# Patient Record
Sex: Female | Born: 1955 | Race: Black or African American | Hispanic: No | Smoking: Never smoker
Health system: Southern US, Community
[De-identification: ages and names within clinical notes are randomized; demographics above are authoritative.]

## PROBLEM LIST (undated history)

## (undated) DIAGNOSIS — R0600 Dyspnea, unspecified: Secondary | ICD-10-CM

## (undated) DIAGNOSIS — G9341 Metabolic encephalopathy: Secondary | ICD-10-CM

## (undated) DIAGNOSIS — I10 Essential (primary) hypertension: Secondary | ICD-10-CM

## (undated) DIAGNOSIS — R7989 Other specified abnormal findings of blood chemistry: Secondary | ICD-10-CM

## (undated) DIAGNOSIS — H269 Unspecified cataract: Secondary | ICD-10-CM

## (undated) DIAGNOSIS — J189 Pneumonia, unspecified organism: Secondary | ICD-10-CM

## (undated) DIAGNOSIS — E872 Acidosis, unspecified: Secondary | ICD-10-CM

## (undated) DIAGNOSIS — K219 Gastro-esophageal reflux disease without esophagitis: Secondary | ICD-10-CM

## (undated) DIAGNOSIS — F419 Anxiety disorder, unspecified: Secondary | ICD-10-CM

## (undated) DIAGNOSIS — R778 Other specified abnormalities of plasma proteins: Secondary | ICD-10-CM

## (undated) DIAGNOSIS — M1990 Unspecified osteoarthritis, unspecified site: Secondary | ICD-10-CM

## (undated) DIAGNOSIS — R195 Other fecal abnormalities: Secondary | ICD-10-CM

## (undated) DIAGNOSIS — I2699 Other pulmonary embolism without acute cor pulmonale: Secondary | ICD-10-CM

## (undated) DIAGNOSIS — E785 Hyperlipidemia, unspecified: Secondary | ICD-10-CM

## (undated) DIAGNOSIS — Z923 Personal history of irradiation: Secondary | ICD-10-CM

## (undated) DIAGNOSIS — I82419 Acute embolism and thrombosis of unspecified femoral vein: Secondary | ICD-10-CM

## (undated) HISTORY — PX: TUBAL LIGATION: SHX77

## (undated) HISTORY — PX: BREAST SURGERY: SHX581

## (undated) HISTORY — DX: Hyperlipidemia, unspecified: E78.5

## (undated) HISTORY — PX: TONSILLECTOMY: SUR1361

## (undated) HISTORY — PX: BREAST CYST EXCISION: SHX579

## (undated) HISTORY — DX: Unspecified osteoarthritis, unspecified site: M19.90

## (undated) HISTORY — DX: Other fecal abnormalities: R19.5

## (undated) HISTORY — DX: Anxiety disorder, unspecified: F41.9

## (undated) HISTORY — DX: Essential (primary) hypertension: I10

## (undated) HISTORY — DX: Gastro-esophageal reflux disease without esophagitis: K21.9

## (undated) HISTORY — PX: OTHER SURGICAL HISTORY: SHX169

---

## 1999-06-28 ENCOUNTER — Emergency Department (HOSPITAL_COMMUNITY): Admission: EM | Admit: 1999-06-28 | Discharge: 1999-06-28 | Payer: Self-pay | Admitting: Emergency Medicine

## 2000-07-11 ENCOUNTER — Emergency Department (HOSPITAL_COMMUNITY): Admission: EM | Admit: 2000-07-11 | Discharge: 2000-07-11 | Payer: Self-pay | Admitting: Emergency Medicine

## 2004-02-03 ENCOUNTER — Emergency Department (HOSPITAL_COMMUNITY): Admission: EM | Admit: 2004-02-03 | Discharge: 2004-02-03 | Payer: Self-pay | Admitting: Emergency Medicine

## 2004-02-03 IMAGING — CR DG CHEST 2V
2 series · 2 of 2 positions shown · non-contrast
Comparison: none

CLINICAL DATA: Tachycardia, lightheadedness. 
 TWO VIEW CHEST
 PA and lateral views of the chest show the heart, lungs, bony thorax and soft tissues to be within the normal limits.  There is minimal left apical pleural thickening.  
 IMPRESSION
 No evidence of acute disease.

[view not recorded (1 of 2)]
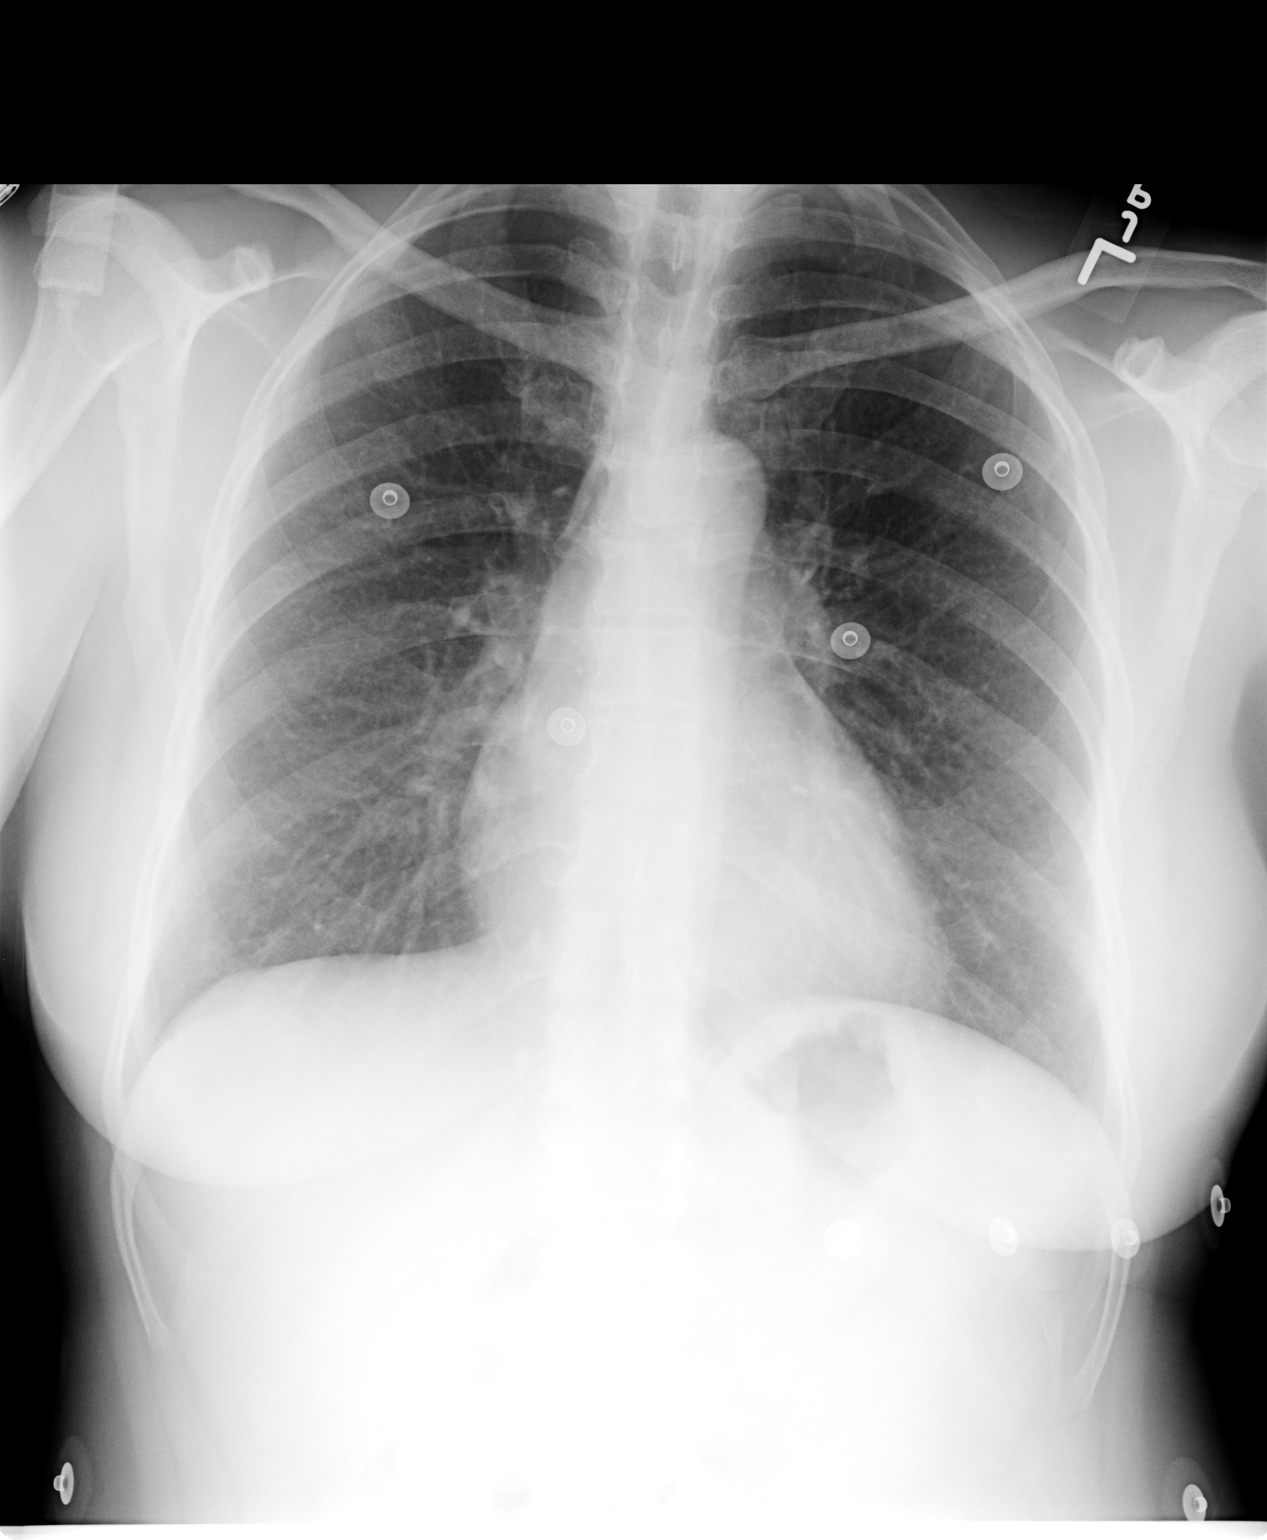

[view not recorded (2 of 2)]
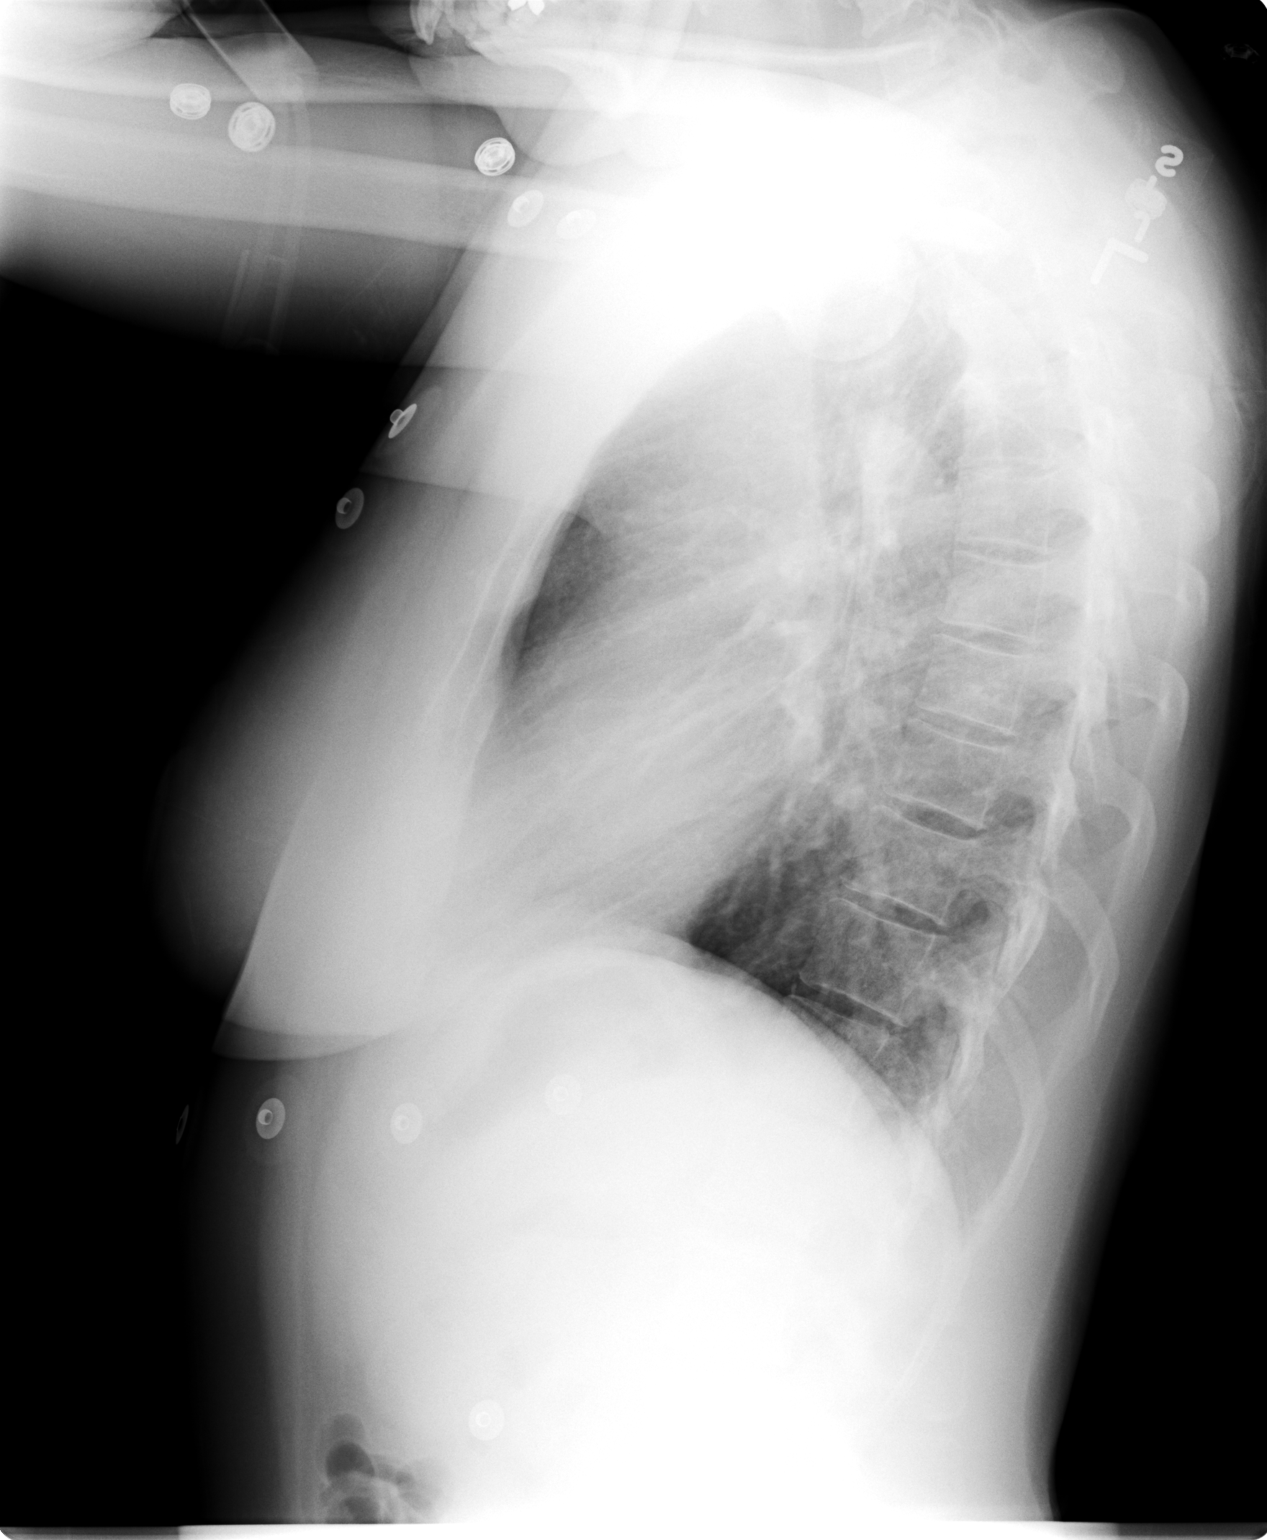

[2 of 2 positions shown; findings below may reference images not displayed]

## 2004-02-03 IMAGING — CT CT CHEST W/ CM
2 of 4 series · 14 of 30 positions shown, 16 images · IV contrast (agent unspecified)
Comparison: Chest radiographs earlier today.

CLINICAL DATA: Chest pain, sweating, nausea, and tachycardia.  
 CHEST CT WITH CONTRAST

[Series 3: *don't forget:recon (date) offon · axial · 0.78mm/px · z∈[-494,-30]mm · 6 of 137 slices shown, 8 images (1 of 2)]
[im 15/137  mediastinal]
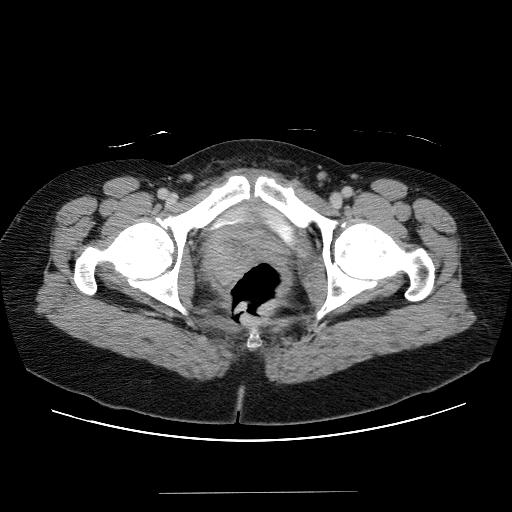
[im 15/137  lung]
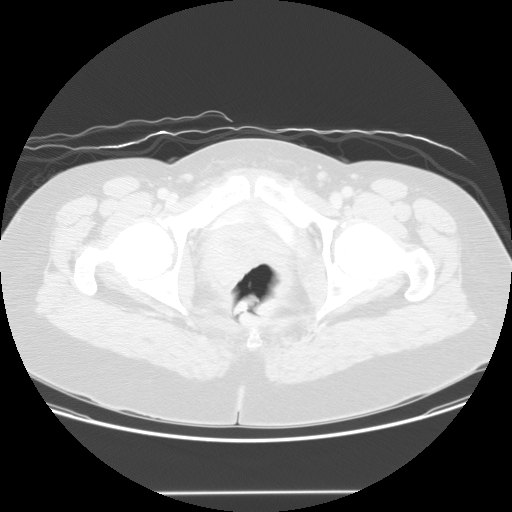
[im 23/137  lung]
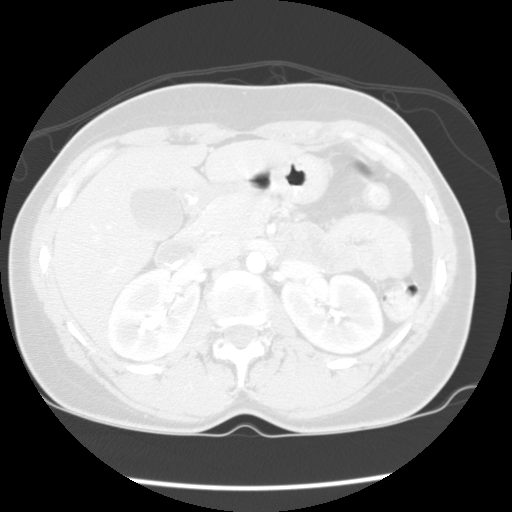
[im 46/137  lung]
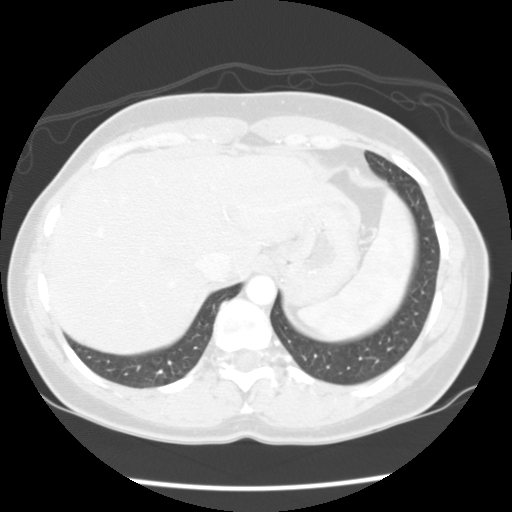
[im 69/137  lung]
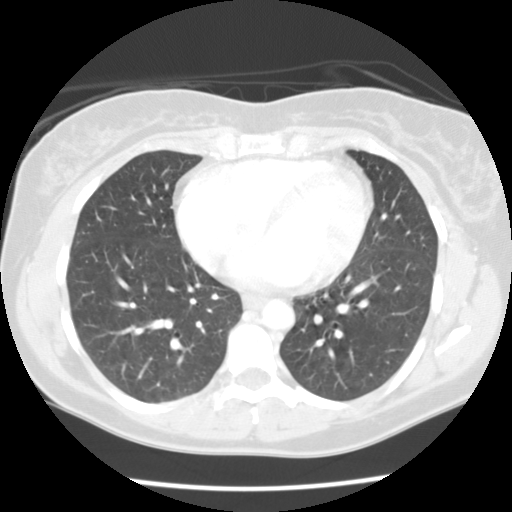
[im 91/137  mediastinal]
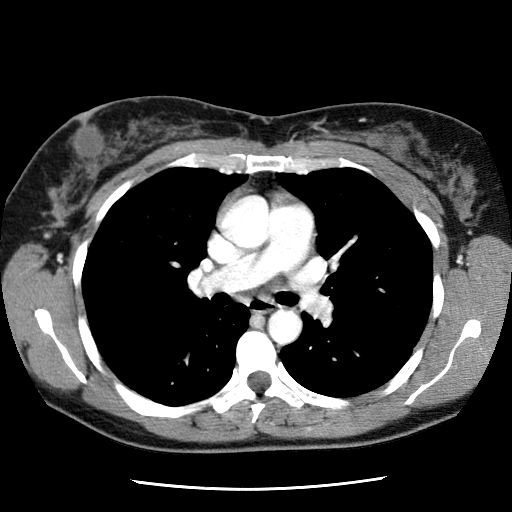
[im 91/137  lung]
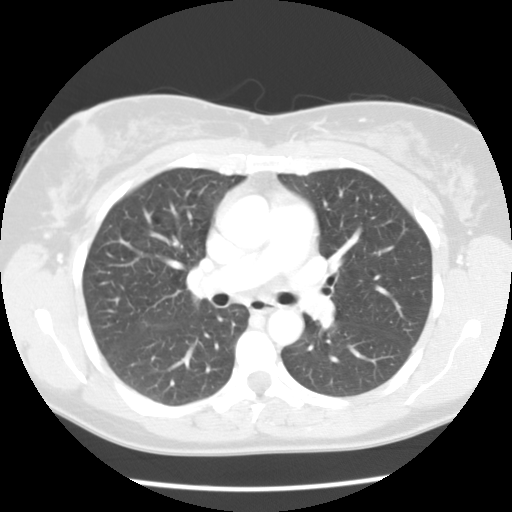
[im 114/137  lung]
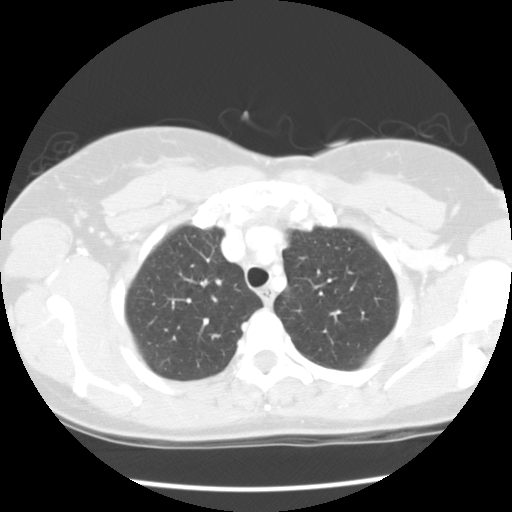

[Series 103: *don't forget:recon (date) offon · axial · 0.62mm/px · z∈[-232,-8]mm · 8 of 353 slices shown (2 of 2)]
[im 42/353  lung]
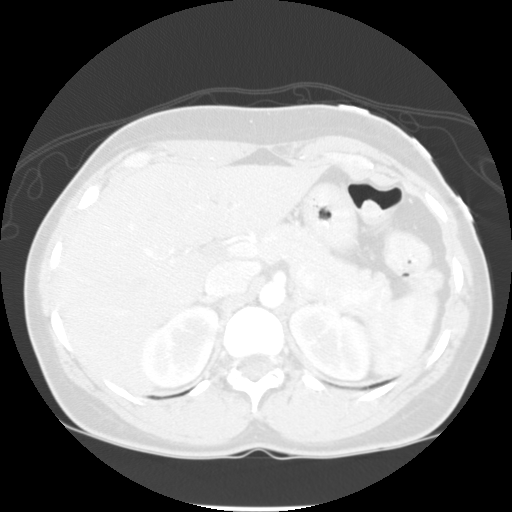
[im 83/353  lung]
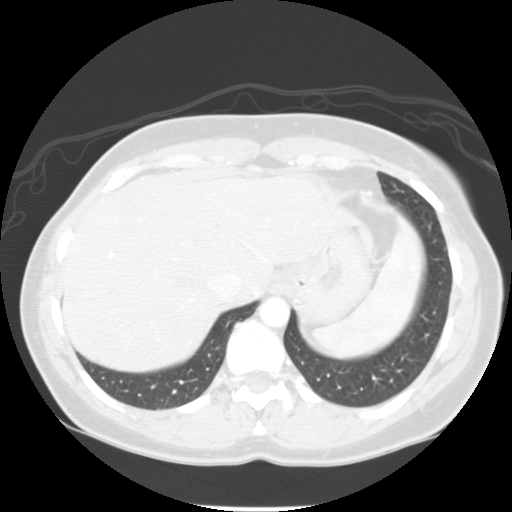
[im 125/353  lung]
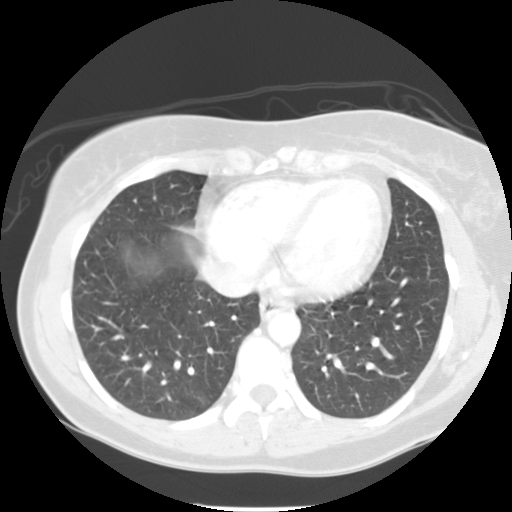
[im 166/353  lung]
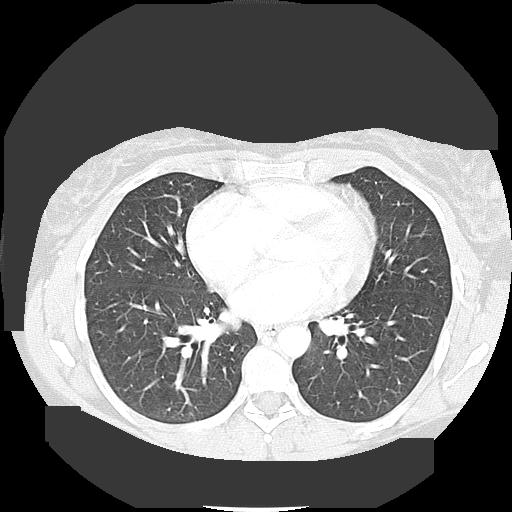
[im 187/353  lung]
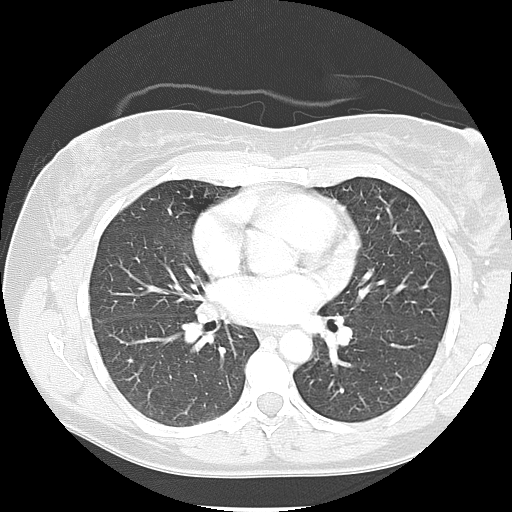
[im 228/353  lung]
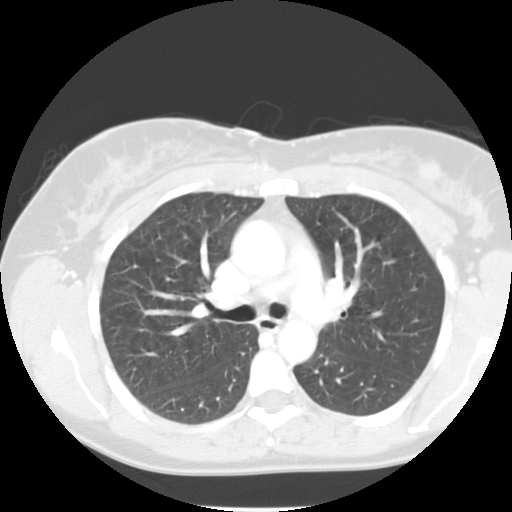
[im 270/353  lung]
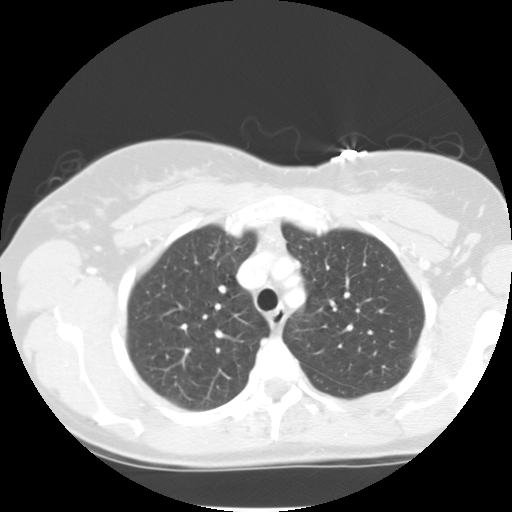
[im 311/353  lung]
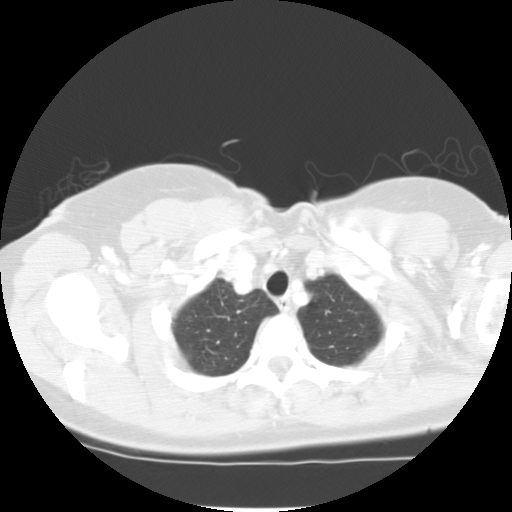

[14 of 30 positions shown; findings below may reference images not displayed]

A pulmonary embolism protocol was requested and performed with 150 cc of Omnipaque 300 intravenous contrast.  The pulmonary arteries are normally opacified with no pulmonary arterial filling defects seen.  Minimal diffuse accentuation of the interstitial markings.  Multiple rounded and oval masses in both breasts.  Some of these appear to represent cysts and others appear to represent solid, enhancing masses.  The largest on the left is in the upper inner aspect of the breast and measures 2.3 cm in maximum diameter.  The largest on the right is in a lateral retroareolar location and measures 2.5 cm in maximum diameter.  Mildly enlarged and mildly inhomogeneous thyroid gland containing a 1.0 cm oval low density area on the left.  Unremarkable bones.  No enlarged lymph nodes. 
 IMPRESSION 
 1.  No pulmonary emboli seen. 
 2.  Mild chronic interstitial lung disease. 
 3.  Multiple cystic and solid masses in both breasts.  Mammographic and ultrasound correlation is necessary. 
 4.  Mild multinodular goiter. 
 BILATERAL LIMITED   LOWER EXTREMITY CT WITH CONTRAST 
 Following the chest CT, representative transaxial images were obtained from below the knees to the iliac crests.  The visualized portions of the deep veins of the legs and pelvis are normally opacified.  No deep venous filling defects are seen.  A small amount of free peritoneal fluid is noted.  
 IMPRESSION
 1.  No deep venous thrombi seen. 
 2.  Small amount of free peritoneal fluid.

## 2008-03-12 ENCOUNTER — Other Ambulatory Visit: Admission: RE | Admit: 2008-03-12 | Discharge: 2008-03-12 | Payer: Self-pay | Admitting: Gynecology

## 2008-04-20 ENCOUNTER — Encounter (INDEPENDENT_AMBULATORY_CARE_PROVIDER_SITE_OTHER): Payer: Self-pay | Admitting: Obstetrics and Gynecology

## 2008-04-20 ENCOUNTER — Ambulatory Visit (HOSPITAL_COMMUNITY): Admission: RE | Admit: 2008-04-20 | Discharge: 2008-04-20 | Payer: Self-pay | Admitting: Obstetrics and Gynecology

## 2011-03-06 NOTE — Op Note (Signed)
NAME:  Cassandra Brennan, Cassandra Brennan NO.:  [id number]   MEDICAL RECORD NO.:  [id number]          PATIENT TYPE:  AMB   LOCATION:  [facility]                           FACILITY:  [facility]   PHYSICIAN:  Miguel Aschoff, M.D.       DATE OF BIRTH:  October 15, 1956   DATE OF PROCEDURE:  04/20/2008  DATE OF DISCHARGE:                               OPERATIVE REPORT   PREOPERATIVE DIAGNOSES:  1. Prolapsing cervical mass.  2. Menorrhagia.   POSTOPERATIVE DIAGNOSES:  1. Prolapsing cervical mass.  2. Menorrhagia.   PROCEDURES:  1. Excision of cervical mass.  2. Cervical dilatation.  3. Hysteroscopy.  4. Uterine curettage.   SURGEON:  Miguel Aschoff, MD   ANESTHESIA:  General.   COMPLICATIONS:  None.   JUSTIFICATION:  The patient is a 55 year old black female noted to have  a large mass protruding through the cervix.  On an examination, this  mass was readily evident and was approximately 4 cm in size and  represented a large cervical polyp versus a prolapsing fibroid.  In  addition, the patient has reported very heavy menses.  Because of  prolapsing mass and heavy menses, she presents now to undergo excision  of the cervical mass as well as hysteroscopy and D&C.  The risks and  benefits of the procedures were discussed with the patient.   PROCEDURE:  The patient was taken to the operating room, placed in  supine position.  General anesthesia was administered without  difficulty.  She was then placed in dorsal lithotomy position, prepped  and draped in a usual sterile fashion.  Examination under anesthesia  revealed normal external genitalia, normal Bartholin Skene's glands,  vaginal vault revealed moderate cystocele, moderate rectocele, no  vaginal pathology was noted.  Coming through the cervix, there was a  polypoid mass again approximately 4 cm in size with the stalk being  approximately 6 cm from the base to the focus of this polypoid mass.  This mass was grasped. Then using electrocautery the  base of the mass  was transected and then cauterized.  After this was done, the  hysteroscope was placed  through the endocervical canal.  The  endometrial cavity was inspected.  No other endometrial lesions were  noted.  No polyps or submucous myomas were noted in the endometrial  cavity.  Small portion of the base of this polypoid mass was still  present, however at this point, vigorous sharp curettage was carried out  using a serrated curette and this was continued until a repeat  hysteroscopy revealed that the base of the polypoid mass had been  removed.  At this point, the procedure was completed.  The cervix was  injected with 18 mL of 1% Xylocaine by placing equal amount at the 12.6  o'clock positions on the cervix for analgesia.  The the patient reversed  from the anesthetic and taken to recovery room in satisfactory  condition.  The estimated blood loss was approximately 60 mL.  The  patient tolerated the procedure well.  Fluid deficit was 40 mL.   Plan is  for the patient to be discharged home.  Medications for home  include Darvocet N 100 every 4 hours as needed for pain.  Place nothing  in vagina.  To call for any problems such as fever, pain or heavy  bleeding.  She is to call for pathology report in 2 days and she is  instructed to refrain from placing anything into the vagina for 2 weeks.      Miguel Aschoff, M.D.  Electronically Signed     AR/MEDQ  D:  04/20/2008  T:  04/21/2008  Job:  [id number]

## 2011-07-19 LAB — BASIC METABOLIC PANEL
BUN: 7
Chloride: 107
GFR calc non Af Amer: >60
Glucose, Bld: 112 — ABNORMAL HIGH
Potassium: 3.4 — ABNORMAL LOW

## 2011-07-19 LAB — CBC
HCT: 39.8
MCV: 92.2
Platelets: 266
RDW: 13.8

## 2015-09-29 ENCOUNTER — Emergency Department (HOSPITAL_COMMUNITY): Payer: PRIVATE HEALTH INSURANCE

## 2015-09-29 ENCOUNTER — Encounter (HOSPITAL_COMMUNITY): Payer: Self-pay | Admitting: Emergency Medicine

## 2015-09-29 ENCOUNTER — Emergency Department (HOSPITAL_COMMUNITY)
Admission: EM | Admit: 2015-09-29 | Discharge: 2015-09-29 | Disposition: A | Discharge status: Home / Self Care | Payer: PRIVATE HEALTH INSURANCE | Attending: Emergency Medicine | Admitting: Emergency Medicine

## 2015-09-29 DIAGNOSIS — N390 Urinary tract infection, site not specified: Secondary | ICD-10-CM | POA: Insufficient documentation

## 2015-09-29 DIAGNOSIS — R109 Unspecified abdominal pain: Secondary | ICD-10-CM | POA: Diagnosis present

## 2015-09-29 DIAGNOSIS — Z79899 Other long term (current) drug therapy: Secondary | ICD-10-CM | POA: Insufficient documentation

## 2015-09-29 LAB — URINALYSIS, ROUTINE W REFLEX MICROSCOPIC
BILIRUBIN URINE: NEGATIVE
GLUCOSE, UA: NEGATIVE mg/dL
HGB URINE DIPSTICK: NEGATIVE
Ketones, ur: NEGATIVE mg/dL
Nitrite: POSITIVE — AB
PROTEIN: NEGATIVE mg/dL
Specific Gravity, Urine: 1.003 — ABNORMAL LOW (ref 1.005–1.030)
pH: 5.5 (ref 5.0–8.0)

## 2015-09-29 LAB — BASIC METABOLIC PANEL
Anion gap: 9 (ref 5–15)
BUN: 10 mg/dL (ref 6–20)
CALCIUM: 9.4 mg/dL (ref 8.9–10.3)
CO2: 26 mmol/L (ref 22–32)
CREATININE: 0.88 mg/dL (ref 0.44–1.00)
Chloride: 103 mmol/L (ref 101–111)
GFR calc Af Amer: >60 mL/min (ref >60)
GLUCOSE: 148 mg/dL — AB (ref 65–99)
POTASSIUM: 3.4 mmol/L — AB (ref 3.5–5.1)
SODIUM: 138 mmol/L (ref 135–145)

## 2015-09-29 LAB — CBC
HEMATOCRIT: 38 % (ref 36.0–46.0)
Hemoglobin: 12.9 g/dL (ref 12.0–15.0)
MCH: 29.1 pg (ref 26.0–34.0)
MCHC: 33.9 g/dL (ref 30.0–36.0)
MCV: 85.6 fL (ref 78.0–100.0)
PLATELETS: 293 10*3/uL (ref 150–400)
RBC: 4.44 MIL/uL (ref 3.87–5.11)
RDW: 13.7 % (ref 11.5–15.5)
WBC: 12.7 10*3/uL — AB (ref 4.0–10.5)

## 2015-09-29 LAB — URINE MICROSCOPIC-ADD ON: RBC / HPF: NONE SEEN RBC/hpf (ref 0–5)

## 2015-09-29 LAB — I-STAT TROPONIN, ED: Troponin i, poc: 0.01 ng/mL (ref 0.00–0.08)

## 2015-09-29 LAB — T4, FREE: Free T4: 0.71 ng/dL (ref 0.61–1.12)

## 2015-09-29 LAB — CBG MONITORING, ED: GLUCOSE-CAPILLARY: 125 mg/dL — AB (ref 65–99)

## 2015-09-29 LAB — TSH: TSH: 0.696 u[IU]/mL (ref 0.350–4.500)

## 2015-09-29 IMAGING — CR DG CHEST 2V
2 series · 2 of 2 positions shown · non-contrast
Comparison: [DATE]

CLINICAL DATA: Palpitations

EXAM:
CHEST  2 VIEW

[chest pa]
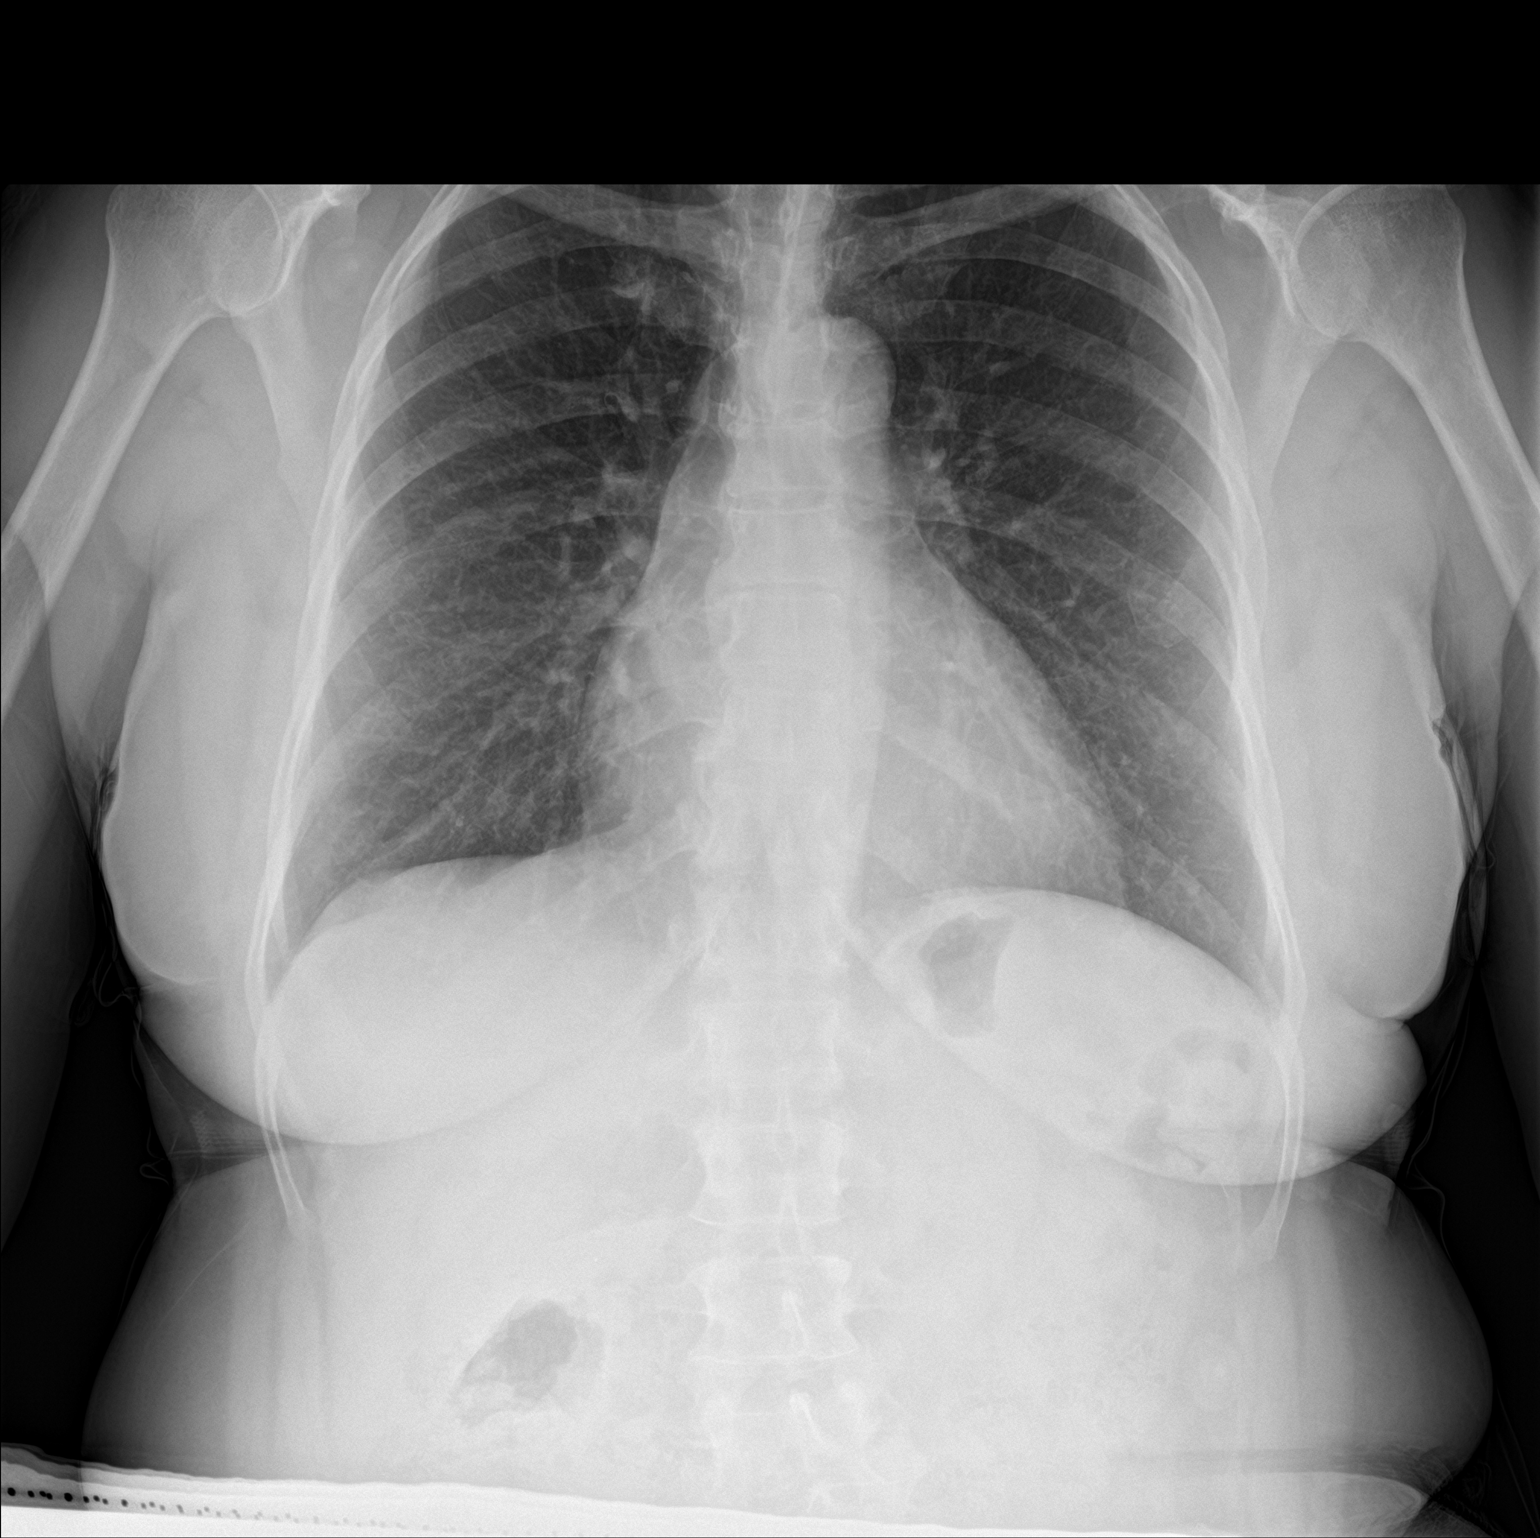

[chest lat]
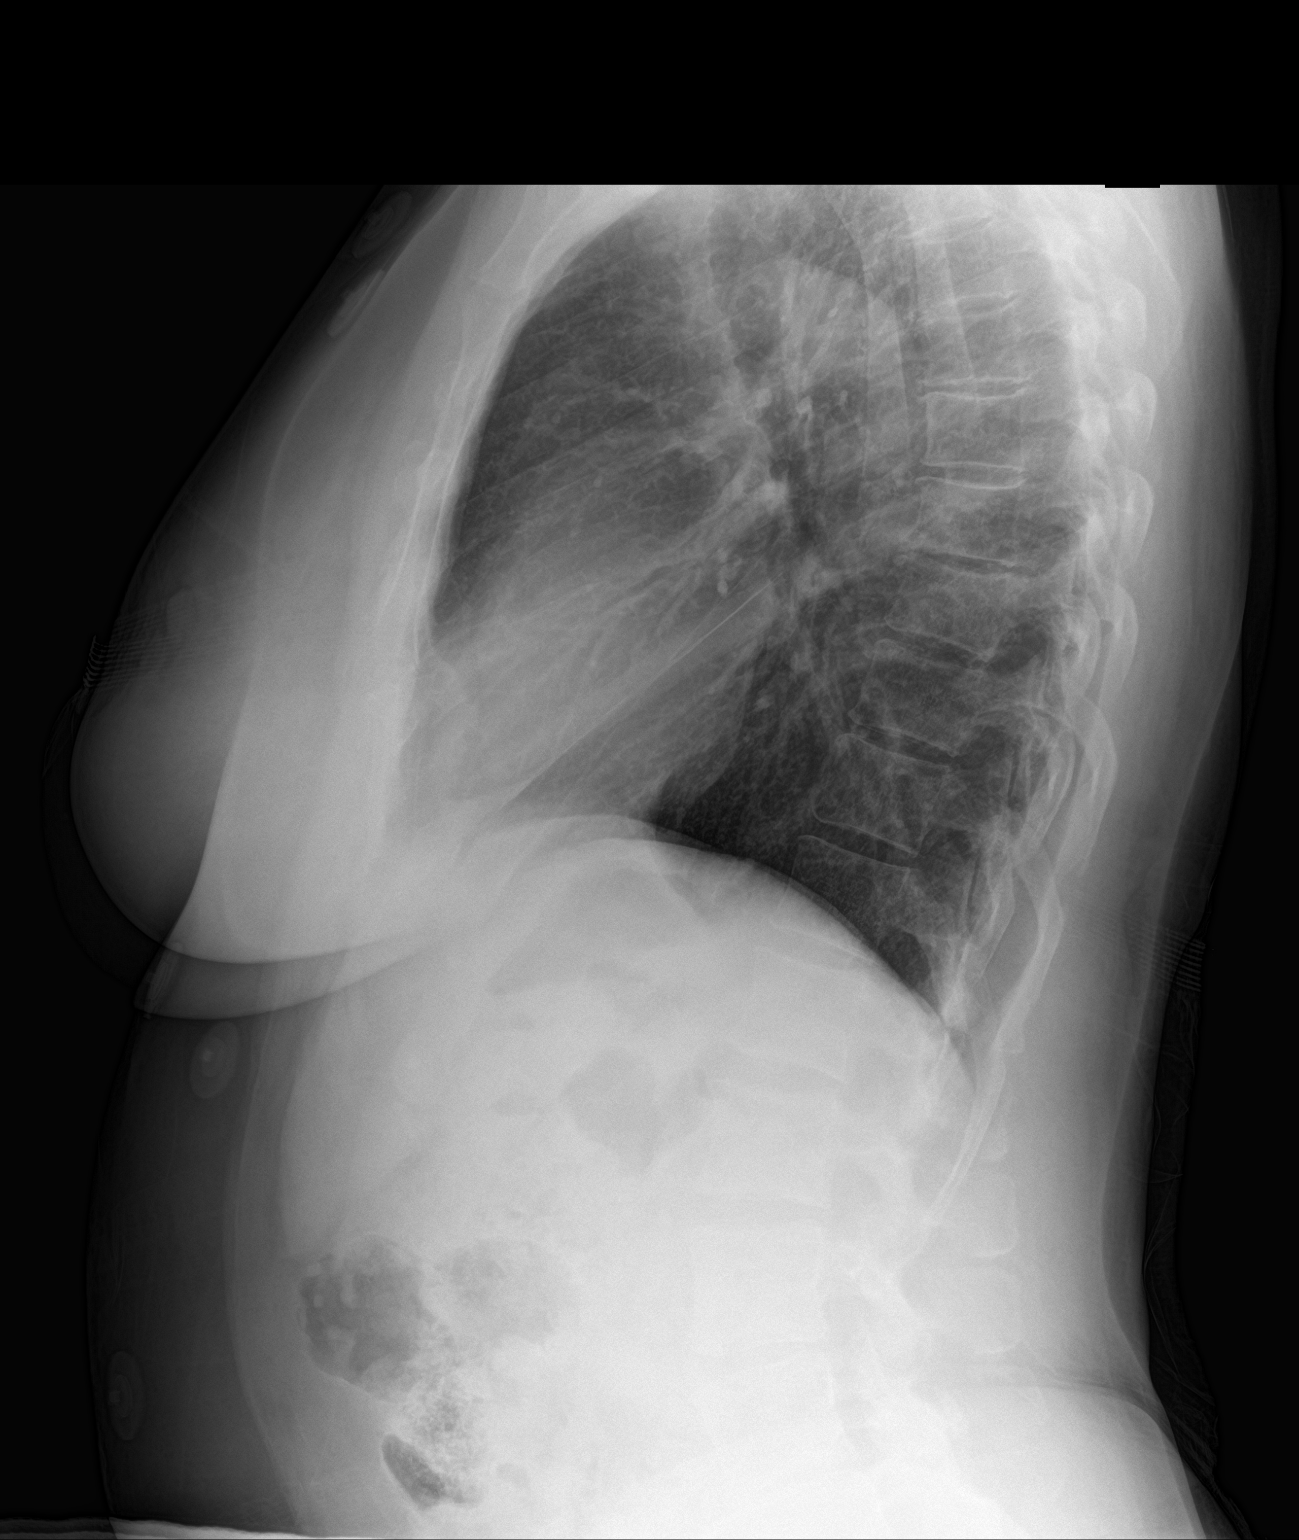

[2 of 2 positions shown; findings below may reference images not displayed]

FINDINGS: Normal heart size and mediastinal contours. No acute infiltrate or
edema. No effusion or pneumothorax. No acute osseous findings.
IMPRESSION: Negative chest.

## 2015-09-29 MED ORDER — DEXTROSE 5 % IV SOLN
1.0000 g | Freq: Once | INTRAVENOUS | Status: AC
  Administered 2015-09-29: 1 g via INTRAVENOUS
  Filled 2015-09-29: qty 10

## 2015-09-29 MED ORDER — ACETAMINOPHEN 500 MG PO TABS
1000.0000 mg | ORAL_TABLET | Freq: Once | ORAL | Status: AC
  Administered 2015-09-29: 1000 mg via ORAL
  Filled 2015-09-29: qty 2

## 2015-09-29 MED ORDER — POTASSIUM CHLORIDE CRYS ER 20 MEQ PO TBCR
40.0000 meq | EXTENDED_RELEASE_TABLET | Freq: Once | ORAL | Status: AC
  Administered 2015-09-29: 40 meq via ORAL
  Filled 2015-09-29: qty 2

## 2015-09-29 MED ORDER — SODIUM CHLORIDE 0.9 % IV BOLUS (SEPSIS)
1000.0000 mL | Freq: Once | INTRAVENOUS | Status: AC
  Administered 2015-09-29: 1000 mL via INTRAVENOUS

## 2015-09-29 MED ORDER — CEPHALEXIN 500 MG PO CAPS: 500.0000 mg | ORAL_CAPSULE | Freq: Four times a day (QID) | ORAL | Status: AC

## 2015-09-29 MED ORDER — ONDANSETRON 4 MG PO TBDP: 4.0000 mg | ORAL_TABLET | Freq: Three times a day (TID) | ORAL | Status: DC | PRN

## 2015-09-29 NOTE — ED Provider Notes (Signed)
CSN: [id number]     Arrival date & time 09/29/15  1302 History   First MD Initiated Contact with Patient 09/29/15 1408     Chief Complaint  Patient presents with  . Palpitations     (Consider location/radiation/quality/duration/timing/severity/associated sxs/prior Treatment) HPI Comments: 11AM this AM started having palpitations Off and on for one week Tries to relax then improves No CP/SOB Once/day Smoothie this AM, supplement?, had symptoms last week however no smoothie or supplements then No caffeine No over the counter meds Hx of similar palpitaitions 51yr ago, told it was anxiety Urinary symptoms this week, no flank pain, no n/v No family hx Son has DM    Patient is a 59 y.o. female presenting with palpitations.  Palpitations Associated symptoms: no back pain, no chest pain, no cough, no nausea, no numbness, no shortness of breath, no vomiting and no weakness     History reviewed. No pertinent past medical history. Past Surgical History  Procedure Laterality Date  . Tubal ligation    . Breast surgery    . Tonsillectomy     No family history on file. Social History  Substance Use Topics  . Smoking status: Never Smoker   . Smokeless tobacco: None  . Alcohol Use: No   OB History    No data available     Review of Systems  Constitutional: Negative for fever, appetite change and fatigue.  HENT: Negative for sore throat.   Eyes: Negative for visual disturbance.  Respiratory: Negative for cough and shortness of breath.   Cardiovascular: Positive for palpitations. Negative for chest pain and leg swelling.  Gastrointestinal: Positive for abdominal pain (suprapubic). Negative for nausea, vomiting, diarrhea and blood in stool.  Genitourinary: Positive for dysuria. Negative for difficulty urinating.  Musculoskeletal: Negative for back pain.  Skin: Negative for rash.  Neurological: Negative for syncope, weakness, numbness and headaches.      Allergies   Robitussin (alcohol free)  Home Medications   Prior to Admission medications   Medication Sig Start Date End Date Taking? Authorizing Provider  Multiple Vitamin (MULTIVITAMIN WITH MINERALS) TABS tablet Take 1 tablet by mouth daily.   Yes Historical Provider, MD  cephALEXin (KEFLEX) 500 MG capsule Take 1 capsule (500 mg total) by mouth 4 (four) times daily. 09/29/15 10/06/15  Gareth Morgan, MD  ondansetron (ZOFRAN ODT) 4 MG disintegrating tablet Take 1 tablet (4 mg total) by mouth every 8 (eight) hours as needed for nausea or vomiting. 09/29/15   Gareth Morgan, MD   BP 142/81 mmHg  Pulse 113  Temp(Src) 98.3 F (36.8 C) (Oral)  Resp 11  Ht 5\' 5"  (1.651 m)  Wt 170 lb (77.111 kg)  BMI 28.29 kg/m2  SpO2 100% Physical Exam  Constitutional: She is oriented to person, place, and time. She appears well-developed and well-nourished. No distress.  HENT:  Head: Normocephalic and atraumatic.  Eyes: Conjunctivae and EOM are normal.  Neck: Normal range of motion.  Cardiovascular: Normal rate, regular rhythm, normal heart sounds and intact distal pulses.  Exam reveals no gallop and no friction rub.   No murmur heard. Pulmonary/Chest: Effort normal and breath sounds normal. No respiratory distress. She has no wheezes. She has no rales.  Abdominal: Soft. She exhibits no distension. There is no tenderness. There is no guarding and no CVA tenderness.  Musculoskeletal: She exhibits no edema or tenderness.  Neurological: She is alert and oriented to person, place, and time. She has normal strength. No cranial nerve deficit or sensory deficit. Coordination  normal. GCS eye subscore is 4. GCS verbal subscore is 5. GCS motor subscore is 6.  Skin: Skin is warm and dry. No rash noted. She is not diaphoretic. No erythema.  Nursing note and vitals reviewed.   ED Course  Procedures (including critical care time) Labs Review Labs Reviewed  BASIC METABOLIC PANEL - Abnormal; Notable for the following:     Potassium 3.4 (*)    Glucose, Bld 148 (*)    All other components within normal limits  CBC - Abnormal; Notable for the following:    WBC 12.7 (*)    All other components within normal limits  URINALYSIS, ROUTINE W REFLEX MICROSCOPIC (NOT AT [facility]) - Abnormal; Notable for the following:    APPearance CLOUDY (*)    Specific Gravity, Urine 1.003 (*)    Nitrite POSITIVE (*)    Leukocytes, UA SMALL (*)    All other components within normal limits  URINE MICROSCOPIC-ADD ON - Abnormal; Notable for the following:    Squamous Epithelial / LPF 0-5 (*)    Bacteria, UA MANY (*)    All other components within normal limits  CBG MONITORING, ED - Abnormal; Notable for the following:    Glucose-Capillary 125 (*)    All other components within normal limits  TSH  T4, FREE  I-STAT TROPOININ, ED    Imaging Review Dg Chest 2 View  09/29/2015  CLINICAL DATA:  Palpitations EXAM: CHEST  2 VIEW COMPARISON:  02/03/2004 FINDINGS: Normal heart size and mediastinal contours. No acute infiltrate or edema. No effusion or pneumothorax. No acute osseous findings. IMPRESSION: Negative chest. Electronically Signed   By: Monte Fantasia M.D.   On: 09/29/2015 14:24   I have personally reviewed and evaluated these images and lab results as part of my medical decision-making.   EKG Interpretation   Date/Time:  Thursday September 29 2015 13:14:13 EST Ventricular Rate:  122 PR Interval:  158 QRS Duration: 78 QT Interval:  324 QTC Calculation: 461 R Axis:   44 Text Interpretation:  Sinus tachycardia Biatrial enlargement Diffuse ST  changes, likely rate related Abnormal ECG Confirmed by [facility] MD, [place]  ([id number]) on 09/29/2015 2:24:15 PM      MDM   Final diagnoses:  UTI (lower urinary tract infection)   59yo female with no significant medical history presents with concern for palpitations.   EKG evaluated by me and shows sinus rhythm with no sign of prolonged QTc, no brugada, no sign of HOCM. Pt with sinus  tachycardia, rate related st changes.  No CP/SOB and low suspicion for PE. No anemia or significant electrolyte abnormalities.  Pt with urinary symptoms and signs of UTI.  Tachycardia likely secondary to infection/\dehydration.  HR improved from 120s to 105 prior to discharge.  Pt well appearing, able to ambulate to bathroom without significant symptoms, normal BP, no vomiting, no fever, appropriate for outpt care for UTI, likely cause of palpitations.  HR improved in ED.  Gave rx for keflex/zofran and recommend close outp tfollow up.    Gareth Morgan, MD 09/29/15 2337

## 2015-09-29 NOTE — ED Notes (Signed)
Pt reports that she has been experiencing palpations and generalized weakness intermittently x 1 week. Pt denies CP. Pt alert x4. NAD at this time.

## 2015-12-22 ENCOUNTER — Ambulatory Visit: Payer: PRIVATE HEALTH INSURANCE | Admitting: Internal Medicine

## 2016-03-11 ENCOUNTER — Emergency Department (HOSPITAL_COMMUNITY)
Admission: EM | Admit: 2016-03-11 | Discharge: 2016-03-11 | Disposition: A | Discharge status: Home / Self Care | Payer: PRIVATE HEALTH INSURANCE | Attending: Emergency Medicine | Admitting: Emergency Medicine

## 2016-03-11 ENCOUNTER — Encounter (HOSPITAL_COMMUNITY): Payer: Self-pay | Admitting: Family Medicine

## 2016-03-11 DIAGNOSIS — I10 Essential (primary) hypertension: Secondary | ICD-10-CM | POA: Diagnosis not present

## 2016-03-11 DIAGNOSIS — Z79899 Other long term (current) drug therapy: Secondary | ICD-10-CM | POA: Insufficient documentation

## 2016-03-11 DIAGNOSIS — F419 Anxiety disorder, unspecified: Secondary | ICD-10-CM | POA: Insufficient documentation

## 2016-03-11 HISTORY — DX: Essential (primary) hypertension: I10

## 2016-03-11 NOTE — ED Notes (Signed)
Pt here for hypertension. Pt sts she was doing her regular BP check this am and was  Very elevated at 180/105. sts made her anxious and she took a xanax because her hear rate was up. denies recent sickness, chest pain, SOB, headache.

## 2016-03-11 NOTE — Discharge Instructions (Signed)
Hypertension Hypertension, commonly called high blood pressure, is when the force of blood pumping through your arteries is too strong. Your arteries are the blood vessels that carry blood from your heart throughout your body. A blood pressure reading consists of a higher number over a lower number, such as 110/72. The higher number (systolic) is the pressure inside your arteries when your heart pumps. The lower number (diastolic) is the pressure inside your arteries when your heart relaxes. Ideally you want your blood pressure below 120/80. Hypertension forces your heart to work harder to pump blood. Your arteries may become narrow or stiff. Having untreated or uncontrolled hypertension can cause heart attack, stroke, kidney disease, and other problems. RISK FACTORS Some risk factors for high blood pressure are controllable. Others are not.  Risk factors you cannot control include:   Race. You may be at higher risk if you are African American.  Age. Risk increases with age.  Gender. Men are at higher risk than women before age 45 years. After age 65, women are at higher risk than men. Risk factors you can control include:  Not getting enough exercise or physical activity.  Being overweight.  Getting too much fat, sugar, calories, or salt in your diet.  Drinking too much alcohol. SIGNS AND SYMPTOMS Hypertension does not usually cause signs or symptoms. Extremely high blood pressure (hypertensive crisis) may cause headache, anxiety, shortness of breath, and nosebleed. DIAGNOSIS To check if you have hypertension, your health care provider will measure your blood pressure while you are seated, with your arm held at the level of your heart. It should be measured at least twice using the same arm. Certain conditions can cause a difference in blood pressure between your right and left arms. A blood pressure reading that is higher than normal on one occasion does not mean that you need treatment. If  it is not clear whether you have high blood pressure, you may be asked to return on a different day to have your blood pressure checked again. Or, you may be asked to monitor your blood pressure at home for 1 or more weeks. TREATMENT Treating high blood pressure includes making lifestyle changes and possibly taking medicine. Living a healthy lifestyle can help lower high blood pressure. You may need to change some of your habits. Lifestyle changes may include:  Following the DASH diet. This diet is high in fruits, vegetables, and whole grains. It is low in salt, red meat, and added sugars.  Keep your sodium intake below 2,300 mg per day.  Getting at least 30-45 minutes of aerobic exercise at least 4 times per week.  Losing weight if necessary.  Not smoking.  Limiting alcoholic beverages.  Learning ways to reduce stress. Your health care provider may prescribe medicine if lifestyle changes are not enough to get your blood pressure under control, and if one of the following is true:  You are 18-59 years of age and your systolic blood pressure is above 140.  You are 60 years of age or older, and your systolic blood pressure is above 150.  Your diastolic blood pressure is above 90.  You have diabetes, and your systolic blood pressure is over 140 or your diastolic blood pressure is over 90.  You have kidney disease and your blood pressure is above 140/90.  You have heart disease and your blood pressure is above 140/90. Your personal target blood pressure may vary depending on your medical conditions, your age, and other factors. HOME CARE INSTRUCTIONS    Have your blood pressure rechecked as directed by your health care provider.   Take medicines only as directed by your health care provider. Follow the directions carefully. Blood pressure medicines must be taken as prescribed. The medicine does not work as well when you skip doses. Skipping doses also puts you at risk for  problems.  Do not smoke.   Monitor your blood pressure at home as directed by your health care provider. SEEK MEDICAL CARE IF:   You think you are having a reaction to medicines taken.  You have recurrent headaches or feel dizzy.  You have swelling in your ankles.  You have trouble with your vision. SEEK IMMEDIATE MEDICAL CARE IF:  You develop a severe headache or confusion.  You have unusual weakness, numbness, or feel faint.  You have severe chest or abdominal pain.  You vomit repeatedly.  You have trouble breathing. MAKE SURE YOU:   Understand these instructions.  Will watch your condition.  Will get help right away if you are not doing well or get worse.   This information is not intended to replace advice given to you by your health care provider. Make sure you discuss any questions you have with your health care provider.   Document Released: 10/08/2005 Document Revised: 02/22/2015 Document Reviewed: 07/31/2013 Elsevier Interactive Patient Education 2016 [employer]  

## 2016-03-11 NOTE — ED Provider Notes (Signed)
CSN: [id number]     Arrival date & time 03/11/16  1123 History   First MD Initiated Contact with Patient 03/11/16 1159     Chief Complaint  Patient presents with  . Hypertension  . Anxiety     (Consider location/radiation/quality/duration/timing/severity/associated sxs/prior Treatment) HPI  60 year old female with a history of hypertension presents today stating that she did a regular blood pressure checked this morning and it was elevated to systolic of [id number]. She realized her blood pressure was as high, she states that she became very anxious and had palpitations. She had RA taken her normal a.m. blood pressure medicines which are propanolol 50 mg ER. She states that she takes her blood pressure every morning and it is usually not this high. She denies any headache, weakness, visual changes, chest pain, or dyspnea. She took 0.25 Xanax which she takes for her anxiety symptoms. She states that she now feels much better with no palpitations. She has not had any changes in her medications, diet, or habits.  Past Medical History  Diagnosis Date  . Hypertension    Past Surgical History  Procedure Laterality Date  . Tubal ligation    . Breast surgery    . Tonsillectomy     History reviewed. No pertinent family history. Social History  Substance Use Topics  . Smoking status: Never Smoker   . Smokeless tobacco: None  . Alcohol Use: No   OB History    No data available     Review of Systems  All other systems reviewed and are negative.     Allergies  Robitussin (alcohol free)  Home Medications   Prior to Admission medications   Medication Sig Start Date End Date Taking? Authorizing Provider  Multiple Vitamin (MULTIVITAMIN WITH MINERALS) TABS tablet Take 1 tablet by mouth daily.    Historical Provider, MD  ondansetron (ZOFRAN ODT) 4 MG disintegrating tablet Take 1 tablet (4 mg total) by mouth every 8 (eight) hours as needed for nausea or vomiting. 09/29/15   Gareth Morgan, MD    BP 158/92 mmHg  Pulse 111  Temp(Src) 98.2 F (36.8 C) (Oral)  Resp 18  SpO2 100% Physical Exam  Constitutional: She is oriented to person, place, and time. She appears well-developed and well-nourished.  HENT:  Head: Normocephalic and atraumatic.  Right Ear: External ear normal.  Left Ear: External ear normal.  Nose: Nose normal.  Mouth/Throat: Oropharynx is clear and moist.  Eyes: Conjunctivae and EOM are normal. Pupils are equal, round, and reactive to light.  Neck: Normal range of motion. Neck supple.  Cardiovascular: Normal rate, regular rhythm, normal heart sounds and intact distal pulses.   Pulmonary/Chest: Effort normal and breath sounds normal.  Abdominal: Soft. Bowel sounds are normal.  Musculoskeletal: Normal range of motion.  Neurological: She is alert and oriented to person, place, and time. She has normal reflexes.  Skin: Skin is warm and dry.  Psychiatric: She has a normal mood and affect. Her behavior is normal. Judgment and thought content normal.  Nursing note and vitals reviewed.   ED Course  Procedures (including critical care time)   MDM   Final diagnoses:  Essential hypertension    60 year old female with hypertension who had systolic blood pressure [id number] at home this morning. On presentation here initial blood pressure 150/92 with a heart rate below 111. On my exam blood pressure 145/88 heart rate is 95 and regular. I have discussed return precautions and need for follow-up. Patient voices understanding.    Andee Poles  Heidie Krall, MD 03/11/16 1241

## 2018-04-04 ENCOUNTER — Ambulatory Visit: Payer: Self-pay

## 2018-04-04 ENCOUNTER — Ambulatory Visit (INDEPENDENT_AMBULATORY_CARE_PROVIDER_SITE_OTHER): Payer: PRIVATE HEALTH INSURANCE | Admitting: Sports Medicine

## 2018-04-04 ENCOUNTER — Ambulatory Visit: Payer: PRIVATE HEALTH INSURANCE | Admitting: Sports Medicine

## 2018-04-04 ENCOUNTER — Encounter: Payer: Self-pay | Admitting: Sports Medicine

## 2018-04-04 VITALS — BP 140/82 | HR 94 | Ht 65.0 in | Wt 173.2 lb

## 2018-04-04 DIAGNOSIS — M25561 Pain in right knee: Secondary | ICD-10-CM | POA: Diagnosis not present

## 2018-04-04 NOTE — Progress Notes (Signed)

## 2018-04-04 NOTE — Progress Notes (Signed)
Cassandra Brennan. [name], [place] [facility] [place] - 62 y.o. female MRN [id number]  Date of birth: 05/23/1956  Visit Date: 04/04/2018  PCP: Patient, No Pcp Per   Referred by: No ref. provider found  Scribe(s) for today's visit: Wendy Poet, LAT, ATC  SUBJECTIVE:  Cassandra Brennan is here for New Patient (Initial Visit) (R knee pain) .    Her R knee pain symptoms INITIALLY: Began a few months ago w/ no specific MOI.  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 Described as mild-mod aching pain, radiating to R proximal lower leg Worsened with walking and standing after prolonged sitting Improved with Salonpas and topical analgesic Additional associated symptoms include: notes occasional mechanical symptoms; no N/T noted in the R LE    At this time symptoms are worsening compared to onset  She has been wearing a knee brace but doesn't feel like the brace is helping.   REVIEW OF SYSTEMS: Denies night time disturbances. Denies fevers, chills, or night sweats. Denies unexplained weight loss. Denies personal history of cancer. Denies changes in bowel or bladder habits. Denies recent unreported falls. Denies new or worsening dyspnea or wheezing. Denies headaches or dizziness.  Denies numbness, tingling or weakness  In the extremities.  Denies dizziness or presyncopal episodes Denies lower extremity edema    HISTORY & PERTINENT PRIOR DATA:  Prior History reviewed and updated per electronic medical record.  Significant/pertinent history, findings, studies include:  reports that she has never smoked. She has never used smokeless tobacco. No results for input(s): HGBA1C, LABURIC, CREATINE in the last 8760 hours. No specialty comments available. No problems updated.  OBJECTIVE:  VS:  HT:5\' 5"  (165.1 cm)   WT:173 lb 3.2 oz (78.6 kg)  BMI:28.82    BP:140/82  HR:94bpm  TEMP: ( )  RESP:98 %   PHYSICAL  EXAM: Constitutional: WDWN, Non-toxic appearing. Psychiatric: Alert & appropriately interactive.  Not depressed or anxious appearing. Respiratory: No increased work of breathing.  Trachea Midline Eyes: Pupils are equal.  EOM intact without nystagmus.  No scleral icterus  Vascular Exam: warm to touch no edema  lower extremity neuro exam: unremarkable normal strength normal sensation  MSK Exam: Right knee is overall well aligned.  She has a slight antalgic gait.  Full range of motion.  Pain with McMurray's localizing to the medial joint line.  No significant pain or crepitation with patellar grind.  Hip abduction strength is diminished at 4 out of 5 with a positive J sign for patellar tracking.   ASSESSMENT & PLAN:   1. Right knee pain, unspecified chronicity     PLAN: We will go ahead and inject the knee per procedure note and have them begin on hip and knee strenghtening exersises. Additionally we discussed the merits of compression and/or bracing and recommend prophylatic compression with activity.  Icing discussed PRN.  If persistent ongoing symptoms can consider repeat injections and/or further advanced diagnostic imaging.  Discussed the foundation of treatment for this condition is physical therapy and/or daily (5-6 days/week) therapeutic exercises, focusing on core strengthening, coordination, neuromuscular control/reeducation.  Therapeutic exercises prescribed per procedure note.  Follow-up: Return in about 6 weeks (around 05/16/2018).        Please see additional documentation for Objective, Assessment and Plan sections. Pertinent additional documentation may be included in corresponding procedure notes, imaging studies, problem based documentation and patient instructions. Please see these sections of  the encounter for additional information regarding this visit.  CMA/ATC served as Education administrator during this visit. History, Physical, and Plan performed by medical provider.  Documentation and orders reviewed and attested to.      Gerda Diss, [facility] [place] Physician

## 2018-04-04 NOTE — Procedures (Signed)
PROCEDURE NOTE:  Ultrasound Guided: Injection: Right knee Images were obtained and interpreted by myself, Teresa Coombs, DO  Images have been saved and stored to PACS system. Images obtained on: GE S7 Ultrasound machine    ULTRASOUND FINDINGS:  Mild amount of synovitis, no significant effusion.  Mild degenerative changes.  DESCRIPTION OF PROCEDURE:  The patient's clinical condition is marked by substantial pain and/or significant functional disability. Other conservative therapy has not provided relief, is contraindicated, or not appropriate. There is a reasonable likelihood that injection will significantly improve the patient's pain and/or functional impairment.   After discussing the risks, benefits and expected outcomes of the injection and all questions were reviewed and answered, the patient wished to undergo the above named procedure.  Verbal consent was obtained.  The ultrasound was used to identify the target structure and adjacent neurovascular structures. The skin was then prepped in sterile fashion and the target structure was injected under direct visualization using sterile technique as below:  Single injection performed as below: PREP: Alcohol and Ethel Chloride APPROACH:superiolateral, single injection, 25g 1.5 in. INJECTATE: 2 cc 0.5% Marcaine and 2 cc 40mg /mL DepoMedrol ASPIRATE: None DRESSING: Band-Aid  Post procedural instructions including recommending icing and warning signs for infection were reviewed.    This procedure was well tolerated and there were no complications.   IMPRESSION: Succesful Ultrasound Guided: Injection

## 2018-04-04 NOTE — Patient Instructions (Addendum)

## 2018-04-09 ENCOUNTER — Ambulatory Visit: Payer: Self-pay | Admitting: *Deleted

## 2018-04-09 NOTE — Telephone Encounter (Signed)
   Reason for Disposition . RN needs further essential information from caller in order to complete triage  Answer Assessment - Initial Assessment Questions 1. REASON FOR CALL or QUESTION: "What is your reason for calling today?" or "How can I best help you?" or "What question do you have that I can help answer?"     Patient is calling to report that she had injection in her knee last Friday. She did ice the area after the injection. She went to work the next day- on her feet all day. Patient experienced sweating, increased BP and heart palpitations.  She did not work on Sunday. Patient is calling to report she has some swelling at the injection site. It is a small area- not a lot. Call to office to ask about the amount of time and the swelling present. Patient conferenced to nurse.  Protocols used: INFORMATION ONLY CALL-A-AH

## 2018-04-09 NOTE — Telephone Encounter (Signed)
See note

## 2018-04-09 NOTE — Telephone Encounter (Signed)
Spoke with patient and she advised that she did notice increased swelling at the injection site. 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 She denies fever, increased warmth, redness, or drainage at injection site.   She also reports that on Saturday her BP was elevated, she had increased sweating and palpitations. She reports that BP is now back to normal and other sx have resolved. I advised her that is sounds like a reaction to the steroid.   She would also like to know if it would be beneficial to try wearing TED hose.   Dr. Paulla Fore made verbally aware. He advised that pt should continue to monitor sx and contact the office if she develops fever or redness, significant swelling, increased warmth, or drainage at the injection site. If sx are getting progressively worse from now until Friday she should schedule an appointment for re-evaluation. Otherwise, continue to monitor sx and call back if sx don't improve over the next week.

## 2018-04-09 NOTE — Telephone Encounter (Signed)
Spoke with patient and advised per Dr. Paulla Fore. Went over warning signs of infection again. Pt verbalized understanding.

## 2018-05-07 ENCOUNTER — Encounter: Payer: Self-pay | Admitting: Sports Medicine

## 2018-05-16 ENCOUNTER — Ambulatory Visit (INDEPENDENT_AMBULATORY_CARE_PROVIDER_SITE_OTHER): Payer: PRIVATE HEALTH INSURANCE

## 2018-05-16 ENCOUNTER — Encounter: Payer: Self-pay | Admitting: Sports Medicine

## 2018-05-16 ENCOUNTER — Ambulatory Visit (INDEPENDENT_AMBULATORY_CARE_PROVIDER_SITE_OTHER): Payer: PRIVATE HEALTH INSURANCE | Admitting: Sports Medicine

## 2018-05-16 VITALS — BP 138/84 | HR 102 | Ht 65.0 in | Wt 172.4 lb

## 2018-05-16 DIAGNOSIS — M25561 Pain in right knee: Secondary | ICD-10-CM

## 2018-05-16 DIAGNOSIS — G8929 Other chronic pain: Secondary | ICD-10-CM | POA: Diagnosis not present

## 2018-05-16 IMAGING — DX DG KNEE AP/LAT W/ SUNRISE*R*
3 series · 3 of 3 positions shown · non-contrast
Comparison: None.

CLINICAL DATA: Patient complains of right knee pain X 2 months with
some swelling on the lateral aspect of the right patella, patient
denies injury.

EXAM:
RIGHT KNEE 3 VIEWS

[knee ap]
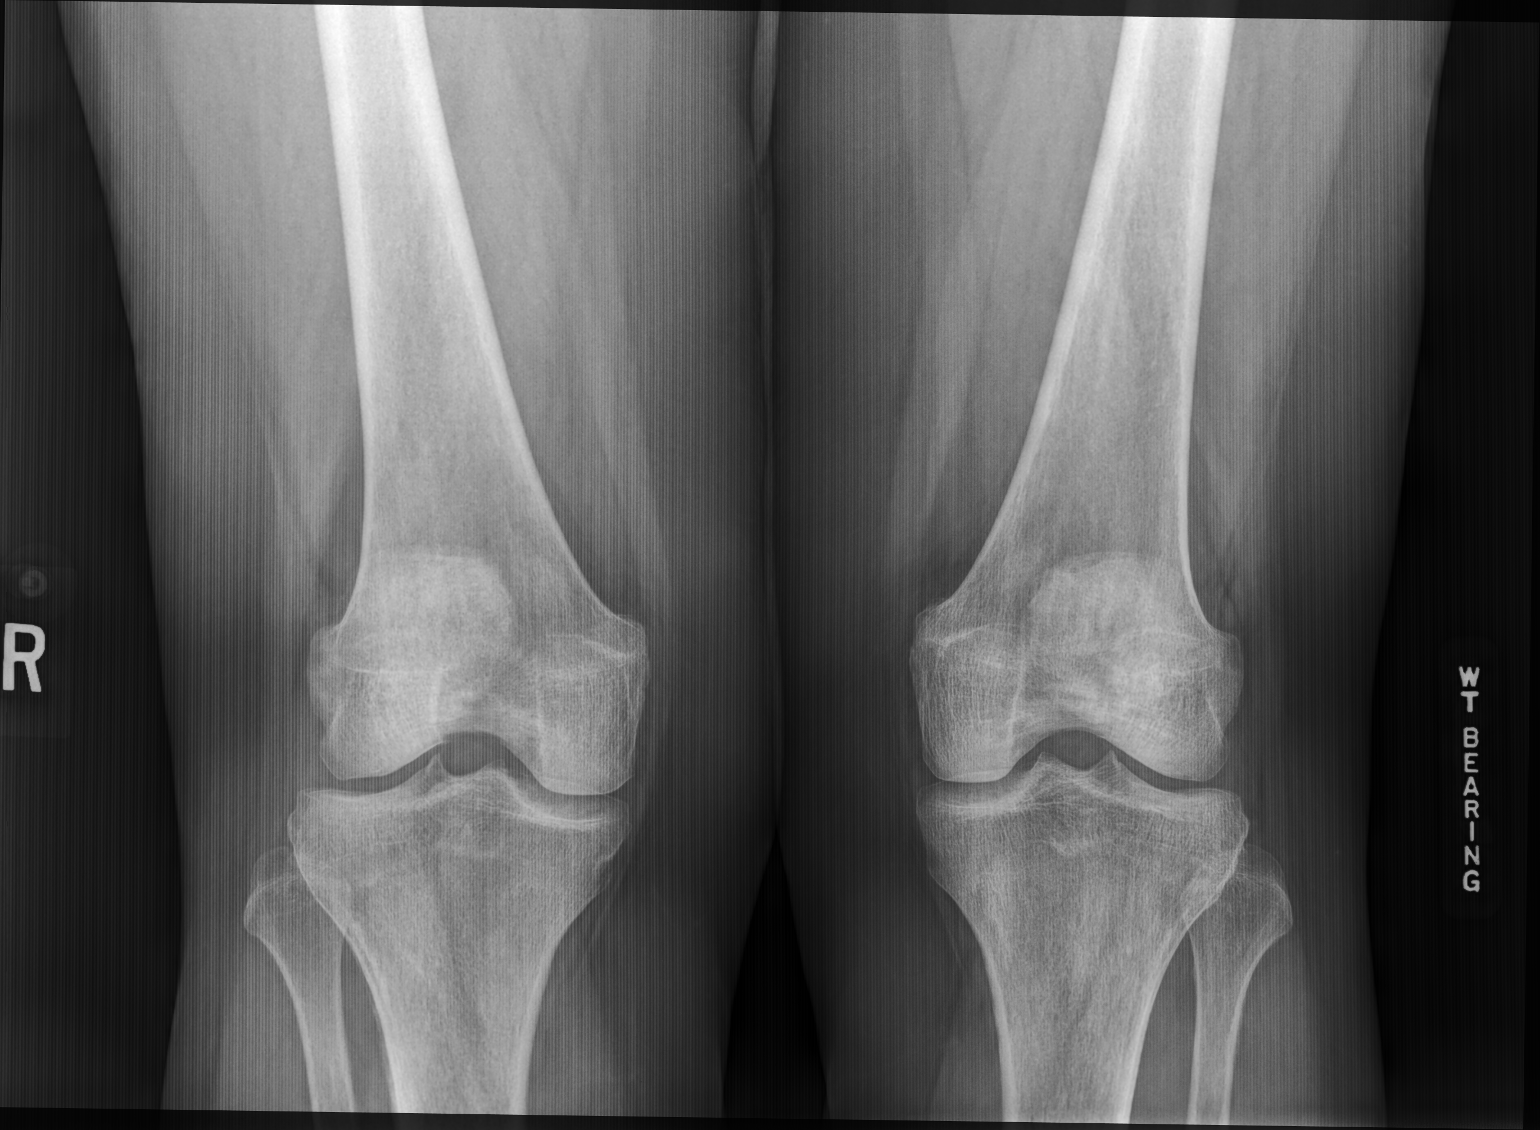

[knee lat]
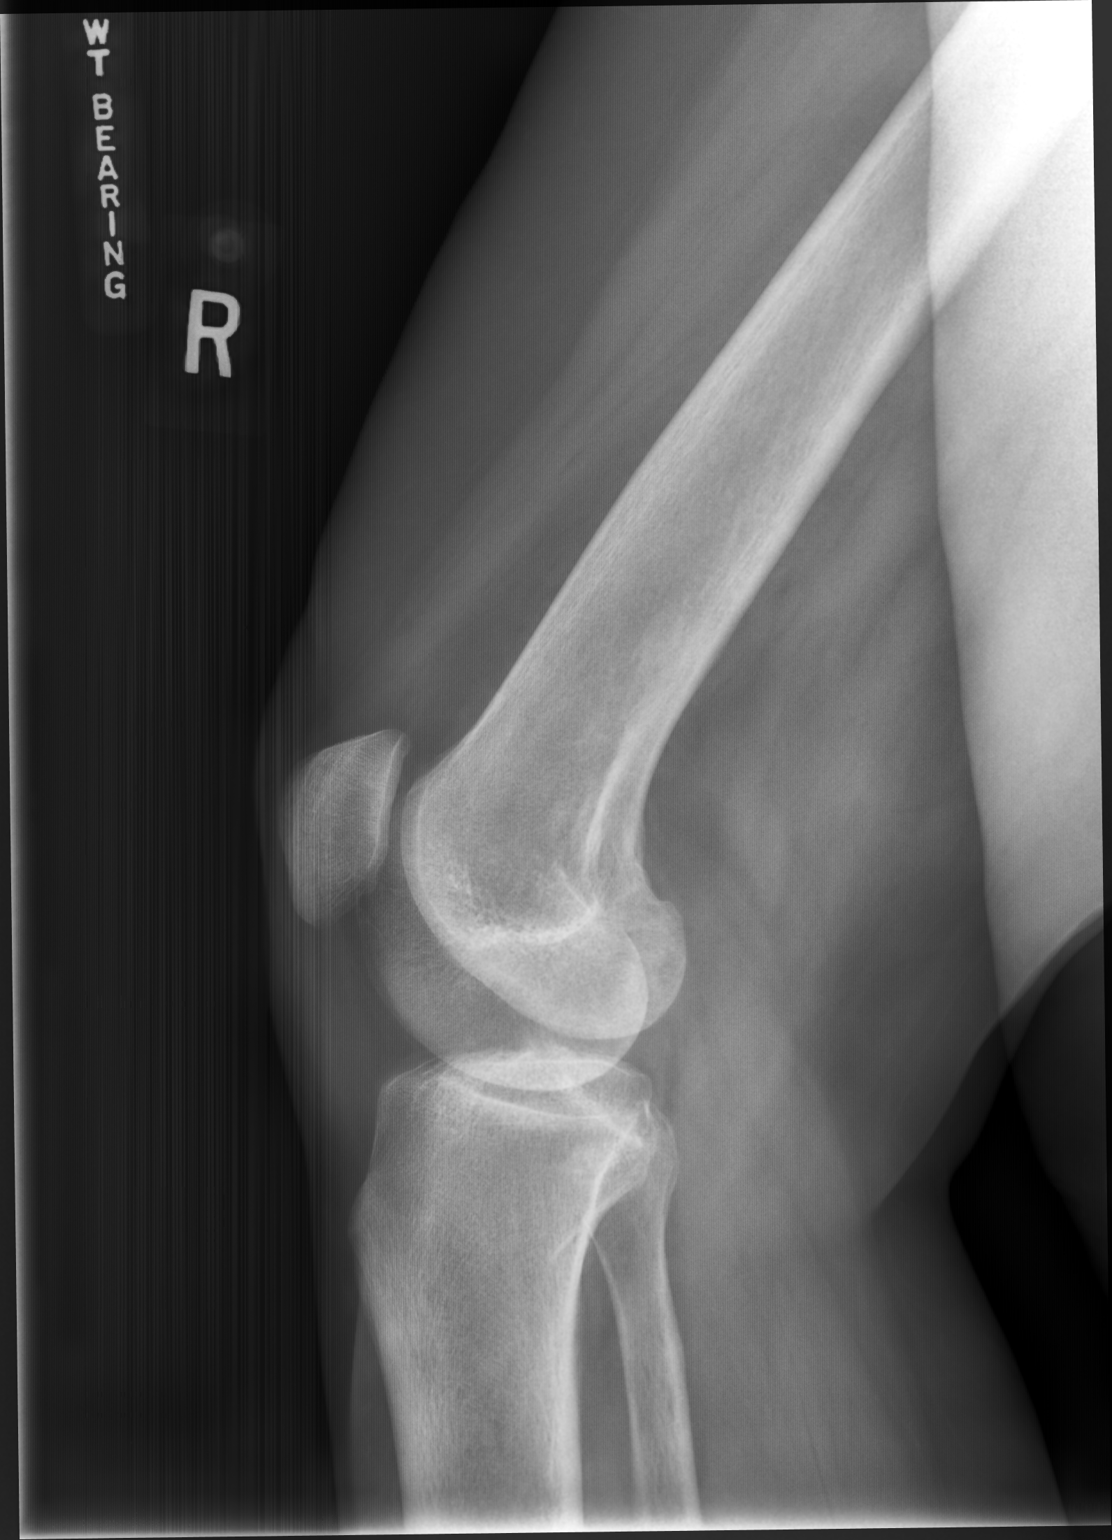

[patella (sunrise) tan]
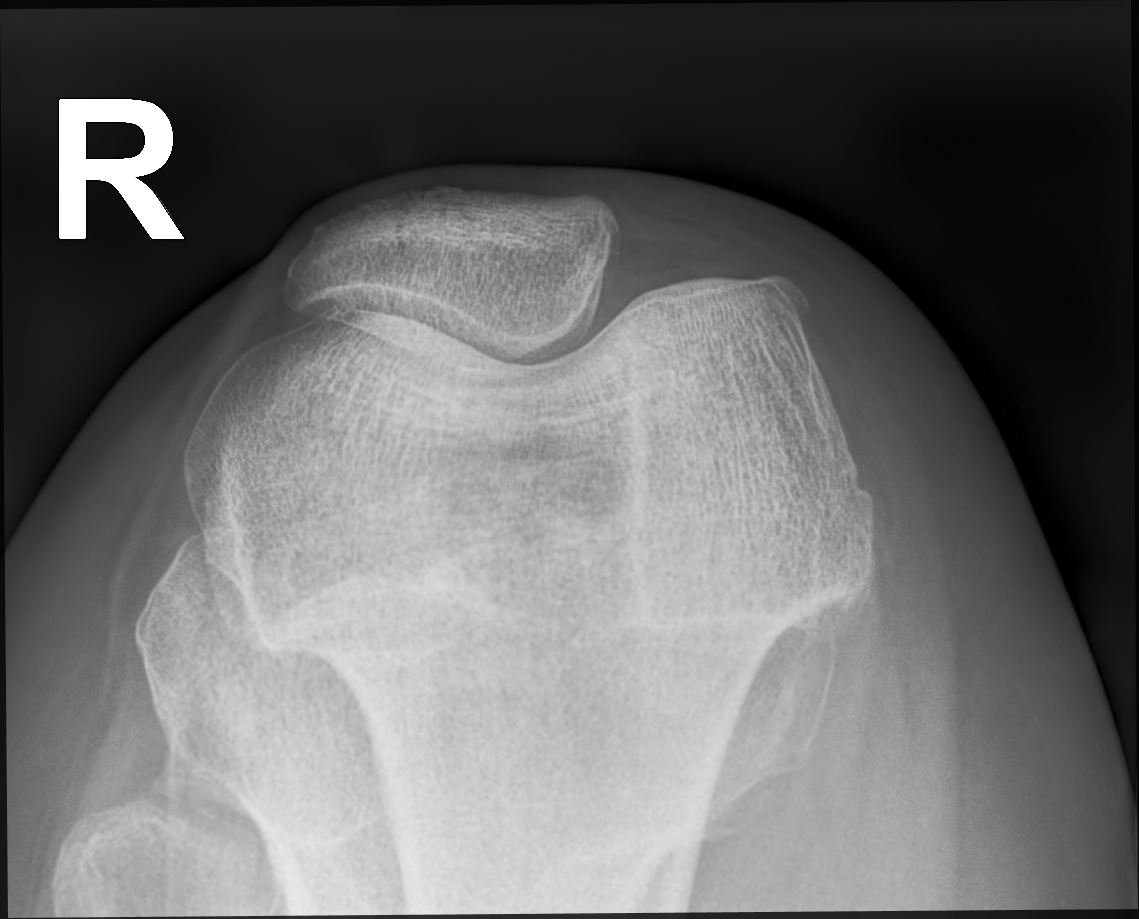

[3 of 3 positions shown; findings below may reference images not displayed]

FINDINGS: No fracture.  No bone lesion.

Knee joint is normally spaced and aligned. No significant
arthropathic change.

No joint effusion.

Soft tissues are unremarkable.
IMPRESSION: Negative.

## 2018-05-16 NOTE — Progress Notes (Signed)
Cassandra Brennan, Cassandra Brennan - 62 y.o. female MRN [id number]  Date of birth: 1956-06-10  Visit Date: 05/16/2018  PCP: Patient, No Pcp Per   Referred by: No ref. provider found  Scribe(s) for today's visit: Wendy Poet, LAT, ATC  SUBJECTIVE:  Cassandra Brennan is here for Follow-up (R knee pain) .    Notes from Initial Visit on 04/04/18: Her R knee pain symptoms INITIALLY: Began a few months ago w/ no specific MOI.  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 Described as mild-mod aching pain, radiating to R proximal lower leg Worsened with walking and standing after prolonged sitting Improved with Salonpas and topical analgesic Additional associated symptoms include: notes occasional mechanical symptoms; no N/T noted in the R LE    At this time symptoms are worsening compared to onset  She has been wearing a knee brace but doesn't feel like the brace is helping.  05/16/18: Compared to the last office visit on 04/04/18, her previously described R knee pain symptoms are improving but she is having more pain w/ loaded R knee flexion especially when transitioning from sit-to-stand. Current symptoms are mild sharp pain & are radiating to R lower leg along her tibia. She has been using Salonpas and a topical analgesic.  She was given a HEP at her last visit and has stopped doing her exercise.  She had a depomedrol 40 injection in her R knee at her last visit which she feels is beneficial.  She feels like she may have had a reaction to the injection and notes having an episode of sweating, feeling fatigued and increased BP (180/100).   REVIEW OF SYSTEMS: Denies night time disturbances. Denies fevers, chills, or night sweats. Denies unexplained weight loss. Denies personal history of cancer. Denies changes in bowel or bladder habits. Denies recent unreported falls. Denies new or worsening  dyspnea or wheezing. Denies headaches or dizziness.  Denies numbness, tingling or weakness  In the extremities.  Denies dizziness or presyncopal episodes Denies lower extremity edema    HISTORY & PERTINENT PRIOR DATA:  Prior History reviewed and updated per electronic medical record.  Significant/pertinent history, findings, studies include:  reports that she has never smoked. She has never used smokeless tobacco. No results for input(s): HGBA1C, LABURIC, CREATINE in the last 8760 hours. No specialty comments available. No problems updated.  OBJECTIVE:  VS:  HT:5\' 5"  (165.1 cm)   WT:172 lb 6.4 oz (78.2 kg)  BMI:28.69    BP:138/84  HR:(Abnormal) 102bpm  TEMP: ( )  RESP:100 %   PHYSICAL EXAM: Constitutional: WDWN, Non-toxic appearing. Psychiatric: Alert & appropriately interactive.  Not depressed or anxious appearing. Respiratory: No increased work of breathing.  Trachea Midline Eyes: Pupils are equal.  EOM intact without nystagmus.  No scleral icterus  Vascular Exam: warm to touch no edema  lower extremity neuro exam: unremarkable  MSK Exam: Right knee is overall well aligned.  She has a positive patellar grind.  No significant pain with medial or lateral joint line palpation.  Small amount of crepitation and popping with McMurray's but no pain with this.  Pain with Trudie Reed is mild.  Ligamentously she is stable.   ASSESSMENT & PLAN:   1. Chronic pain of right knee     PLAN: We will go ahead and send her for x-rays today to have a baseline from an arthritis standpoint.  If  these show only minimal changes further diagnostic evaluation with MRI will be recommended.  We will plan to call her with the results and discuss follow-up at that time.  Follow-up: Return for We will call you about your results.      Please see additional documentation for Objective, Assessment and Plan sections. Pertinent additional documentation may be included in corresponding procedure notes,  imaging studies, problem based documentation and patient instructions. Please see these sections of the encounter for additional information regarding this visit.  CMA/ATC served as Education administrator during this visit. History, Physical, and Plan performed by medical provider. Documentation and orders reviewed and attested to.      Gerda Diss, [facility] Physician

## 2020-03-15 ENCOUNTER — Other Ambulatory Visit: Payer: Self-pay | Admitting: Family Medicine

## 2020-03-15 ENCOUNTER — Ambulatory Visit: Payer: PRIVATE HEALTH INSURANCE | Admitting: Family Medicine

## 2020-03-15 ENCOUNTER — Encounter (INDEPENDENT_AMBULATORY_CARE_PROVIDER_SITE_OTHER): Payer: Self-pay

## 2020-03-15 VITALS — BP 160/98 | HR 90 | Ht 65.0 in | Wt 180.0 lb

## 2020-03-15 DIAGNOSIS — E785 Hyperlipidemia, unspecified: Secondary | ICD-10-CM

## 2020-03-15 DIAGNOSIS — I10 Essential (primary) hypertension: Secondary | ICD-10-CM | POA: Insufficient documentation

## 2020-03-15 MED ORDER — PRAVASTATIN SODIUM 20 MG PO TABS: 20.0000 mg | ORAL_TABLET | Freq: Every day | ORAL | 0 refills | Status: DC

## 2020-03-15 MED ORDER — METOPROLOL SUCCINATE ER 100 MG PO TB24: 100.0000 mg | ORAL_TABLET | Freq: Every day | ORAL | 0 refills | Status: DC

## 2020-03-15 NOTE — Patient Instructions (Signed)
-   Blood work was done today. I will call you with your results and can release them to you on MyChart as well.  - Please schedule a follow up visit with your primary care provider for further refills of your medications.  - Keep a log of your blood pressure readings at different times of the day. If your blood pressure is consistently 140/90 or higher then talk to your primary care provider about the possible need to increase your blood pressure medication. - Keep up the good work of eating healthy, low salt foods, and getting a good amount of physical activity in daily!

## 2020-03-15 NOTE — Progress Notes (Signed)
Subjective:     Patient ID: Cassandra Brennan, female   DOB: [birth year], 64 y.o.   MRN: [id number]  HPI  [facility] work and refills of her medications. Her PCP is Kathryne Hitch, NP, who she states she last saw about one year ago; states she needs to schedule a f/u visit with her.   Reports she is compliant with her medications. States morning home BP readings are 120-130/80. Reports eating a low-salt healthy diet and walking on her treadmill daily for exercise. She denies any chest pain, shortness of breath, h/a, or vision changes.   Past Medical History:  Diagnosis Date  . Hypertension   Also reports was diagnosed with pre-diabetes last year.  Allergies  Allergen Reactions  . Other     Had a rxn to cortisone knee injection - had elevated BP and HR  . Robitussin (Alcohol Free) [Guaifenesin] Hives    Current Outpatient Medications:  .  metoprolol succinate (TOPROL-XL) 100 MG 24 hr tablet, Take 1 tablet (100 mg total) by mouth daily. Take with or immediately following a meal., Disp: 30 tablet, Rfl: 0 .  pravastatin (PRAVACHOL) 20 MG tablet, Take 1 tablet (20 mg total) by mouth daily., Disp: 30 tablet, Rfl: 0    Review of Systems  Constitutional: Negative for chills, fatigue, fever and unexpected weight change.  HENT: Negative for congestion, ear pain, sinus pressure, sinus pain and sore throat.   Eyes: Negative for discharge and visual disturbance.  Respiratory: Negative for cough, shortness of breath and wheezing.   Cardiovascular: Negative for chest pain and leg swelling.  Gastrointestinal: Negative for abdominal pain, blood in stool, constipation, diarrhea, nausea and vomiting.  Genitourinary: Negative for difficulty urinating and hematuria.  Skin: Negative for color change.  Neurological: Negative for dizziness, weakness, light-headedness and headaches.  Hematological: Negative for adenopathy.  All other systems reviewed and are  negative.      Objective:   Physical Exam Vitals reviewed.  Constitutional:      General: She is not in acute distress.    Appearance: Normal appearance. She is well-developed.  HENT:     Head: Normocephalic and atraumatic.  Eyes:     General:        Right eye: No discharge.        Left eye: No discharge.  Cardiovascular:     Rate and Rhythm: Normal rate and regular rhythm.     Heart sounds: Normal heart sounds.  Pulmonary:     Effort: Pulmonary effort is normal. No respiratory distress.     Breath sounds: Normal breath sounds.  Musculoskeletal:     Cervical back: Neck supple.  Skin:    General: Skin is warm and dry.  Neurological:     Mental Status: She is alert and oriented to person, place, and time.  Psychiatric:        Mood and Affect: Mood normal.        Behavior: Behavior normal.    Today's Vitals   03/15/20 1534  BP: (!) 160/98  Pulse: 90  SpO2: 99%  Weight: 180 lb (81.6 kg)  Height: 5\' 5"  (1.651 m)   Body mass index is 29.95 kg/m.      Assessment:     Essential hypertension  Hyperlipidemia, unspecified hyperlipidemia type      Plan:     1. BP elevated in office today. Compared home BP monitor in office to manual and readings were similar. Home BP  readings have been good. Recommend she continue to check her BP in the mornings but also check occasionally in the evenings and keep track of those. She was advised if BP consistently elevated above 140/90 she needs to f/u with her PCP for possible increase in her medication. Continue low-salt diet and exercise. Will check lab work today: CBC, CMP, A1c, and lipid panel. 1 month refill given for her medications. Discussed she will need to schedule a f/u with her PCP for further refills. Will call her with lab results. Recommend she obtain Mychart access so she can print her results to take to her PCP for review. If BP elevated significantly, have symptoms of chest pain, shortness of breath, severe h/a, changes  in vision, slurred speech, or any other concerning symptoms she should go to ED for emergency care. F/u here prn.

## 2020-03-16 LAB — COMPLETE METABOLIC PANEL WITH GFR
AG Ratio: 1.6 (calc) (ref 1.0–2.5)
ALT: 25 U/L (ref 6–29)
AST: 20 U/L (ref 10–35)
Albumin: 4.4 g/dL (ref 3.6–5.1)
Alkaline phosphatase (APISO): 97 U/L (ref 37–153)
BUN/Creatinine Ratio: 11 (calc) (ref 6–22)
BUN: 12 mg/dL (ref 7–25)
CO2: 25 mmol/L (ref 20–32)
Calcium: 9.5 mg/dL (ref 8.6–10.4)
Chloride: 107 mmol/L (ref 98–110)
Creat: 1.06 mg/dL — ABNORMAL HIGH (ref 0.50–0.99)
GFR, Est African American: 65 mL/min/{1.73_m2} (ref >=60)
GFR, Est Non African American: 56 mL/min/{1.73_m2} — ABNORMAL LOW (ref >=60)
Globulin: 2.7 g/dL (calc) (ref 1.9–3.7)
Glucose, Bld: 105 mg/dL (ref 65–139)
Potassium: 3.9 mmol/L (ref 3.5–5.3)
Sodium: 141 mmol/L (ref 135–146)
Total Bilirubin: 0.4 mg/dL (ref 0.2–1.2)
Total Protein: 7.1 g/dL (ref 6.1–8.1)

## 2020-03-16 LAB — CBC
HCT: 36.8 % (ref 35.0–45.0)
Hemoglobin: 12.2 g/dL (ref 11.7–15.5)
MCH: 28.7 pg (ref 27.0–33.0)
MCHC: 33.2 g/dL (ref 32.0–36.0)
MCV: 86.6 fL (ref 80.0–100.0)
MPV: 11.7 fL (ref 7.5–12.5)
Platelets: 275 10*3/uL (ref 140–400)
RBC: 4.25 10*6/uL (ref 3.80–5.10)
RDW: 14.1 % (ref 11.0–15.0)
WBC: 7.9 10*3/uL (ref 3.8–10.8)

## 2020-03-16 LAB — LIPID PANEL
Cholesterol: 185 mg/dL (ref <200)
HDL: 65 mg/dL (ref >=50)
LDL Cholesterol (Calc): 101 mg/dL (calc) — ABNORMAL HIGH
Non-HDL Cholesterol (Calc): 120 mg/dL (calc) (ref <130)
Total CHOL/HDL Ratio: 2.8 (calc) (ref <5.0)
Triglycerides: 95 mg/dL (ref <150)

## 2020-03-16 LAB — HEMOGLOBIN A1C W/OUT EAG: Hgb A1c MFr Bld: 5.4 % of total Hgb (ref <5.7)

## 2020-03-17 NOTE — Progress Notes (Signed)
Spoke with Cassandra Brennan on the phone and reviewed lab results. Recommend repeat CMP in about 3 months to recheck kidney function. Encouraged to bring lab results to her PCP for follow up.

## 2020-05-31 ENCOUNTER — Encounter: Payer: Self-pay | Admitting: Internal Medicine

## 2020-07-29 ENCOUNTER — Other Ambulatory Visit: Payer: Self-pay

## 2020-07-29 ENCOUNTER — Ambulatory Visit (AMBULATORY_SURGERY_CENTER): Payer: Self-pay | Admitting: *Deleted

## 2020-07-29 VITALS — Ht 65.0 in | Wt 184.0 lb

## 2020-07-29 DIAGNOSIS — R195 Other fecal abnormalities: Secondary | ICD-10-CM

## 2020-07-29 MED ORDER — SUPREP BOWEL PREP KIT 17.5-3.13-1.6 GM/177ML PO SOLN: 1.0000 | Freq: Once | ORAL | 0 refills | Status: AC

## 2020-07-29 NOTE — Progress Notes (Signed)

## 2020-08-12 ENCOUNTER — Encounter: Payer: PRIVATE HEALTH INSURANCE | Admitting: Internal Medicine

## 2020-09-28 ENCOUNTER — Other Ambulatory Visit: Payer: Self-pay

## 2020-09-28 ENCOUNTER — Encounter: Payer: Self-pay | Admitting: Internal Medicine

## 2020-09-28 ENCOUNTER — Ambulatory Visit (AMBULATORY_SURGERY_CENTER): Payer: 59 | Admitting: Internal Medicine

## 2020-09-28 VITALS — BP 136/83 | HR 84 | Temp 98.0°F | Resp 21 | Ht 65.0 in | Wt 184.0 lb

## 2020-09-28 DIAGNOSIS — K5730 Diverticulosis of large intestine without perforation or abscess without bleeding: Secondary | ICD-10-CM | POA: Diagnosis not present

## 2020-09-28 DIAGNOSIS — R195 Other fecal abnormalities: Secondary | ICD-10-CM | POA: Diagnosis not present

## 2020-09-28 MED ORDER — SODIUM CHLORIDE 0.9 % IV SOLN: 500.0000 mL | Freq: Once | INTRAVENOUS | Status: DC

## 2020-09-28 NOTE — Progress Notes (Signed)
pt tolerated well. VSS. awake and to recovery. Report given to RN.  

## 2020-09-28 NOTE — Op Note (Signed)
[facility] Patient Name: Cassandra Brennan Procedure Date: 09/28/2020 8:36 AM MRN: [id number] Endoscopist: Docia Chuck. Henrene Pastor , MD Age: 64 Referring MD:  Date of Birth: [birth year] Gender: Female Account #: [id number] Procedure:                Colonoscopy Indications:              Positive Cologuard test Medicines:                Monitored Anesthesia Care Procedure:                Pre-Anesthesia Assessment:                           - Prior to the procedure, a History and Physical                            was performed, and patient medications and                            allergies were reviewed. The patient's tolerance of                            previous anesthesia was also reviewed. The risks                            and benefits of the procedure and the sedation                            options and risks were discussed with the patient.                            All questions were answered, and informed consent                            was obtained. Prior Anticoagulants: The patient has                            taken no previous anticoagulant or antiplatelet                            agents. After reviewing the risks and benefits, the                            patient was deemed in satisfactory condition to                            undergo the procedure.                           After obtaining informed consent, the colonoscope                            was passed under direct vision. Throughout the  procedure, the patient's blood pressure, pulse, and                            oxygen saturations were monitored continuously. The                            Colonoscope was introduced through the anus and                            advanced to the the cecum, identified by                            appendiceal orifice and ileocecal valve. The                            ileocecal valve, appendiceal orifice, and rectum                             were photographed. The quality of the bowel                            preparation was excellent. The colonoscopy was                            performed without difficulty. The patient tolerated                            the procedure well. The bowel preparation used was                            SUPREP via split dose instruction. Scope In: 8:52:19 AM Scope Out: 9:06:28 AM Scope Withdrawal Time: 0 hours 10 minutes 22 seconds  Total Procedure Duration: 0 hours 14 minutes 9 seconds  Findings:                 Multiple diverticula were found in the left colon.                           The exam was otherwise without abnormality on                            direct and retroflexion views. Complications:            No immediate complications. Estimated blood loss:                            None. Estimated Blood Loss:     Estimated blood loss: none. Impression:               - Diverticulosis in the left colon.                           - The examination was otherwise normal on direct                            and retroflexion  views.                           - No specimens collected. Recommendation:           - Repeat colonoscopy in 10 years for screening                            purposes.                           - Patient has a contact number available for                            emergencies. The signs and symptoms of potential                            delayed complications were discussed with the                            patient. Return to normal activities tomorrow.                            Written discharge instructions were provided to the                            patient.                           - Resume previous diet.                           - Continue present medications. Docia Chuck. Henrene Pastor, MD 09/28/2020 9:11:21 AM This report has been signed electronically.

## 2020-09-28 NOTE — Patient Instructions (Addendum)
Read all of the handouts given to you by your recovery room nurse.  Thank-you for choosing [place] for your healthcare needs today.  YOU HAD AN ENDOSCOPIC PROCEDURE TODAY AT [facility]:   Refer to the procedure report that was given to you for any specific questions about what was found during the examination.  If the procedure report does not answer your questions, please call your gastroenterologist to clarify.  If you requested that your care partner not be given the details of your procedure findings, then the procedure report has been included in a sealed envelope for you to review at your convenience later.  YOU SHOULD EXPECT: Some feelings of bloating in the abdomen. Passage of more gas than usual.  Walking can help get rid of the air that was put into your GI tract during the procedure and reduce the bloating. If you had a lower endoscopy (such as a colonoscopy or flexible sigmoidoscopy) you may notice spotting of blood in your stool or on the toilet paper. If you underwent a bowel prep for your procedure, you may not have a normal bowel movement for a few days.  Please Note:  You might notice some irritation and congestion in your nose or some drainage.  This is from the oxygen used during your procedure.  There is no need for concern and it should clear up in a day or so.  SYMPTOMS TO REPORT IMMEDIATELY:   Following lower endoscopy (colonoscopy or flexible sigmoidoscopy):  Excessive amounts of blood in the stool  Significant tenderness or worsening of abdominal pains  Swelling of the abdomen that is new, acute  Fever of 100F or higher   For urgent or emergent issues, a gastroenterologist can be reached at any hour by calling [phone]. Do not use MyChart messaging for urgent concerns.    DIET:  We do recommend a small meal at first, but then you may proceed to your regular diet.  Drink plenty of fluids but you should avoid alcoholic beverages for 24 hours. Try to  increase the fiber I your diet, and drink plenty of water.  ACTIVITY:  You should plan to take it easy for the rest of today and you should NOT DRIVE or use heavy machinery until tomorrow (because of the sedation medicines used during the test).    FOLLOW UP: Our staff will call the number listed on your records 48-72 hours following your procedure to check on you and address any questions or concerns that you may have regarding the information given to you following your procedure. If we do not reach you, we will leave a message.  We will attempt to reach you two times.  During this call, we will ask if you have developed any symptoms of COVID 19. If you develop any symptoms (ie: fever, flu-like symptoms, shortness of breath, cough etc.) before then, please call [phone].  If you test positive for Covid 19 in the 2 weeks post procedure, please call and report this information to [place].     SIGNATURES/CONFIDENTIALITY: You and/or your care partner have signed paperwork which will be entered into your electronic medical record.  These signatures attest to the fact that that the information above on your After Visit Summary has been reviewed and is understood.  Full responsibility of the confidentiality of this discharge information lies with you and/or your care-partner.

## 2020-09-28 NOTE — Progress Notes (Signed)
Pt's states no medical or surgical changes since previsit or office visit. 

## 2020-09-30 ENCOUNTER — Telehealth: Payer: Self-pay

## 2020-09-30 NOTE — Telephone Encounter (Signed)
  Follow up Call-  Call back number 09/28/2020  Post procedure Call Back phone  # [phone]  Permission to leave phone message Yes  Some recent data might be hidden     Patient questions:  Do you have a fever, pain , or abdominal swelling? No. Pain Score  0 *  Have you tolerated food without any problems? Yes.    Have you been able to return to your normal activities? Yes.    Do you have any questions about your discharge instructions: Diet   No. Medications  No. Follow up visit  No.  Do you have questions or concerns about your Care? No.  Actions: * If pain score is 4 or above: No action needed, pain <4.  1. Have you developed a fever since your procedure? no  2.   Have you had an respiratory symptoms (SOB or cough) since your procedure? no  3.   Have you tested positive for COVID 19 since your procedure no  4.   Have you had any family members/close contacts diagnosed with the COVID 19 since your procedure?  no   If yes to any of these questions please route to Joylene John, RN and Joella Prince, RN

## 2021-09-18 ENCOUNTER — Other Ambulatory Visit: Payer: Self-pay | Admitting: *Deleted

## 2021-09-18 DIAGNOSIS — Z1231 Encounter for screening mammogram for malignant neoplasm of breast: Secondary | ICD-10-CM

## 2021-09-19 ENCOUNTER — Ambulatory Visit
Admission: RE | Admit: 2021-09-19 | Discharge: 2021-09-19 | Disposition: A | Discharge status: Home / Self Care | Payer: 59 | Source: Ambulatory Visit | Attending: *Deleted | Admitting: *Deleted

## 2021-09-19 DIAGNOSIS — Z1231 Encounter for screening mammogram for malignant neoplasm of breast: Secondary | ICD-10-CM

## 2021-09-20 ENCOUNTER — Other Ambulatory Visit: Payer: Self-pay | Admitting: *Deleted

## 2021-09-20 DIAGNOSIS — N6320 Unspecified lump in the left breast, unspecified quadrant: Secondary | ICD-10-CM

## 2021-10-10 ENCOUNTER — Other Ambulatory Visit: Payer: 59

## 2021-10-19 ENCOUNTER — Ambulatory Visit
Admission: RE | Admit: 2021-10-19 | Discharge: 2021-10-19 | Disposition: A | Discharge status: Home / Self Care | Payer: 59 | Source: Ambulatory Visit | Attending: *Deleted | Admitting: *Deleted

## 2021-10-19 ENCOUNTER — Other Ambulatory Visit: Payer: Self-pay

## 2021-10-19 ENCOUNTER — Other Ambulatory Visit: Payer: Self-pay | Admitting: *Deleted

## 2021-10-19 DIAGNOSIS — R599 Enlarged lymph nodes, unspecified: Secondary | ICD-10-CM

## 2021-10-19 DIAGNOSIS — N6320 Unspecified lump in the left breast, unspecified quadrant: Secondary | ICD-10-CM

## 2021-10-19 IMAGING — MG DIGITAL DIAGNOSTIC BILAT W/ TOMO W/ CAD
7 of 16 series · 7 of 40 positions shown · non-contrast
Comparison: None.
COMPARISON: None.

Addendum:
CLINICAL DATA: Patient presents for palpable left axillary
abnormality.

EXAM:
DIGITAL DIAGNOSTIC BILATERAL MAMMOGRAM WITH TOMOSYNTHESIS AND CAD;
ULTRASOUND LEFT BREAST LIMITED
TECHNIQUE: Bilateral digital diagnostic mammography and breast tomosynthesis
was performed. The images were evaluated with computer-aided
detection.; Targeted ultrasound examination of the left breast was
performed.

[R CC synth-2D (1 of 2)]
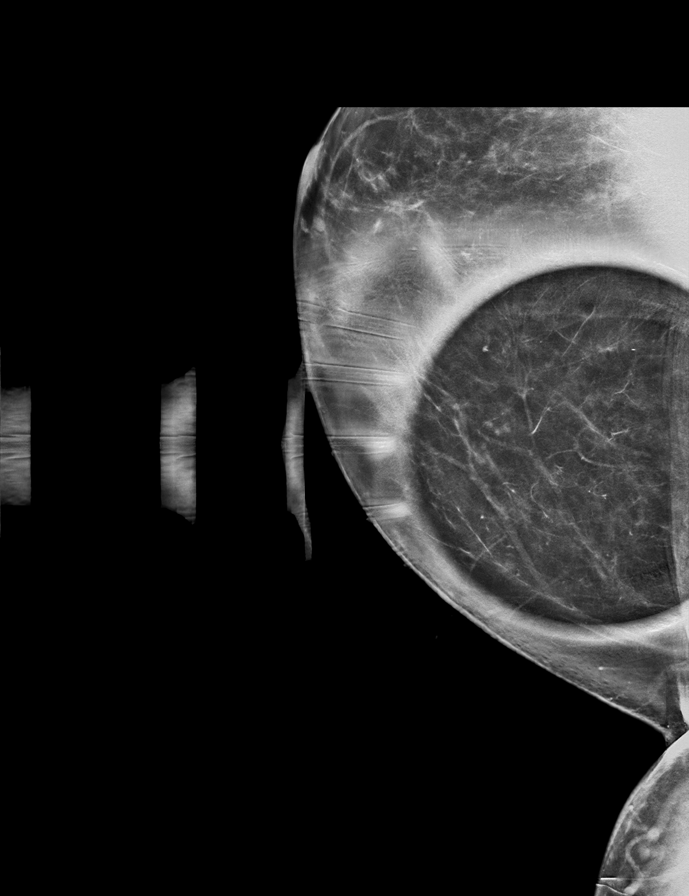

[L CC synth-2D (1 of 2)]
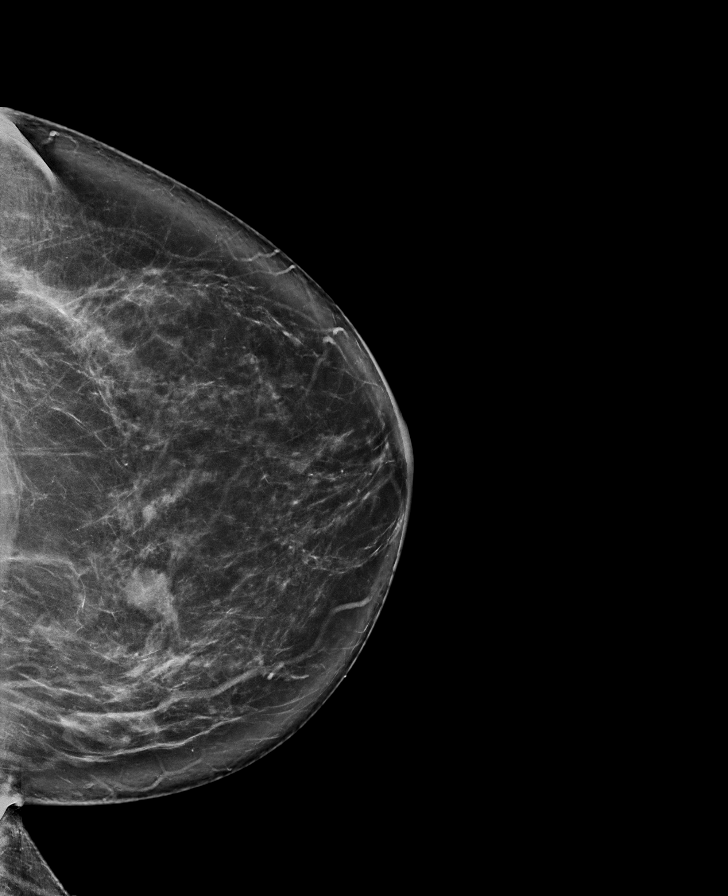

[R MLO synth-2D]
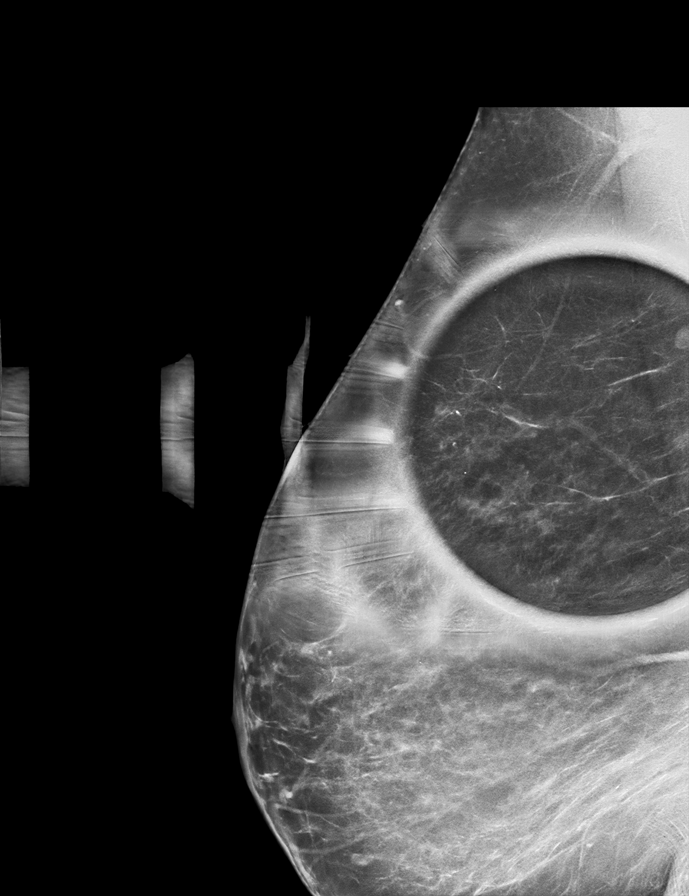

[L TAN synth-2D]
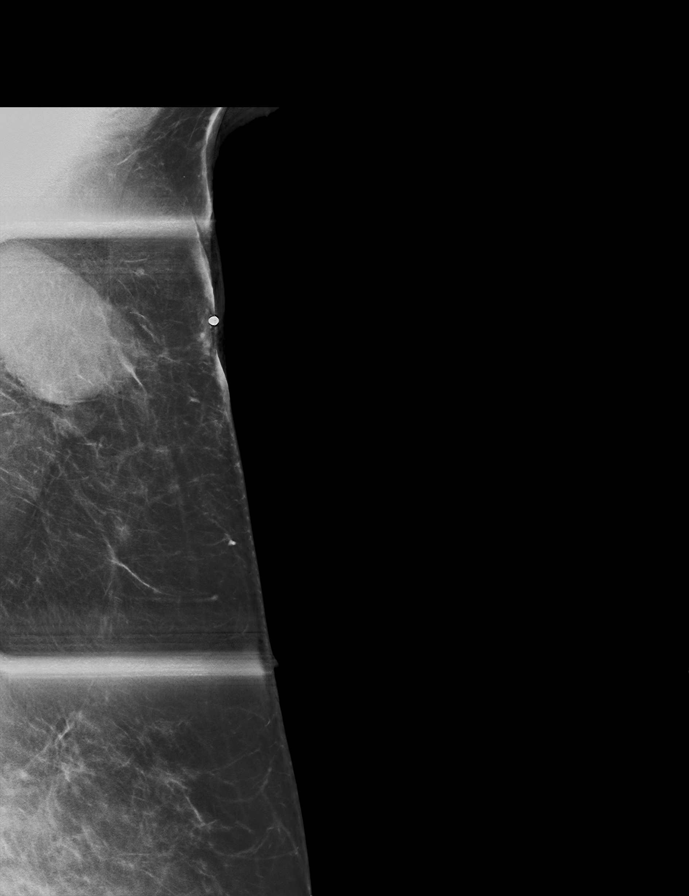

[L CC synth-2D (2 of 2)]
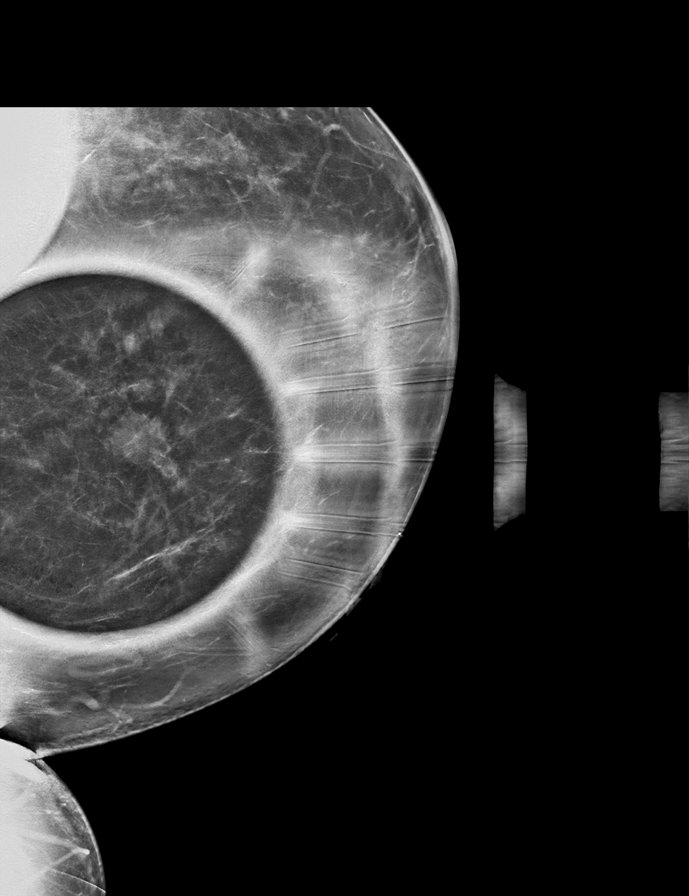

[L MLO synth-2D]
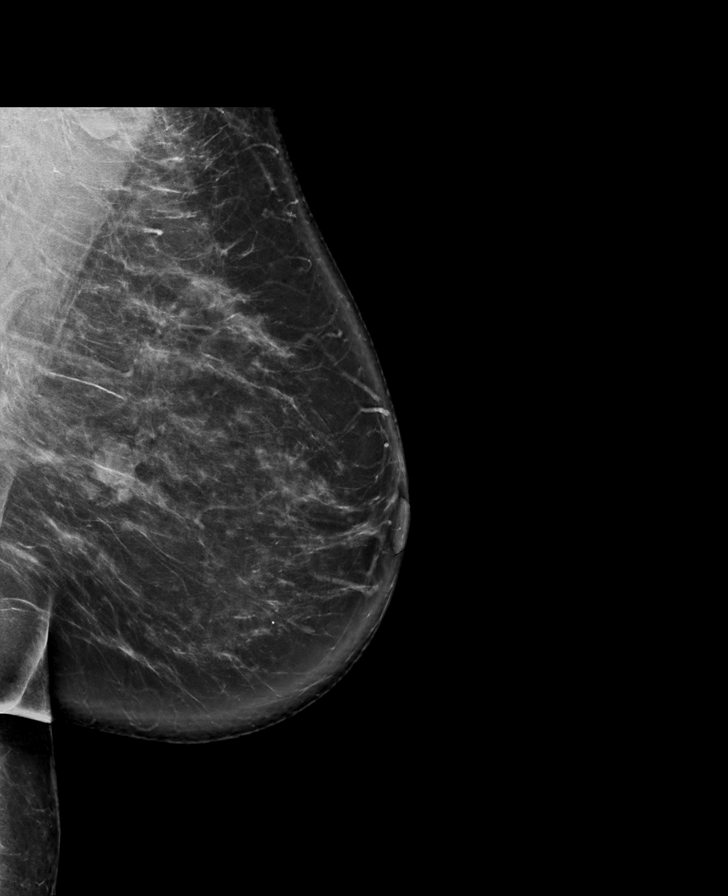

[R CC synth-2D (2 of 2)]
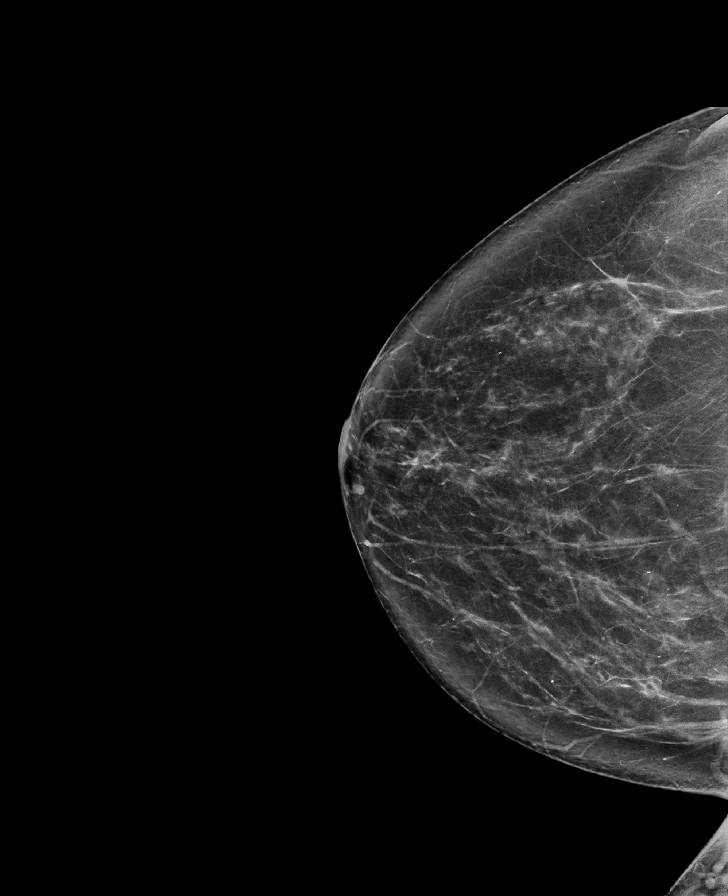

[7 of 40 positions shown; findings below may reference images not displayed]

ACR Breast Density Category c: The breast tissue is heterogeneously
dense, which may obscure small masses.
FINDINGS: Underlying the palpable marker within the left axilla is a large
mass favored to represent an enlarged lymph node.

Within the upper inner left breast posterior depth there is a
persistent irregular mass. There is a smaller oval circumscribed
mass within the left breast just lateral to the dominant mass. No
additional concerning findings within either breast.

Targeted ultrasound is performed, showing a 1.6 x 2.0 x 1.0 cm
irregular hypoechoic mass left breast 10 o'clock position 4 cm from
nipple.

There is an adjacent 5 x 4 x 5 mm irregular hypoechoic mass left
breast 12 o'clock position 4 cm from the nipple.

Additionally, in this area is a 4 x 5 x 3 mm complicated cyst left
breast 11 o'clock position 4 cm from nipple.

Palpable mass within the left axilla corresponds with a markedly
enlarged lymph node.
IMPRESSION: Suspicious left breast mass 10 o'clock position.

Adjacent suspicious satellite nodule left breast 10 o'clock
position.

Palpable mass left axilla corresponds with a large lymph node.

RECOMMENDATION:
Ultrasound-guided core needle biopsy left breast mass 10 o'clock
position.

Ultrasound-guided core needle biopsy of the adjacent suspicious
satellite nodule left breast 10 o'clock position.

Ultrasound-guided core needle biopsy of the thickened left axillary
lymph node.

I have discussed the findings and recommendations with the patient.
If applicable, a reminder letter will be sent to the patient
regarding the next appointment.

BI-RADS CATEGORY  5: Highly suggestive of malignancy.

ADDENDUM:
Addendum for correction of impression and recommendation.

In the original impression, the satellite nodule was incorrectly
labeled at the 10 o'clock position. It is at the 12 o'clock
position.

Recommendation:

Ultrasound-guided core needle biopsy left breast mass 10 o'clock
position.

Ultrasound-guided core needle biopsy adjacent suspicious satellite
nodule left breast 12 o'clock position.

Ultrasound-guided core needle biopsy of the thickened left axillary
lymph node.

*** End of Addendum ***
ACR Breast Density Category c: The breast tissue is heterogeneously
dense, which may obscure small masses.
FINDINGS: Underlying the palpable marker within the left axilla is a large
mass favored to represent an enlarged lymph node.

Within the upper inner left breast posterior depth there is a
persistent irregular mass. There is a smaller oval circumscribed
mass within the left breast just lateral to the dominant mass. No
additional concerning findings within either breast.

Targeted ultrasound is performed, showing a 1.6 x 2.0 x 1.0 cm
irregular hypoechoic mass left breast 10 o'clock position 4 cm from
nipple.

There is an adjacent 5 x 4 x 5 mm irregular hypoechoic mass left
breast 12 o'clock position 4 cm from the nipple.

Additionally, in this area is a 4 x 5 x 3 mm complicated cyst left
breast 11 o'clock position 4 cm from nipple.

Palpable mass within the left axilla corresponds with a markedly
enlarged lymph node.
IMPRESSION: Suspicious left breast mass 10 o'clock position.

Adjacent suspicious satellite nodule left breast 10 o'clock
position.

Palpable mass left axilla corresponds with a large lymph node.

RECOMMENDATION:
Ultrasound-guided core needle biopsy left breast mass 10 o'clock
position.

Ultrasound-guided core needle biopsy of the adjacent suspicious
satellite nodule left breast 10 o'clock position.

Ultrasound-guided core needle biopsy of the thickened left axillary
lymph node.

I have discussed the findings and recommendations with the patient.
If applicable, a reminder letter will be sent to the patient
regarding the next appointment.

BI-RADS CATEGORY  5: Highly suggestive of malignancy.

## 2021-10-19 IMAGING — US US BREAST*L* LIMITED INC AXILLA
1 series · 12 of 24 positions shown · non-contrast
Comparison: None.
COMPARISON: None.

Addendum:
CLINICAL DATA: Patient presents for palpable left axillary
abnormality.

EXAM:
DIGITAL DIAGNOSTIC BILATERAL MAMMOGRAM WITH TOMOSYNTHESIS AND CAD;
ULTRASOUND LEFT BREAST LIMITED
TECHNIQUE: Bilateral digital diagnostic mammography and breast tomosynthesis
was performed. The images were evaluated with computer-aided
detection.; Targeted ultrasound examination of the left breast was
performed.

[Series 1: us breast*left* limited inc axilla · 0.07mm/px · 12 of 24 slices shown]
[im 2/24]
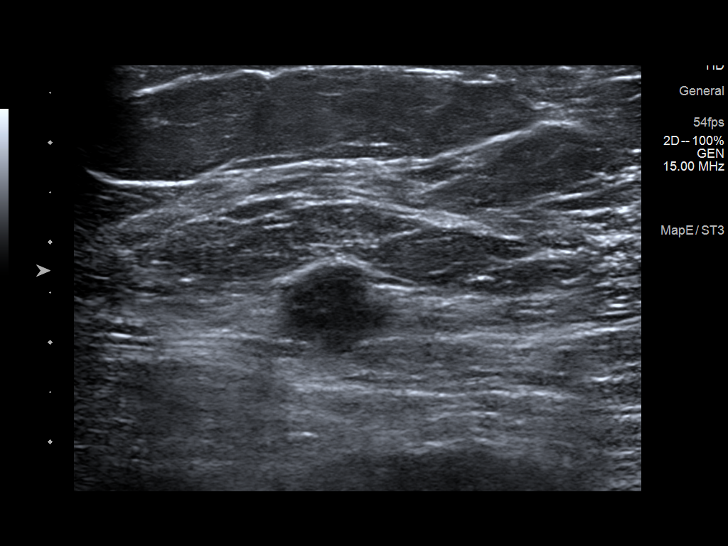
[im 4/24]
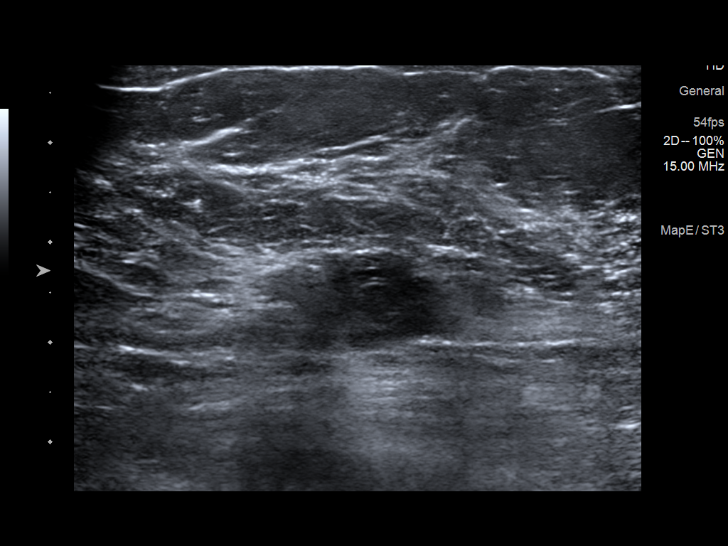
[im 6/24]
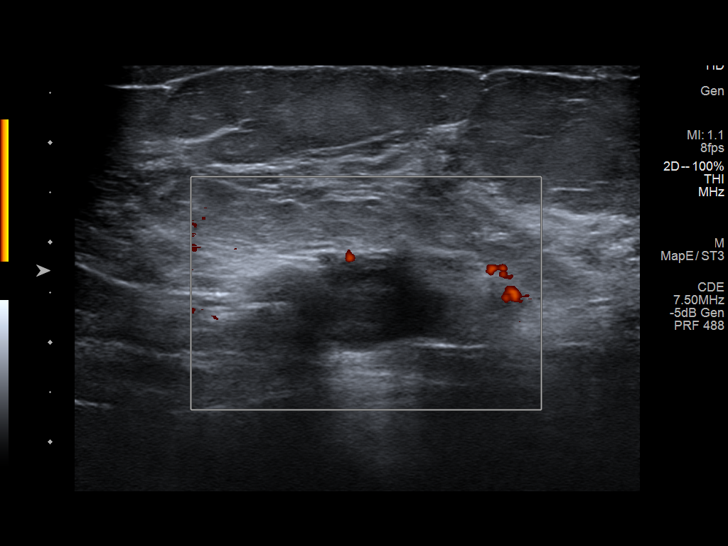
[im 8/24]
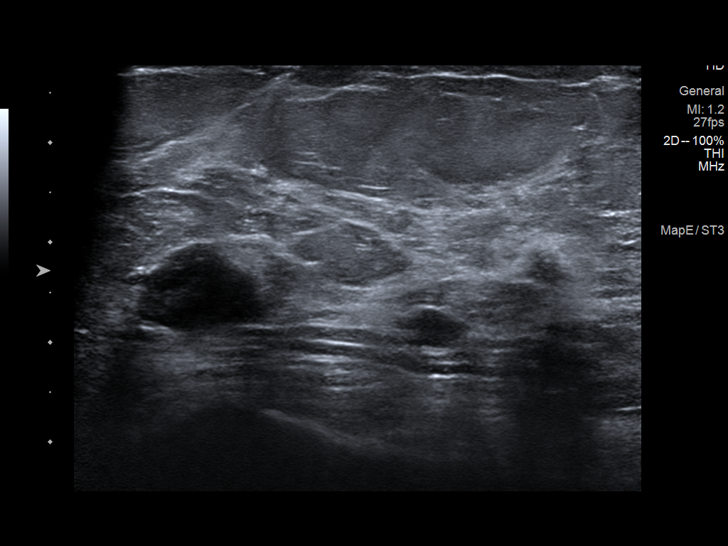
[im 10/24]
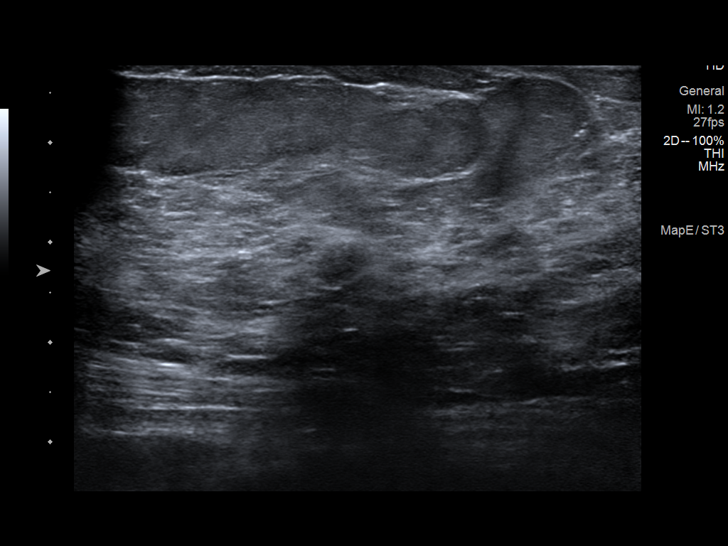
[im 12/24]
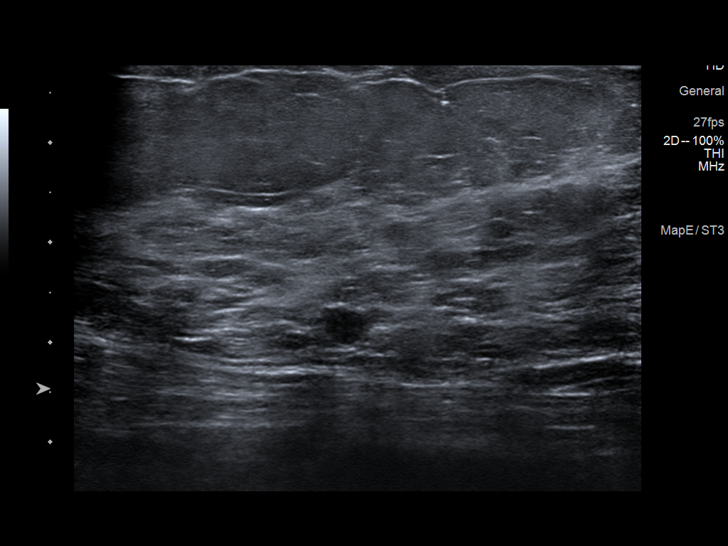
[im 14/24]
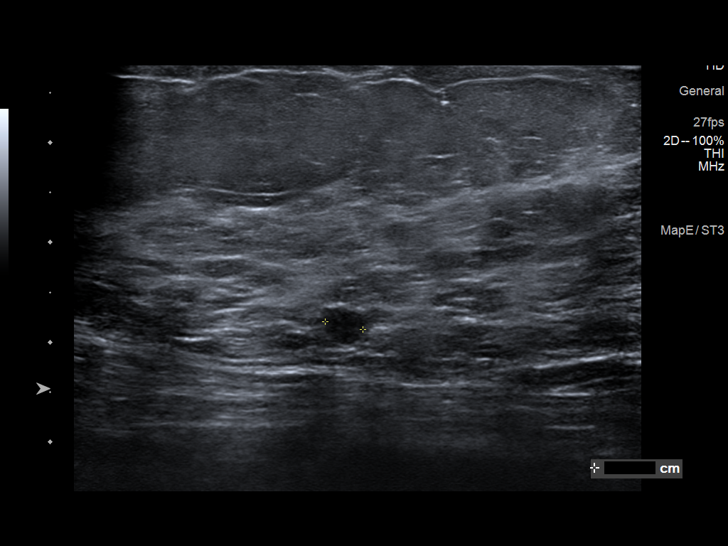
[im 16/24]
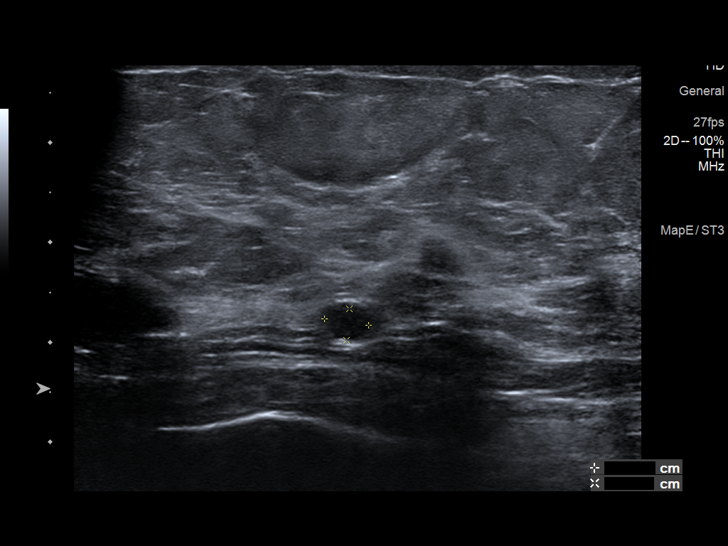
[im 18/24]
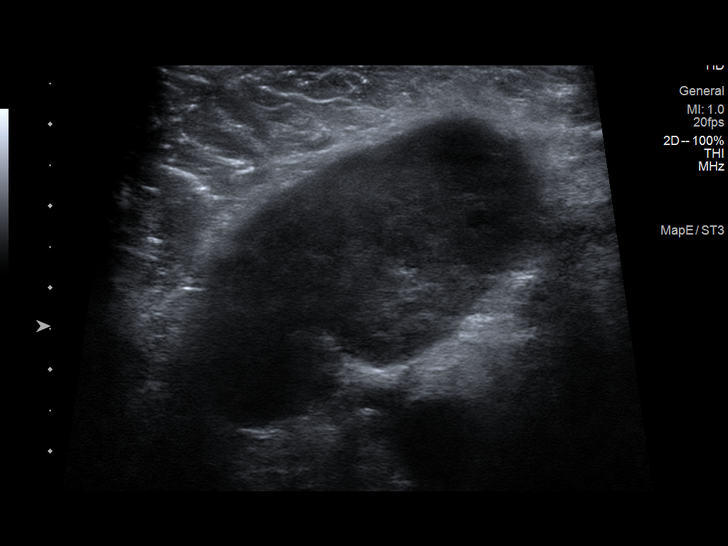
[im 20/24]
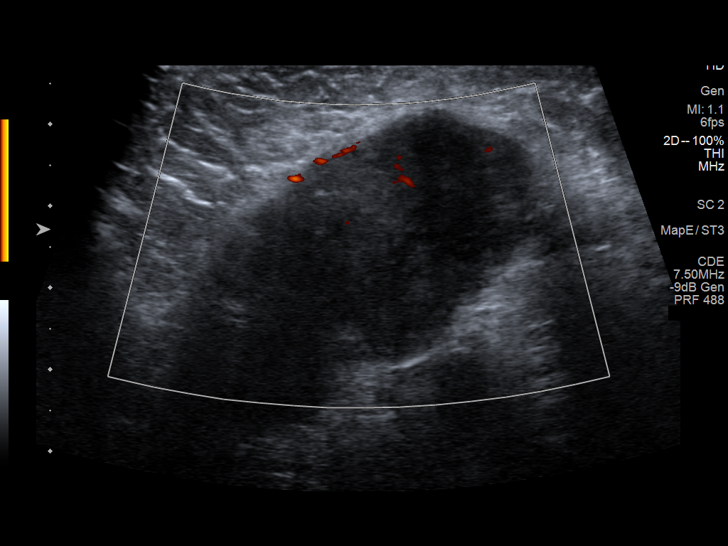
[im 22/24]
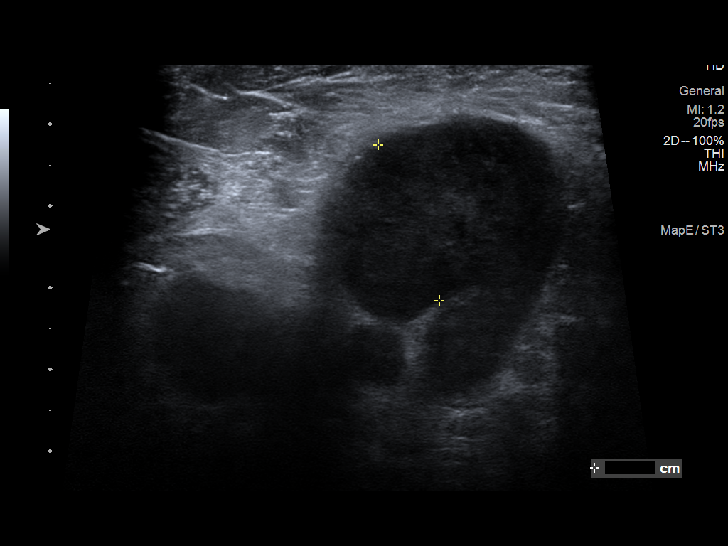
[im 24/24]
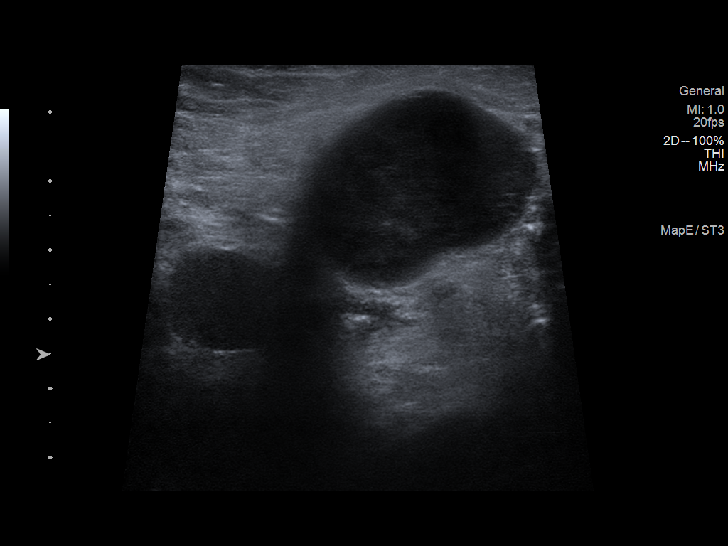

[12 of 24 positions shown; findings below may reference images not displayed]

ACR Breast Density Category c: The breast tissue is heterogeneously
dense, which may obscure small masses.
FINDINGS: Underlying the palpable marker within the left axilla is a large
mass favored to represent an enlarged lymph node.

Within the upper inner left breast posterior depth there is a
persistent irregular mass. There is a smaller oval circumscribed
mass within the left breast just lateral to the dominant mass. No
additional concerning findings within either breast.

Targeted ultrasound is performed, showing a 1.6 x 2.0 x 1.0 cm
irregular hypoechoic mass left breast 10 o'clock position 4 cm from
nipple.

There is an adjacent 5 x 4 x 5 mm irregular hypoechoic mass left
breast 12 o'clock position 4 cm from the nipple.

Additionally, in this area is a 4 x 5 x 3 mm complicated cyst left
breast 11 o'clock position 4 cm from nipple.

Palpable mass within the left axilla corresponds with a markedly
enlarged lymph node.
IMPRESSION: Suspicious left breast mass 10 o'clock position.

Adjacent suspicious satellite nodule left breast 10 o'clock
position.

Palpable mass left axilla corresponds with a large lymph node.

RECOMMENDATION:
Ultrasound-guided core needle biopsy left breast mass 10 o'clock
position.

Ultrasound-guided core needle biopsy of the adjacent suspicious
satellite nodule left breast 10 o'clock position.

Ultrasound-guided core needle biopsy of the thickened left axillary
lymph node.

I have discussed the findings and recommendations with the patient.
If applicable, a reminder letter will be sent to the patient
regarding the next appointment.

BI-RADS CATEGORY  5: Highly suggestive of malignancy.

ADDENDUM:
Addendum for correction of impression and recommendation.

In the original impression, the satellite nodule was incorrectly
labeled at the 10 o'clock position. It is at the 12 o'clock
position.

Recommendation:

Ultrasound-guided core needle biopsy left breast mass 10 o'clock
position.

Ultrasound-guided core needle biopsy adjacent suspicious satellite
nodule left breast 12 o'clock position.

Ultrasound-guided core needle biopsy of the thickened left axillary
lymph node.

*** End of Addendum ***
ACR Breast Density Category c: The breast tissue is heterogeneously
dense, which may obscure small masses.
FINDINGS: Underlying the palpable marker within the left axilla is a large
mass favored to represent an enlarged lymph node.

Within the upper inner left breast posterior depth there is a
persistent irregular mass. There is a smaller oval circumscribed
mass within the left breast just lateral to the dominant mass. No
additional concerning findings within either breast.

Targeted ultrasound is performed, showing a 1.6 x 2.0 x 1.0 cm
irregular hypoechoic mass left breast 10 o'clock position 4 cm from
nipple.

There is an adjacent 5 x 4 x 5 mm irregular hypoechoic mass left
breast 12 o'clock position 4 cm from the nipple.

Additionally, in this area is a 4 x 5 x 3 mm complicated cyst left
breast 11 o'clock position 4 cm from nipple.

Palpable mass within the left axilla corresponds with a markedly
enlarged lymph node.
IMPRESSION: Suspicious left breast mass 10 o'clock position.

Adjacent suspicious satellite nodule left breast 10 o'clock
position.

Palpable mass left axilla corresponds with a large lymph node.

RECOMMENDATION:
Ultrasound-guided core needle biopsy left breast mass 10 o'clock
position.

Ultrasound-guided core needle biopsy of the adjacent suspicious
satellite nodule left breast 10 o'clock position.

Ultrasound-guided core needle biopsy of the thickened left axillary
lymph node.

I have discussed the findings and recommendations with the patient.
If applicable, a reminder letter will be sent to the patient
regarding the next appointment.

BI-RADS CATEGORY  5: Highly suggestive of malignancy.

## 2021-10-27 ENCOUNTER — Ambulatory Visit
Admission: RE | Admit: 2021-10-27 | Discharge: 2021-10-27 | Disposition: A | Discharge status: Home / Self Care | Payer: Managed Care, Other (non HMO) | Source: Ambulatory Visit | Attending: *Deleted | Admitting: *Deleted

## 2021-10-27 DIAGNOSIS — R599 Enlarged lymph nodes, unspecified: Secondary | ICD-10-CM

## 2021-10-27 DIAGNOSIS — N6320 Unspecified lump in the left breast, unspecified quadrant: Secondary | ICD-10-CM

## 2021-10-27 IMAGING — US US BREAST BX W LOC DEV 1ST LESION IMG BX SPEC US GUIDE*L*
1 series · 16 of 23 positions shown · non-contrast
Comparison: Previous exam(s).
COMPARISON: Previous exam(s).

Addendum:
CLINICAL DATA: Patient with indeterminate left breast masses and
cortically thickened left axillary lymph node.

EXAM:
ULTRASOUND GUIDED LEFT BREAST CORE NEEDLE BIOPSY

[Series 1: us breast bx w loc dev 1st lesion img bx spec us g · 0.08mm/px · 16 of 23 slices shown]
[im 1/23]
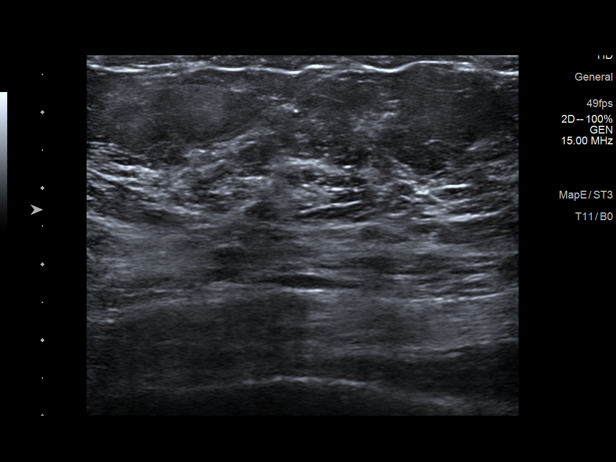
[im 3/23]
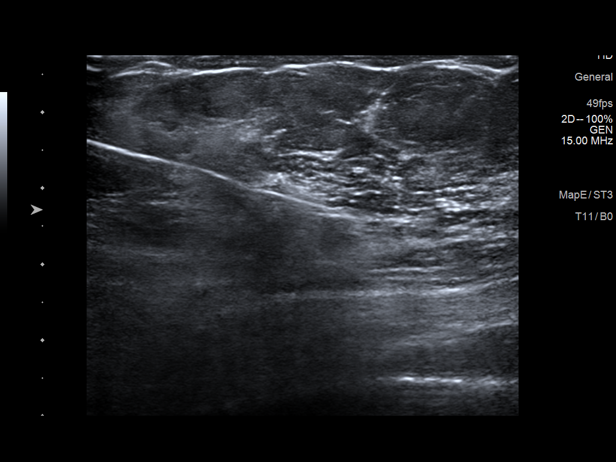
[im 4/23]
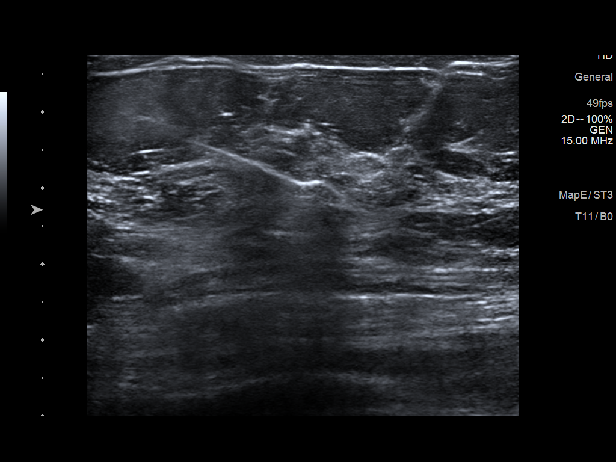
[im 6/23]
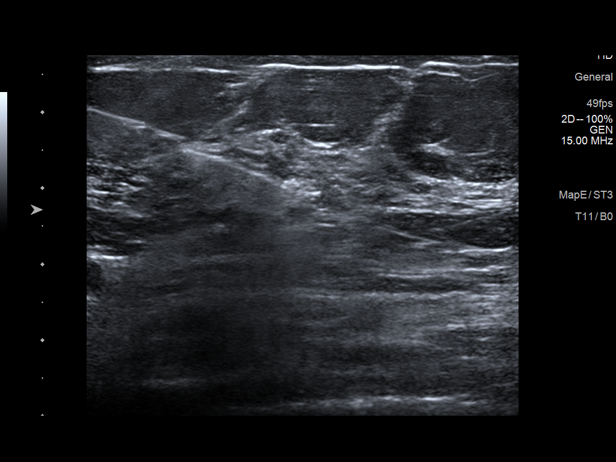
[im 7/23]
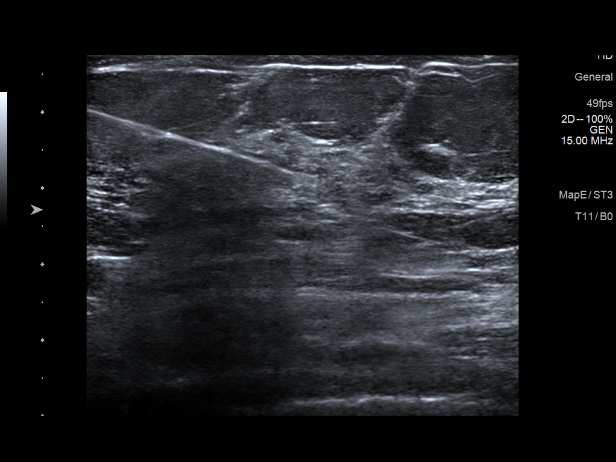
[im 8/23]
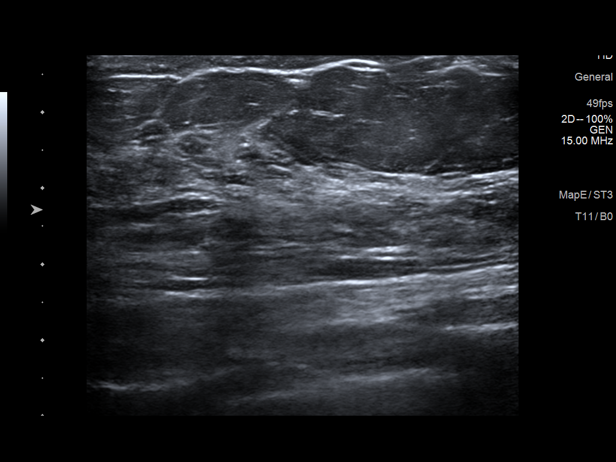
[im 10/23]
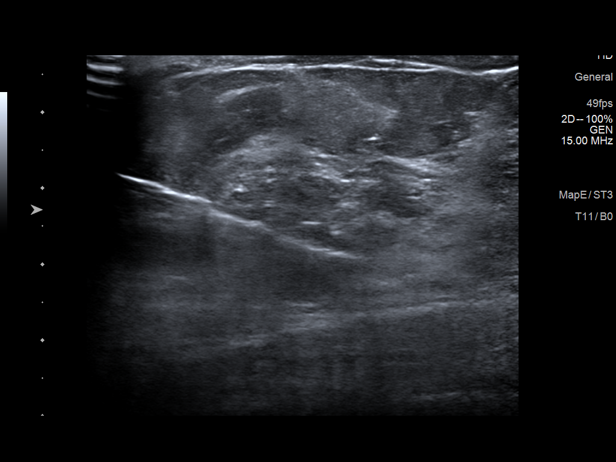
[im 11/23]
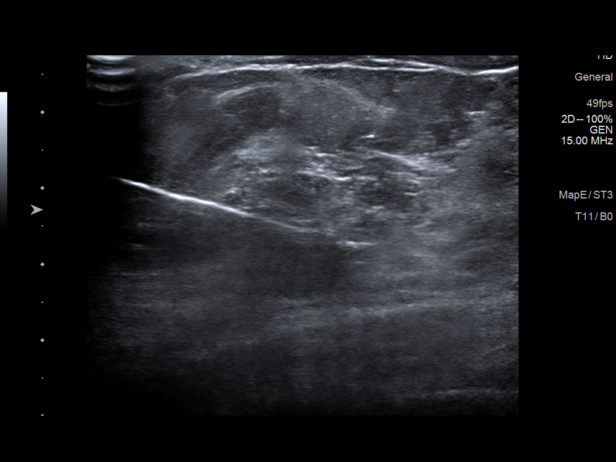
[im 13/23]
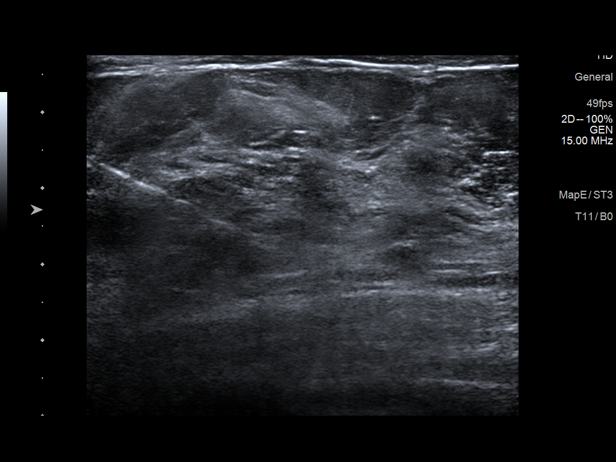
[im 14/23]
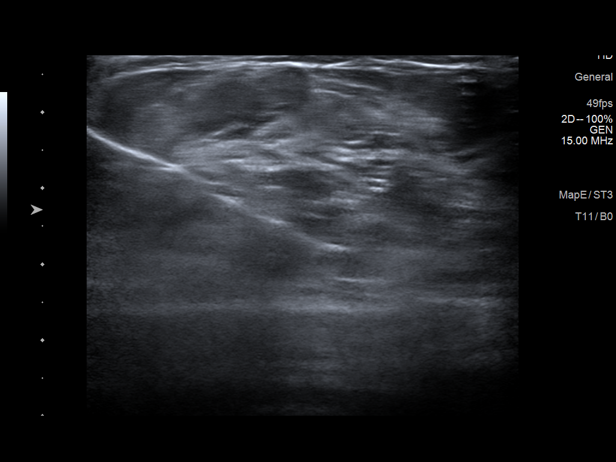
[im 16/23]
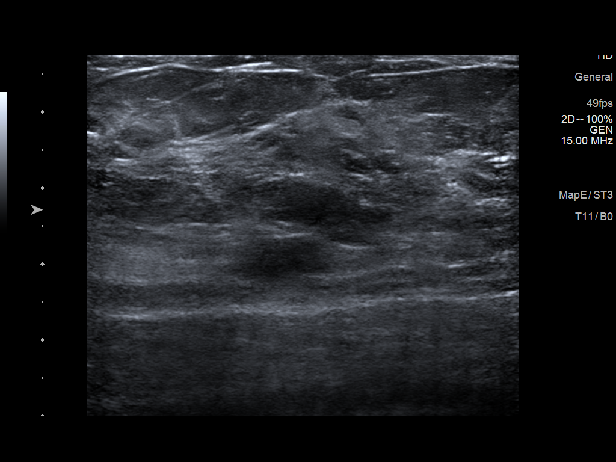
[im 17/23]
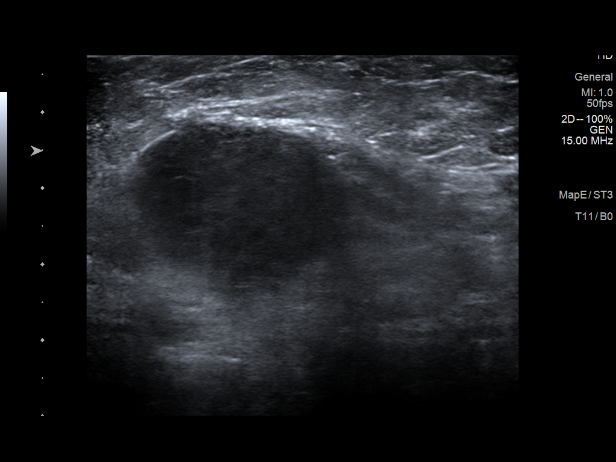
[im 18/23]
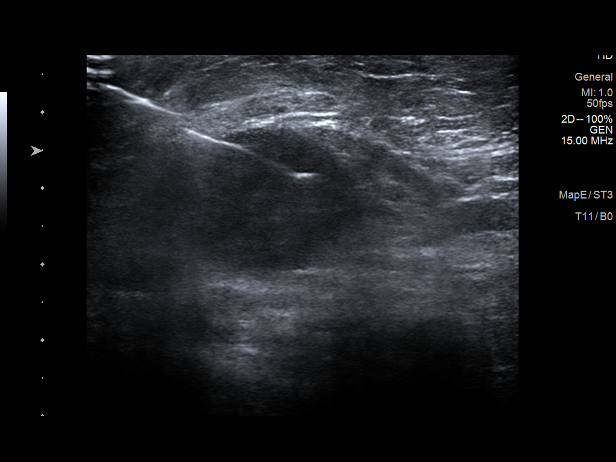
[im 20/23]
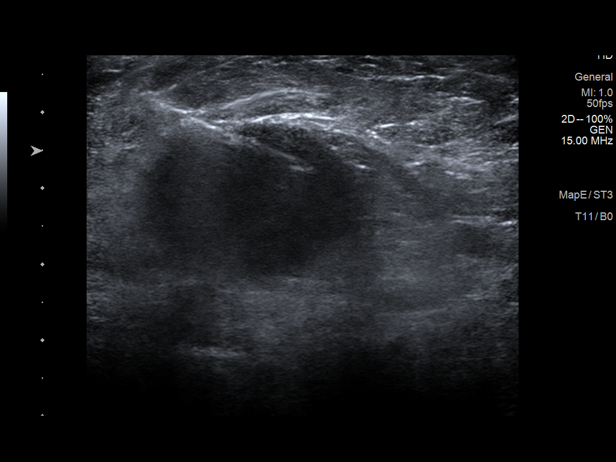
[im 21/23]
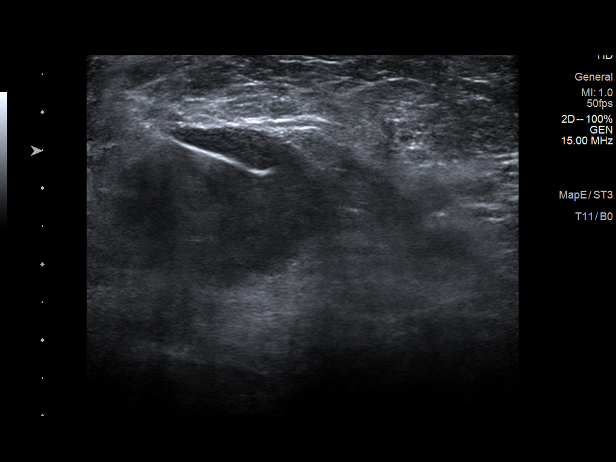
[im 23/23]
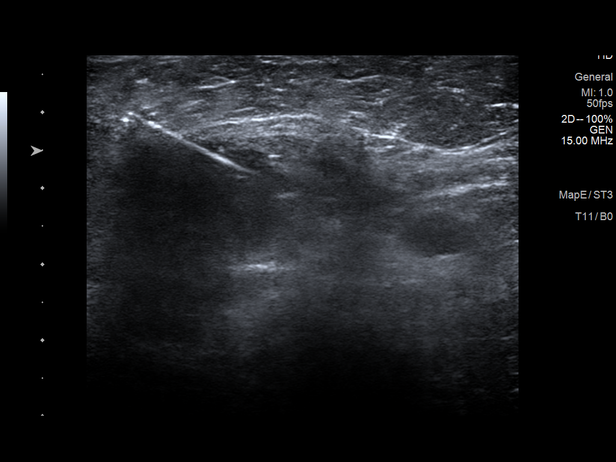

[16 of 23 positions shown; findings below may reference images not displayed]



Site 1: Left breast mass 12 o'clock position

Lesion quadrant: Upper inner quadrant

Using sterile technique and 1% Lidocaine as local anesthetic, under
direct ultrasound visualization, a 14 gauge TIGER device was
used to perform biopsy of left breast mass 12 o'clock position using
a medial approach. At the conclusion of the procedure ribbon shaped
tissue marker clip was deployed into the biopsy cavity. Follow up 2
view mammogram was performed and dictated separately.

Site 2: Left breast mass 10 o'clock position

Lesion quadrant: Upper inner quadrant

Using sterile technique and 1% Lidocaine as local anesthetic, under
direct ultrasound visualization, a 14 gauge TIGER device was
used to perform biopsy of left breast mass 10 o'clock position using
a medial approach. At the conclusion of the procedure coil shaped
tissue marker clip was deployed into the biopsy cavity. Follow up 2
view mammogram was performed and dictated separately.

Site 3: Left axillary lymph node

Lesion quadrant: Upper outer quadrant

Using sterile technique and 1% Lidocaine as local anesthetic, under
direct ultrasound visualization, a 14 gauge TIGER device was
used to perform biopsy of left axillary lymph node using a lateral
approach. At the conclusion of the procedure TIGER tissue marker
clip was deployed into the biopsy cavity. Follow up 2 view mammogram
was performed and dictated separately.
IMPRESSION: Ultrasound guided biopsy of left breast masses and left axillary
lymph node as above. No apparent complications.

ADDENDUM:
Pathology revealed FIBROADENOMA of the LEFT breast, 12 o'clock,
(ribbon clip). This was found to be discordant by Dr. TIGER,
with excision recommended.

Pathology revealed GRADE III INVASIVE DUCTAL CARCINOMA WITH
NECROSIS, DUCTAL CARCINOMA IN SITU of the LEFT breast, 10 o'clock,
(coil clip). This was found to be concordant by Dr. TIGER.

Pathology revealed INVASIVE DUCTAL CARCINOMA WITH NECROSIS, NO NODAL
TISSUE IDENTIFIED of the LEFT axilla, (tribell clip). This was found
to be concordant by Dr. TIGER.

Pathology results were discussed with the patient by telephone. The
patient reported doing well after the biopsies with tenderness at
the sites. Post biopsy instructions and care were reviewed and
questions were answered. The patient was encouraged to call The
direct phone number was provided.

The patient was referred to [REDACTED]
[REDACTED] at [REDACTED] on
[DATE].

Pathology results reported by TIGER, RN on [DATE].



Site 1: Left breast mass 12 o'clock position

Lesion quadrant: Upper inner quadrant

Using sterile technique and 1% Lidocaine as local anesthetic, under
direct ultrasound visualization, a 14 gauge TIGER device was
used to perform biopsy of left breast mass 12 o'clock position using
a medial approach. At the conclusion of the procedure ribbon shaped
tissue marker clip was deployed into the biopsy cavity. Follow up 2
view mammogram was performed and dictated separately.

Site 2: Left breast mass 10 o'clock position

Lesion quadrant: Upper inner quadrant

Using sterile technique and 1% Lidocaine as local anesthetic, under
direct ultrasound visualization, a 14 gauge TIGER device was
used to perform biopsy of left breast mass 10 o'clock position using
a medial approach. At the conclusion of the procedure coil shaped
tissue marker clip was deployed into the biopsy cavity. Follow up 2
view mammogram was performed and dictated separately.

Site 3: Left axillary lymph node

Lesion quadrant: Upper outer quadrant

Using sterile technique and 1% Lidocaine as local anesthetic, under
direct ultrasound visualization, a 14 gauge TIGER device was
used to perform biopsy of left axillary lymph node using a lateral
approach. At the conclusion of the procedure TIGER tissue marker
clip was deployed into the biopsy cavity. Follow up 2 view mammogram
was performed and dictated separately.
IMPRESSION: Ultrasound guided biopsy of left breast masses and left axillary
lymph node as above. No apparent complications.

## 2021-10-27 IMAGING — MG MM BREAST LOCALIZATION CLIP
5 series · 6 of 13 positions shown · non-contrast
Comparison: Previous exam(s).

CLINICAL DATA: Patient status post ultrasound-guided core needle
biopsy left breast masses and left axillary lymph node.

EXAM:
3D DIAGNOSTIC LEFT MAMMOGRAM POST ULTRASOUND BIOPSY

[L CC synth-2D]
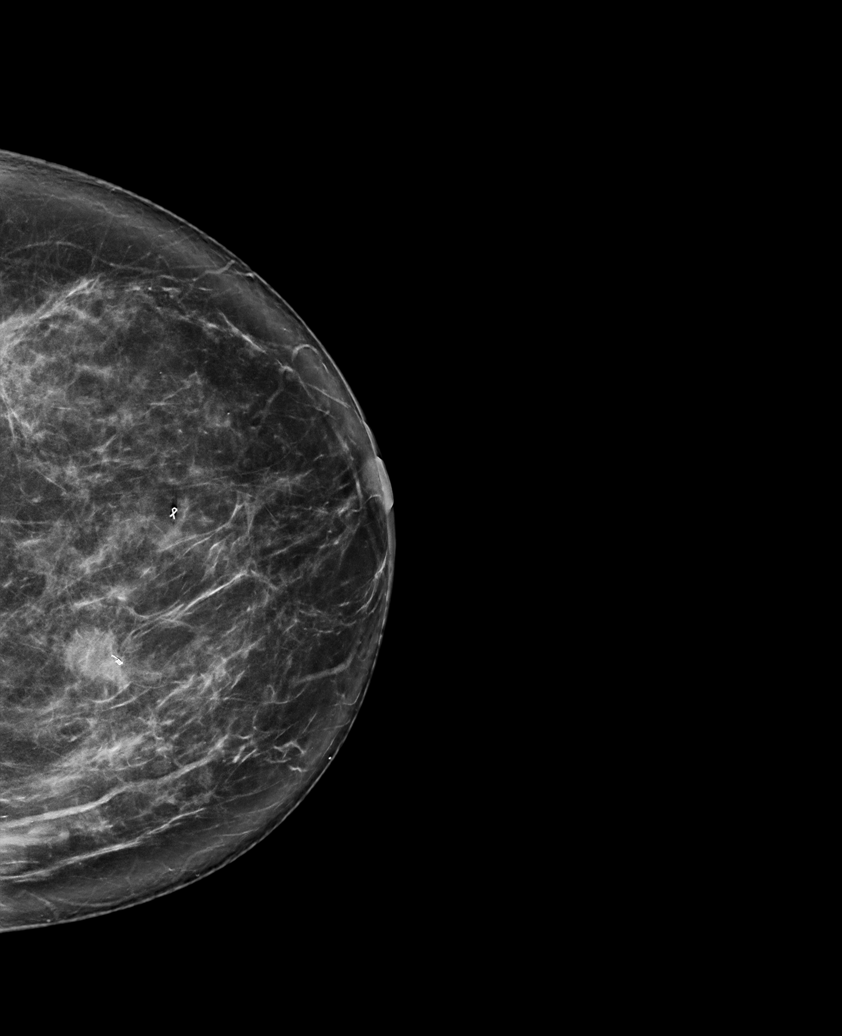

[L ML synth-2D]
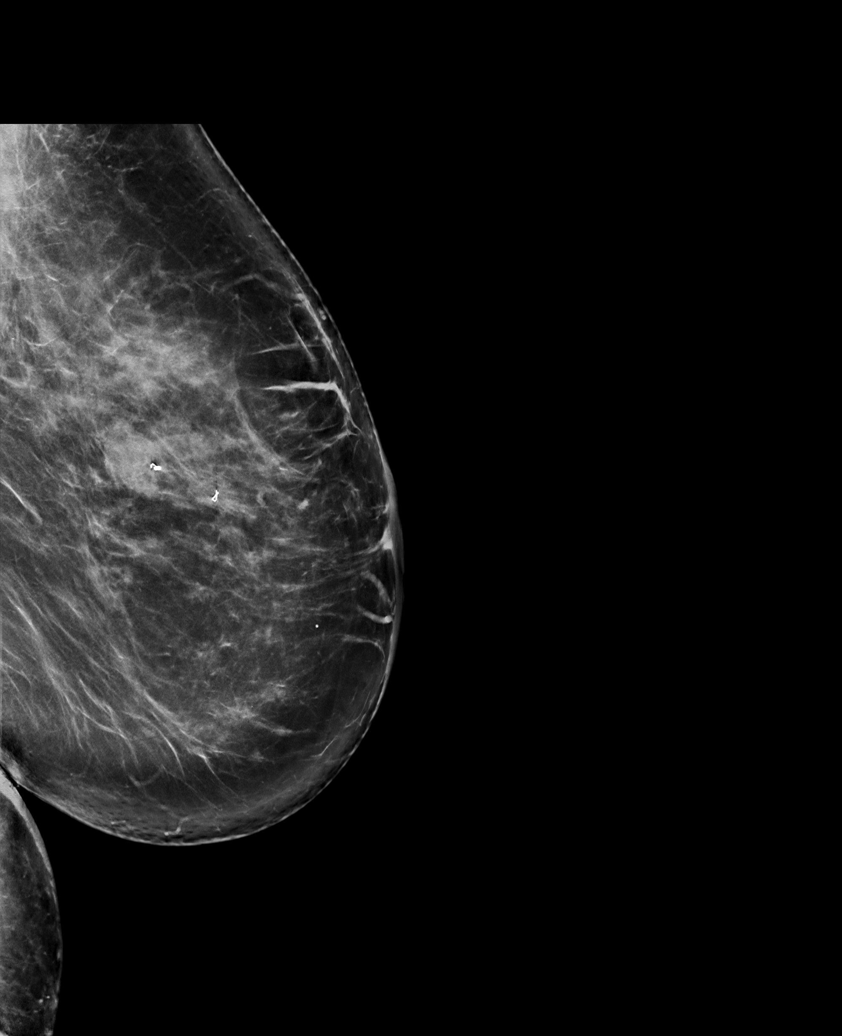

[L MLO synth-2D]
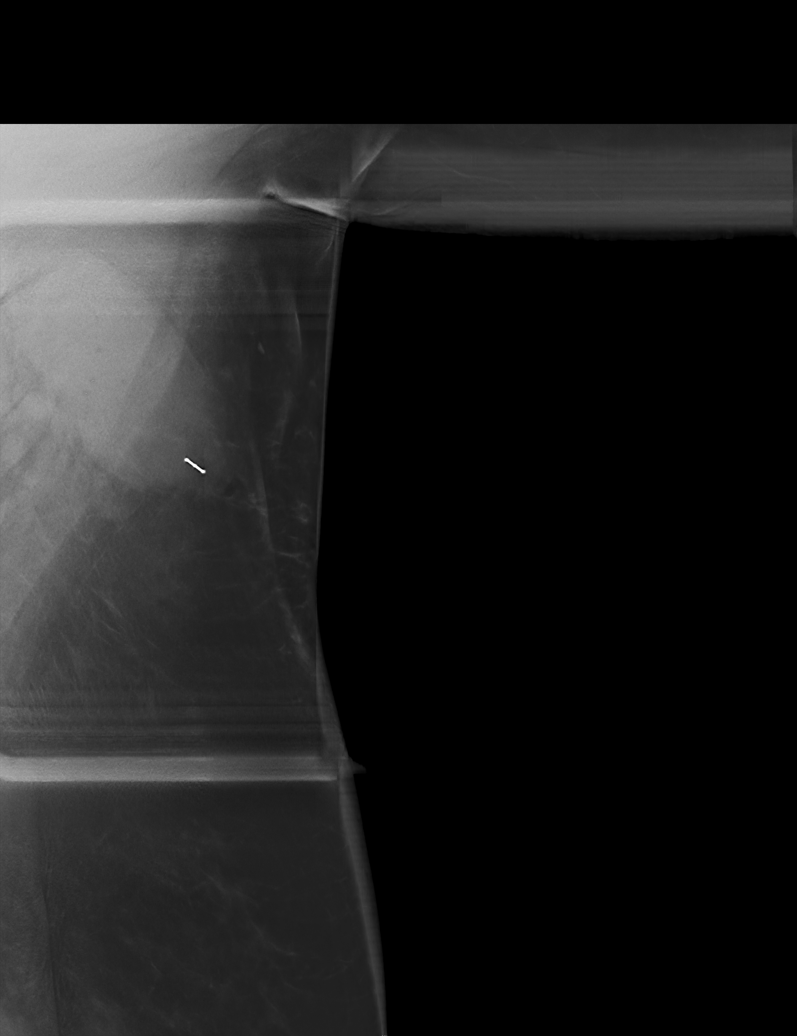

[L ML tomo · 2 of 99 frames shown]
[frame 32/99]
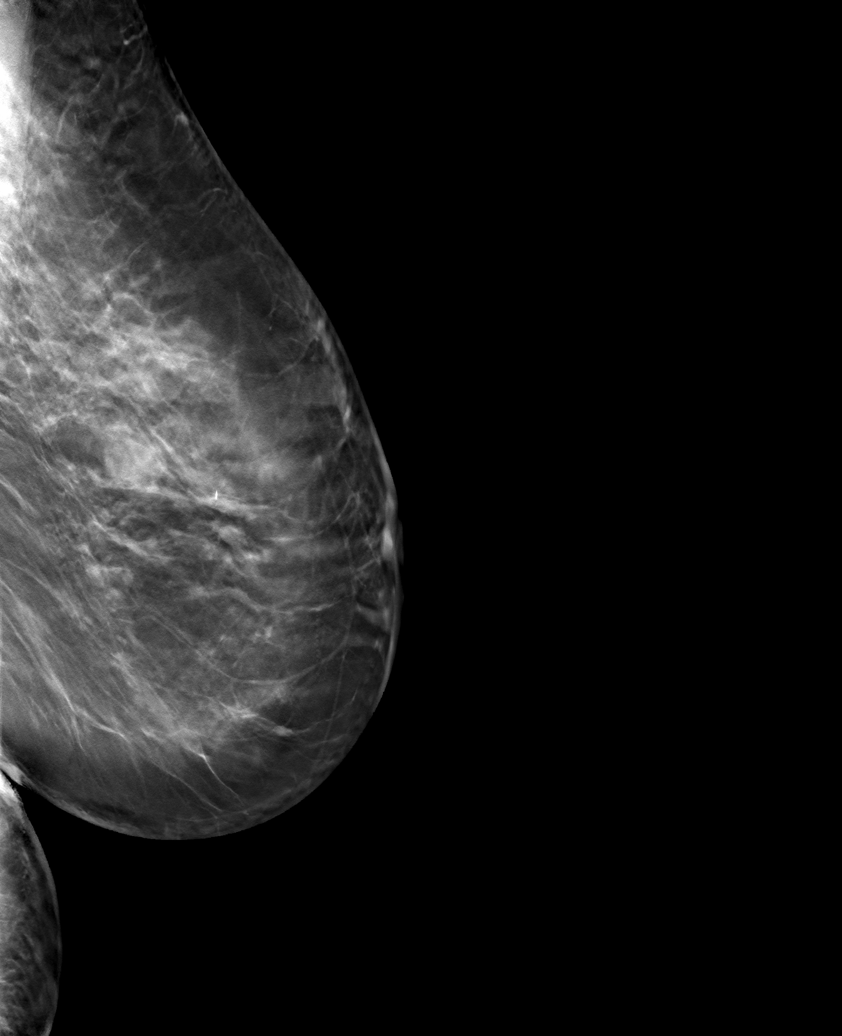
[frame 50/99]
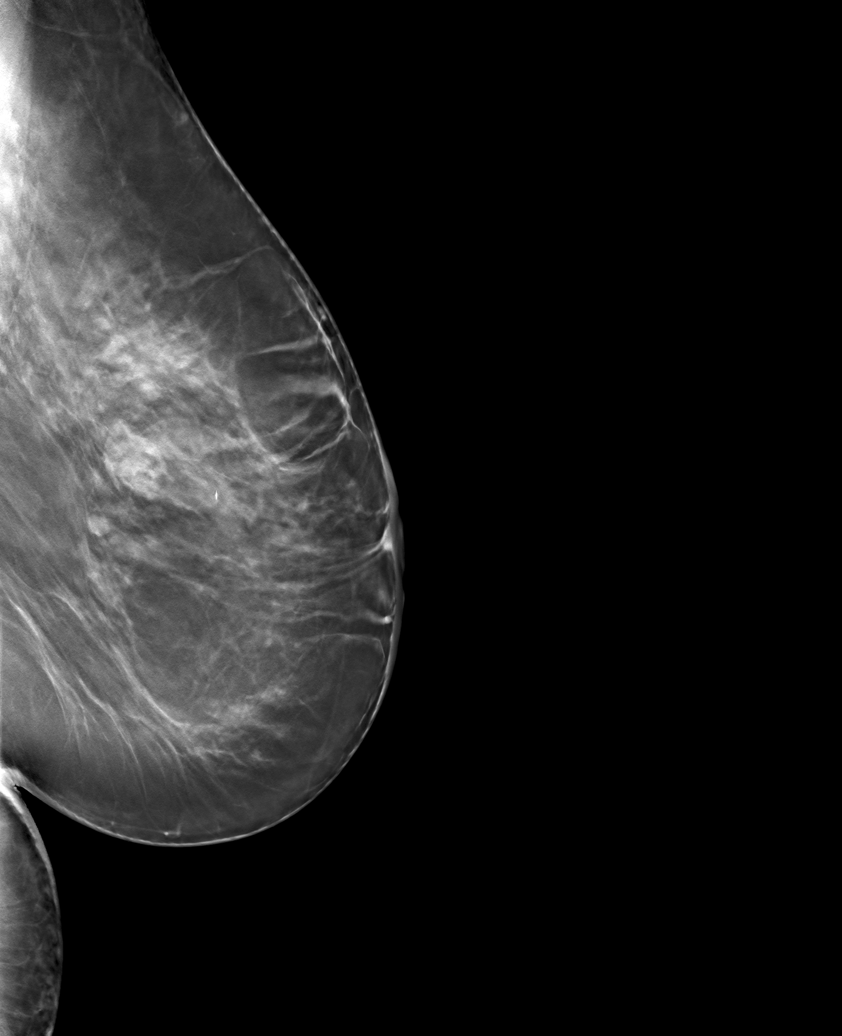

[L CC tomo · tomo slice 45/88.0]
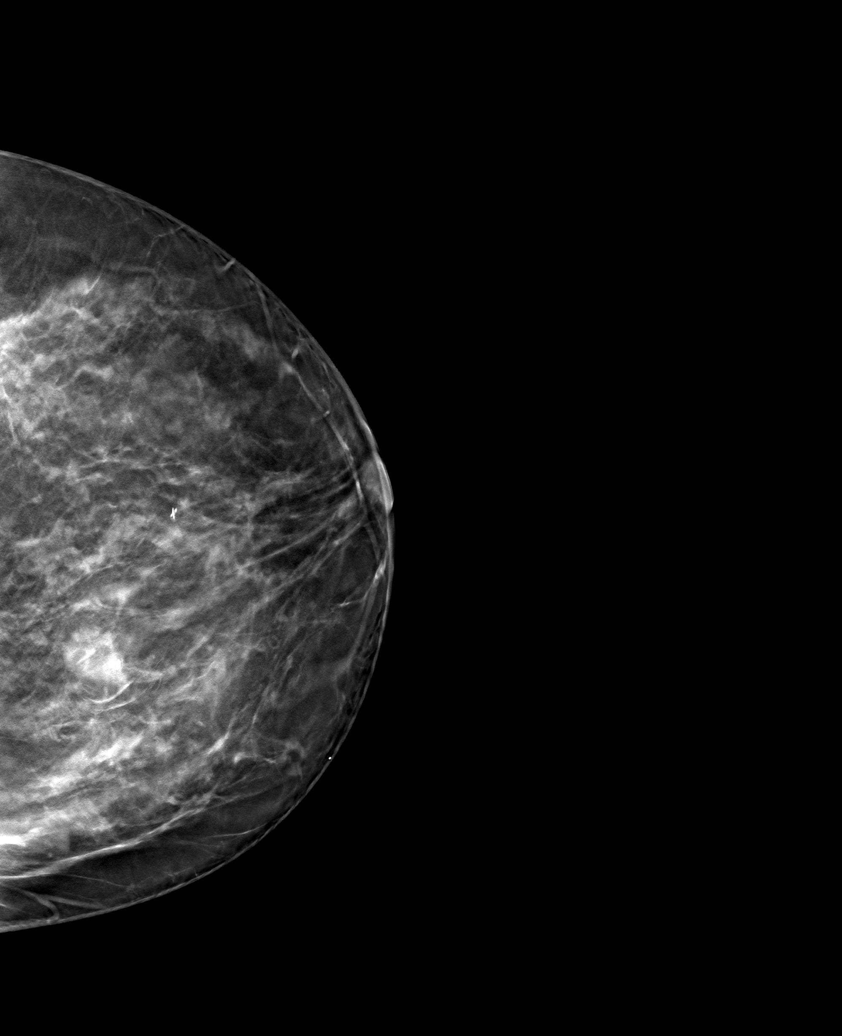

[6 of 13 positions shown; findings below may reference images not displayed]

FINDINGS: 3D Mammographic images were obtained following ultrasound guided
biopsy of left breast masses and left axillary lymph.

Site 1: Left breast mass 12 o'clock position: Ribbon shaped clip: In
appropriate position.

Site 2: Left breast mass 10 o'clock position: Coil shaped clip: In
appropriate position.

Site 3: Left axillary node: KEHR clip: In appropriate position.
IMPRESSION: Appropriate positioning of the type shaped biopsy marking clip at
the site of biopsy in the location.

Final Assessment: Post Procedure Mammograms for Marker Placement

## 2021-10-31 ENCOUNTER — Telehealth: Payer: Self-pay | Admitting: Hematology

## 2021-10-31 NOTE — Telephone Encounter (Signed)
Spoke to patient to confirm morning clinic appointment for 1/18 packet sent via mail

## 2021-11-06 ENCOUNTER — Encounter: Payer: Self-pay | Admitting: *Deleted

## 2021-11-06 DIAGNOSIS — Z170 Estrogen receptor positive status [ER+]: Secondary | ICD-10-CM | POA: Insufficient documentation

## 2021-11-06 DIAGNOSIS — C50212 Malignant neoplasm of upper-inner quadrant of left female breast: Secondary | ICD-10-CM | POA: Insufficient documentation

## 2021-11-07 NOTE — Progress Notes (Signed)
Radiation Oncology         [phone] ________________________________  Multidisciplinary Breast Oncology Clinic [facility]) Initial Outpatient Consultation  Name: Cassandra Brennan MRN: [id number]  Date: 11/08/2021  DOB: [birth year]  DE:YCXKGYJE, Joelene Millin, NP  Erroll Luna, MD   REFERRING PHYSICIAN: Erroll Luna, MD  DIAGNOSIS: The encounter diagnosis was Malignant neoplasm of upper-inner quadrant of left breast in female, estrogen receptor positive ([place]).  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 Malignant neoplasm of upper-inner quadrant of left breast in female, estrogen receptor positive ([place])  C50.212    Z17.0       HISTORY OF PRESENT ILLNESS::Cassandra Brennan is a 66 y.o. female who is presenting to the office today for evaluation of her newly diagnosed breast cancer. She is accompanied by her sister. She is doing well overall.   The patient presented with a palpable left axillary abnormality. Subsequently, she underwent bilateral diagnostic mammography with tomography and left breast ultrasonography at [facility] on 10/19/21 showing: a suspicious left breast mass, located at the 10 o'clock position, and an adjacent suspicious satellite nodule in the left breast, 10 o'clock position. The palpable mass in the left axilla was noted to correspond with a large lymph node.  Left breast biopsies on 10/27/21 showed: grade 3 invasive ductal carcinoma and DCIS with necrosis measuring 0.8 cm in the greatest linear extent from 10 o'clock biopsy, and metastatic IDC and necrosis to one left axillary lymph node. 12 o'clock biopsy revealed fibroadenoma and no evidence of malignancy . Prognostic indicators significant for: estrogen receptor, 30% positive with weak staining intensity and progesterone receptor, 0% negative. Proliferation marker Ki67  at 60%. HER2 negative.  Menarche: 66 years old Age at first live birth: 66 years old GP: 3 LMP: menopausal, did not indicate date of last period on the provided form Contraceptive: never used HRT: never used   The patient was referred today for presentation in the multidisciplinary conference.  Radiology studies and pathology slides were presented there for review and discussion of treatment options.  A consensus was discussed regarding potential next steps.  PREVIOUS RADIATION THERAPY: No  PAST MEDICAL HISTORY:  Past Medical History:  Diagnosis Date   Anxiety    Arthritis    right knee    GERD (gastroesophageal reflux disease)    HTN (hypertension)    Hyperlipidemia    Hypertension    Positive colorectal cancer screening using Cologuard test     PAST SURGICAL HISTORY: Past Surgical History:  Procedure Laterality Date   BREAST CYST EXCISION Right    pt stated in [place]     other GI testing     unsure but had to drink a chalky drink    TONSILLECTOMY     TUBAL LIGATION      FAMILY HISTORY:  Family History  Problem Relation Age of Onset   Colon polyps Sister    Colon polyps Sister    Colon polyps Sister    Colon cancer Maternal Uncle        dx. 64s   Throat cancer Maternal Uncle    Colon cancer Cousin        dx. >50   Gastric cancer Cousin        dx. >50   Breast cancer Cousin  dx. 64s   Kidney cancer Nephew    Esophageal cancer Neg Hx    Rectal cancer Neg Hx    Stomach cancer Neg Hx     SOCIAL HISTORY:  Social History   Socioeconomic History   Marital status: Married    Spouse name: Not on file   Number of children: 2   Years of education: Not on file   Highest education level: Not on file  Occupational History   Not on file  Tobacco Use   Smoking status: Never   Smokeless tobacco: Never  Substance and Sexual Activity   Alcohol use: No   Drug use: No   Sexual activity: Not on file  Other Topics Concern   Not on file   Social History Narrative   Not on file   Social Determinants of Health   Financial Resource Strain: Not on file  Food Insecurity: Not on file  Transportation Needs: Not on file  Physical Activity: Not on file  Stress: Not on file  Social Connections: Not on file    ALLERGIES:  Allergies  Allergen Reactions   Prednisone Hypertension   Other     Had a rxn to cortisone knee injection - had elevated BP and HR   Robitussin (Alcohol Free) [Guaifenesin] Hives   Sulfa Antibiotics Hives    MEDICATIONS:  Current Outpatient Medications  Medication Sig Dispense Refill   ALPRAZolam (XANAX) 0.5 MG tablet Take 0.5 mg by mouth at bedtime as needed for anxiety.     fluticasone (FLONASE) 50 MCG/ACT nasal spray fluticasone propionate 50 mcg/actuation nasal spray,suspension (Patient not taking: No sig reported)     metoprolol succinate (TOPROL-XL) 100 MG 24 hr tablet Take 1 tablet (100 mg total) by mouth daily. Take with or immediately following a meal. 30 tablet 0   pravastatin (PRAVACHOL) 20 MG tablet Take 1 tablet (20 mg total) by mouth daily. 30 tablet 0   VITAMIN D PO Take by mouth. Take infrequently (Patient not taking: Reported on 09/28/2020)     No current facility-administered medications for this encounter.    REVIEW OF SYSTEMS: A 10+ POINT REVIEW OF SYSTEMS WAS OBTAINED including neurology, dermatology, psychiatry, cardiac, respiratory, lymph, extremities, GI, GU, musculoskeletal, constitutional, reproductive, HEENT. On the provided form, she reports wearing glasses, breast pain, arthritis, anxiety, and swollen lymph glands. She denies any other symptoms.    PHYSICAL EXAM:   Vitals with BMI 11/08/2021  Height '5\' 5"'   Weight 183 lbs  BMI 20.23  Systolic 343  Diastolic 88  Pulse 568    Lungs are clear to auscultation bilaterally. Heart has regular rate and rhythm. No palpable cervical, supraclavicular, or axillary adenopathy. Abdomen soft, non-tender, normal bowel sounds. Breast:  Right breast with no palpable mass, nipple discharge, or bleeding. Left breast with steri strips in place from biopsy in the UOQ, as well as in the 1:00 position, and some palpable induration in the left axillary area. No nipple discharge or bleeding appreciated in the left breast. Axillary swelling was better appreciated when the patient was laying flat.   KPS = 90  100 - Normal; no complaints; no evidence of disease. 90   - Able to carry on normal activity; minor signs or symptoms of disease. 80   - Normal activity with effort; some signs or symptoms of disease. 28   - Cares for self; unable to carry on normal activity or to do active work. 60   - Requires occasional assistance, but is able to care  for most of his personal needs. 50   - Requires considerable assistance and frequent medical care. 35   - Disabled; requires special care and assistance. 4   - Severely disabled; hospital admission is indicated although death not imminent. 2   - Very sick; hospital admission necessary; active supportive treatment necessary. 10   - Moribund; fatal processes progressing rapidly. 0     - Dead  Karnofsky DA, Abelmann [place], Craver LS and Burchenal [facility] [phone]) The use of the nitrogen mustards in the palliative treatment of carcinoma: with particular reference to bronchogenic carcinoma Cancer 1 634-56  LABORATORY DATA:  Lab Results  Component Value Date   WBC 6.3 11/08/2021   HGB 12.0 11/08/2021   HCT 35.7 (L) 11/08/2021   MCV 82.8 11/08/2021   PLT 282 11/08/2021   Lab Results  Component Value Date   NA 140 11/08/2021   K 3.6 11/08/2021   CL 106 11/08/2021   CO2 26 11/08/2021   Lab Results  Component Value Date   ALT 25 11/08/2021   AST 14 (L) 11/08/2021   ALKPHOS 100 11/08/2021   BILITOT 0.4 11/08/2021    PULMONARY FUNCTION TEST:   Recent Review Flowsheet Data   There is no flowsheet data to display.     RADIOGRAPHY: US BREAST LTD UNI LEFT INC AXILLA  Addendum Date: 10/27/2021    ADDENDUM REPORT: 10/27/2021 10:50 ADDENDUM: Addendum for correction of impression and recommendation. In the original impression, the satellite nodule was incorrectly labeled at the 10 o'clock position. It is at the 12 o'clock position. Recommendation: Ultrasound-guided core needle biopsy left breast mass 10 o'clock position. Ultrasound-guided core needle biopsy adjacent suspicious satellite nodule left breast 12 o'clock position. Ultrasound-guided core needle biopsy of the thickened left axillary lymph node. Electronically Signed   By: Lovey Newcomer M.D.   On: 10/27/2021 10:50   Result Date: 10/27/2021 CLINICAL DATA:  Patient presents for palpable left axillary abnormality. EXAM: DIGITAL DIAGNOSTIC BILATERAL MAMMOGRAM WITH TOMOSYNTHESIS AND CAD; ULTRASOUND LEFT BREAST LIMITED TECHNIQUE: Bilateral digital diagnostic mammography and breast tomosynthesis was performed. The images were evaluated with computer-aided detection.; Targeted ultrasound examination of the left breast was performed. COMPARISON:  None. ACR Breast Density Category c: The breast tissue is heterogeneously dense, which may obscure small masses. FINDINGS: Underlying the palpable marker within the left axilla is a large mass favored to represent an enlarged lymph node. Within the upper inner left breast posterior depth there is a persistent irregular mass. There is a smaller oval circumscribed mass within the left breast just lateral to the dominant mass. No additional concerning findings within either breast. Targeted ultrasound is performed, showing a 1.6 x 2.0 x 1.0 cm irregular hypoechoic mass left breast 10 o'clock position 4 cm from nipple. There is an adjacent 5 x 4 x 5 mm irregular hypoechoic mass left breast 12 o'clock position 4 cm from the nipple. Additionally, in this area is a 4 x 5 x 3 mm complicated cyst left breast 11 o'clock position 4 cm from nipple. Palpable mass within the left axilla corresponds with a markedly enlarged lymph  node. IMPRESSION: Suspicious left breast mass 10 o'clock position. Adjacent suspicious satellite nodule left breast 10 o'clock position. Palpable mass left axilla corresponds with a large lymph node. RECOMMENDATION: Ultrasound-guided core needle biopsy left breast mass 10 o'clock position. Ultrasound-guided core needle biopsy of the adjacent suspicious satellite nodule left breast 10 o'clock position. Ultrasound-guided core needle biopsy of the thickened left axillary lymph node. I have discussed  the findings and recommendations with the patient. If applicable, a reminder letter will be sent to the patient regarding the next appointment. BI-RADS CATEGORY  5: Highly suggestive of malignancy. Electronically Signed: By: Lovey Newcomer M.D. On: 10/19/2021 16:23  MM DIAG BREAST TOMO BILATERAL  Addendum Date: 10/27/2021   ADDENDUM REPORT: 10/27/2021 10:50 ADDENDUM: Addendum for correction of impression and recommendation. In the original impression, the satellite nodule was incorrectly labeled at the 10 o'clock position. It is at the 12 o'clock position. Recommendation: Ultrasound-guided core needle biopsy left breast mass 10 o'clock position. Ultrasound-guided core needle biopsy adjacent suspicious satellite nodule left breast 12 o'clock position. Ultrasound-guided core needle biopsy of the thickened left axillary lymph node. Electronically Signed   By: Lovey Newcomer M.D.   On: 10/27/2021 10:50   Result Date: 10/27/2021 CLINICAL DATA:  Patient presents for palpable left axillary abnormality. EXAM: DIGITAL DIAGNOSTIC BILATERAL MAMMOGRAM WITH TOMOSYNTHESIS AND CAD; ULTRASOUND LEFT BREAST LIMITED TECHNIQUE: Bilateral digital diagnostic mammography and breast tomosynthesis was performed. The images were evaluated with computer-aided detection.; Targeted ultrasound examination of the left breast was performed. COMPARISON:  None. ACR Breast Density Category c: The breast tissue is heterogeneously dense, which may obscure small  masses. FINDINGS: Underlying the palpable marker within the left axilla is a large mass favored to represent an enlarged lymph node. Within the upper inner left breast posterior depth there is a persistent irregular mass. There is a smaller oval circumscribed mass within the left breast just lateral to the dominant mass. No additional concerning findings within either breast. Targeted ultrasound is performed, showing a 1.6 x 2.0 x 1.0 cm irregular hypoechoic mass left breast 10 o'clock position 4 cm from nipple. There is an adjacent 5 x 4 x 5 mm irregular hypoechoic mass left breast 12 o'clock position 4 cm from the nipple. Additionally, in this area is a 4 x 5 x 3 mm complicated cyst left breast 11 o'clock position 4 cm from nipple. Palpable mass within the left axilla corresponds with a markedly enlarged lymph node. IMPRESSION: Suspicious left breast mass 10 o'clock position. Adjacent suspicious satellite nodule left breast 10 o'clock position. Palpable mass left axilla corresponds with a large lymph node. RECOMMENDATION: Ultrasound-guided core needle biopsy left breast mass 10 o'clock position. Ultrasound-guided core needle biopsy of the adjacent suspicious satellite nodule left breast 10 o'clock position. Ultrasound-guided core needle biopsy of the thickened left axillary lymph node. I have discussed the findings and recommendations with the patient. If applicable, a reminder letter will be sent to the patient regarding the next appointment. BI-RADS CATEGORY  5: Highly suggestive of malignancy. Electronically Signed: By: Lovey Newcomer M.D. On: 10/19/2021 16:23  [place] AXILLARY NODE CORE BIOPSY LEFT  Addendum Date: 10/30/2021   ADDENDUM REPORT: 10/30/2021 16:45 ADDENDUM: Pathology revealed FIBROADENOMA of the LEFT breast, 12 o'clock, (ribbon clip). This was found to be discordant by Dr. Lovey Newcomer, with excision recommended. Pathology revealed GRADE III INVASIVE DUCTAL CARCINOMA WITH NECROSIS, DUCTAL CARCINOMA IN  SITU of the LEFT breast, 10 o'clock, (coil clip). This was found to be concordant by Dr. Lovey Newcomer. Pathology revealed INVASIVE DUCTAL CARCINOMA WITH NECROSIS, NO NODAL TISSUE IDENTIFIED of the LEFT axilla, (tribell clip). This was found to be concordant by Dr. Lovey Newcomer. Pathology results were discussed with the patient by telephone. The patient reported doing well after the biopsies with tenderness at the sites. Post biopsy instructions and care were reviewed and questions were answered. The patient was encouraged to call [facility] [place]  [facility] for any additional concerns. My direct phone number was provided. The patient was referred to [facility] on November 08, 2021. Pathology results reported by Terie Purser, RN on 10/30/2021. Electronically Signed   By: Lovey Newcomer M.D.   On: 10/30/2021 16:45   Result Date: 10/30/2021 CLINICAL DATA:  Patient with indeterminate left breast masses and cortically thickened left axillary lymph node. EXAM: ULTRASOUND GUIDED LEFT BREAST CORE NEEDLE BIOPSY COMPARISON:  Previous exam(s). PROCEDURE: I met with the patient and we discussed the procedure of ultrasound-guided biopsy, including benefits and alternatives. We discussed the high likelihood of a successful procedure. We discussed the risks of the procedure, including infection, bleeding, tissue injury, clip migration, and inadequate sampling. Informed written consent was given. The usual time-out protocol was performed immediately prior to the procedure. Site 1: Left breast mass 12 o'clock position Lesion quadrant: Upper inner quadrant Using sterile technique and 1% Lidocaine as local anesthetic, under direct ultrasound visualization, a 14 gauge spring-loaded device was used to perform biopsy of left breast mass 12 o'clock position using a medial approach. At the conclusion of the procedure ribbon shaped tissue marker clip was  deployed into the biopsy cavity. Follow up 2 view mammogram was performed and dictated separately. Site 2: Left breast mass 10 o'clock position Lesion quadrant: Upper inner quadrant Using sterile technique and 1% Lidocaine as local anesthetic, under direct ultrasound visualization, a 14 gauge spring-loaded device was used to perform biopsy of left breast mass 10 o'clock position using a medial approach. At the conclusion of the procedure coil shaped tissue marker clip was deployed into the biopsy cavity. Follow up 2 view mammogram was performed and dictated separately. Site 3: Left axillary lymph node Lesion quadrant: Upper outer quadrant Using sterile technique and 1% Lidocaine as local anesthetic, under direct ultrasound visualization, a 14 gauge spring-loaded device was used to perform biopsy of left axillary lymph node using a lateral approach. At the conclusion of the procedure tri bell tissue marker clip was deployed into the biopsy cavity. Follow up 2 view mammogram was performed and dictated separately. IMPRESSION: Ultrasound guided biopsy of left breast masses and left axillary lymph node as above. No apparent complications. Electronically Signed: By: Lovey Newcomer M.D. On: 10/27/2021 10:47  MM CLIP PLACEMENT LEFT  Result Date: 10/27/2021 CLINICAL DATA:  Patient status post ultrasound-guided core needle biopsy left breast masses and left axillary lymph node. EXAM: 3D DIAGNOSTIC LEFT MAMMOGRAM POST ULTRASOUND BIOPSY COMPARISON:  Previous exam(s). FINDINGS: 3D Mammographic images were obtained following ultrasound guided biopsy of left breast masses and left axillary lymph. Site 1: Left breast mass 12 o'clock position: Ribbon shaped clip: In appropriate position. Site 2: Left breast mass 10 o'clock position: Coil shaped clip: In appropriate position. Site 3: Left axillary node: Tri bell clip: In appropriate position. IMPRESSION: Appropriate positioning of the type shaped biopsy marking clip at the site of  biopsy in the location. Final Assessment: Post Procedure Mammograms for Marker Placement Electronically Signed   By: Lovey Newcomer M.D.   On: 10/27/2021 10:49  [place] LT BREAST BX W LOC DEV 1ST LESION IMG BX SPEC US GUIDE  Addendum Date: 10/30/2021   ADDENDUM REPORT: 10/30/2021 16:45 ADDENDUM: Pathology revealed FIBROADENOMA of the LEFT breast, 12 o'clock, (ribbon clip). This was found to be discordant by Dr. Lovey Newcomer, with excision recommended. Pathology revealed GRADE III INVASIVE DUCTAL CARCINOMA WITH NECROSIS, DUCTAL CARCINOMA IN SITU of the LEFT  breast, 10 o'clock, (coil clip). This was found to be concordant by Dr. Lovey Newcomer. Pathology revealed INVASIVE DUCTAL CARCINOMA WITH NECROSIS, NO NODAL TISSUE IDENTIFIED of the LEFT axilla, (tribell clip). This was found to be concordant by Dr. Lovey Newcomer. Pathology results were discussed with the patient by telephone. The patient reported doing well after the biopsies with tenderness at the sites. Post biopsy instructions and care were reviewed and questions were answered. The patient was encouraged to call [facility] for any additional concerns. My direct phone number was provided. The patient was referred to [facility] on November 08, 2021. Pathology results reported by Terie Purser, RN on 10/30/2021. Electronically Signed   By: Lovey Newcomer M.D.   On: 10/30/2021 16:45   Result Date: 10/30/2021 CLINICAL DATA:  Patient with indeterminate left breast masses and cortically thickened left axillary lymph node. EXAM: ULTRASOUND GUIDED LEFT BREAST CORE NEEDLE BIOPSY COMPARISON:  Previous exam(s). PROCEDURE: I met with the patient and we discussed the procedure of ultrasound-guided biopsy, including benefits and alternatives. We discussed the high likelihood of a successful procedure. We discussed the risks of the procedure, including infection, bleeding, tissue injury,  clip migration, and inadequate sampling. Informed written consent was given. The usual time-out protocol was performed immediately prior to the procedure. Site 1: Left breast mass 12 o'clock position Lesion quadrant: Upper inner quadrant Using sterile technique and 1% Lidocaine as local anesthetic, under direct ultrasound visualization, a 14 gauge spring-loaded device was used to perform biopsy of left breast mass 12 o'clock position using a medial approach. At the conclusion of the procedure ribbon shaped tissue marker clip was deployed into the biopsy cavity. Follow up 2 view mammogram was performed and dictated separately. Site 2: Left breast mass 10 o'clock position Lesion quadrant: Upper inner quadrant Using sterile technique and 1% Lidocaine as local anesthetic, under direct ultrasound visualization, a 14 gauge spring-loaded device was used to perform biopsy of left breast mass 10 o'clock position using a medial approach. At the conclusion of the procedure coil shaped tissue marker clip was deployed into the biopsy cavity. Follow up 2 view mammogram was performed and dictated separately. Site 3: Left axillary lymph node Lesion quadrant: Upper outer quadrant Using sterile technique and 1% Lidocaine as local anesthetic, under direct ultrasound visualization, a 14 gauge spring-loaded device was used to perform biopsy of left axillary lymph node using a lateral approach. At the conclusion of the procedure tri bell tissue marker clip was deployed into the biopsy cavity. Follow up 2 view mammogram was performed and dictated separately. IMPRESSION: Ultrasound guided biopsy of left breast masses and left axillary lymph node as above. No apparent complications. Electronically Signed: By: Lovey Newcomer M.D. On: 10/27/2021 10:47  [place] LT BREAST BX W LOC DEV EA ADD LESION IMG BX SPEC US GUIDE  Addendum Date: 10/30/2021   ADDENDUM REPORT: 10/30/2021 16:45 ADDENDUM: Pathology revealed FIBROADENOMA of the LEFT breast, 12  o'clock, (ribbon clip). This was found to be discordant by Dr. Lovey Newcomer, with excision recommended. 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 This was found to be concordant by Dr. Lovey Newcomer. Pathology revealed INVASIVE DUCTAL CARCINOMA WITH NECROSIS, NO NODAL TISSUE IDENTIFIED of the LEFT axilla, (tribell clip). This was found to be concordant by Dr. Lovey Newcomer. Pathology results were discussed with the patient by telephone.  The patient reported doing well after the biopsies with tenderness at the sites. Post biopsy instructions and care were reviewed and questions were answered. The patient was encouraged to call [facility] for any additional concerns. My direct phone number was provided. The patient was referred to [facility] on November 08, 2021. Pathology results reported by Terie Purser, RN on 10/30/2021. Electronically Signed   By: Lovey Newcomer M.D.   On: 10/30/2021 16:45   Result Date: 10/30/2021 CLINICAL DATA:  Patient with indeterminate left breast masses and cortically thickened left axillary lymph node. EXAM: ULTRASOUND GUIDED LEFT BREAST CORE NEEDLE BIOPSY COMPARISON:  Previous exam(s). PROCEDURE: I met with the patient and we discussed the procedure of ultrasound-guided biopsy, including benefits and alternatives. We discussed the high likelihood of a successful procedure. We discussed the risks of the procedure, including infection, bleeding, tissue injury, clip migration, and inadequate sampling. Informed written consent was given. The usual time-out protocol was performed immediately prior to the procedure. Site 1: Left breast mass 12 o'clock position Lesion quadrant: Upper inner quadrant Using sterile technique and 1% Lidocaine as local anesthetic, under direct ultrasound visualization, a 14 gauge  spring-loaded device was used to perform biopsy of left breast mass 12 o'clock position using a medial approach. At the conclusion of the procedure ribbon shaped tissue marker clip was deployed into the biopsy cavity. Follow up 2 view mammogram was performed and dictated separately. Site 2: Left breast mass 10 o'clock position Lesion quadrant: Upper inner quadrant Using sterile technique and 1% Lidocaine as local anesthetic, under direct ultrasound visualization, a 14 gauge spring-loaded device was used to perform biopsy of left breast mass 10 o'clock position using a medial approach. At the conclusion of the procedure coil shaped tissue marker clip was deployed into the biopsy cavity. Follow up 2 view mammogram was performed and dictated separately. Site 3: Left axillary lymph node Lesion quadrant: Upper outer quadrant Using sterile technique and 1% Lidocaine as local anesthetic, under direct ultrasound visualization, a 14 gauge spring-loaded device was used to perform biopsy of left axillary lymph node using a lateral approach. At the conclusion of the procedure tri bell tissue marker clip was deployed into the biopsy cavity. Follow up 2 view mammogram was performed and dictated separately. IMPRESSION: Ultrasound guided biopsy of left breast masses and left axillary lymph node as above. No apparent complications. Electronically Signed: By: Lovey Newcomer M.D. 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 We discussed the general course of radiation, potential side effects, and toxicities with radiation and the patient is interested in this approach.    PLAN:  Genetics MRI CT bone scan Neoadjuvant chemo/ port Left lumpectomy and targeted axillary node  dissection  Adjuvant radiation therapy  Aromatase inhibitor    ------------------------------------------------  Blair Promise, PhD, MD  This document serves as a record of services personally performed by Gery Pray, MD. It was created on his behalf by Roney Mans, a trained medical scribe. The creation of this record is based on the scribe's personal observations and the provider's statements to them. This document has been checked and approved by the attending provider.

## 2021-11-08 ENCOUNTER — Inpatient Hospital Stay: Payer: Managed Care, Other (non HMO)

## 2021-11-08 ENCOUNTER — Other Ambulatory Visit: Payer: Self-pay

## 2021-11-08 ENCOUNTER — Inpatient Hospital Stay: Payer: Managed Care, Other (non HMO) | Admitting: Licensed Clinical Social Worker

## 2021-11-08 ENCOUNTER — Encounter: Payer: Self-pay | Admitting: Hematology

## 2021-11-08 ENCOUNTER — Inpatient Hospital Stay (HOSPITAL_BASED_OUTPATIENT_CLINIC_OR_DEPARTMENT_OTHER): Payer: Managed Care, Other (non HMO) | Admitting: Hematology

## 2021-11-08 ENCOUNTER — Ambulatory Visit
Admission: RE | Admit: 2021-11-08 | Discharge: 2021-11-08 | Disposition: A | Discharge status: Home / Self Care | Payer: Managed Care, Other (non HMO) | Source: Ambulatory Visit | Attending: Radiation Oncology | Admitting: Radiation Oncology

## 2021-11-08 ENCOUNTER — Encounter: Payer: Self-pay | Admitting: *Deleted

## 2021-11-08 ENCOUNTER — Ambulatory Visit: Payer: Managed Care, Other (non HMO) | Attending: Surgery | Admitting: Physical Therapy

## 2021-11-08 ENCOUNTER — Encounter: Payer: Self-pay | Admitting: Physical Therapy

## 2021-11-08 ENCOUNTER — Other Ambulatory Visit: Payer: Self-pay | Admitting: *Deleted

## 2021-11-08 ENCOUNTER — Inpatient Hospital Stay (HOSPITAL_BASED_OUTPATIENT_CLINIC_OR_DEPARTMENT_OTHER): Payer: Managed Care, Other (non HMO) | Admitting: Genetic Counselor

## 2021-11-08 ENCOUNTER — Encounter: Payer: Self-pay | Admitting: Genetic Counselor

## 2021-11-08 ENCOUNTER — Ambulatory Visit: Payer: Self-pay | Admitting: Surgery

## 2021-11-08 VITALS — BP 158/88 | HR 102 | Temp 97.5°F | Resp 18 | Ht 65.0 in | Wt 183.0 lb

## 2021-11-08 DIAGNOSIS — Z803 Family history of malignant neoplasm of breast: Secondary | ICD-10-CM | POA: Insufficient documentation

## 2021-11-08 DIAGNOSIS — Z170 Estrogen receptor positive status [ER+]: Secondary | ICD-10-CM | POA: Insufficient documentation

## 2021-11-08 DIAGNOSIS — Z800 Family history of malignant neoplasm of digestive organs: Secondary | ICD-10-CM | POA: Insufficient documentation

## 2021-11-08 DIAGNOSIS — C50212 Malignant neoplasm of upper-inner quadrant of left female breast: Secondary | ICD-10-CM | POA: Insufficient documentation

## 2021-11-08 DIAGNOSIS — Z8051 Family history of malignant neoplasm of kidney: Secondary | ICD-10-CM | POA: Insufficient documentation

## 2021-11-08 DIAGNOSIS — I10 Essential (primary) hypertension: Secondary | ICD-10-CM | POA: Insufficient documentation

## 2021-11-08 DIAGNOSIS — C773 Secondary and unspecified malignant neoplasm of axilla and upper limb lymph nodes: Secondary | ICD-10-CM

## 2021-11-08 DIAGNOSIS — Z8371 Family history of colonic polyps: Secondary | ICD-10-CM | POA: Insufficient documentation

## 2021-11-08 DIAGNOSIS — Z882 Allergy status to sulfonamides status: Secondary | ICD-10-CM

## 2021-11-08 DIAGNOSIS — Z888 Allergy status to other drugs, medicaments and biological substances status: Secondary | ICD-10-CM | POA: Insufficient documentation

## 2021-11-08 DIAGNOSIS — Z79899 Other long term (current) drug therapy: Secondary | ICD-10-CM

## 2021-11-08 DIAGNOSIS — E7800 Pure hypercholesterolemia, unspecified: Secondary | ICD-10-CM | POA: Insufficient documentation

## 2021-11-08 DIAGNOSIS — M1711 Unilateral primary osteoarthritis, right knee: Secondary | ICD-10-CM

## 2021-11-08 DIAGNOSIS — F419 Anxiety disorder, unspecified: Secondary | ICD-10-CM | POA: Insufficient documentation

## 2021-11-08 DIAGNOSIS — R293 Abnormal posture: Secondary | ICD-10-CM | POA: Diagnosis present

## 2021-11-08 DIAGNOSIS — Z808 Family history of malignant neoplasm of other organs or systems: Secondary | ICD-10-CM | POA: Insufficient documentation

## 2021-11-08 DIAGNOSIS — R531 Weakness: Secondary | ICD-10-CM

## 2021-11-08 DIAGNOSIS — N6002 Solitary cyst of left breast: Secondary | ICD-10-CM | POA: Insufficient documentation

## 2021-11-08 LAB — CMP (CANCER CENTER ONLY)
ALT: 25 U/L (ref 0–44)
AST: 14 U/L — ABNORMAL LOW (ref 15–41)
Albumin: 4.3 g/dL (ref 3.5–5.0)
Alkaline Phosphatase: 100 U/L (ref 38–126)
Anion gap: 8 (ref 5–15)
BUN: 15 mg/dL (ref 8–23)
CO2: 26 mmol/L (ref 22–32)
Calcium: 9.6 mg/dL (ref 8.9–10.3)
Chloride: 106 mmol/L (ref 98–111)
Creatinine: 0.87 mg/dL (ref 0.44–1.00)
GFR, Estimated: >60 mL/min (ref >60)
Glucose, Bld: 134 mg/dL — ABNORMAL HIGH (ref 70–99)
Potassium: 3.6 mmol/L (ref 3.5–5.1)
Sodium: 140 mmol/L (ref 135–145)
Total Bilirubin: 0.4 mg/dL (ref 0.3–1.2)
Total Protein: 7.6 g/dL (ref 6.5–8.1)

## 2021-11-08 LAB — CBC WITH DIFFERENTIAL (CANCER CENTER ONLY)
Abs Immature Granulocytes: 0.03 10*3/uL (ref 0.00–0.07)
Basophils Absolute: 0 10*3/uL (ref 0.0–0.1)
Basophils Relative: 1 %
Eosinophils Absolute: 0.2 10*3/uL (ref 0.0–0.5)
Eosinophils Relative: 3 %
HCT: 35.7 % — ABNORMAL LOW (ref 36.0–46.0)
Hemoglobin: 12 g/dL (ref 12.0–15.0)
Immature Granulocytes: 1 %
Lymphocytes Relative: 21 %
Lymphs Abs: 1.3 10*3/uL (ref 0.7–4.0)
MCH: 27.8 pg (ref 26.0–34.0)
MCHC: 33.6 g/dL (ref 30.0–36.0)
MCV: 82.8 fL (ref 80.0–100.0)
Monocytes Absolute: 0.3 10*3/uL (ref 0.1–1.0)
Monocytes Relative: 5 %
Neutro Abs: 4.4 10*3/uL (ref 1.7–7.7)
Neutrophils Relative %: 69 %
Platelet Count: 282 10*3/uL (ref 150–400)
RBC: 4.31 MIL/uL (ref 3.87–5.11)
RDW: 13.8 % (ref 11.5–15.5)
WBC Count: 6.3 10*3/uL (ref 4.0–10.5)
nRBC: 0 % (ref 0.0–0.2)

## 2021-11-08 LAB — GENETIC SCREENING ORDER

## 2021-11-08 MED ORDER — ONDANSETRON HCL 8 MG PO TABS: 8.0000 mg | ORAL_TABLET | Freq: Two times a day (BID) | ORAL | 1 refills | Status: DC | PRN

## 2021-11-08 MED ORDER — PROCHLORPERAZINE MALEATE 10 MG PO TABS: 10.0000 mg | ORAL_TABLET | Freq: Four times a day (QID) | ORAL | 1 refills | Status: DC | PRN

## 2021-11-08 NOTE — Progress Notes (Signed)
START ON PATHWAY REGIMEN - Breast     Cycles 1 through 4: A cycle is every 14 days:     Doxorubicin      Cyclophosphamide      Pegfilgrastim-xxxx    Cycles 5 through 16: A cycle is every 7 days:     Paclitaxel   **Always confirm dose/schedule in your pharmacy ordering system**  Patient Characteristics: Preoperative or Nonsurgical Candidate (Clinical Staging), Neoadjuvant Therapy followed by Surgery, Invasive Disease, Chemotherapy, HER2 Negative/Unknown/Equivocal, ER Positive Therapeutic Status: Preoperative or Nonsurgical Candidate (Clinical Staging) AJCC M Category: cM0 AJCC Grade: G3 Breast Surgical Plan: Neoadjuvant Therapy followed by Surgery ER Status: Positive (+) AJCC 8 Stage Grouping: IIB HER2 Status: Negative (-) AJCC T Category: cT1c AJCC N Category: cN1 PR Status: Negative (-) Intent of Therapy: Curative Intent, Discussed with Patient 

## 2021-11-08 NOTE — Progress Notes (Addendum)
[place]   [phone] Fax:([phone]) [place] Note   Patient Care Team: Everardo Beals, NP as PCP - General Erroll Luna, MD as Consulting Physician (General Surgery) Truitt Merle, MD as Consulting Physician (Hematology) Gery Pray, MD as Consulting Physician (Radiation Oncology) [name], RN as Oncology Nurse Navigator [name], RN as Oncology Nurse Navigator  Date of Service:  11/08/2021   CHIEF COMPLAINTS/PURPOSE OF CONSULTATION:  Left Breast Cancer, ER weakly positive, PR-/HER2-  REFERRING PHYSICIAN:  [facility]   ASSESSMENT & PLAN:  Cassandra Brennan is a 66 y.o. postmenopausal female with a history of HTN  1. 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 B/l diagnostic MM and left [place] 10/19/21 showed: 2 cm mass at 10 o'clock; 5 mm indeterminate satellite mass at 12 o'clock; palpable left axilla mass corresponds with large lymph node. Biopsy 10/27/21 confirmed invasive ductal carcinoma with necrosis to both 10 o'clock and lymph node, grade 3. ER weakly positive (30%). ---We discussed her imaging findings and the biopsy results in great details. 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  My recommendation is for neoadjuvant chemotherapy with dose dense Adriamycin and Cytoxan every 2 weeks for 4 cycles, followed by weekly Taxol for 12 weeks. I discussed the indications and side effects with them today. --Chemotherapy consent: Side effects including but does not not limited to, fatigue, nausea, vomiting, diarrhea, hair loss, neuropathy, fluid retention, renal and kidney dysfunction, neutropenic fever, needed for blood transfusion, bleeding, cardiomyopathy, small risk of MDS and  leukemia, were discussed with patient in great detail. She agrees to proceed. -the goal of chemo is curative  -we will obtain a breast MRI, CT and bone scan, and a baseline echocardiogram. -she will likely proceed with lumpectomy, sentinel lymph node biopsy and targeted lymph node dissection, following chemotherapy. She was seen by Dr. Brantley Stage today. -She was also seen by radiation oncologist Dr. Sondra Come today. She will likely benefit from breast radiation if she undergo lumpectomy to decrease the risk of breast cancer.  -I discussed the benefit of antiestrogen therapy after radiation, given the weak ER positivity, the benefit is small to moderate. -We also discussed the breast cancer surveillance after her surgery. She will continue annual screening mammogram, self exam, and a routine office visit with lab and exam with [place]. -I encouraged her to have healthy diet and exercise regularly.   2.  Hypertension and anxiety -Continue medications. -Follow-up with primary care physician   PLAN:  -breast MRI -CT and bone scan for staging  -baseline echo -chemo education class next week -Port placement by Dr. Brantley Stage -Lab, follow-up and first cycle chemotherapy AC in about 2 weeks -genetic referral   Oncology History Overview Note   Cancer Staging  Malignant neoplasm of upper-inner quadrant of left breast in female, estrogen receptor positive ([place]) Staging form: Breast, AJCC 8th Edition - Clinical stage from 10/27/2021: Stage IIB (cT1c, cN1, cM0, G3, ER+, PR-, HER2-) - Signed by Truitt Merle, MD on 11/07/2021    Malignant neoplasm of upper-inner quadrant of left breast in female, estrogen receptor positive ([place])  10/19/2021 Mammogram   CLINICAL DATA:  Patient presents for palpable left axillary abnormality.   EXAM: DIGITAL DIAGNOSTIC BILATERAL MAMMOGRAM WITH TOMOSYNTHESIS AND CAD; ULTRASOUND LEFT  BREAST LIMITED  IMPRESSION: Suspicious left breast mass 10 o'clock position.   Adjacent  suspicious satellite nodule left breast 12 o'clock position.   Palpable mass left axilla corresponds with a large lymph node.   10/27/2021 Cancer Staging   Staging form: Breast, AJCC 8th Edition - Clinical stage from 10/27/2021: Stage IIB (cT1c, cN1, cM0, G3, ER+, PR-, HER2-) - Signed by Truitt Merle, MD on 11/07/2021 Stage prefix: Initial diagnosis Histologic grading system: 3 grade system    10/27/2021 Initial Biopsy   Diagnosis 1. Breast, left, needle core biopsy, 12 o'clock, ribbon - FIBROADENOMA - NO MALIGNANCY IDENTIFIED 2. Breast, left, needle core biopsy, 10 o'clock, coil - INVASIVE DUCTAL CARCINOMA WITH NECROSIS - DUCTAL CARCINOMA IN SITU - SEE COMMENT 3. Lymph node, needle/core biopsy, left axilla, tribell - INVASIVE DUCTAL CARCINOMA WITH NECROSIS - NO NODAL TISSUE IDENTIFIED - SEE COMMENT Microscopic Comment 2. and 2. Based on the biopsy, the carcinoma appears Nottingham grade 3 of 3 and measures 0.8 cm in greatest linear extent.  3. PROGNOSTIC INDICATORS Results: The tumor cells are NEGATIVE for Her2 (1+). Estrogen Receptor: 30%, POSITIVE, WEAK STAINING INTENSITY Progesterone Receptor: 0%, NEGATIVE Proliferation Marker Ki67: 60%   11/06/2021 Initial Diagnosis   Malignant neoplasm of upper-inner quadrant of left breast in female, estrogen receptor positive ([place])      HISTORY OF PRESENTING ILLNESS:  Cassandra Brennan 66 y.o. female is a here because of breast cancer. The patient was [facility] by [facility]. The patient presents to the clinic today accompanied by her sister.   She presented with a palpable left axilla abnormality. She initially noticed the lump in December 2022. She underwent bilateral diagnostic mammography and left breast ultrasonography on 10/19/21 showing: 2 cm left breast mass at 10 o'clock; adjacent 5 mm satellite lesion at 12 o'clock; palpable mass within left axilla corresponds with a markedly enlarged lymph node.  Biopsy on 10/27/21 showed:  invasive ductal carcinoma with necrosis, grade 3, and DCIS at 10 o'clock; IDC with necrosis, no nodal tissue identified in left axilla. Prognostic indicators significant for: estrogen receptor, 30% weakly positive and progesterone receptor, 0% negative. Proliferation marker Ki67 at 60%. HER2 negative.   Today the patient notes they felt/feeling prior/after... -palpable axillary lump feels smaller. -left shoulder discomfort  She has a PMHx of.... -HTN, high cholesterol -arthritis in right knee, does not require medication -prior right breast excision in 1980 -anxiety, on Xanax as needed  Socially... -she works part time as a Chief Executive Officer at [employer], which she notes involves a lot of lifting. -she lives at home with her fiance, who is a Administrator. -she has two children.  GYN HISTORY  Menarchal: 3 years old LMP: age 58 Contraceptive: HRT: none GP: 2, first at age 89   REVIEW OF SYSTEMS:    Constitutional: Denies fevers, chills or abnormal night sweats Eyes: Denies blurriness of vision, double vision or watery eyes Ears, nose, mouth, throat, and face: Denies mucositis or sore throat Respiratory: Denies cough, dyspnea or wheezes Cardiovascular: Denies palpitation, chest discomfort or lower extremity swelling Gastrointestinal:  Denies nausea, heartburn or change in bowel habits Skin: Denies abnormal skin rashes Lymphatics: Denies new lymphadenopathy or easy bruising Neurological:Denies numbness, tingling or new weaknesses Behavioral/Psych: Mood is stable, no new changes  All other systems were reviewed with the patient and are negative.   MEDICAL HISTORY:  Past Medical History:  Diagnosis Date   Anxiety    Arthritis    right knee    GERD (  gastroesophageal reflux disease)    HTN (hypertension)    Hyperlipidemia    Hypertension    Positive colorectal cancer screening using Cologuard test     SURGICAL HISTORY: Past Surgical History:  Procedure Laterality Date    BREAST CYST EXCISION Right    pt stated in 1980   BREAST SURGERY     other GI testing     unsure but had to drink a chalky drink    TONSILLECTOMY     TUBAL LIGATION      SOCIAL HISTORY: Social History   Socioeconomic History   Marital status: Married    Spouse name: Not on file   Number of children: 2   Years of education: Not on file   Highest education level: Not on file  Occupational History   Not on file  Tobacco Use   Smoking status: Never   Smokeless tobacco: Never  Substance and Sexual Activity   Alcohol use: No   Drug use: No   Sexual activity: Not on file  Other Topics Concern   Not on file  Social History Narrative   Not on file   Social Determinants of Health   Financial Resource Strain: Not on file  Food Insecurity: No Food Insecurity   Worried About Running Out of Food in the Last Year: Never true   Ran Out of Food in the Last Year: Never true  Transportation Needs: No Transportation Needs   Lack of Transportation (Medical): No   Lack of Transportation (Non-Medical): No  Physical Activity: Not on file  Stress: Not on file  Social Connections: Not on file  Intimate Partner Violence: Not on file    FAMILY HISTORY: Family History  Problem Relation Age of Onset   Colon polyps Sister    Colon polyps Sister    Colon polyps Sister    Colon cancer Maternal Uncle        dx. 50s   Throat cancer Maternal Uncle    Colon cancer Cousin        dx. >50   Gastric cancer Cousin        dx. >50   Breast cancer Cousin        dx. 30s   Kidney cancer Nephew    Esophageal cancer Neg Hx    Rectal cancer Neg Hx    Stomach cancer Neg Hx     ALLERGIES:  is allergic to prednisone, other, robitussin (alcohol free) [guaifenesin], and sulfa antibiotics.  MEDICATIONS:  Current Outpatient Medications  Medication Sig Dispense Refill   ALPRAZolam (XANAX) 0.5 MG tablet Take 0.5 mg by mouth at bedtime as needed for anxiety.     metoprolol succinate (TOPROL-XL) 100  MG 24 hr tablet Take 1 tablet (100 mg total) by mouth daily. Take with or immediately following a meal. 30 tablet 0   pravastatin (PRAVACHOL) 20 MG tablet Take 1 tablet (20 mg total) by mouth daily. 30 tablet 0   fluticasone (FLONASE) 50 MCG/ACT nasal spray fluticasone propionate 50 mcg/actuation nasal spray,suspension (Patient not taking: No sig reported)     VITAMIN D PO Take by mouth. Take infrequently (Patient not taking: Reported on 09/28/2020)     No current facility-administered medications for this visit.    PHYSICAL EXAMINATION: ECOG PERFORMANCE STATUS: 0 - Asymptomatic  Vitals:   11/08/21 0815  BP: (!) 158/88  Pulse: (!) 102  Resp: 18  Temp: (!) 97.5 F (36.4 C)  SpO2: 97%   Filed Weights   11/08/21 0815  Weight: 183 lb (83 kg)    GENERAL:alert, no distress and comfortable SKIN: skin color, texture, turgor are normal, no rashes or significant lesions EYES: normal, Conjunctiva are pink and non-injected, sclera clear  NECK: supple, thyroid normal size, non-tender, without nodularity LYMPH:  no palpable lymphadenopathy in the cervical, (+) 4 x 3 cm left axillary mass  LUNGS: clear to auscultation and percussion with normal breathing effort HEART: regular rate & rhythm and no murmurs and no lower extremity edema ABDOMEN:abdomen soft, non-tender and normal bowel sounds Musculoskeletal:no cyanosis of digits and no clubbing  NEURO: alert & oriented x 3 with fluent speech, no focal motor/sensory deficits BREAST: 2 x 2 cm mass at 10-11 o'clock.   LABORATORY DATA:  I have reviewed the data as listed CBC Latest Ref Rng & Units 11/08/2021 03/15/2020 09/29/2015  WBC 4.0 - 10.5 K/uL 6.3 7.9 12.7(H)  Hemoglobin 12.0 - 15.0 g/dL 12.0 12.2 12.9  Hematocrit 36.0 - 46.0 % 35.7(L) 36.8 38.0  Platelets 150 - 400 K/uL 282 275 293    CMP Latest Ref Rng & Units 11/08/2021 03/15/2020 09/29/2015  Glucose 70 - 99 mg/dL 134(H) 105 148(H)  BUN 8 - 23 mg/dL $Remove'15 12 10  'iKfjgJx$ Creatinine 0.44 - 1.00  mg/dL 0.87 1.06(H) 0.88  Sodium 135 - 145 mmol/L 140 141 138  Potassium 3.5 - 5.1 mmol/L 3.6 3.9 3.4(L)  Chloride 98 - 111 mmol/L 106 107 103  CO2 22 - 32 mmol/L $RemoveB'26 25 26  'UdxJPuaV$ Calcium 8.9 - 10.3 mg/dL 9.6 9.5 9.4  Total Protein 6.5 - 8.1 g/dL 7.6 7.1 -  Total Bilirubin 0.3 - 1.2 mg/dL 0.4 0.4 -  Alkaline Phos 38 - 126 U/L 100 - -  AST 15 - 41 U/L 14(L) 20 -  ALT 0 - 44 U/L 25 25 -     RADIOGRAPHIC STUDIES: I have personally reviewed the radiological images as listed and agreed with the findings in the report. US BREAST LTD UNI LEFT INC AXILLA  Addendum Date: 10/27/2021   ADDENDUM REPORT: 10/27/2021 10:50 ADDENDUM: Addendum for correction of impression and recommendation. In the original impression, the satellite nodule was incorrectly labeled at the 10 o'clock position. It is at the 12 o'clock position. Recommendation: Ultrasound-guided core needle biopsy left breast mass 10 o'clock position. Ultrasound-guided core needle biopsy adjacent suspicious satellite nodule left breast 12 o'clock position. Ultrasound-guided core needle biopsy of the thickened left axillary lymph node. Electronically Signed   By: Lovey Newcomer M.D.   On: 10/27/2021 10:50   Result Date: 10/27/2021 CLINICAL DATA:  Patient presents for palpable left axillary abnormality. EXAM: DIGITAL DIAGNOSTIC BILATERAL MAMMOGRAM WITH TOMOSYNTHESIS AND CAD; ULTRASOUND LEFT BREAST LIMITED TECHNIQUE: Bilateral digital diagnostic mammography and breast tomosynthesis was performed. The images were evaluated with computer-aided detection.; Targeted ultrasound examination of the left breast was performed. COMPARISON:  None. ACR Breast Density Category c: The breast tissue is heterogeneously dense, which may obscure small masses. FINDINGS: Underlying the palpable marker within the left axilla is a large mass favored to represent an enlarged lymph node. Within the upper inner left breast posterior depth there is a persistent irregular mass. There is a  smaller oval circumscribed mass within the left breast just lateral to the dominant mass. No additional concerning findings within either breast. Targeted ultrasound is performed, showing a 1.6 x 2.0 x 1.0 cm irregular hypoechoic mass left breast 10 o'clock position 4 cm from nipple. There is an adjacent 5 x 4 x 5 mm irregular hypoechoic mass left breast  12 o'clock position 4 cm from the nipple. Additionally, in this area is a 4 x 5 x 3 mm complicated cyst left breast 11 o'clock position 4 cm from nipple. Palpable mass within the left axilla corresponds with a markedly enlarged lymph node. IMPRESSION: Suspicious left breast mass 10 o'clock position. Adjacent suspicious satellite nodule left breast 10 o'clock position. Palpable mass left axilla corresponds with a large lymph node. RECOMMENDATION: Ultrasound-guided core needle biopsy left breast mass 10 o'clock position. Ultrasound-guided core needle biopsy of the adjacent suspicious satellite nodule left breast 10 o'clock position. Ultrasound-guided core needle biopsy of the thickened left axillary lymph node. I have discussed the findings and recommendations with the patient. If applicable, a reminder letter will be sent to the patient regarding the next appointment. BI-RADS CATEGORY  5: Highly suggestive of malignancy. Electronically Signed: By: Lovey Newcomer M.D. On: 10/19/2021 16:23  MM DIAG BREAST TOMO BILATERAL  Addendum Date: 10/27/2021   ADDENDUM REPORT: 10/27/2021 10:50 ADDENDUM: Addendum for correction of impression and recommendation. In the original impression, the satellite nodule was incorrectly labeled at the 10 o'clock position. It is at the 12 o'clock position. Recommendation: Ultrasound-guided core needle biopsy left breast mass 10 o'clock position. Ultrasound-guided core needle biopsy adjacent suspicious satellite nodule left breast 12 o'clock position. Ultrasound-guided core needle biopsy of the thickened left axillary lymph node. Electronically  Signed   By: Lovey Newcomer M.D.   On: 10/27/2021 10:50   Result Date: 10/27/2021 CLINICAL DATA:  Patient presents for palpable left axillary abnormality. EXAM: DIGITAL DIAGNOSTIC BILATERAL MAMMOGRAM WITH TOMOSYNTHESIS AND CAD; ULTRASOUND LEFT BREAST LIMITED TECHNIQUE: Bilateral digital diagnostic mammography and breast tomosynthesis was performed. The images were evaluated with computer-aided detection.; Targeted ultrasound examination of the left breast was performed. COMPARISON:  None. ACR Breast Density Category c: The breast tissue is heterogeneously dense, which may obscure small masses. FINDINGS: Underlying the palpable marker within the left axilla is a large mass favored to represent an enlarged lymph node. Within the upper inner left breast posterior depth there is a persistent irregular mass. There is a smaller oval circumscribed mass within the left breast just lateral to the dominant mass. No additional concerning findings within either breast. Targeted ultrasound is performed, showing a 1.6 x 2.0 x 1.0 cm irregular hypoechoic mass left breast 10 o'clock position 4 cm from nipple. There is an adjacent 5 x 4 x 5 mm irregular hypoechoic mass left breast 12 o'clock position 4 cm from the nipple. Additionally, in this area is a 4 x 5 x 3 mm complicated cyst left breast 11 o'clock position 4 cm from nipple. Palpable mass within the left axilla corresponds with a markedly enlarged lymph node. IMPRESSION: Suspicious left breast mass 10 o'clock position. Adjacent suspicious satellite nodule left breast 10 o'clock position. Palpable mass left axilla corresponds with a large lymph node. RECOMMENDATION: Ultrasound-guided core needle biopsy left breast mass 10 o'clock position. Ultrasound-guided core needle biopsy of the adjacent suspicious satellite nodule left breast 10 o'clock position. Ultrasound-guided core needle biopsy of the thickened left axillary lymph node. I have discussed the findings and recommendations  with the patient. If applicable, a reminder letter will be sent to the patient regarding the next appointment. BI-RADS CATEGORY  5: Highly suggestive of malignancy. Electronically Signed: By: Lovey Newcomer M.D. On: 10/19/2021 16:23  [place] AXILLARY NODE CORE BIOPSY LEFT  Addendum Date: 10/30/2021   ADDENDUM REPORT: 10/30/2021 16:45 ADDENDUM: Pathology revealed FIBROADENOMA of the LEFT breast, 12 o'clock, (ribbon clip). This was  found to be discordant by Dr. Lovey Newcomer, with excision recommended. 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 This was found to be concordant by Dr. Lovey Newcomer. Pathology revealed INVASIVE DUCTAL CARCINOMA WITH NECROSIS, NO NODAL TISSUE IDENTIFIED of the LEFT axilla, (tribell clip). This was found to be concordant by Dr. Lovey Newcomer. Pathology results were discussed with the patient by telephone. The patient reported doing well after the biopsies with tenderness at the sites. Post biopsy instructions and care were reviewed and questions were answered. The patient was encouraged to call [facility] for any additional concerns. My direct phone number was provided. The patient was referred to [facility] on November 08, 2021. Pathology results reported by Terie Purser, RN on 10/30/2021. Electronically Signed   By: Lovey Newcomer M.D.   On: 10/30/2021 16:45   Result Date: 10/30/2021 CLINICAL DATA:  Patient with indeterminate left breast masses and cortically thickened left axillary lymph node. EXAM: ULTRASOUND GUIDED LEFT BREAST CORE NEEDLE BIOPSY COMPARISON:  Previous exam(s). PROCEDURE: I met with the patient and we discussed the procedure of ultrasound-guided biopsy, including benefits and alternatives. We discussed the high likelihood of a successful procedure. We discussed the risks of the procedure,  including infection, bleeding, tissue injury, clip migration, and inadequate sampling. Informed written consent was given. The usual time-out protocol was performed immediately prior to the procedure. Site 1: Left breast mass 12 o'clock position Lesion quadrant: Upper inner quadrant Using sterile technique and 1% Lidocaine as local anesthetic, under direct ultrasound visualization, a 14 gauge spring-loaded device was used to perform biopsy of left breast mass 12 o'clock position using a medial approach. At the conclusion of the procedure ribbon shaped tissue marker clip was deployed into the biopsy cavity. Follow up 2 view mammogram was performed and dictated separately. Site 2: Left breast mass 10 o'clock position Lesion quadrant: Upper inner quadrant Using sterile technique and 1% Lidocaine as local anesthetic, under direct ultrasound visualization, a 14 gauge spring-loaded device was used to perform biopsy of left breast mass 10 o'clock position using a medial approach. At the conclusion of the procedure coil shaped tissue marker clip was deployed into the biopsy cavity. Follow up 2 view mammogram was performed and dictated separately. Site 3: Left axillary lymph node Lesion quadrant: Upper outer quadrant Using sterile technique and 1% Lidocaine as local anesthetic, under direct ultrasound visualization, a 14 gauge spring-loaded device was used to perform biopsy of left axillary lymph node using a lateral approach. At the conclusion of the procedure tri bell tissue marker clip was deployed into the biopsy cavity. Follow up 2 view mammogram was performed and dictated separately. IMPRESSION: Ultrasound guided biopsy of left breast masses and left axillary lymph node as above. No apparent complications. Electronically Signed: By: Lovey Newcomer M.D. On: 10/27/2021 10:47  MM CLIP PLACEMENT LEFT  Result Date: 10/27/2021 CLINICAL DATA:  Patient status post ultrasound-guided core needle biopsy left breast masses and  left axillary lymph node. EXAM: 3D DIAGNOSTIC LEFT MAMMOGRAM POST ULTRASOUND BIOPSY COMPARISON:  Previous exam(s). FINDINGS: 3D Mammographic images were obtained following ultrasound guided biopsy of left breast masses and left axillary lymph. Site 1: Left breast mass 12 o'clock position: Ribbon shaped clip: In appropriate position. Site 2: Left breast mass 10 o'clock position: Coil shaped clip: In appropriate position. Site 3: Left axillary node: Tri  bell clip: In appropriate position. IMPRESSION: Appropriate positioning of the type shaped biopsy marking clip at the site of biopsy in the location. Final Assessment: Post Procedure Mammograms for Marker Placement Electronically Signed   By: Lovey Newcomer M.D.   On: 10/27/2021 10:49  [place] LT BREAST BX W LOC DEV 1ST LESION IMG BX SPEC US GUIDE  Addendum Date: 10/30/2021   ADDENDUM REPORT: 10/30/2021 16:45 ADDENDUM: Pathology revealed FIBROADENOMA of the LEFT breast, 12 o'clock, (ribbon clip). This was found to be discordant by Dr. Lovey Newcomer, with excision recommended. 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 This was found to be concordant by Dr. Lovey Newcomer. Pathology revealed INVASIVE DUCTAL CARCINOMA WITH NECROSIS, NO NODAL TISSUE IDENTIFIED of the LEFT axilla, (tribell clip). This was found to be concordant by Dr. Lovey Newcomer. Pathology results were discussed with the patient by telephone. The patient reported doing well after the biopsies with tenderness at the sites. Post biopsy instructions and care were reviewed and questions were answered. The patient was encouraged to call [facility] for any additional concerns. My direct phone number was provided. The patient was referred to [facility] on November 08, 2021. Pathology results reported by Terie Purser, RN on  10/30/2021. Electronically Signed   By: Lovey Newcomer M.D.   On: 10/30/2021 16:45   Result Date: 10/30/2021 CLINICAL DATA:  Patient with indeterminate left breast masses and cortically thickened left axillary lymph node. EXAM: ULTRASOUND GUIDED LEFT BREAST CORE NEEDLE BIOPSY COMPARISON:  Previous exam(s). PROCEDURE: I met with the patient and we discussed the procedure of ultrasound-guided biopsy, including benefits and alternatives. We discussed the high likelihood of a successful procedure. We discussed the risks of the procedure, including infection, bleeding, tissue injury, clip migration, and inadequate sampling. Informed written consent was given. The usual time-out protocol was performed immediately prior to the procedure. Site 1: Left breast mass 12 o'clock position Lesion quadrant: Upper inner quadrant Using sterile technique and 1% Lidocaine as local anesthetic, under direct ultrasound visualization, a 14 gauge spring-loaded device was used to perform biopsy of left breast mass 12 o'clock position using a medial approach. At the conclusion of the procedure ribbon shaped tissue marker clip was deployed into the biopsy cavity. Follow up 2 view mammogram was performed and dictated separately. Site 2: Left breast mass 10 o'clock position Lesion quadrant: Upper inner quadrant Using sterile technique and 1% Lidocaine as local anesthetic, under direct ultrasound visualization, a 14 gauge spring-loaded device was used to perform biopsy of left breast mass 10 o'clock position using a medial approach. At the conclusion of the procedure coil shaped tissue marker clip was deployed into the biopsy cavity. Follow up 2 view mammogram was performed and dictated separately. Site 3: Left axillary lymph node Lesion quadrant: Upper outer quadrant Using sterile technique and 1% Lidocaine as local anesthetic, under direct ultrasound visualization, a 14 gauge spring-loaded device was used to perform biopsy of left axillary lymph  node using a lateral approach. At the conclusion of the procedure tri bell tissue marker clip was deployed into the biopsy cavity. Follow up 2 view mammogram was performed and dictated separately. IMPRESSION: Ultrasound guided biopsy of left breast masses and left axillary lymph node as above. No apparent complications. Electronically Signed: By: Lovey Newcomer M.D. On: 10/27/2021 10:47  [place] LT BREAST BX W LOC  DEV EA ADD LESION IMG BX SPEC US GUIDE  Addendum Date: 10/30/2021   ADDENDUM REPORT: 10/30/2021 16:45 ADDENDUM: Pathology revealed FIBROADENOMA of the LEFT breast, 12 o'clock, (ribbon clip). This was found to be discordant by Dr. Lovey Newcomer, with excision recommended. 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 This was found to be concordant by Dr. Lovey Newcomer. Pathology revealed INVASIVE DUCTAL CARCINOMA WITH NECROSIS, NO NODAL TISSUE IDENTIFIED of the LEFT axilla, (tribell clip). This was found to be concordant by Dr. Lovey Newcomer. Pathology results were discussed with the patient by telephone. The patient reported doing well after the biopsies with tenderness at the sites. Post biopsy instructions and care were reviewed and questions were answered. The patient was encouraged to call [facility] for any additional concerns. My direct phone number was provided. The patient was referred to [facility] on November 08, 2021. Pathology results reported by Terie Purser, RN on 10/30/2021. Electronically Signed   By: Lovey Newcomer M.D.   On: 10/30/2021 16:45   Result Date: 10/30/2021 CLINICAL DATA:  Patient with indeterminate left breast masses and cortically thickened left axillary lymph node. EXAM: ULTRASOUND GUIDED LEFT BREAST CORE NEEDLE BIOPSY COMPARISON:  Previous exam(s). PROCEDURE: I met with the patient and we  discussed the procedure of ultrasound-guided biopsy, including benefits and alternatives. We discussed the high likelihood of a successful procedure. We discussed the risks of the procedure, including infection, bleeding, tissue injury, clip migration, and inadequate sampling. Informed written consent was given. The usual time-out protocol was performed immediately prior to the procedure. Site 1: Left breast mass 12 o'clock position Lesion quadrant: Upper inner quadrant Using sterile technique and 1% Lidocaine as local anesthetic, under direct ultrasound visualization, a 14 gauge spring-loaded device was used to perform biopsy of left breast mass 12 o'clock position using a medial approach. At the conclusion of the procedure ribbon shaped tissue marker clip was deployed into the biopsy cavity. Follow up 2 view mammogram was performed and dictated separately. Site 2: Left breast mass 10 o'clock position Lesion quadrant: Upper inner quadrant Using sterile technique and 1% Lidocaine as local anesthetic, under direct ultrasound visualization, a 14 gauge spring-loaded device was used to perform biopsy of left breast mass 10 o'clock position using a medial approach. At the conclusion of the procedure coil shaped tissue marker clip was deployed into the biopsy cavity. Follow up 2 view mammogram was performed and dictated separately. Site 3: Left axillary lymph node Lesion quadrant: Upper outer quadrant Using sterile technique and 1% Lidocaine as local anesthetic, under direct ultrasound visualization, a 14 gauge spring-loaded device was used to perform biopsy of left axillary lymph node using a lateral approach. At the conclusion of the procedure tri bell tissue marker clip was deployed into the biopsy cavity. Follow up 2 view mammogram was performed and dictated separately. IMPRESSION: Ultrasound guided biopsy of left breast masses and left axillary lymph node as above. No apparent complications. Electronically Signed:  By: Lovey Newcomer M.D. On: 10/27/2021 10:47    No orders of the defined types were placed in this encounter.   All questions were answered. The patient knows to call the clinic with any problems, questions or concerns. The total time spent in the appointment was 60 minutes.     Truitt Merle, MD 11/08/2021   I, Wilburn Mylar, am acting  as scribe for Truitt Merle, MD.   I have reviewed the above documentation for accuracy and completeness, and I agree with the above.

## 2021-11-08 NOTE — Progress Notes (Signed)
[place] Work  Initial Assessment   Cassandra Brennan is a 66 y.o. year old female accompanied by sister. Clinical Social Work was referred by [facility] for assessment of psychosocial needs.   SDOH (Social Determinants of Health) assessments performed: Yes SDOH Interventions    Flowsheet Row Most Recent Value  SDOH Interventions   Food Insecurity Interventions Intervention Not Indicated  Housing Interventions Intervention Not Indicated  Transportation Interventions Intervention Not Indicated       Distress Screen completed: Yes ONCBCN DISTRESS SCREENING 11/08/2021  Screening Type Initial Screening  Distress experienced in past week (1-10) 5  Emotional problem type Nervousness/Anxiety      Family/Social Information:  Housing Arrangement: patient lives alone. Sister, son, and daughter live nearby Family members/support persons in your life? Family, [place], and friends Transportation concerns: no  Employment: Working part time as a Chief Executive Officer at [employer]. Income source: Employment and Paediatric nurse concerns: No Type of concern: None Food access concerns: no Religious or spiritual practice: yes, reading the Bible and her faith are very important to her. Also involved in [place] Currently in place:  n/a  Coping/ Adjustment to diagnosis: Patient understands treatment plan and what happens next? yes, a little overwhelmed with information but understands the plan Concerns about diagnosis and/or treatment: Overwhelmed by information Patient reported stressors: Anxiety Patient enjoys music and reading Current coping skills/ strengths: Capable of independent living , Religious Affiliation , and Supportive family/friends     SUMMARY: Current SDOH Barriers:  No significant SDOH concerns at this time  Clinical Social Work Clinical Goal(s):  No clinical SW goals at this time  Interventions: Discussed the importance of support  during treatment Informed patient of the support team roles and support services at [facility] Provided CSW contact information and encouraged patient to call with any questions or concerns Referred patient to Producer, television/film/video Signed patient up for support group   Follow Up Plan: Patient will connect with [facility] guide once they reach out to her Patient verbalizes understanding of plan: Yes    Christeen Douglas LCSW

## 2021-11-08 NOTE — Therapy (Signed)
OUTPATIENT PHYSICAL THERAPY BREAST CANCER BASELINE EVALUATION   Patient Name: Cassandra Brennan MRN: [id number] DOB:09-Jun-1956, 66 y.o., female Today's Date: 11/08/2021   PT End of Session - 11/08/21 1020     Visit Number 1    Number of Visits 2    Date for PT Re-Evaluation 05/08/22    PT Start Time 0917    PT Stop Time 0953    PT Time Calculation (min) 36 min    Activity Tolerance Patient tolerated treatment well    Behavior During Therapy [facility] for tasks assessed/performed             Past Medical History:  Diagnosis Date   Anxiety    Arthritis    right knee    GERD (gastroesophageal reflux disease)    Hyperlipidemia    Hypertension    Positive colorectal cancer screening using Cologuard test    Past Surgical History:  Procedure Laterality Date   BREAST CYST EXCISION Right    pt stated in [place]     other GI testing     unsure but had to drink a chalky drink    TONSILLECTOMY     TUBAL LIGATION     Patient Active Problem List   Diagnosis Date Noted   Malignant neoplasm of upper-inner quadrant of left breast in female, estrogen receptor positive ([place]) 11/06/2021   Hypertension 03/15/2020    PCP: Everardo Beals, NP  REFERRING PROVIDER: Erroll Luna, MD  REFERRING DIAG: Left breast cancer  THERAPY DIAG:  Malignant neoplasm of upper-inner quadrant of left breast in female, estrogen receptor positive ([place])  Abnormal posture  ONSET DATE: 10/19/2021  SUBJECTIVE                                                                                                                                                                                           SUBJECTIVE STATEMENT: Patient reports she is here today to be seen by her medical team for her newly diagnosed left breast cancer.   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 It measures 2 cm and is located in the upper  inner quadrant. It is weakly ER positive, PR negative and HER2 negative with a Ki67 of 60%.   PATIENT GOALS   reduce lymphedema risk and learn post op HEP.   PAIN:  Are you having pain? Yes NPRS scale: 4/10 Pain location: right knee Pain orientation: Right  PAIN TYPE: stiffness Pain description: intermittent  Aggravating factors: walking Relieving factors: resting  PRECAUTIONS: Active CA  WEIGHT BEARING RESTRICTIONS No  FALLS:  Has patient fallen in last 6 months? No, Number of falls: 0  LIVING ENVIRONMENT: Patient lives with:fiancee Lives in: House/apartment Has following equipment at home: None  OCCUPATION: Part-time CNA at [employer]  LEISURE: She walks on a treadmill 2x/week for 30 min  PRIOR LEVEL OF FUNCTION: Independent   OBJECTIVE  COGNITION:  Overall cognitive status: Within functional limits for tasks assessed    POSTURE:  Forward head and rounded shoulders posture  UPPER EXTREMITY AROM/PROM:  A/PROM Right 11/08/2021 Left 11/08/2021  Shoulder extension 51 155  Shoulder flexion 155 141  Shoulder abduction 150 135  Shoulder internal rotation 55 66  Shoulder external rotation 84 88    (Blank rows = not tested)    CERVICAL AROM: All within normal limits  UPPER EXTREMITY STRENGTH: WNL   LYMPHEDEMA ASSESSMENTS:   LANDMARK RIGHT 11/08/2021 LEFT 11/08/2021  10 cm proximal to olecranon process 32 32.7  Olecranon process 27 27.8  10 cm proximal to ulnar styloid process 24.9 25.2  Just proximal to ulnar styloid process 17.8 17.5  Across hand at thumb web space 19.8 21.3  At base of 2nd digit 6.6 7.2  (Blank rows = not tested)   L-DEX LYMPHEDEMA SCREENING:  The patient was assessed using the L-Dex machine today to produce a lymphedema index baseline score. The patient will be reassessed on a regular basis (typically every 3 months) to obtain new L-Dex scores. If the score is > 6.5 points away from his/her baseline score indicating onset of  subclinical lymphedema, it will be recommended to wear a compression garment for 4 weeks, 12 hours per day and then be reassessed. If the score continues to be > 6.5 points from baseline at reassessment, we will initiate lymphedema treatment. Assessing in this manner has a 95% rate of preventing clinically significant lymphedema.  L-DEX LYMPHEDEMA SCREENING Measurement Type: Unilateral L-DEX MEASUREMENT EXTREMITY: Upper Extremity POSITION : Standing DOMINANT SIDE: Right At Risk Side: Left BASELINE SCORE (UNILATERAL): 9.6   QUICK DASH SURVEY:   Katina Dung - 11/08/21 0001     Open a tight or new jar No difficulty    Do heavy household chores (wash walls, wash floors) No difficulty    Carry a shopping bag or briefcase No difficulty    Wash your back No difficulty    Use a knife to cut food No difficulty    Recreational activities in which you take some force or impact through your arm, shoulder, or hand (golf, hammering, tennis) No difficulty    During the past week, to what extent has your arm, shoulder or hand problem interfered with your normal social activities with family, friends, neighbors, or groups? Not at all    During the past week, to what extent has your arm, shoulder or hand problem limited your work or other regular daily activities Not at all    Arm, shoulder, or hand pain. None    Tingling (pins and needles) in your arm, shoulder, or hand None    Difficulty Sleeping No difficulty    DASH Score 0 %              PATIENT EDUCATION:  Education details: Lymphedema risk reduction and post op shoulder/posture HEP Person educated: Patient Education method: Explanation, Demonstration, Handout Education comprehension: Patient verbalized understanding and returned demonstration   HOME EXERCISE PROGRAM: Patient was instructed today in a home exercise program today for post op shoulder range of motion. These included active assist shoulder flexion in sitting, scapular  retraction, wall walking with shoulder abduction, and hands behind head external rotation.  She was encouraged to do these twice a day, holding 3 seconds and repeating 5 times when permitted by her physician.   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 It measures 2 cm and is located in the upper inner quadrant. It is weakly ER positive, PR negative and HER2 negative with a Ki67 of 60%. She has a biopsied positive axillary lymph node. Her multidisciplinary medical team met prior to her assessments to determine a recommended treatment plan. She is planning to have neoadjuvant chemotherapy followed by a left lumpectomy and targeted node dissection, radiation and anti-estrogen therapy. She will benefit from a post op PT reassessment to determine needs and from L-Dex screens every 3 months for 2 years to detect subclinical lymphedema.  Pt will benefit from skilled therapeutic intervention to improve on the following deficits: Decreased knowledge of precautions, impaired UE functional use, pain, decreased ROM, postural dysfunction.   PT treatment/interventions: ADL/self-care home management, pt/family education, therapeutic exercise  REHAB POTENTIAL: Excellent  CLINICAL DECISION MAKING: Stable/uncomplicated  EVALUATION COMPLEXITY: Low   GOALS: Goals reviewed with patient? YES  LONG TERM GOALS: (STG=LTG)   Name Target Date Goal status  1 Pt will be able to verbalize understanding of pertinent lymphedema risk reduction practices relevant to her dx specifically related to skin care.  Baseline:  No knowledge 11/08/2021 Achieved at eval  2 Pt will be able to return demo and/or verbalize understanding of the post op HEP related to regaining shoulder ROM. Baseline:  No knowledge 11/08/2021 Achieved at eval  3 Pt will be able to verbalize understanding of the importance of attending the post op After Breast CA  Class for further lymphedema risk reduction education and therapeutic exercise.  Baseline:  No knowledge 11/08/2021 Achieved at eval  4 Pt will demo she has regained full shoulder ROM and function post operatively compared to baselines.  Baseline: See objective measurements taken today. 05/08/2022      PLAN: PT FREQUENCY/DURATION: EVAL and 1 follow up appointment.   PLAN FOR NEXT SESSION: will reassess 3-4 weeks post op to determine needs.   Patient will follow up at outpatient cancer rehab 3-4 weeks following surgery.  If the patient requires physical therapy at that time, a specific plan will be dictated and sent to the referring physician for approval. The patient was educated today on appropriate basic range of motion exercises to begin post operatively and the importance of attending the After Breast Cancer class following surgery.  Patient was educated today on lymphedema risk reduction practices as it pertains to recommendations that will benefit the patient immediately following surgery.  She verbalized good understanding.    Physical Therapy Information for After Breast Cancer Surgery/Treatment:  Lymphedema is a swelling condition that you may be at risk for in your arm if you have lymph nodes removed from the armpit area.  After a sentinel node biopsy, the risk is approximately 5-9% and is higher after an axillary node dissection.  There is treatment available for this condition and it is not life-threatening.  Contact your physician or physical therapist with concerns. You may begin the 4 shoulder/posture exercises (see additional sheet) when permitted by your physician (typically a week after surgery).  If you have drains, you may need to wait until those are removed before beginning range of motion exercises.  A general recommendation is to not lift your  arms above shoulder height until drains are removed.  These exercises should be done to your tolerance and gently.  This is not a "no  pain/no gain" type of recovery so listen to your body and stretch into the range of motion that you can tolerate, stopping if you have pain.  If you are having immediate reconstruction, ask your plastic surgeon about doing exercises as he or she may want you to wait. We encourage you to attend the free one time ABC (After Breast Cancer) class offered by [facility].  You will learn information related to lymphedema risk, prevention and treatment and additional exercises to regain mobility following surgery.  You can call [phone] for more information.  This is offered the 1st and 3rd Monday of each month.  You only attend the class one time. While undergoing any medical procedure or treatment, try to avoid blood pressure being taken or needle sticks from occurring on the arm on the side of cancer.   This recommendation begins after surgery and continues for the rest of your life.  This may help reduce your risk of getting lymphedema (swelling in your arm). An excellent resource for those seeking information on lymphedema is the National Lymphedema Network's web site. It can be accessed at [place].org If you notice swelling in your hand, arm or breast at any time following surgery (even if it is many years from now), please contact your doctor or physical therapist to discuss this.  Lymphedema can be treated at any time but it is easier for you if it is treated early on.  If you feel like your shoulder motion is not returning to normal in a reasonable amount of time, please contact your surgeon or physical therapist.  [facility]. [place], [place], [place] [phone]; [address], [place], [place] [address] ABC CLASS After Breast Cancer Class  After Breast Cancer Class is a specially designed exercise class to assist you in a safe recover after having breast cancer surgery.  In this class you will learn how to get back to full function whether your drains were just removed or if you  had surgery a month ago.  This one-time class is held the 1st and 3rd Monday of every month from 11:00 a.m. until 12:00 noon at the [facility] located at [place], [place] [address]  This class is FREE and space is limited. For more information or to register for the next available class, call [phone].  Class Goals  Understand specific stretches to improve the flexibility of you chest and shoulder. Learn ways to safely strengthen your upper body and improve your posture. Understand the warning signs of infection and why you may be at risk for an arm infection. Learn about Lymphedema and prevention.  ** You do not attend this class until after surgery.  Drains must be removed to participate  Patient was instructed today in a home exercise program today for post op shoulder range of motion. These included active assist shoulder flexion in sitting, scapular retraction, wall walking with shoulder abduction, and hands behind head external rotation.  She was encouraged to do these twice a day, holding 3 seconds and repeating 5 times when permitted by her physician.  Annia Friendly, [place] 11/08/21 10:56 AM

## 2021-11-09 ENCOUNTER — Telehealth: Payer: Self-pay | Admitting: *Deleted

## 2021-11-09 ENCOUNTER — Encounter: Payer: Self-pay | Admitting: *Deleted

## 2021-11-09 ENCOUNTER — Encounter: Payer: Self-pay | Admitting: Hematology

## 2021-11-09 ENCOUNTER — Other Ambulatory Visit: Payer: Self-pay | Admitting: Hematology

## 2021-11-09 NOTE — Telephone Encounter (Signed)
Spoke to pt concerning [place] from 1.18.23. Denies questions or concerns regarding dx or treatment care plan. Discussed difference b/t anti-nausea medications prescribed by Dr. Burr Medico. Encourage pt to call with further questions or needs. Received verbal understanding.

## 2021-11-09 NOTE — Progress Notes (Signed)
REFERRING PROVIDER: Truitt Merle, MD [place], [place] [address]  PRIMARY PROVIDER:  [place] Beals, NP  PRIMARY REASON FOR VISIT:  1. Malignant neoplasm of upper-inner quadrant of left breast in female, estrogen receptor positive ([place])   2. Family history of colon cancer     HISTORY OF PRESENT ILLNESS:   Ms. Risdon, a 66 y.o. female, was seen for a [facility] cancer genetics consultation during the breast multidisciplinary clinic at the request of Dr. Burr Medico due to a personal and family history of cancer.  Ms. Dao presents to clinic today to discuss the possibility of a hereditary predisposition to cancer, to discuss genetic testing, and to further clarify her future cancer risks, as well as potential cancer risks for family members.   In January 2023, at the age of 59, Ms. Lorensen was diagnosed with invasive ductal carcinoma of the left breast. The cancer is considered to be functionally triple negative (ER 30%+, PR-, HER2-).   CANCER HISTORY:  Oncology History Overview Note   Cancer Staging  Malignant neoplasm of upper-inner quadrant of left breast in female, estrogen receptor positive ([place]) Staging form: Breast, AJCC 8th Edition - Clinical stage from 10/27/2021: Stage IIB (cT1c, cN1, cM0, G3, ER+, PR-, HER2-) - Signed by Truitt Merle, MD on 11/07/2021    Malignant neoplasm of upper-inner quadrant of left breast in female, estrogen receptor positive ([place])  10/19/2021 Mammogram   CLINICAL DATA:  Patient presents for palpable left axillary abnormality.   EXAM: DIGITAL DIAGNOSTIC BILATERAL MAMMOGRAM WITH TOMOSYNTHESIS AND CAD; ULTRASOUND LEFT BREAST LIMITED  IMPRESSION: Suspicious left breast mass 10 o'clock position.   Adjacent suspicious satellite nodule left breast 12 o'clock position.   Palpable mass left axilla corresponds with a large lymph node.   10/27/2021 Cancer Staging   Staging form: Breast, AJCC 8th Edition - Clinical stage from 10/27/2021: Stage  IIB (cT1c, cN1, cM0, G3, ER+, PR-, HER2-) - Signed by Truitt Merle, MD on 11/07/2021 Stage prefix: Initial diagnosis Histologic grading system: 3 grade system    10/27/2021 Initial Biopsy   Diagnosis 1. Breast, left, needle core biopsy, 12 o'clock, ribbon - FIBROADENOMA - NO MALIGNANCY IDENTIFIED 2. Breast, left, needle core biopsy, 10 o'clock, coil - INVASIVE DUCTAL CARCINOMA WITH NECROSIS - DUCTAL CARCINOMA IN SITU - SEE COMMENT 3. Lymph node, needle/core biopsy, left axilla, tribell - INVASIVE DUCTAL CARCINOMA WITH NECROSIS - NO NODAL TISSUE IDENTIFIED - SEE COMMENT Microscopic Comment 2. and 2. Based on the biopsy, the carcinoma appears Nottingham grade 3 of 3 and measures 0.8 cm in greatest linear extent.  3. PROGNOSTIC INDICATORS Results: The tumor cells are NEGATIVE for Her2 (1+). Estrogen Receptor: 30%, POSITIVE, WEAK STAINING INTENSITY Progesterone Receptor: 0%, NEGATIVE Proliferation Marker Ki67: 60%   11/06/2021 Initial Diagnosis   Malignant neoplasm of upper-inner quadrant of left breast in female, estrogen receptor positive ([place])   11/22/2021 -  Chemotherapy   Patient is on Treatment Plan : BREAST ADJUVANT DOSE DENSE AC q14d / PACLitaxel q7d        RISK FACTORS:  Menarche was at age 17.  First live birth at age 58.  OCP use for approximately 0 years.  Ovaries intact: yes.  Uterus intact: yes.  Menopausal status: postmenopausal.  HRT use: 0 years. Colonoscopy: yes; normal. Mammogram within the last year: yes. Any excessive radiation exposure in the past: no  Past Medical History:  Diagnosis Date   Anxiety    Arthritis    right knee    GERD (gastroesophageal  reflux disease)    HTN (hypertension)    Hyperlipidemia    Hypertension    Positive colorectal cancer screening using Cologuard test     Past Surgical History:  Procedure Laterality Date   BREAST CYST EXCISION Right    pt stated in [place]     other GI testing     unsure but had  to drink a chalky drink    TONSILLECTOMY     TUBAL LIGATION      Social History   Socioeconomic History   Marital status: Married    Spouse name: Not on file   Number of children: 2   Years of education: Not on file   Highest education level: Not on file  Occupational History   Not on file  Tobacco Use   Smoking status: Never   Smokeless tobacco: Never  Substance and Sexual Activity   Alcohol use: No   Drug use: No   Sexual activity: Not on file  Other Topics Concern   Not on file  Social History Narrative   Not on file   Social Determinants of Health   Financial Resource Strain: Not on file  Food Insecurity: No Food Insecurity   Worried About Running Out of Food in the Last Year: Never true   Ran Out of Food in the Last Year: Never true  Transportation Needs: No Transportation Needs   Lack of Transportation (Medical): No   Lack of Transportation (Non-Medical): No  Physical Activity: Not on file  Stress: Not on file  Social Connections: Not on file     FAMILY HISTORY:  We obtained a detailed, 4-generation family history.  Significant diagnoses are listed below: Family History  Problem Relation Age of Onset   Colon polyps Sister    Colon polyps Sister    Colon polyps Sister    Colon cancer Maternal Uncle        dx. 82s   Throat cancer Maternal Uncle    Colon cancer Cousin        dx. >50   Gastric cancer Cousin        dx. >50   Breast cancer Cousin        dx. 4s   Kidney cancer Nephew    Esophageal cancer Neg Hx    Rectal cancer Neg Hx    Stomach cancer Neg Hx      Ms. Kaley's nephew was diagnosed with kidney cancer in his 63s and is deceased. She has a maternal uncle who was diagnosed with colon cancer in his 57s and second maternal uncle diagnosed with throat cancer (he smoked), both are deceased. One maternal cousin was diagnosed with colon cancer at an unknown age (>50) and a second maternal cousin was diagnosed with stomach cancer at an unknown age  (>50). She reports a paternal cousin diagnosed with breast cancer in her 76s. Of note, she has limited information about her paternal family medical history.   Ms. Delancey is unaware of previous family history of genetic testing for hereditary cancer risks. There is no reported Ashkenazi Jewish ancestry.   GENETIC COUNSELING ASSESSMENT: Ms. Ignasiak is a 66 y.o. female with a personal and family history of cancer which is somewhat suggestive of a hereditary cancer syndrome and predisposition to cancer. We, therefore, discussed and recommended the following at today's visit.   DISCUSSION: We discussed that 5 - 10% of cancer is hereditary, with most cases of hereditary breast cancer associated with mutations in BRCA1/2.  There are other genes that can be associated with hereditary breast cancer syndromes. Type of cancer risk and level of risk are gene-specific. We discussed that testing is beneficial for several reasons including knowing how to follow individuals after completing their treatment, identifying whether potential treatment options would be beneficial, and understanding if other family members could be at risk for cancer and allowing them to undergo genetic testing.   We reviewed the characteristics, features and inheritance patterns of hereditary cancer syndromes. We also discussed genetic testing, including the appropriate family members to test, the process of testing, insurance coverage and turn-around-time for results. We discussed the implications of a negative, positive and/or variant of uncertain significant result. In order to get genetic test results in a timely manner so that Ms. Wirtz can use these genetic test results for surgical decisions, we recommended Ms. Slaby pursue genetic testing for the BRCAplus. Once complete, we recommend Ms. Randal pursue reflex genetic testing to a more comprehensive gene panel.   Ms. Ryland  was offered a common hereditary cancer panel (36 genes) and an  expanded pan-cancer panel (77 genes). Ms. Seiple was informed of the benefits and limitations of each panel, including that expanded pan-cancer panels contain genes that do not have clear management guidelines at this point in time.  We also discussed that as the number of genes included on a panel increases, the chances of variants of uncertain significance increases. After considering the benefits and limitations of each gene panel, Ms. Stockburger elected to have [facility] CancerNext Panel+RNA.  The CancerNext gene panel offered by [facility] includes sequencing, rearrangement analysis, and RNA analysis for the following 36 genes:   APC, ATM, AXIN2, BARD1, BMPR1A, BRCA1, BRCA2, BRIP1, CDH1, CDK4, CDKN2A, CHEK2, DICER1, HOXB13, EPCAM, GREM1, MLH1, MSH2, MSH3, MSH6, MUTYH, NBN, NF1, NTHL1, PALB2, PMS2, POLD1, POLE, PTEN, RAD51C, RAD51D, RECQL, SMAD4, SMARCA4, STK11, and TP53.    Based on Ms. Halvorsen's personal and family history of cancer, she meets medical criteria for genetic testing. Despite that she meets criteria, she may still have an out of pocket cost. We discussed that if her out of pocket cost for testing is over $100, the laboratory should contact them to discuss self-pay prices, patient pay assistance programs, if applicable, and other billing options.   PLAN: After considering the risks, benefits, and limitations, Ms. Rashid provided informed consent to pursue genetic testing and the blood sample was sent to [facility] for analysis of the CancerNext Panel. Results should be available within approximately 1-2 weeks' time, at which point they will be disclosed by telephone to Ms. Greth, as will any additional recommendations warranted by these results. Ms. Murdy will receive a summary of her genetic counseling visit and a copy of her results once available. This information will also be available in Epic.   Ms. Slingerland questions were answered to her satisfaction today. Our contact  information was provided should additional questions or concerns arise. Thank you for the referral and allowing [place] to share in the care of your patient.   Lucille Passy, MS, [facility] Genetic Counselor [email or web address] (P) [phone]  The patient was seen for a total of 20 minutes in face-to-face genetic counseling.  The patient brought her sister. Drs. Lindi Adie and/or Burr Medico were available to discuss this case as needed.  _______________________________________________________________________ For Office Staff:  Number of people involved in session: 2 Was an Intern/ student involved with case: no

## 2021-11-10 ENCOUNTER — Telehealth: Payer: Self-pay | Admitting: Hematology

## 2021-11-10 ENCOUNTER — Telehealth: Payer: Self-pay

## 2021-11-10 NOTE — Telephone Encounter (Signed)
Attempted to reach pt regarding research study [id number] - TREATMENT OF REFRACTORY NAUSEA per Dr Ernestina Penna request. No answer, left voicemail requesting return call.  Marjie Skiff Kabao Leite, RN, BSN, [facility] She   Her   Hers Clinical Research Nurse [facility] Direct Dial [phone]   Pager [phone] 11/10/2021 10:25 AM

## 2021-11-10 NOTE — Telephone Encounter (Signed)
Spoke to patient to give updated information on upcoming appointments, patient advised she will refer to her mychart for the dates and times since she was driving a the time

## 2021-11-14 ENCOUNTER — Encounter: Payer: Self-pay | Admitting: Licensed Clinical Social Worker

## 2021-11-14 NOTE — Progress Notes (Signed)
[place] CSW Progress Note  Clinical Education officer, museum received TC from pt asking about finances as she is receiving calls about down payments prior to surgery and she does not have the cash on hand. CSW encouraged pt to work with surgical dept about payment plans.  Also discussed how to apply for Medicaid as well as cancer foundations that may be able to assist. Pt is no longer working part-time and is only receiving soc security retirement income.  Mailed information on [employer] [place] and [employer].    Christeen Douglas , LCSW

## 2021-11-15 ENCOUNTER — Ambulatory Visit
Admission: RE | Admit: 2021-11-15 | Discharge: 2021-11-15 | Disposition: A | Discharge status: Home / Self Care | Payer: Managed Care, Other (non HMO) | Source: Ambulatory Visit | Attending: Hematology | Admitting: Hematology

## 2021-11-15 DIAGNOSIS — C50212 Malignant neoplasm of upper-inner quadrant of left female breast: Secondary | ICD-10-CM

## 2021-11-15 IMAGING — MR MR BREAST BILAT WO/W CM
12 of 14 series · 32 of 48 positions shown · IV contrast (9 ML GADAVIST)
Comparison: Previous exam(s).

CLINICAL DATA: 65-year-old female who has biopsy proven invasive
ductal carcinoma and ductal carcinoma in-situ in the 10 o'clock
region of the left breast (coil clip) and invasive ductal carcinoma
in the left axilla (VINYARD clip). Patient had a fibroadenoma
biopsied from the left breast at 12 o'clock (ribbon clip).

EXAM:
BILATERAL BREAST MRI WITH AND WITHOUT CONTRAST
TECHNIQUE: Multiplanar, multisequence MR images of both breasts were obtained
prior to and following the intravenous administration of 9 ml of
Gadavist

[Series 2: t2_tirm_tra ipat (a-p) · axial · 3.0mm · 0.70mm/px · 1 of 55 slices shown]
[im 1/55]
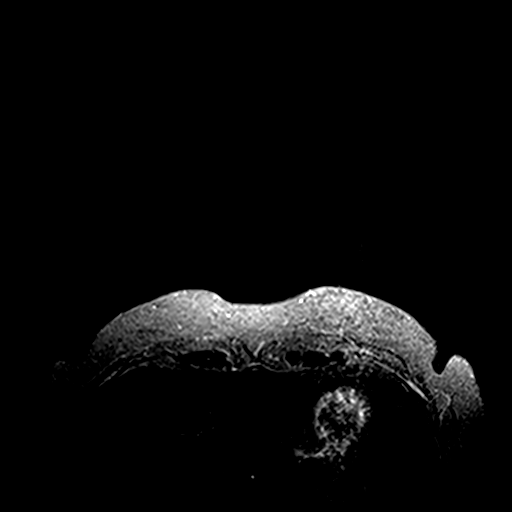

[Series 3: fl3d pre-cm no · axial · non-contrast · 1.2mm · 0.89mm/px · z∈[-57,+115]mm · 3 of 144 slices shown]
[im 1/144]
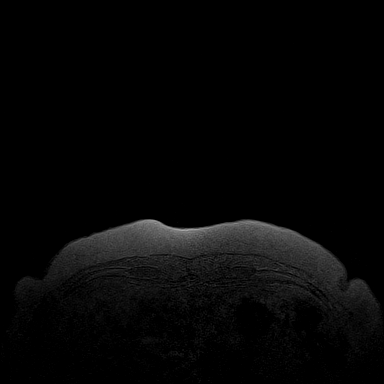
[im 72/144]
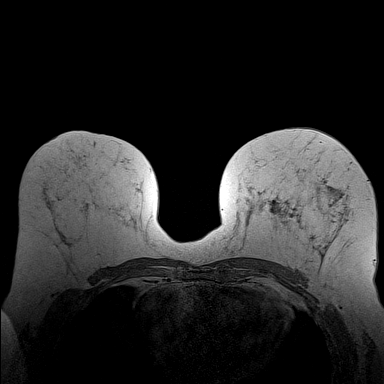
[im 144/144]
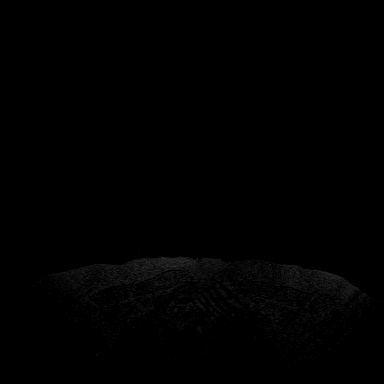

[Series 4: fl3d pre-cm · axial · non-contrast · 1.2mm · 0.89mm/px · z∈[-57,+115]mm · 3 of 144 slices shown (1 of 2)]
[im 1/144]
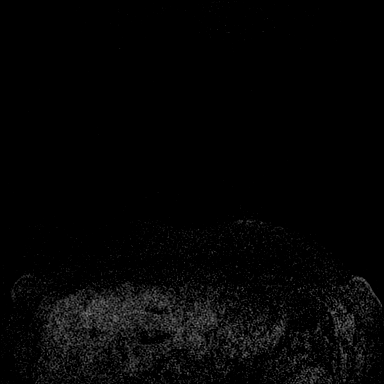
[im 72/144]
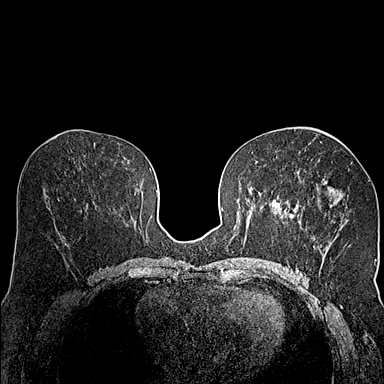
[im 144/144]
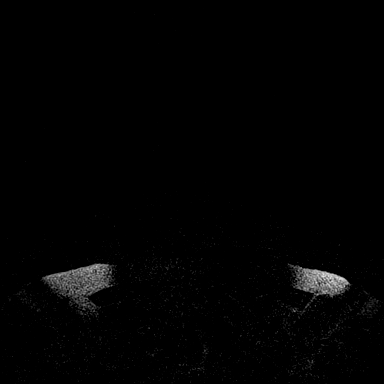

[Series 5: fl3d pre-cm · axial · non-contrast · 1.2mm · 0.91mm/px · z∈[-57,+115]mm · 3 of 144 slices shown (2 of 2)]
[im 1/144]
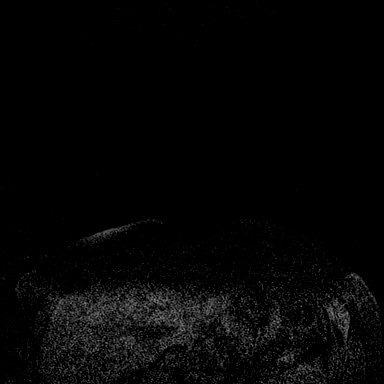
[im 72/144]
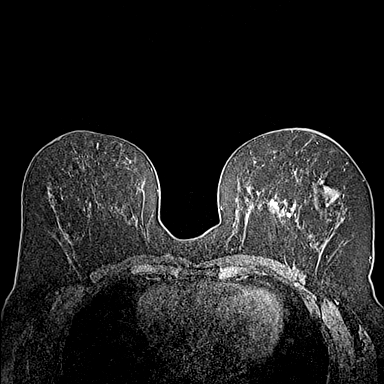
[im 144/144]
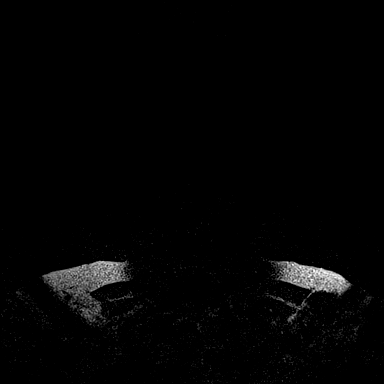

[Series 6: fl3d post-cm 20 · axial · 1.2mm · 0.91mm/px · z∈[-57,+115]mm · 3 of 144 slices shown (1 of 3)]
[im 1/144]
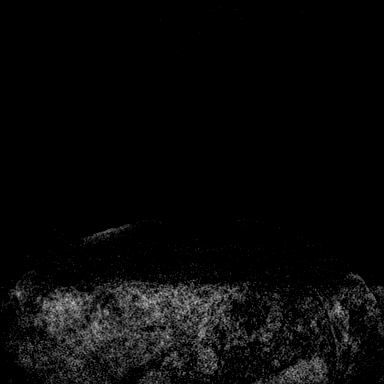
[im 72/144]
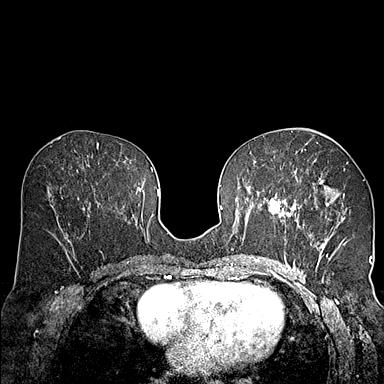
[im 144/144]
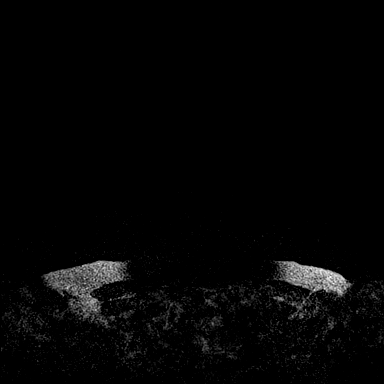

[Series 7: fl3d post-cm 20 · axial · 1.2mm · 0.91mm/px · z∈[-57,+115]mm · 3 of 144 slices shown (2 of 3)]
[im 1/144]
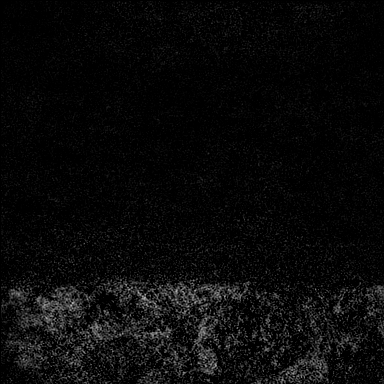
[im 72/144]
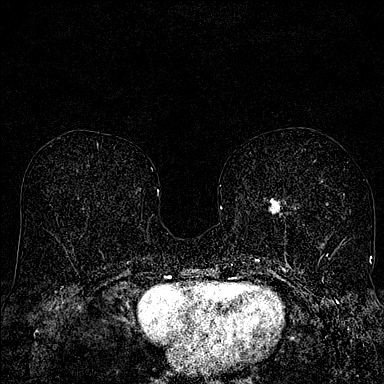
[im 144/144]
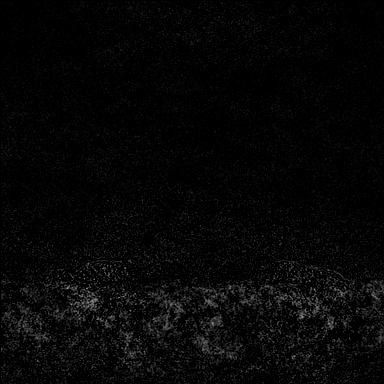

[Series 8: fl3d post-cm 20 · axial · 172.8mm · 0.91mm/px · 1 of 1 slices shown (3 of 3)]
[im 1/1]
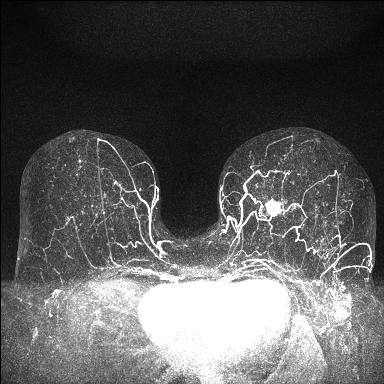

[Series 9: fl3d post-cm 3 · axial · 1.2mm · 0.91mm/px · z∈[-57,+115]mm · 3 of 144 slices shown (1 of 3)]
[im 1/144]
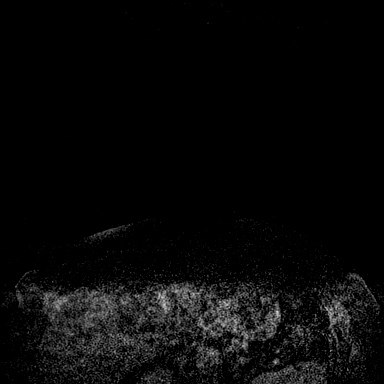
[im 72/144]
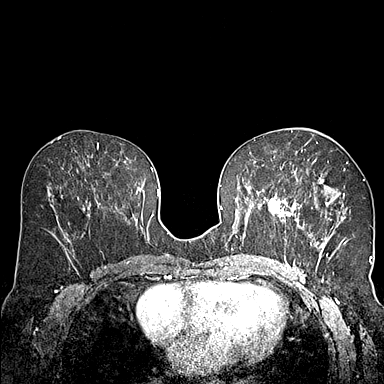
[im 144/144]
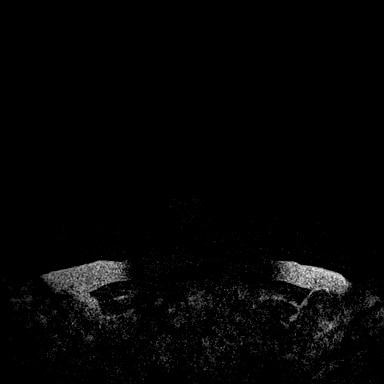

[Series 10: fl3d post-cm 3 · axial · 1.2mm · 0.91mm/px · z∈[-57,+115]mm · 4 of 144 slices shown (2 of 3)]
[im 1/144]
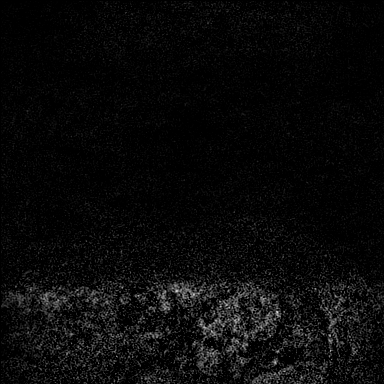
[im 48/144]
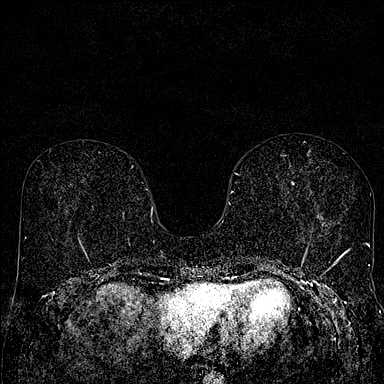
[im 96/144]
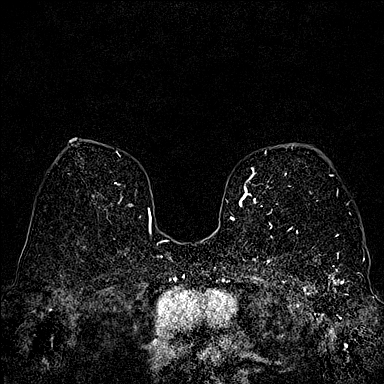
[im 144/144]
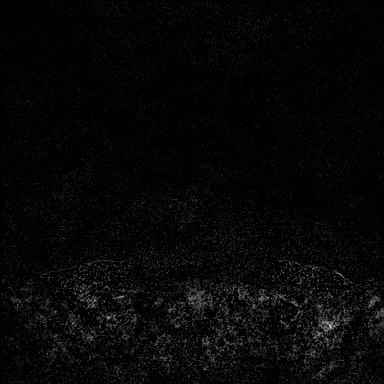

[Series 11: fl3d post-cm 3 · axial · 172.8mm · 0.91mm/px · 1 of 1 slices shown (3 of 3)]
[im 1/1]
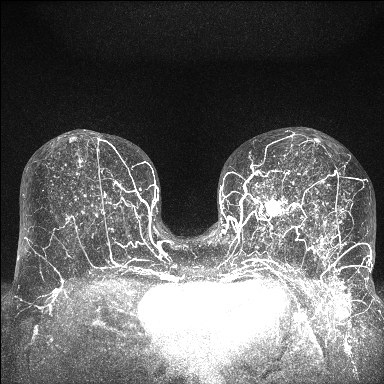

[Series 12: fl3d post-cm 5 · axial · 1.2mm · 0.91mm/px · z∈[-57,+115]mm · 4 of 144 slices shown (1 of 2)]
[im 1/144]
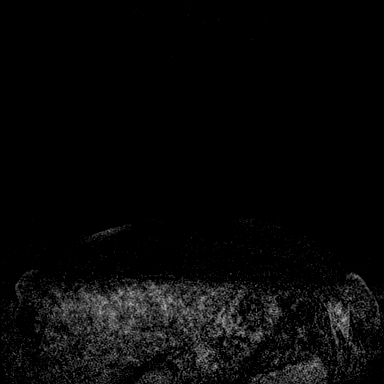
[im 48/144]
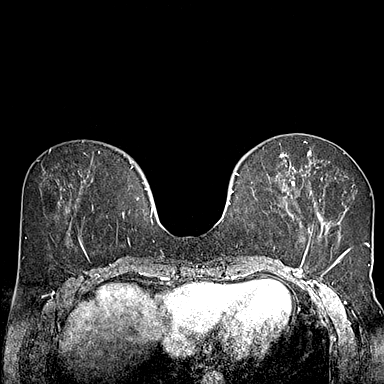
[im 96/144]
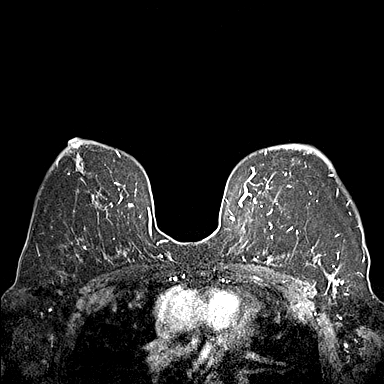
[im 144/144]
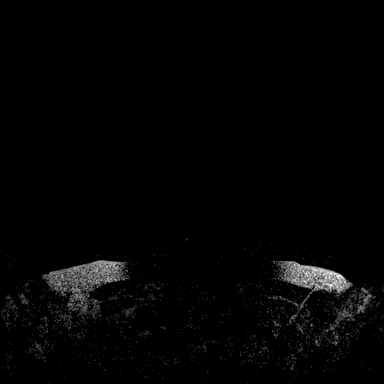

[Series 13: fl3d post-cm 5 · axial · 1.2mm · 0.91mm/px · z∈[-57,+57]mm · 3 of 144 slices shown (2 of 2)]
[im 1/144]
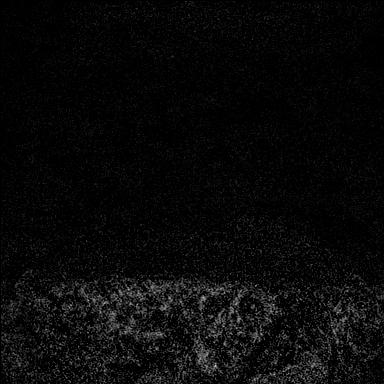
[im 48/144]
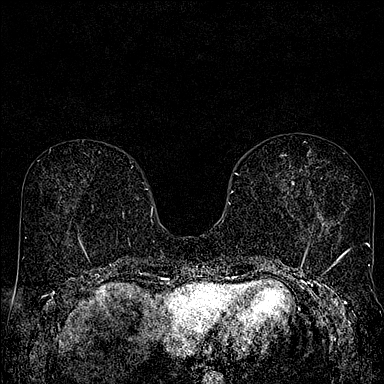
[im 96/144]
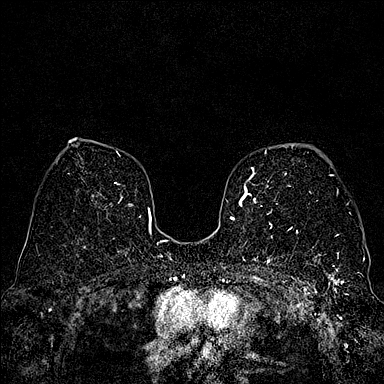

[32 of 48 positions shown; findings below may reference images not displayed]

Three-dimensional MR images were rendered by post-processing of the
original MR data on an independent workstation. The
three-dimensional MR images were interpreted, and findings are
reported in the following complete MRI report for this study. Three
dimensional images were evaluated at the independent interpreting
workstation using the DynaCAD thin client.
FINDINGS: Breast composition: c. Heterogeneous fibroglandular tissue.

Background parenchymal enhancement: Moderate.

Right breast: No mass or abnormal enhancement.

Left breast: There is an irregular enhancing mass in the upper-inner
quadrant of the left breast measuring 1.5 x 1.3 x 1.9 cm (series 6,
image 68). It is associated with a signal void artifact from the
biopsy clip. There is an additional signal void artifact in the 12
o'clock region of the breast from the biopsied benign fibroadenoma.
No additional areas of abnormal enhancement are seen in the left
breast.

Lymph nodes: There is a 4.3 cm enlarged left axillary lymph node
with a signal void artifact from the biopsy clip corresponding with
the known left axillary carcinoma (series 6 image 16). 2 adjacent
abnormal lymph nodes measuring 1.3 and 1.5 cm.

Ancillary findings: In the superior aspect of the liver is a 1.3 cm
mass that is bright on T2 imaging (series 2, image 44). It is likely
a benign cyst but additional imaging with CT or MRI is recommended.
IMPRESSION: 1.9 cm mass in the upper-inner quadrant of the left breast
corresponding with the known invasive ductal carcinoma. Enlarged
left axillary lymph node corresponding with known metastatic
disease.

RECOMMENDATION:
Treatment planning of the known left breast cancer and axillary
metastasis is recommended.

Additional imaging evaluation of the mass in the liver is
recommended with CT with liver mass protocol or MRI.

BI-RADS CATEGORY  6: Known biopsy-proven malignancy.

## 2021-11-15 MED ORDER — GADOBUTROL 1 MMOL/ML IV SOLN
9.0000 mL | Freq: Once | INTRAVENOUS | Status: AC | PRN
  Administered 2021-11-15: 9 mL via INTRAVENOUS

## 2021-11-16 ENCOUNTER — Other Ambulatory Visit: Payer: Self-pay | Admitting: Hematology

## 2021-11-16 ENCOUNTER — Encounter (HOSPITAL_BASED_OUTPATIENT_CLINIC_OR_DEPARTMENT_OTHER): Payer: Self-pay | Admitting: Surgery

## 2021-11-16 DIAGNOSIS — Z170 Estrogen receptor positive status [ER+]: Secondary | ICD-10-CM

## 2021-11-16 DIAGNOSIS — C50212 Malignant neoplasm of upper-inner quadrant of left female breast: Secondary | ICD-10-CM

## 2021-11-16 MED ORDER — LIDOCAINE-PRILOCAINE 2.5-2.5 % EX CREA: TOPICAL_CREAM | CUTANEOUS | 3 refills | Status: AC

## 2021-11-17 ENCOUNTER — Telehealth: Payer: Self-pay | Admitting: *Deleted

## 2021-11-17 NOTE — Telephone Encounter (Signed)
Spoke with patient to make sure she was aware of all her appointments since we had to move her CT scan to GI per her insurance. She states she is aware and has everything on her calendar.  Encouraged her to call should she have any questions or concerns. Patient verbalized understanding.

## 2021-11-20 ENCOUNTER — Encounter: Payer: Self-pay | Admitting: *Deleted

## 2021-11-20 ENCOUNTER — Ambulatory Visit (HOSPITAL_COMMUNITY): Payer: Managed Care, Other (non HMO)

## 2021-11-20 ENCOUNTER — Inpatient Hospital Stay: Payer: Managed Care, Other (non HMO)

## 2021-11-21 ENCOUNTER — Other Ambulatory Visit: Payer: Self-pay

## 2021-11-21 ENCOUNTER — Inpatient Hospital Stay: Payer: Managed Care, Other (non HMO)

## 2021-11-21 DIAGNOSIS — Z170 Estrogen receptor positive status [ER+]: Secondary | ICD-10-CM

## 2021-11-21 DIAGNOSIS — C50212 Malignant neoplasm of upper-inner quadrant of left female breast: Secondary | ICD-10-CM

## 2021-11-21 NOTE — Progress Notes (Signed)
Pharmacist Chemotherapy Monitoring - Initial Assessment    Anticipated start date: 11/28/21   The following has been reviewed per standard work regarding the patient's treatment regimen: The patient's diagnosis, treatment plan and drug doses, and organ/hematologic function Lab orders and baseline tests specific to treatment regimen  The treatment plan start date, drug sequencing, and pre-medications Prior authorization status  Patient's documented medication list, including drug-drug interaction screen and prescriptions for anti-emetics and supportive care specific to the treatment regimen The drug concentrations, fluid compatibility, administration routes, and timing of the medications to be used The patient's access for treatment and lifetime cumulative dose history, if applicable  The patient's medication allergies and previous infusion related reactions, if applicable   Changes made to treatment plan:  treatment plan date  Follow up needed:  Pending authorization for treatment  11/22/21 - ECHO 11/27/21 - Port placement    [place], [place].D., CPP 11/21/2021@4 :24 PM

## 2021-11-21 NOTE — Research (Signed)
Trial:  [id number] - TREATMENT OF REFRACTORY NAUSEA Patient Cassandra Brennan was identified by Dr Burr Medico as a potential candidate for the above listed study.  This Clinical Research Nurse met with [name], [id number], on 11/21/21 in a manner and location that ensures patient privacy to discuss participation in the above listed research study.  Patient is Unaccompanied.  A copy of the informed consent document and separate HIPAA Authorization was provided to the patient.  Patient reads, speaks, and understands [place].   Patient was provided with the business card of this Nurse and encouraged to contact the research team with any questions.  Approximately 15 minutes were spent with the patient reviewing the informed consent documents.  Patient was provided the option of taking informed consent documents home to review and was encouraged to review at their convenience with their support network, including other care providers. Patient took the consent documents home to review. Plan made with pt for follow up on Friday, Feb 3 via phone. Ms Both has my contact information and was encouraged to call with any questions.   Marjie Skiff Wave Calzada, RN, BSN, [facility] She   Her   Hers Clinical Research Nurse [place] [phone]   Pager [phone] 11/21/2021 11:24 AM

## 2021-11-22 ENCOUNTER — Ambulatory Visit (HOSPITAL_COMMUNITY)
Admission: RE | Admit: 2021-11-22 | Discharge: 2021-11-22 | Disposition: A | Discharge status: Home / Self Care | Payer: Commercial Managed Care - HMO | Source: Ambulatory Visit | Attending: Hematology | Admitting: Hematology

## 2021-11-22 ENCOUNTER — Other Ambulatory Visit: Payer: Self-pay | Admitting: *Deleted

## 2021-11-22 ENCOUNTER — Encounter (HOSPITAL_COMMUNITY): Payer: Self-pay

## 2021-11-22 DIAGNOSIS — Z01818 Encounter for other preprocedural examination: Secondary | ICD-10-CM | POA: Diagnosis present

## 2021-11-22 DIAGNOSIS — I10 Essential (primary) hypertension: Secondary | ICD-10-CM | POA: Insufficient documentation

## 2021-11-22 DIAGNOSIS — E785 Hyperlipidemia, unspecified: Secondary | ICD-10-CM | POA: Diagnosis not present

## 2021-11-22 DIAGNOSIS — Z170 Estrogen receptor positive status [ER+]: Secondary | ICD-10-CM

## 2021-11-22 DIAGNOSIS — Z0189 Encounter for other specified special examinations: Secondary | ICD-10-CM | POA: Diagnosis not present

## 2021-11-22 DIAGNOSIS — C50212 Malignant neoplasm of upper-inner quadrant of left female breast: Secondary | ICD-10-CM

## 2021-11-22 LAB — ECHOCARDIOGRAM COMPLETE
Area-P 1/2: 4.15 cm2
Calc EF: 63 %
S' Lateral: 2.9 cm
Single Plane A2C EF: 62.4 %
Single Plane A4C EF: 66.8 %

## 2021-11-22 IMAGING — NM NM BONE WHOLE BODY
2 series · 2 of 2 positions shown · non-contrast
Comparison: CT scan of the chest, abdomen, and pelvis [DATE]

CLINICAL DATA: Invasive breast cancer.  Initial workup.  Staging.

EXAM:
NUCLEAR MEDICINE WHOLE BODY BONE SCAN
TECHNIQUE: Whole body anterior and posterior images were obtained approximately
3 hours after intravenous injection of radiopharmaceutical.
RADIOPHARMACEUTICALS:  21.6 mCi [QY] MDP IV

[Series 1: whole body · 2.66mm/px · 1 of 1 slices shown (1 of 2)]
[im 1/1]
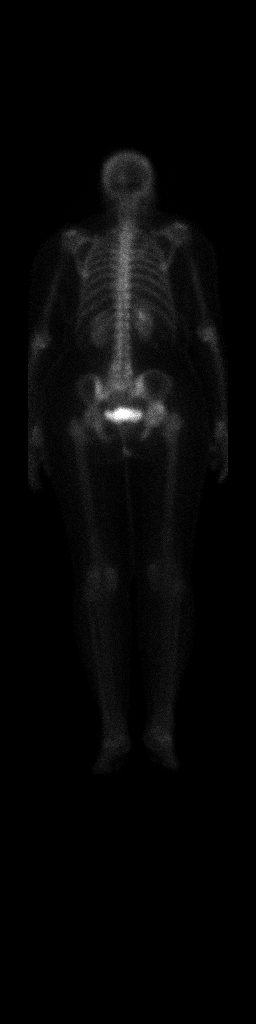

[Series 1: whole body · 2.66mm/px · 1 of 1 slices shown (2 of 2)]
[im 1/1]
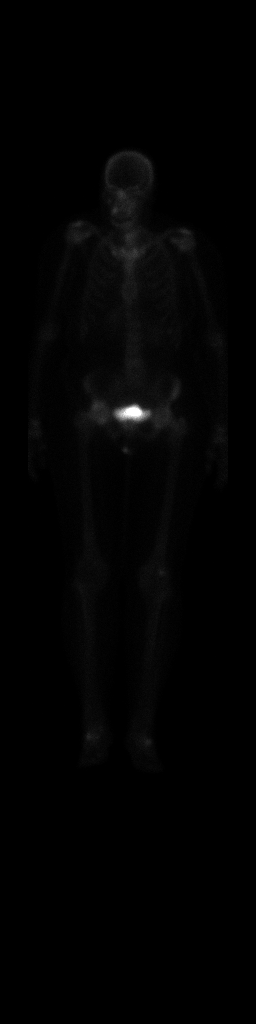

[2 of 2 positions shown; findings below may reference images not displayed]

FINDINGS: No scintigraphic evidence of bony metastatic disease is identified.
Soft tissues are normal. Degenerative changes are seen in the left
knee, the bilateral feet, the right greater than left hips, and the
sternoclavicular joints. Odontogenic changes seen in the maxilla.
IMPRESSION: No scintigraphic evidence of bony metastatic disease.

Degenerative changes as above.

## 2021-11-22 MED ORDER — TECHNETIUM TC 99M MEDRONATE IV KIT
20.0000 | PACK | Freq: Once | INTRAVENOUS | Status: AC | PRN
  Administered 2021-11-22: 21.6 via INTRAVENOUS

## 2021-11-22 NOTE — Patient Instructions (Addendum)
DUE TO COVID-19 ONLY ONE VISITOR IS ALLOWED TO COME WITH YOU AND STAY IN THE WAITING ROOM ONLY DURING PRE OP AND PROCEDURE DAY OF SURGERY IF YOU ARE GOING HOME AFTER SURGERY. IF YOU ARE SPENDING THE NIGHT 2 PEOPLE MAY VISIT WITH YOU IN YOUR PRIVATE ROOM AFTER SURGERY UNTIL VISITING  HOURS ARE OVER AT 800 PM AND 1  VISITOR  MAY  SPEND THE NIGHT.                 Cassandra Brennan     Your procedure is scheduled on: 11/24/21   Report to [facility] Main  Entrance   Report to admitting at  9:00 AM     Call this number if you have problems the morning of surgery [phone]    Remember: Do not eat food  :After Midnight the night before your surgery,   You may have clear liquids from midnight until ---.8:15 am    CLEAR LIQUID DIET   Foods Allowed                                                                     Foods Excluded  Coffee and tea, regular and decaf                             liquids that you cannot  Plain Jell-O any favor except red or purple                                           see through such as: Fruit ices (not with fruit pulp)                                     milk, soups, orange juice  Iced Popsicles                                    All solid food Carbonated beverages, regular and diet                                    Cranberry, grape and apple juices Sports drinks like Gatorade Lightly seasoned clear broth or consume(fat free) Sugar     BRUSH YOUR TEETH MORNING OF SURGERY AND RINSE YOUR MOUTH OUT, NO CHEWING GUM CANDY OR MINTS.     Take these medicines the morning of surgery with A SIP OF WATER: Metoprolol                                You may not have any metal on your body including hair pins and              piercings  Do not wear jewelry, make-up, lotions, powders or perfumes, deodorant  Do not wear nail polish on your fingernails.  Do not shave  48 hours prior to surgery.             .   Do not bring valuables to the  hospital. [place].  Contacts, dentures or bridgework may not be worn into surgery.     Patients discharged the day of surgery will not be allowed to drive home.  IF YOU ARE HAVING SURGERY AND GOING HOME THE SAME DAY, YOU MUST HAVE AN ADULT TO DRIVE YOU HOME AND BE WITH YOU FOR 24 HOURS. YOU MAY GO HOME BY TAXI OR UBER OR ORTHERWISE, BUT AN ADULT MUST ACCOMPANY YOU HOME AND STAY WITH YOU FOR 24 HOURS.  Name and phone number of your driver:  Special Instructions: N/A              Please read over the following fact sheets you were given: _____________________________________________________________________             [facility] - Preparing for Surgery Before surgery, you can play an important role.  Because skin is not sterile, your skin needs to be as free of germs as possible.  You can reduce the number of germs on your skin by washing with CHG (chlorahexidine gluconate) soap before surgery.  CHG is an antiseptic cleaner which kills germs and bonds with the skin to continue killing germs even after washing. Please DO NOT use if you have an allergy to CHG or antibacterial soaps.  If your skin becomes reddened/irritated stop using the CHG and inform your nurse when you arrive at Short Stay. Do not shave (including legs and underarms) for at least 48 hours prior to the first CHG shower.   Please follow these instructions carefully:  1.  Shower with CHG Soap the night before surgery and the  morning of Surgery.  2.  If you choose to wash your hair, wash your hair first as usual with your  normal  shampoo.  3.  After you shampoo, rinse your hair and body thoroughly to remove the  shampoo.                            4.  Use CHG as you would any other liquid soap.  You can apply chg directly  to the skin and wash                       Gently with a scrungie or clean washcloth.  5.  Apply the CHG Soap to your body ONLY FROM THE NECK DOWN.   Do not  use on face/ open                           Wound or open sores. Avoid contact with eyes, ears mouth and genitals (private parts).                       Wash face,  Genitals (private parts) with your normal soap.             6.  Wash thoroughly, paying special attention to the area where your surgery  will be performed.  7.  Thoroughly rinse your body with warm water from the neck down.  8.  DO NOT  shower/wash with your normal soap after using and rinsing off  the CHG Soap.             9.  Pat yourself dry with a clean towel.            10.  Wear clean pajamas.            11.  Place clean sheets on your bed the night of your first shower and do not  sleep with pets. Day of Surgery : Do not apply any lotions/deodorants the morning of surgery.  Please wear clean clothes to the hospital/surgery center.  FAILURE TO FOLLOW THESE INSTRUCTIONS MAY RESULT IN THE CANCELLATION OF YOUR SURGERY PATIENT SIGNATURE_________________________________  NURSE SIGNATURE__________________________________  ________________________________________________________________________

## 2021-11-22 NOTE — Progress Notes (Signed)
°  Echocardiogram 2D Echocardiogram has been performed.  Bobbye Charleston 11/22/2021, 8:51 AM

## 2021-11-23 ENCOUNTER — Other Ambulatory Visit: Payer: Managed Care, Other (non HMO)

## 2021-11-23 ENCOUNTER — Ambulatory Visit
Admission: RE | Admit: 2021-11-23 | Discharge: 2021-11-23 | Disposition: A | Discharge status: Home / Self Care | Payer: Managed Care, Other (non HMO) | Source: Ambulatory Visit | Attending: Hematology | Admitting: Hematology

## 2021-11-23 ENCOUNTER — Encounter: Payer: Self-pay | Admitting: *Deleted

## 2021-11-23 ENCOUNTER — Encounter: Payer: Self-pay | Admitting: Hematology

## 2021-11-23 DIAGNOSIS — C50212 Malignant neoplasm of upper-inner quadrant of left female breast: Secondary | ICD-10-CM

## 2021-11-23 DIAGNOSIS — Z170 Estrogen receptor positive status [ER+]: Secondary | ICD-10-CM

## 2021-11-23 IMAGING — CT CT CHEST W/ CM
2 of 3 series · 14 of 30 positions shown, 17 images · IV contrast (agent unspecified)
Comparison: None.

CLINICAL DATA: Breast cancer.  Staging.

EXAM:
CT CHEST, ABDOMEN, AND PELVIS WITH CONTRAST
TECHNIQUE: Multidetector CT imaging of the chest, abdomen and pelvis was
performed following the standard protocol during bolus
administration of intravenous contrast.

[Series 2: cap with 5mm st · axial · 0.89mm/px · z∈[-453,+42]mm · 11 of 121 slices shown, 14 images]
[im 11/121  mediastinal]
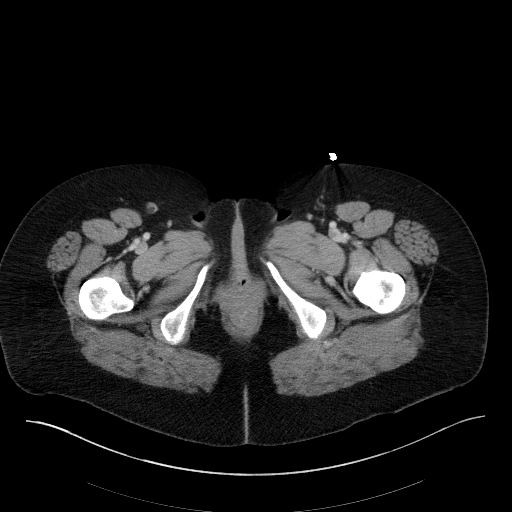
[im 11/121  lung]
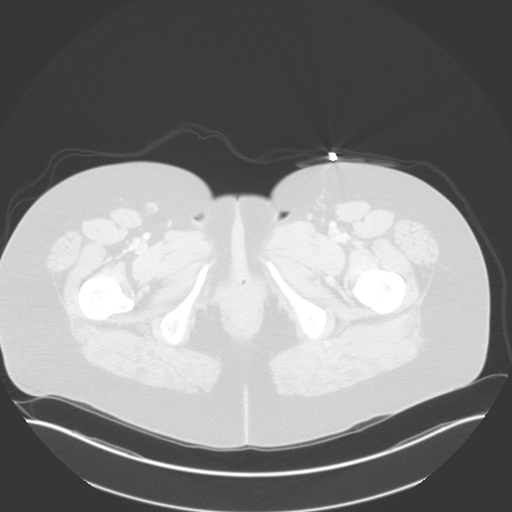
[im 22/121  lung]
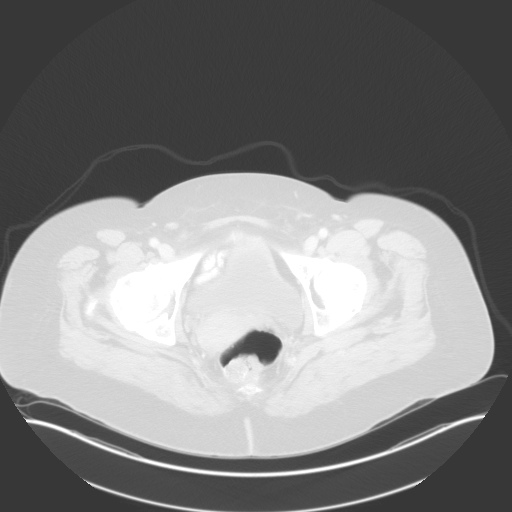
[im 33/121  lung]
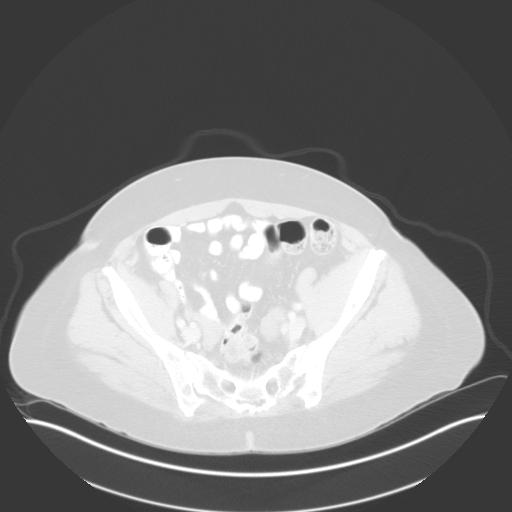
[im 44/121  lung]
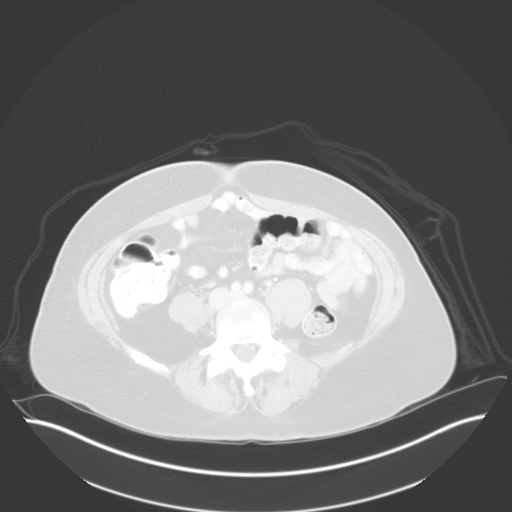
[im 55/121  mediastinal]
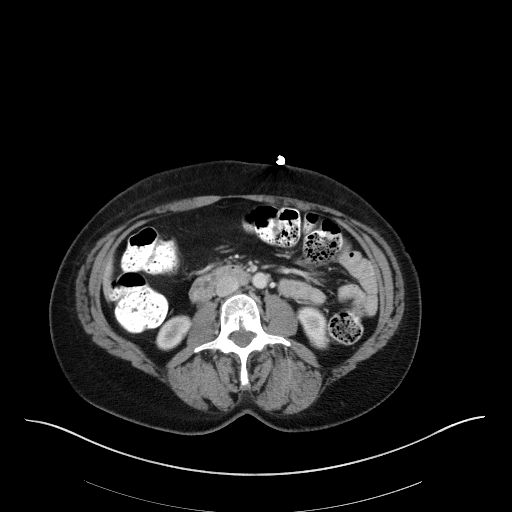
[im 55/121  lung]
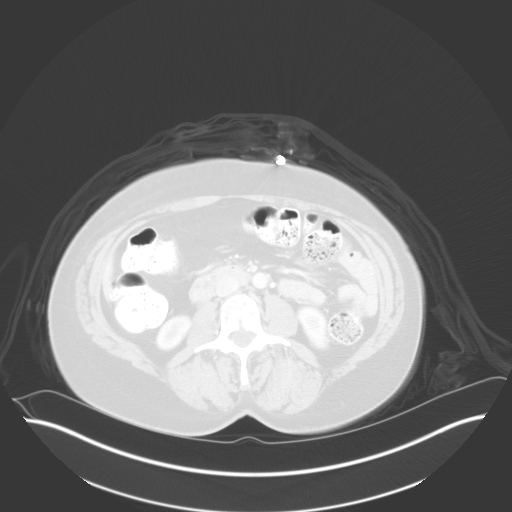
[im 59/121  lung]
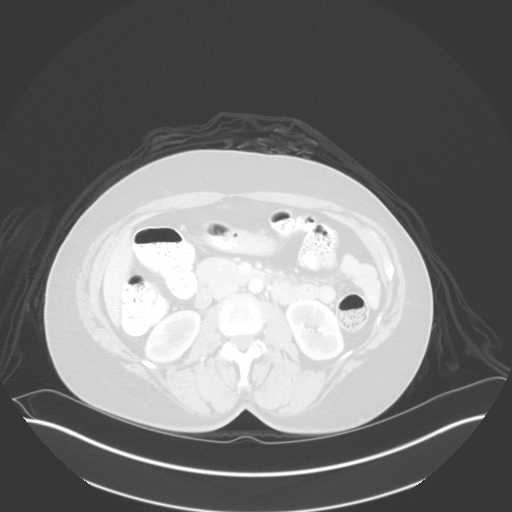
[im 66/121  lung]
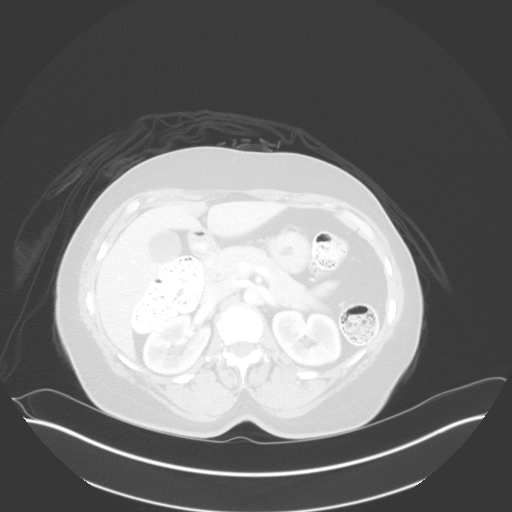
[im 77/121  lung]
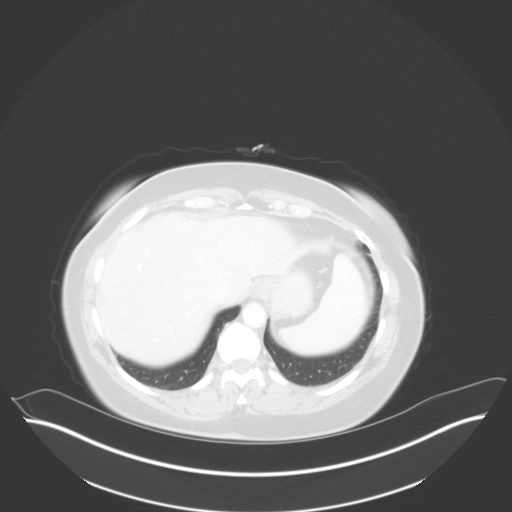
[im 88/121  mediastinal]
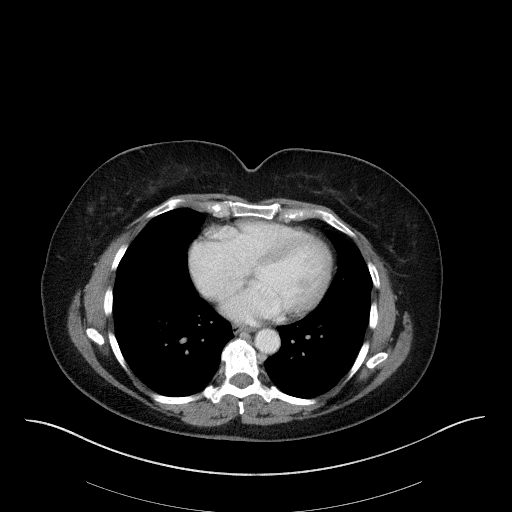
[im 88/121  lung]
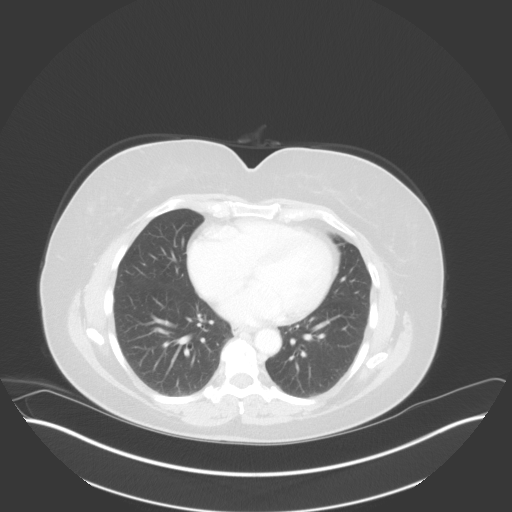
[im 99/121  lung]
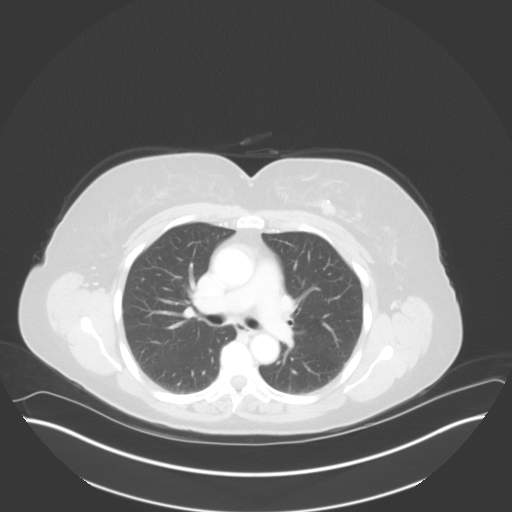
[im 110/121  lung]
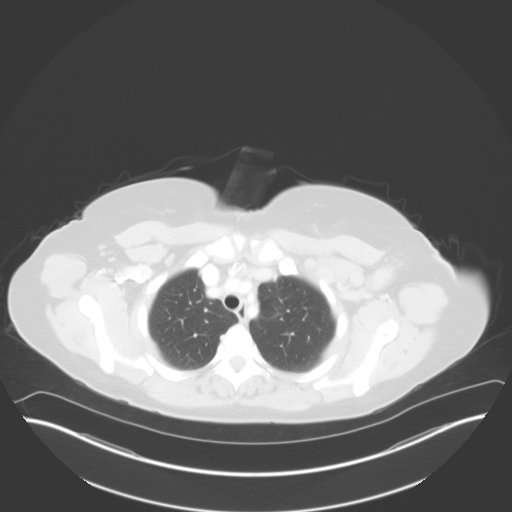

[Series 5: lung · axial · 0.87mm/px · z∈[-123,-63]mm · 3 of 121 slices shown]
[im 11/121  lung]
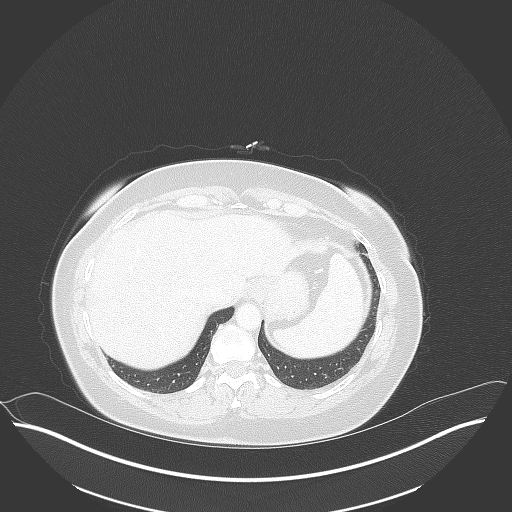
[im 31/121  lung]
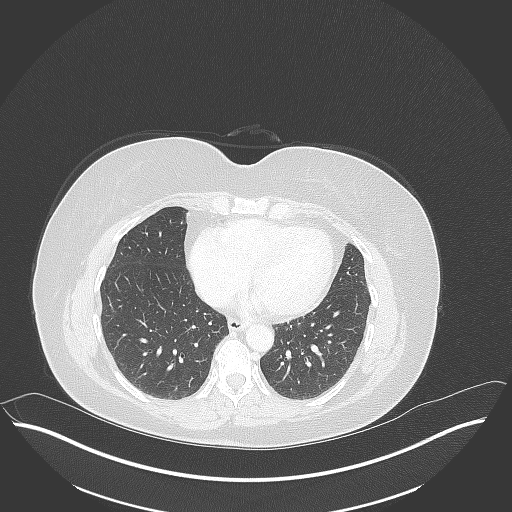
[im 41/121  lung]
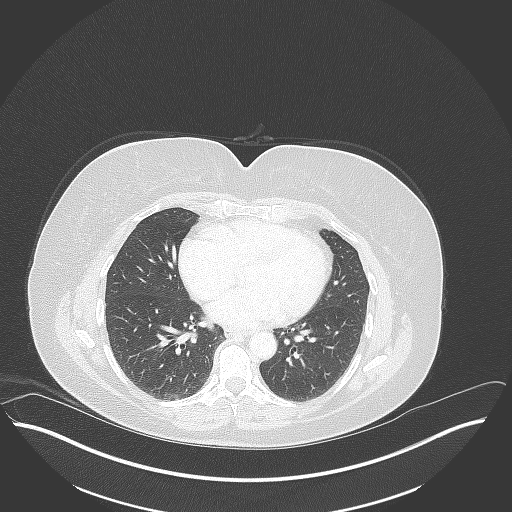

[14 of 30 positions shown; findings below may reference images not displayed]

RADIATION DOSE REDUCTION: This exam was performed according to the
departmental dose-optimization program which includes automated
exposure control, adjustment of the mA and/or kV according to
patient size and/or use of iterative reconstruction technique.

CONTRAST:  100mL [5K] IOPAMIDOL ([5K]) INJECTION 61%
FINDINGS: CT CHEST FINDINGS

Cardiovascular: The heart size is normal. No substantial pericardial
effusion. No thoracic aortic aneurysm.

Mediastinum/Nodes: No mediastinal lymphadenopathy. There is no hilar
lymphadenopathy. The esophagus has normal imaging features. No right
axillary lymphadenopathy. Bulky lymphadenopathy is seen in the left
axilla including a 5.0 x 2.8 cm lymph node on image [DATE].

Lungs/Pleura: No suspicious pulmonary nodule or mass. No focal
airspace consolidation. There is no evidence of pleural effusion.

Musculoskeletal: No worrisome lytic or sclerotic osseous
abnormality. 15 mm nodule identified in the upper inner quadrant of
the left breast.

CT ABDOMEN PELVIS FINDINGS

Hepatobiliary: 11 mm well-defined homogeneous low-density lesion in
the dome of the left liver is compatible with a cyst. Adjacent tiny
(3-4 mm hypoattenuating lesions are seen in the right liver on
images 51 and 52 of series 2, too small to characterize. Small area
of low attenuation in the anterior liver, adjacent to the falciform
ligament, is in a characteristic location for focal fatty
deposition. There is no evidence for gallstones, gallbladder wall
thickening, or pericholecystic fluid. No intrahepatic or
extrahepatic biliary dilation.

Pancreas: No focal mass lesion. No dilatation of the main duct. No
intraparenchymal cyst. No peripancreatic edema.

Spleen: No splenomegaly. No focal mass lesion.

Adrenals/Urinary Tract: No adrenal nodule or mass. Kidneys
unremarkable. No evidence for hydroureter. The urinary bladder
appears normal for the degree of distention.

Stomach/Bowel: Stomach is unremarkable. No gastric wall thickening.
No evidence of outlet obstruction. Duodenum is normally positioned
as is the ligament of Treitz. No small bowel wall thickening. No
small bowel dilatation. The terminal ileum is normal. The appendix
is best seen on coronal images and is unremarkable. No gross colonic
mass. No colonic wall thickening. A few scattered diverticuli are
seen in the left colon. Prominent stool volume raises the question
of clinical constipation.

Vascular/Lymphatic: No abdominal aortic aneurysm. No abdominal
aortic atherosclerotic calcification. There is no gastrohepatic or
hepatoduodenal ligament lymphadenopathy. No retroperitoneal or
mesenteric lymphadenopathy. No pelvic sidewall lymphadenopathy.

Reproductive: The uterus is unremarkable.  There is no adnexal mass.

Other: No intraperitoneal free fluid.

Musculoskeletal: No worrisome lytic or sclerotic osseous
abnormality. Degenerative changes noted in the hips bilaterally.
IMPRESSION: 1. Bulky lymphadenopathy in the left axilla including a 5.0 x 2.8 cm
lymph node.
2. 15 mm nodule identified in the upper inner quadrant of the left
breast.
3. No suspicious pulmonary nodule or mass. No evidence for
metastatic disease in the abdomen or pelvis.
4. 11 mm cyst in the dome of the left liver. Adjacent tiny
hypoattenuating lesions in the right liver are too small to
characterize, but likely benign. Attention on follow-up recommended.
5. Prominent stool volume raises the question of clinical
constipation.

## 2021-11-23 MED ORDER — IOPAMIDOL (ISOVUE-300) INJECTION 61%
100.0000 mL | Freq: Once | INTRAVENOUS | Status: AC | PRN
  Administered 2021-11-23: 100 mL via INTRAVENOUS

## 2021-11-24 ENCOUNTER — Encounter: Payer: Self-pay | Admitting: *Deleted

## 2021-11-24 ENCOUNTER — Telehealth: Payer: Self-pay

## 2021-11-24 ENCOUNTER — Encounter (HOSPITAL_COMMUNITY): Payer: Self-pay

## 2021-11-24 ENCOUNTER — Encounter (HOSPITAL_COMMUNITY)
Admission: RE | Admit: 2021-11-24 | Discharge: 2021-11-24 | Disposition: A | Discharge status: Home / Self Care | Payer: Commercial Managed Care - HMO | Source: Ambulatory Visit | Attending: Surgery | Admitting: Surgery

## 2021-11-24 ENCOUNTER — Other Ambulatory Visit: Payer: Self-pay

## 2021-11-24 DIAGNOSIS — Z01810 Encounter for preprocedural cardiovascular examination: Secondary | ICD-10-CM | POA: Diagnosis not present

## 2021-11-24 NOTE — Telephone Encounter (Addendum)
[id number] - TREATMENT OF REFRACTORY NAUSEA  Spoke with Cassandra Brennan to follow up on study interest. She would like to proceed with study enrollment. She is getting her port Monday 2/6 and starts chemo Tuesday 2/7. Appt made for her with me to sign consents before she gets her port accessed Tuesday.  Marjie Skiff Lionel Woodberry, RN, BSN, [facility] She   Her   Hers Clinical Research Nurse [facility] [place] Direct Dial [phone]   Pager [phone] 11/24/2021 11:14 AM

## 2021-11-24 NOTE — Progress Notes (Signed)
COVID test- NA   Bowel prep reminder:NA  PCP - Barbee Shropshire NP Cardiologist - none  Chest x-ray - no EKG - 11/24/21-chart Stress Test - no ECHO - 11/22/21-epic Cardiac Cath - no Pacemaker/ICD device last checked:NA  Sleep Study - no CPAP -   Fasting Blood Sugar - NA Checks Blood Sugar _____ times a day  Blood Thinner Instructions:NA Aspirin Instructions: Last Dose:  Anesthesia review: no  Patient denies shortness of breath, fever, cough and chest pain at PAT appointment No SOB with any activities.  Patient verbalized understanding of instructions that were given to them at the PAT appointment. Patient was also instructed that they will need to review over the PAT instructions again at home before surgery. yes

## 2021-11-24 NOTE — Telephone Encounter (Signed)
[id number] - TREATMENT OF REFRACTORY NAUSEA  Called pt back to confirm use of ordered Xanax. Pt has taken "a half" a pill one time in the past month.  Confirmed pt can read, write, understand English.  Marjie Skiff Marymargaret Kirker, RN, BSN, [facility] She   Her   Hers Clinical Research Nurse [facility] Direct Dial [phone]   Pager [phone] 11/24/2021 1:54 PM

## 2021-11-26 NOTE — Progress Notes (Signed)
[place]   [phone] [phone]   Clinic Follow up Note   Patient Care Team: Everardo Beals, NP as PCP - General Erroll Luna, MD as Consulting Physician (General Surgery) Truitt Merle, MD as Consulting Physician (Hematology) Gery Pray, MD as Consulting Physician (Radiation Oncology) [name], RN as Oncology Nurse Navigator [name], RN as Oncology Nurse Navigator 11/28/2021  CHIEF COMPLAINT: Follow-up left breast cancer, review staging work-up  SUMMARY OF ONCOLOGIC HISTORY: Oncology History Overview Note   Cancer Staging  Malignant neoplasm of upper-inner quadrant of left breast in female, estrogen receptor positive ([place]) Staging form: Breast, AJCC 8th Edition - Clinical stage from 10/27/2021: Stage IIB (cT1c, cN1, cM0, G3, ER+, PR-, HER2-) - Signed by Truitt Merle, MD on 11/07/2021    Malignant neoplasm of upper-inner quadrant of left breast in female, estrogen receptor positive ([place])  10/19/2021 Mammogram   CLINICAL DATA:  Patient presents for palpable left axillary abnormality.   EXAM: DIGITAL DIAGNOSTIC BILATERAL MAMMOGRAM WITH TOMOSYNTHESIS AND CAD; ULTRASOUND LEFT BREAST LIMITED  IMPRESSION: Suspicious left breast mass 10 o'clock position.   Adjacent suspicious satellite nodule left breast 12 o'clock position.   Palpable mass left axilla corresponds with a large lymph node.   10/27/2021 Cancer Staging   Staging form: Breast, AJCC 8th Edition - Clinical stage from 10/27/2021: Stage IIB (cT1c, cN1, cM0, G3, ER+, PR-, HER2-) - Signed by Truitt Merle, MD on 11/07/2021 Stage prefix: Initial diagnosis Histologic grading system: 3 grade system    10/27/2021 Initial Biopsy   Diagnosis 1. Breast, left, needle core biopsy, 12 o'clock, ribbon - FIBROADENOMA - NO MALIGNANCY IDENTIFIED 2. Breast, left, needle core biopsy, 10 o'clock, coil - INVASIVE DUCTAL CARCINOMA WITH NECROSIS - DUCTAL CARCINOMA IN SITU - SEE  COMMENT 3. Lymph node, needle/core biopsy, left axilla, tribell - INVASIVE DUCTAL CARCINOMA WITH NECROSIS - NO NODAL TISSUE IDENTIFIED - SEE COMMENT Microscopic Comment 2. and 2. Based on the biopsy, the carcinoma appears Nottingham grade 3 of 3 and measures 0.8 cm in greatest linear extent.  3. PROGNOSTIC INDICATORS Results: The tumor cells are NEGATIVE for Her2 (1+). Estrogen Receptor: 30%, POSITIVE, WEAK STAINING INTENSITY Progesterone Receptor: 0%, NEGATIVE Proliferation Marker Ki67: 60%   11/06/2021 Initial Diagnosis   Malignant neoplasm of upper-inner quadrant of left breast in female, estrogen receptor positive ([place])   11/15/2021 Breast MRI   IMPRESSION: 1.9 cm mass in the upper-inner quadrant of the left breast corresponding with the known invasive ductal carcinoma. Enlarged left axillary lymph node corresponding with known metastatic disease.   RECOMMENDATION: Treatment planning of the known left breast cancer and axillary metastasis is recommended.   Additional imaging evaluation of the mass in the liver is recommended with CT with liver mass protocol or MRI.   11/22/2021 Imaging   Bone scan IMPRESSION: No scintigraphic evidence of bony metastatic disease. Degenerative changes as above.   11/22/2021 Echocardiogram   Baseline echo LVEF 55-60%, normal GLS -21.7%   11/23/2021 Imaging   CT CAP IMPRESSION: 1. Bulky lymphadenopathy in the left axilla including a 5.0 x 2.8 cm lymph node. 2. 15 mm nodule identified in the upper inner quadrant of the left breast. 3. No suspicious pulmonary nodule or mass. No evidence for metastatic disease in the abdomen or pelvis. 4. 11 mm cyst in the dome of the left liver. Adjacent tiny hypoattenuating lesions in the right liver are too small to characterize, but likely benign. Attention on follow-up recommended. 5. Prominent stool volume raises the  question of clinical constipation.   11/28/2021 -  Chemotherapy   Patient is on Treatment Plan :  BREAST ADJUVANT DOSE DENSE AC q14d / PACLitaxel q7d       CURRENT THERAPY: Pending neoadjuvant chemo with AC-T starting 11/28/2021  INTERVAL HISTORY: Ms. Brymer returns for follow-up and first treatment as scheduled.  Last seen by Dr. Burr Medico 11/08/2021.  She underwent breast MRI, CT, bone scan, echo, and port placement in the meantime.  She denies any changes in her breasts.  She feels prepared to start treatment today, port placement went well.  The right neck feels tight and sore.  She has paperwork for financial assistance for [place] to complete.   MEDICAL HISTORY:  Past Medical History:  Diagnosis Date   Anxiety    Arthritis    right knee    GERD (gastroesophageal reflux disease)    Hyperlipidemia    Hypertension    Positive colorectal cancer screening using Cologuard test     SURGICAL HISTORY: Past Surgical History:  Procedure Laterality Date   BREAST CYST EXCISION Right    pt stated in [place]     other GI testing     unsure but had to drink a chalky drink    PORTACATH PLACEMENT N/A 11/27/2021   Procedure: INSERTION PORT-A-CATH;  Surgeon: Erroll Luna, MD;  Location: [facility];  Service: General;  Laterality: N/A;   TONSILLECTOMY     TUBAL LIGATION      I have reviewed the social history and family history with the patient and they are unchanged from previous note.  ALLERGIES:  is allergic to prednisone, rosuvastatin, other, robitussin (alcohol free) [guaifenesin], and sulfa antibiotics.  MEDICATIONS:  Current Outpatient Medications  Medication Sig Dispense Refill   ALPRAZolam (XANAX) 0.5 MG tablet Take 0.5 mg by mouth at bedtime as needed for anxiety.     ibuprofen (ADVIL) 800 MG tablet Take 1 tablet (800 mg total) by mouth every 8 (eight) hours as needed. 30 tablet 0   lidocaine-prilocaine (EMLA) cream Apply to affected area once 30 g 3   metoprolol succinate (TOPROL-XL) 100 MG 24 hr tablet Take 1 tablet (100 mg total) by mouth daily. Take with or immediately  following a meal. 30 tablet 0   ondansetron (ZOFRAN) 8 MG tablet Take 1 tablet (8 mg total) by mouth 2 (two) times daily as needed. Start on the third day after chemotherapy. 30 tablet 1   oxyCODONE (OXY IR/ROXICODONE) 5 MG immediate release tablet Take 1 tablet (5 mg total) by mouth every 6 (six) hours as needed for severe pain. 15 tablet 0   pravastatin (PRAVACHOL) 20 MG tablet Take 1 tablet (20 mg total) by mouth daily. 30 tablet 0   prochlorperazine (COMPAZINE) 10 MG tablet Take 1 tablet (10 mg total) by mouth every 6 (six) hours as needed (Nausea or vomiting). 30 tablet 1   No current facility-administered medications for this visit.   Facility-Administered Medications Ordered in Other Visits  Medication Dose Route Frequency Provider Last Rate Last Admin   cyclophosphamide (CYTOXAN) 1,180 mg in sodium chloride 0.9 % 250 mL chemo infusion  600 mg/m2 (Treatment Plan Recorded) Intravenous Once Truitt Merle, MD       dexamethasone (DECADRON) 10 mg in sodium chloride 0.9 % 50 mL IVPB  10 mg Intravenous Once Truitt Merle, MD 204 mL/hr at 11/28/21 1317 10 mg at 11/28/21 1317   DOXOrubicin (ADRIAMYCIN) chemo injection 118 mg  60 mg/m2 (Treatment Plan Recorded) Intravenous Once  Truitt Merle, MD       fosaprepitant (EMEND) 150 mg in sodium chloride 0.9 % 145 mL IVPB  150 mg Intravenous Once Truitt Merle, MD       heparin lock flush 100 unit/mL  500 Units Intracatheter Once PRN Truitt Merle, MD       palonosetron (ALOXI) injection 0.25 mg  0.25 mg Intravenous Once Truitt Merle, MD       sodium chloride flush (NS) 0.9 % injection 10 mL  10 mL Intracatheter PRN Truitt Merle, MD        PHYSICAL EXAMINATION: ECOG PERFORMANCE STATUS: 0 - Asymptomatic  Vitals:   11/28/21 1232  BP: (!) 151/89  Pulse: 80  Resp: 19  Temp: 98.5 F (36.9 C)  SpO2: 100%   Filed Weights   11/28/21 1232  Weight: 184 lb 3.2 oz (83.6 kg)    GENERAL:alert, no distress and comfortable SKIN: No rash EYES: sclera clear NECK: Port insertion  site closed with Dermabond LYMPH:  no palpable cervical or supraclavicular lymphadenopathy LUNGS: normal breathing effort HEART: no lower extremity edema NEURO: alert & oriented x 3 with fluent speech, no focal motor/sensory deficits PAC site closed with Dermabond, surrounding erythema from skin cleanser.  No edema Breast exam: Right breast and axilla benign.  Left breast exam reveals no nipple discharge or inversion.  Palpable 2 cm mass in the upper inner quadrant and 2 palpable lymph nodes in the axilla largest measuring 4.5 cm  LABORATORY DATA:  I have reviewed the data as listed CBC Latest Ref Rng & Units 11/28/2021 11/08/2021 03/15/2020  WBC 4.0 - 10.5 K/uL 12.0(H) 6.3 7.9  Hemoglobin 12.0 - 15.0 g/dL 11.6(L) 12.0 12.2  Hematocrit 36.0 - 46.0 % 34.4(L) 35.7(L) 36.8  Platelets 150 - 400 K/uL 241 282 275     CMP Latest Ref Rng & Units 11/28/2021 11/08/2021 03/15/2020  Glucose 70 - 99 mg/dL 104(H) 134(H) 105  BUN 8 - 23 mg/dL _0 Creatinine 0.44 - 1.00 mg/dL 0.76 0.87 1.06(H)  Sodium 135 - 145 mmol/L 141 140 141  Potassium 3.5 - 5.1 mmol/L 3.5 3.6 3.9  Chloride 98 - 111 mmol/L 107 106 107  CO2 22 - 32 mmol/L _1 Calcium 8.9 - 10.3 mg/dL 9.6 9.6 9.5  Total Protein 6.5 - 8.1 g/dL 7.3 7.6 7.1  Total Bilirubin 0.3 - 1.2 mg/dL 0.3 0.4 0.4  Alkaline Phos 38 - 126 U/L 90 100 -  AST 15 - 41 U/L 13(L) 14(L) 20  ALT 0 - 44 U/L _2 RADIOGRAPHIC STUDIES: I have personally reviewed the radiological images as listed and agreed with the findings in the report. DG Chest Port 1 View  Result Date: 11/27/2021 CLINICAL DATA:  Port-A-Cath placement.  PACU. EXAM: PORTABLE CHEST 1 VIEW COMPARISON:  Chest radiographs 09/29/2015 CT chest abdomen and pelvis 11/23/2021 FINDINGS: Right chest wall porta catheter tip overlies the superior right atrium. Cardiac silhouette appears to be at the upper limits of normal size, similar to prior CT. Mediastinal contours are within normal limits. The  lungs are clear. No pleural effusion or pneumothorax. No acute skeletal abnormality. Mild multilevel degenerative disc changes. IMPRESSION: New right chest wall porta catheter tip overlies the superior right atrium. Electronically Signed   By: Yvonne Kendall M.D.   On: 11/27/2021 14:18   DG C-Arm 1-60 Min-No Report  Result Date: 11/27/2021 Fluoroscopy was utilized by the requesting physician.  No radiographic interpretation.  ASSESSMENT & PLAN: KITA NEACE is a 66 y.o. postmenopausal female    1. 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 B/l diagnostic MM and left [place] 10/19/21 showed: 2 cm mass at 10 o'clock; 5 mm indeterminate satellite mass at 12 o'clock; palpable left axilla mass corresponds with large lymph node. Biopsy 10/27/21 confirmed invasive ductal carcinoma with necrosis to both 10 o'clock and lymph node, grade 3. ER weakly positive (30%). 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  Neoadjuvant chemotherapy has been recommended.  She was seen by Dr. Brantley Stage and will likely proceed with lumpectomy, SLNB and targeted lymph node dissection following chemo.  She would benefit from adjuvant radiation if she has lumpectomy. -I reviewed her staging work-up including bilateral breast MRI, CT, bone scan, and echo -breast MRI shows the biopsy proven malignancy in the upper inner left breat measuring 1.5 x1.3 x1.9 cm, a biopsy proven malignant 4.3 cm left axillary LN and 2 other abnormal LNs measuring 1.3 and 1.5 cm, and a 1.3 liver mass likely representing a cyst.  -CT CAP shows known left breast cancer and bulky axillary adenopathy, a liver cyst, and tiny indeterminate liver nodules too small to characterize. We will monitor these in the future. No evidence of  distant metastasis.  -Bone scan is negative for osseous metastasis.  -Baseline echo is normal, LVEF 55-60%. -I discussed the above and reviewed the potential side effects, symptom management, benefit, and curative goal of neoadjuvant chemo.  She agrees to proceed -Ms. Thomann appears stable.  Breast exam reveals a 2 cm palpable mass in the upper inner left breast and 2 palpable axillary lymph nodes largest measuring 4.5 cm.  Will monitor on neoadjuvant chemo.  I reviewed if she does not clinically respond we may proceed with interim mammo/ultrasound to monitor response.  If she does not respond expectedly on neoadjuvant chemo we may move to surgery sooner.  She understands. -Labs reviewed, adequate to proceed with first cycle [facility] today as planned, G-CSF on day 3 -She will return for labs and toxicity check 2/13, or sooner if needed  2.  Hypertension and anxiety -Continue medications. -Follow-up with primary care physician -Supportive care with active listening today and answered many questions.  He feels ready to proceed   Plan: -Labs, breast MRI, CT, bone scan, echo reviewed -Reviewed potential side effects, symptom management, and curative goal of neoadjuvant chemo -Proceed with cycle 1 AC today as planned, G-CSF on day 3 -Lab and toxicity check with me 12/04/2021, or sooner if needed -Written letter given to patient, to [place] financial assistance application    All questions were answered. The patient knows to call the clinic with any problems, questions or concerns. No barriers to learning were detected. I spent 25 minutes counseling the patient face to face. The total time spent in the appointment was 40 minutes and more than 50% was on counseling and review of test results.     [name], NP 11/28/21

## 2021-11-27 ENCOUNTER — Other Ambulatory Visit: Payer: Managed Care, Other (non HMO)

## 2021-11-27 ENCOUNTER — Ambulatory Visit (HOSPITAL_COMMUNITY)
Admission: RE | Admit: 2021-11-27 | Discharge: 2021-11-27 | Disposition: A | Discharge status: Home / Self Care | Payer: Commercial Managed Care - HMO | Attending: Surgery | Admitting: Surgery

## 2021-11-27 ENCOUNTER — Encounter: Payer: Self-pay | Admitting: *Deleted

## 2021-11-27 ENCOUNTER — Ambulatory Visit (HOSPITAL_COMMUNITY): Payer: Commercial Managed Care - HMO | Admitting: Certified Registered Nurse Anesthetist

## 2021-11-27 ENCOUNTER — Other Ambulatory Visit (HOSPITAL_COMMUNITY): Payer: Managed Care, Other (non HMO)

## 2021-11-27 ENCOUNTER — Encounter (HOSPITAL_COMMUNITY): Payer: Self-pay | Admitting: Surgery

## 2021-11-27 ENCOUNTER — Other Ambulatory Visit: Payer: Self-pay

## 2021-11-27 ENCOUNTER — Telehealth: Payer: Self-pay | Admitting: Genetic Counselor

## 2021-11-27 ENCOUNTER — Ambulatory Visit: Payer: Managed Care, Other (non HMO) | Admitting: Hematology

## 2021-11-27 ENCOUNTER — Encounter (HOSPITAL_COMMUNITY)
Admission: RE | Disposition: A | Discharge status: Home / Self Care | Payer: Self-pay | Source: Home / Self Care | Attending: Surgery

## 2021-11-27 ENCOUNTER — Ambulatory Visit (HOSPITAL_COMMUNITY): Payer: Commercial Managed Care - HMO

## 2021-11-27 ENCOUNTER — Encounter (HOSPITAL_COMMUNITY): Payer: Managed Care, Other (non HMO)

## 2021-11-27 DIAGNOSIS — Z79899 Other long term (current) drug therapy: Secondary | ICD-10-CM | POA: Insufficient documentation

## 2021-11-27 DIAGNOSIS — Z170 Estrogen receptor positive status [ER+]: Secondary | ICD-10-CM | POA: Insufficient documentation

## 2021-11-27 DIAGNOSIS — C773 Secondary and unspecified malignant neoplasm of axilla and upper limb lymph nodes: Secondary | ICD-10-CM | POA: Diagnosis not present

## 2021-11-27 DIAGNOSIS — Z452 Encounter for adjustment and management of vascular access device: Secondary | ICD-10-CM | POA: Insufficient documentation

## 2021-11-27 DIAGNOSIS — I10 Essential (primary) hypertension: Secondary | ICD-10-CM | POA: Diagnosis not present

## 2021-11-27 DIAGNOSIS — F419 Anxiety disorder, unspecified: Secondary | ICD-10-CM | POA: Insufficient documentation

## 2021-11-27 DIAGNOSIS — Z95828 Presence of other vascular implants and grafts: Secondary | ICD-10-CM

## 2021-11-27 DIAGNOSIS — E785 Hyperlipidemia, unspecified: Secondary | ICD-10-CM | POA: Diagnosis not present

## 2021-11-27 DIAGNOSIS — K219 Gastro-esophageal reflux disease without esophagitis: Secondary | ICD-10-CM | POA: Diagnosis not present

## 2021-11-27 DIAGNOSIS — C50212 Malignant neoplasm of upper-inner quadrant of left female breast: Secondary | ICD-10-CM

## 2021-11-27 DIAGNOSIS — D0512 Intraductal carcinoma in situ of left breast: Secondary | ICD-10-CM | POA: Diagnosis not present

## 2021-11-27 HISTORY — PX: PORTACATH PLACEMENT: SHX2246

## 2021-11-27 IMAGING — DX DG CHEST 1V PORT
1 series · 1 of 1 positions shown · non-contrast
Comparison: Chest radiographs [DATE] CT chest abdomen and
pelvis [DATE]

CLINICAL DATA: Port-A-Cath placement.  PACU.

EXAM:
PORTABLE CHEST 1 VIEW

[chest ap]
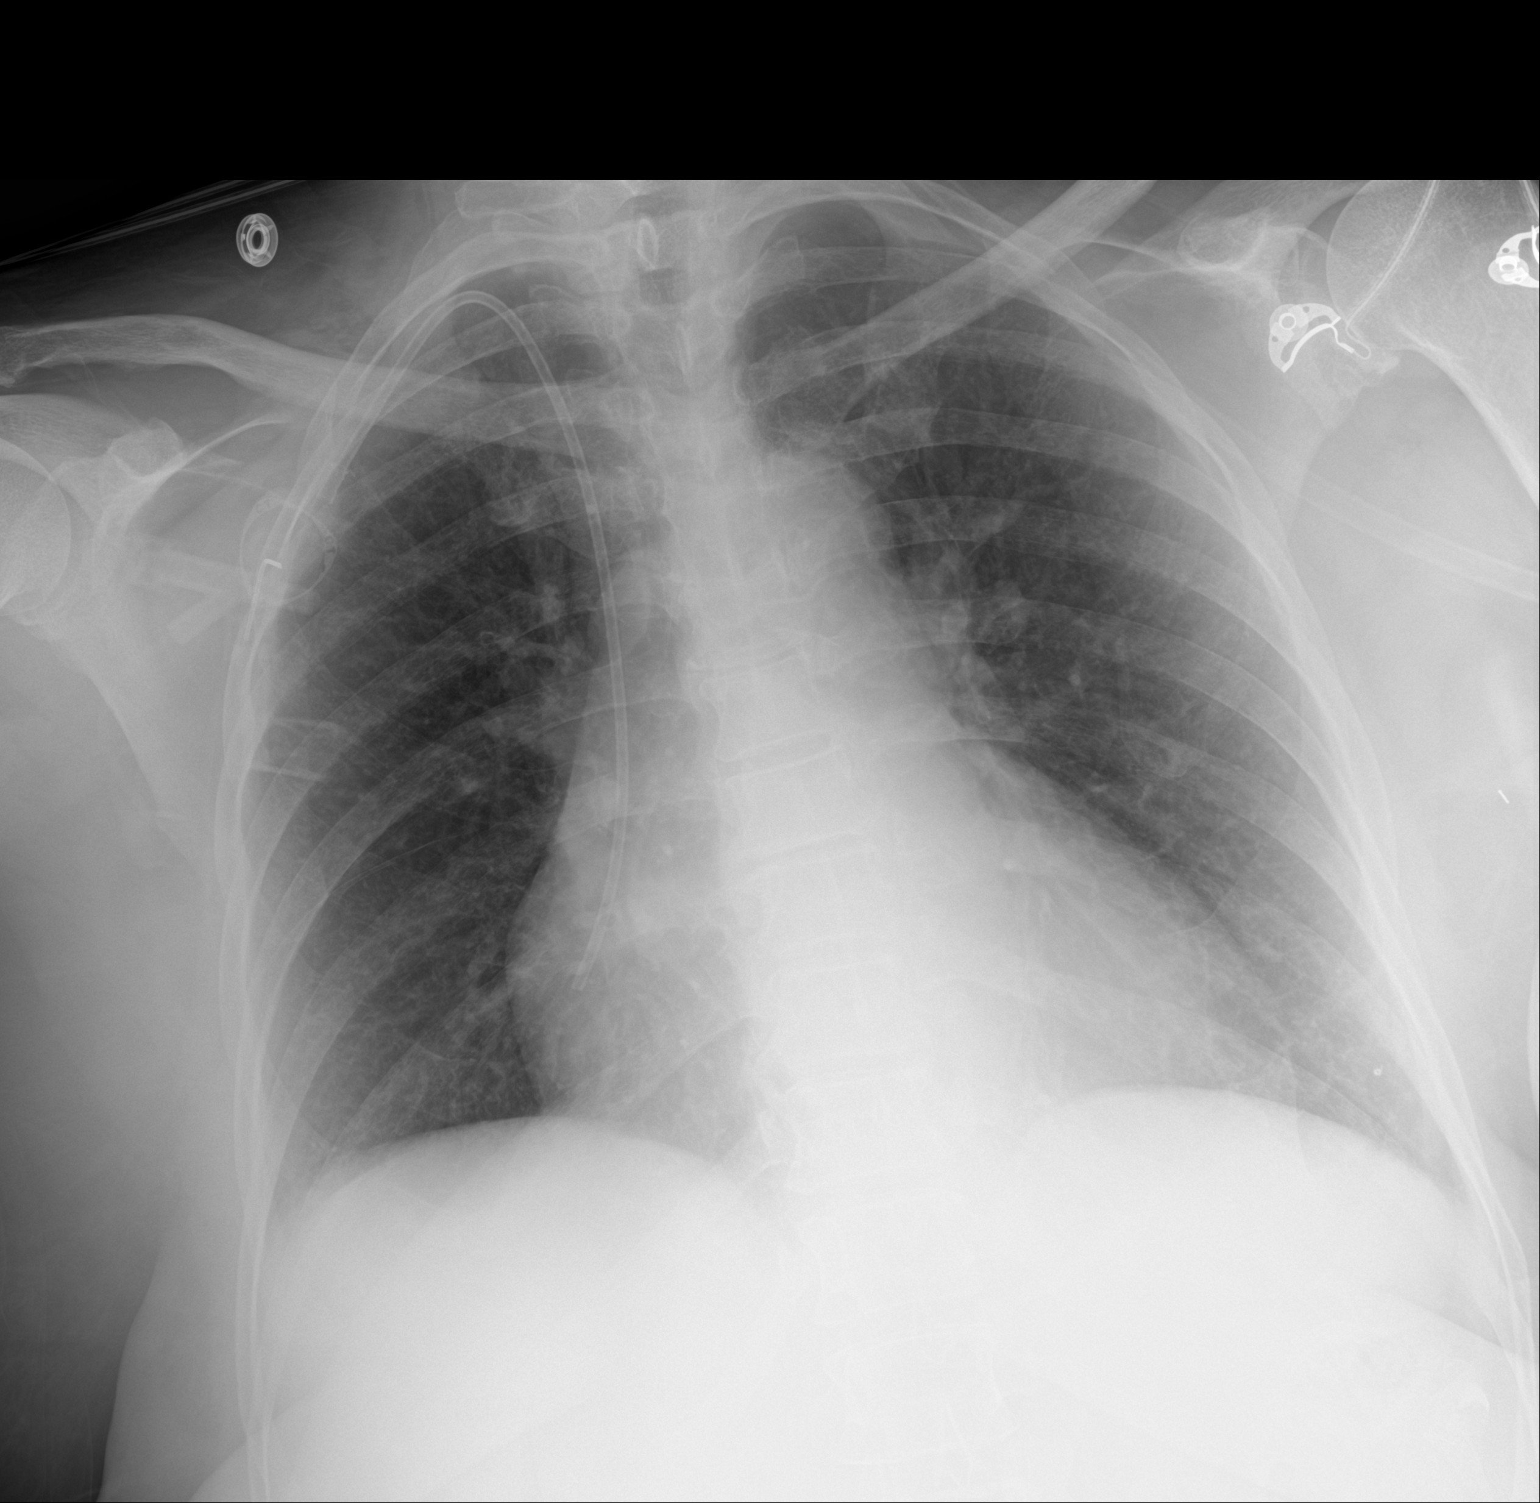

[1 of 1 positions shown; findings below may reference images not displayed]

FINDINGS: Right chest wall porta catheter tip overlies the superior right
atrium. Cardiac silhouette appears to be at the upper limits of
normal size, similar to prior CT. Mediastinal contours are within
normal limits. The lungs are clear. No pleural effusion or
pneumothorax. No acute skeletal abnormality. Mild multilevel
degenerative disc changes.
IMPRESSION: New right chest wall porta catheter tip overlies the superior right
atrium.

## 2021-11-27 SURGERY — INSERTION, TUNNELED CENTRAL VENOUS DEVICE, WITH PORT
Anesthesia: General

## 2021-11-27 MED ORDER — FENTANYL CITRATE PF 50 MCG/ML IJ SOSY: 25.0000 ug | PREFILLED_SYRINGE | INTRAMUSCULAR | Status: DC | PRN

## 2021-11-27 MED ORDER — CEFAZOLIN SODIUM-DEXTROSE 2-4 GM/100ML-% IV SOLN
2.0000 g | INTRAVENOUS | Status: AC
  Administered 2021-11-27: 2 g via INTRAVENOUS
  Filled 2021-11-27: qty 100

## 2021-11-27 MED ORDER — EPHEDRINE 5 MG/ML INJ
INTRAVENOUS | Status: AC
  Filled 2021-11-27: qty 5

## 2021-11-27 MED ORDER — MIDAZOLAM HCL 2 MG/2ML IJ SOLN
INTRAMUSCULAR | Status: DC | PRN
  Administered 2021-11-27: 2 mg via INTRAVENOUS

## 2021-11-27 MED ORDER — DEXAMETHASONE SODIUM PHOSPHATE 4 MG/ML IJ SOLN
INTRAMUSCULAR | Status: DC | PRN
  Administered 2021-11-27: 5 mg via INTRAVENOUS

## 2021-11-27 MED ORDER — HEPARIN SOD (PORK) LOCK FLUSH 100 UNIT/ML IV SOLN
INTRAVENOUS | Status: AC
  Filled 2021-11-27: qty 5

## 2021-11-27 MED ORDER — HEPARIN SOD (PORK) LOCK FLUSH 100 UNIT/ML IV SOLN
INTRAVENOUS | Status: DC | PRN
  Administered 2021-11-27: 500 [IU]

## 2021-11-27 MED ORDER — PHENYLEPHRINE 40 MCG/ML (10ML) SYRINGE FOR IV PUSH (FOR BLOOD PRESSURE SUPPORT)
PREFILLED_SYRINGE | INTRAVENOUS | Status: AC
  Filled 2021-11-27: qty 10

## 2021-11-27 MED ORDER — CHLORHEXIDINE GLUCONATE CLOTH 2 % EX PADS
6.0000 | MEDICATED_PAD | Freq: Once | CUTANEOUS | Status: DC
Start: 2021-11-27 — End: 2021-11-27

## 2021-11-27 MED ORDER — ONDANSETRON HCL 4 MG/2ML IJ SOLN
INTRAMUSCULAR | Status: DC | PRN
  Administered 2021-11-27: 4 mg via INTRAVENOUS

## 2021-11-27 MED ORDER — BUPIVACAINE-EPINEPHRINE 0.25% -1:200000 IJ SOLN
INTRAMUSCULAR | Status: DC | PRN
  Administered 2021-11-27: 20 mL

## 2021-11-27 MED ORDER — PROPOFOL 10 MG/ML IV BOLUS
INTRAVENOUS | Status: DC | PRN
  Administered 2021-11-27: 180 mg via INTRAVENOUS

## 2021-11-27 MED ORDER — IBUPROFEN 800 MG PO TABS: 800.0000 mg | ORAL_TABLET | Freq: Three times a day (TID) | ORAL | 0 refills | Status: DC | PRN

## 2021-11-27 MED ORDER — PROPOFOL 10 MG/ML IV BOLUS
INTRAVENOUS | Status: AC
  Filled 2021-11-27: qty 20

## 2021-11-27 MED ORDER — LIDOCAINE 2% (20 MG/ML) 5 ML SYRINGE
INTRAMUSCULAR | Status: DC | PRN
  Administered 2021-11-27: 80 mg via INTRAVENOUS

## 2021-11-27 MED ORDER — FENTANYL CITRATE (PF) 100 MCG/2ML IJ SOLN
INTRAMUSCULAR | Status: AC
  Filled 2021-11-27: qty 2

## 2021-11-27 MED ORDER — DEXAMETHASONE SODIUM PHOSPHATE 10 MG/ML IJ SOLN
INTRAMUSCULAR | Status: AC
  Filled 2021-11-27: qty 1

## 2021-11-27 MED ORDER — OXYCODONE HCL 5 MG PO TABS: 5.0000 mg | ORAL_TABLET | Freq: Four times a day (QID) | ORAL | 0 refills | Status: DC | PRN

## 2021-11-27 MED ORDER — HEPARIN 6000 UNIT IRRIGATION SOLUTION
Freq: Once | Status: AC
  Administered 2021-11-27: 1
  Filled 2021-11-27: qty 6000

## 2021-11-27 MED ORDER — LACTATED RINGERS IV SOLN: INTRAVENOUS | Status: DC

## 2021-11-27 MED ORDER — EPHEDRINE SULFATE-NACL 50-0.9 MG/10ML-% IV SOSY
PREFILLED_SYRINGE | INTRAVENOUS | Status: DC | PRN
  Administered 2021-11-27: 7.5 mg via INTRAVENOUS
  Administered 2021-11-27: 5 mg via INTRAVENOUS

## 2021-11-27 MED ORDER — PHENYLEPHRINE 40 MCG/ML (10ML) SYRINGE FOR IV PUSH (FOR BLOOD PRESSURE SUPPORT)
PREFILLED_SYRINGE | INTRAVENOUS | Status: DC | PRN
Start: 2021-11-27 — End: 2021-11-27
  Administered 2021-11-27: 120 ug via INTRAVENOUS

## 2021-11-27 MED ORDER — ONDANSETRON HCL 4 MG/2ML IJ SOLN
INTRAMUSCULAR | Status: AC
  Filled 2021-11-27: qty 2

## 2021-11-27 MED ORDER — CHLORHEXIDINE GLUCONATE CLOTH 2 % EX PADS: 6.0000 | MEDICATED_PAD | Freq: Once | CUTANEOUS | Status: DC

## 2021-11-27 MED ORDER — MIDAZOLAM HCL 2 MG/2ML IJ SOLN
INTRAMUSCULAR | Status: AC
  Filled 2021-11-27: qty 2

## 2021-11-27 MED ORDER — ACETAMINOPHEN 500 MG PO TABS
1000.0000 mg | ORAL_TABLET | ORAL | Status: AC
  Administered 2021-11-27: 1000 mg via ORAL
  Filled 2021-11-27: qty 2

## 2021-11-27 MED ORDER — FENTANYL CITRATE (PF) 100 MCG/2ML IJ SOLN
INTRAMUSCULAR | Status: DC | PRN
  Administered 2021-11-27: 50 ug via INTRAVENOUS
  Administered 2021-11-27 (×2): 25 ug via INTRAVENOUS

## 2021-11-27 MED ORDER — LIDOCAINE HCL (PF) 2 % IJ SOLN
INTRAMUSCULAR | Status: AC
  Filled 2021-11-27: qty 5

## 2021-11-27 MED FILL — Dexamethasone Sodium Phosphate Inj 100 MG/10ML: INTRAMUSCULAR | Qty: 1 | Status: AC

## 2021-11-27 MED FILL — Fosaprepitant Dimeglumine For IV Infusion 150 MG (Base Eq): INTRAVENOUS | Qty: 5 | Status: AC

## 2021-11-27 SURGICAL SUPPLY — 44 items
ADH SKN CLS APL DERMABOND .7 (GAUZE/BANDAGES/DRESSINGS) ×1
APL PRP STRL LF DISP 70% ISPRP (MISCELLANEOUS) ×1
APL SKNCLS STERI-STRIP NONHPOA (GAUZE/BANDAGES/DRESSINGS)
BAG DECANTER FOR FLEXI CONT (MISCELLANEOUS) ×2 IMPLANT
BENZOIN TINCTURE PRP APPL 2/3 (GAUZE/BANDAGES/DRESSINGS) IMPLANT
BLADE HEX COATED 2.75 (ELECTRODE) ×2 IMPLANT
BLADE SURG 15 STRL LF DISP TIS (BLADE) ×1 IMPLANT
BLADE SURG 15 STRL SS (BLADE) ×2
CHLORAPREP W/TINT 26 (MISCELLANEOUS) ×2 IMPLANT
COVER BACK TABLE 60X90IN (DRAPES) ×2 IMPLANT
COVER MAYO STAND STRL (DRAPES) ×2 IMPLANT
DERMABOND ADVANCED (GAUZE/BANDAGES/DRESSINGS) ×1
DERMABOND ADVANCED .7 DNX12 (GAUZE/BANDAGES/DRESSINGS) ×1 IMPLANT
DRAPE LAPAROSCOPIC ABDOMINAL (DRAPES) ×2 IMPLANT
DRAPE UTILITY XL STRL (DRAPES) ×2 IMPLANT
DRSG TEGADERM 2-3/8X2-3/4 SM (GAUZE/BANDAGES/DRESSINGS) IMPLANT
DRSG TEGADERM 4X4.75 (GAUZE/BANDAGES/DRESSINGS) IMPLANT
DRSG TEGADERM 6X8 (GAUZE/BANDAGES/DRESSINGS) ×1 IMPLANT
GAUZE SPONGE 4X4 12PLY STRL (GAUZE/BANDAGES/DRESSINGS) ×1 IMPLANT
GLOVE SRG 8 PF TXTR STRL LF DI (GLOVE) ×1 IMPLANT
GLOVE SURG UNDER POLY LF SZ8 (GLOVE) ×2
GOWN STRL REUS W/ TWL LRG LVL3 (GOWN DISPOSABLE) ×2 IMPLANT
GOWN STRL REUS W/ TWL XL LVL3 (GOWN DISPOSABLE) ×1 IMPLANT
GOWN STRL REUS W/TWL LRG LVL3 (GOWN DISPOSABLE) ×4
GOWN STRL REUS W/TWL XL LVL3 (GOWN DISPOSABLE) ×2
KIT PORT POWER 8FR ISP CVUE (Port) ×1 IMPLANT
NDL HYPO 25X1 1.5 SAFETY (NEEDLE) ×1 IMPLANT
NDL SAFETY ECLIPSE 18X1.5 (NEEDLE) IMPLANT
NDL SPNL 22GX3.5 QUINCKE BK (NEEDLE) IMPLANT
NEEDLE HYPO 18GX1.5 SHARP (NEEDLE)
NEEDLE HYPO 22GX1.5 SAFETY (NEEDLE) IMPLANT
NEEDLE HYPO 25X1 1.5 SAFETY (NEEDLE) ×2 IMPLANT
NEEDLE SPNL 22GX3.5 QUINCKE BK (NEEDLE) IMPLANT
PENCIL SMOKE EVACUATOR (MISCELLANEOUS) ×2 IMPLANT
SLEEVE SCD COMPRESS KNEE MED (STOCKING) ×2 IMPLANT
SPIKE FLUID TRANSFER (MISCELLANEOUS) IMPLANT
SPONGE T-LAP 4X18 ~~LOC~~+RFID (SPONGE) IMPLANT
STRIP CLOSURE SKIN 1/2X4 (GAUZE/BANDAGES/DRESSINGS) IMPLANT
SUT MON AB 4-0 PC3 18 (SUTURE) ×2 IMPLANT
SUT PROLENE 2 0 CT2 30 (SUTURE) IMPLANT
SUT PROLENE 2 0 SH DA (SUTURE) ×2 IMPLANT
SUT VICRYL 3-0 CR8 SH (SUTURE) ×2 IMPLANT
SYR CONTROL 10ML LL (SYRINGE) ×2 IMPLANT
YANKAUER SUCT BULB TIP NO VENT (SUCTIONS) IMPLANT

## 2021-11-27 NOTE — Telephone Encounter (Signed)
I attempted to contact Cassandra Brennan to discuss her genetic testing results (36 genes). I left a voicemail requesting she call me back at [phone].  Lucille Passy, MS, [facility] Genetic Counselor [email or web address] (P) [phone]

## 2021-11-27 NOTE — H&P (Signed)
History of Present Illness: Cassandra Brennan is a 66 y.o. female who is seen today as an office consultation at the request of Dr. Pasty Arch for evaluation of Breast Cancer .   Patient presents to the [facility] for evaluation of left breast mass. This was discovered on mammography. Location left central to upper outer quadrant. There are 2 masses. The more central mass measures approximately 1.6 cm at 10:00 second mass is 5 mm at 12:00. The these are each about 4 cm from the nipple. There is also a large axillary lymph node as well. Core biopsy showed invasive ductal carcinoma grade 3 ER weakly positive PR negative HER2/neu negative with a Ki-67 of 40%. Lymph node was positive for metastatic disease. She has no complaints except pain after the biopsy.  Review of Systems: A complete review of systems was obtained from the patient. I have reviewed this information and discussed as appropriate with the patient. See HPI as well for other ROS.    Medical History: Past Medical History:  Diagnosis Date   Anxiety   Arthritis   GERD (gastroesophageal reflux disease)   History of cancer   Hyperlipidemia   Hypertension   Patient Active Problem List  Diagnosis   Malignant neoplasm of upper-inner quadrant of left breast in female, estrogen receptor positive (CMS-HCC)   Hypertension   Past Surgical History:  Procedure Laterality Date   LAPAROSCOPIC TUBAL LIGATION N/A   TONSILLECTOMY N/A  Date Unknown    Allergies  Allergen Reactions   Guaifenesin Hives   Other Other (See Comments)  Had a rxn to cortisone knee injection - had elevated BP and HR   Current Outpatient Medications on File Prior to Visit  Medication Sig Dispense Refill   ALPRAZolam (XANAX) 0.5 MG tablet Take by mouth   fluticasone propionate (FLONASE) 50 mcg/actuation nasal spray fluticasone propionate 50 mcg/actuation nasal spray,suspension   metoprolol succinate (TOPROL-XL) 100 MG XL tablet   pravastatin (PRAVACHOL) 20 MG tablet Take 20  mg by mouth once daily   No current facility-administered medications on file prior to visit.   Family History  Problem Relation Age of Onset   High blood pressure (Hypertension) Mother   Obesity Sister   High blood pressure (Hypertension) Sister   High blood pressure (Hypertension) Brother   Colon cancer Maternal Uncle   Diabetes Maternal Grandmother   Stroke Maternal Grandfather   Diabetes Paternal Grandmother   Diabetes Son   Diabetes Daughter    Social History   Tobacco Use  Smoking Status Never  Smokeless Tobacco Never    Social History   Socioeconomic History   Marital status: Unknown  Tobacco Use   Smoking status: Never   Smokeless tobacco: Never  Vaping Use   Vaping Use: Never used  Substance and Sexual Activity   Alcohol use: Not Currently   Drug use: Never   Objective:  There were no vitals filed for this visit.  There is no height or weight on file to calculate BMI.  Physical Exam Constitutional:  Appearance: Normal appearance.  HENT:  Head: Normocephalic.  Eyes:  General: No scleral icterus. Pupils: Pupils are equal, round, and reactive to light.  Cardiovascular:  Rate and Rhythm: Normal rate.  Pulmonary:  Effort: Pulmonary effort is normal.  Breath sounds: No stridor.  Chest:  Breasts: Right: Normal.   Musculoskeletal:  General: Normal range of motion.  Cervical back: Normal range of motion and neck supple.  Lymphadenopathy:  Upper Body:  Right upper body: Axillary adenopathy present. No  supraclavicular adenopathy.  Left upper body: No supraclavicular or axillary adenopathy.  Skin: General: Skin is warm and dry.  Neurological:  General: No focal deficit present.  Mental Status: She is alert and oriented to person, place, and time.  Psychiatric:  Mood and Affect: Mood normal.  Behavior: Behavior normal.     Labs, Imaging and Diagnostic Testing:  ADDENDUM REPORT: 10/27/2021 10:50   ADDENDUM: Addendum for correction of  impression and recommendation.   In the original impression, the satellite nodule was incorrectly labeled at the 10 o'clock position. It is at the 12 o'clock position.   Recommendation:   Ultrasound-guided core needle biopsy left breast mass 10 o'clock position.   Ultrasound-guided core needle biopsy adjacent suspicious satellite nodule left breast 12 o'clock position.   Ultrasound-guided core needle biopsy of the thickened left axillary lymph node.     Electronically Signed By: Lovey Newcomer M.D. On: 10/27/2021 10:50    Addended by Lovey Newcomer, MD on 10/27/2021 12:50 PM   Study Result  Narrative & Impression  CLINICAL DATA: Patient presents for palpable left axillary abnormality.   EXAM: DIGITAL DIAGNOSTIC BILATERAL MAMMOGRAM WITH TOMOSYNTHESIS AND CAD; ULTRASOUND LEFT BREAST LIMITED   TECHNIQUE: Bilateral digital diagnostic mammography and breast tomosynthesis was performed. The images were evaluated with computer-aided detection.; Targeted ultrasound examination of the left breast was performed.   COMPARISON: None.   ACR Breast Density Category c: The breast tissue is heterogeneously dense, which may obscure small masses.   FINDINGS: Underlying the palpable marker within the left axilla is a large mass favored to represent an enlarged lymph node.   Within the upper inner left breast posterior depth there is a persistent irregular mass. There is a smaller oval circumscribed mass within the left breast just lateral to the dominant mass. No additional concerning findings within either breast.   Targeted ultrasound is performed, showing a 1.6 x 2.0 x 1.0 cm irregular hypoechoic mass left breast 10 o'clock position 4 cm from nipple.   There is an adjacent 5 x 4 x 5 mm irregular hypoechoic mass left breast 12 o'clock position 4 cm from the nipple.   Additionally, in this area is a 4 x 5 x 3 mm complicated cyst left breast 11 o'clock position 4 cm from nipple.    Palpable mass within the left axilla corresponds with a markedly enlarged lymph node.   IMPRESSION: Suspicious left breast mass 10 o'clock position.   Adjacent suspicious satellite nodule left breast 10 o'clock position.   Palpable mass left axilla corresponds with a large lymph node.   RECOMMENDATION: Ultrasound-guided core needle biopsy left breast mass 10 o'clock position.   Ultrasound-guided core needle biopsy of the adjacent suspicious satellite nodule left breast 10 o'clock position.   Ultrasound-guided core needle biopsy of the thickened left axillary lymph node.   I have discussed the findings and recommendations with the patient. If applicable, a reminder letter will be sent to the patient regarding the next appointment.   BI-RADS CATEGORY 5: Highly suggestive of malignancy.   Electronically Signed: By: Lovey Newcomer M.D. On: 10/19/2021 16:23  ADDITIONAL INFORMATION: 3. PROGNOSTIC INDICATORS Results: IMMUNOHISTOCHEMICAL AND MORPHOMETRIC ANALYSIS PERFORMED MANUALLY The tumor cells are NEGATIVE for Her2 (1+). Estrogen Receptor: 30%, POSITIVE, WEAK STAINING INTENSITY Progesterone Receptor: 0%, NEGATIVE Proliferation Marker Ki67: 60% REFERENCE RANGE ESTROGEN RECEPTOR NEGATIVE 0% POSITIVE =>1% REFERENCE RANGE PROGESTERONE RECEPTOR NEGATIVE 0% POSITIVE =>1% All controls stained appropriately Thressa Sheller MD Pathologist, Electronic Signature ( Signed 11/01/2021) FINAL DIAGNOSIS Diagnosis 1. Breast, left,  needle core biopsy, 12 o'clock, ribbon - FIBROADENOMA - NO MALIGNANCY IDENTIFIED 2. Breast, left, needle core biopsy, 10 o'clock, coil - INVASIVE DUCTAL CARCINOMA WITH NECROSIS 1 of 3 FINAL for HARLYN, RATHMANN [phone]) Diagnosis(continued) - DUCTAL CARCINOMA IN SITU - SEE COMMENT 3. Lymph node, needle/core biopsy, left axilla, tribell - INVASIVE DUCTAL CARCINOMA WITH NECROSIS - NO NODAL TISSUE IDENTIFIED - SEE COMMENT Microscopic Comment 2. and 2.  Based on the biopsy, the carcinoma appears Nottingham grade 3 of 3 and measures 0.8 cm in greatest linear extent. Prognostic markers (ER/PR/ki-67/HER2) are pending and will be reported in an addendum. Dr. Spero Curb reviewed the case and agrees with the above diagnosis. These results were called to [facility] on October 30, 2021. Thressa Sheller MD Pathologist, Electronic Signature (Case signed 10/30/2021)    Assessment and Plan:   Diagnoses and all orders for this visit:  Breast cancer, stage 2, left (CMS-HCC)   Recommend neoadjuvant chemotherapy. Discussed port placement. Risk and benefits of port placement and rationale for placing 1 discussed today. Complications of bleeding, infection, pneumothorax, hemothorax, injury to major vascular structures and neurologic structures, injury to mediastinal structures including cardiac structures as well as complications of thrombosis migration embolization and infection.  Plan will be for MRI with neoadjuvant chemotherapy with potential for breast conserving surgery after treatment. Consider targeted lymph node biopsy at the time of surgery. She may possibly require left axillary lymph node dissection due to the bulky adenopathy but we will see how she responds to chemotherapy. This to be followed by radiation therapy and potentially antiestrogen therapy.  No follow-ups on file.  Kennieth Francois, MD

## 2021-11-27 NOTE — Op Note (Signed)
Preoperative diagnosis: PAC needed for neoadjuvant chemotherapy   Postoperative diagnosis: Same  Procedure: Portacath Placement with C arm and U/S guidance   Surgeon: Turner Daniels, MD, FACS  Anesthesia: General and 0.25 % marcaine with epinephrine  Clinical History and Indications: The patient is getting ready to begin chemotherapy for her cancer. She  needs a Port-A-Cath for venous access. Risk of bleeding, infection,  Collapse lung,  Death,  DVT,  Organ injury,  Mediastinal injury,  Injury to heart,  Injury to blood vessels,  Nerves,  Migration of catheter,  Embolization of catheter and the need for more surgery.  Description of Procedure: I have seen the patient in the holding area and confirmed the plans for the procedure as noted above. I reviewed the risks and complications again and the patient has no further questions. She wishes to proceed.   The patient was then taken to the operating room. After satisfactory general  anesthesia had been obtained the upper chest and lower neck were prepped and draped as a sterile field. The timeout was done.  The right internal jugular vein  was entered under U/S guidance  and the guidewire threaded into the superior vena cava right atrial area under fluoroscopic guidance. An incision was then made on the anterior chest wall and a subcutaneous pocket fashioned for the port reservoir.  The port tubing was then brought through a subcutaneous tunnel from the port site to the guidewire site.  The port and catheter were attached, locked  and flushed. The catheter was measured and cut to appropriate length.The dilator and peel-away sheath were then advanced over the guidewire while monitoring this with fluoroscopy. The guidewire and dilator were removed and the tubing threaded to approximately 21 cm. The peel-away sheath was then removed. The catheter aspirated and flushed easily. Using fluoroscopy the tip was in the superior vena cava right atrial  junction area. It aspirated and flushed easily. That aspirated and flushed easily.  The reservoir was secured to the fascia with 1 sutures of 2-0 Prolene. A final check with fluoroscopy was done to make sure we had no kinks and good positioning of the tip of the catheter. Everything appeared to be okay. The catheter was aspirated, flushed with dilute heparin and then concentrated aqueous heparin. The port was left accessed.  The incision was then closed with interrupted 3-0 Vicryl, and 4-0 Monocryl subcuticular with Dermabond on the skin. Occlusive dressing applied.  There were no operative complications. Estimated blood loss was minimal. All counts were correct. The patient tolerated the procedure well.  Turner Daniels, MD, FACS

## 2021-11-27 NOTE — Anesthesia Preprocedure Evaluation (Signed)
Anesthesia Evaluation  Patient identified by MRN, date of birth, ID band Patient awake    Reviewed: Allergy & Precautions, NPO status , Patient's Chart, lab work & pertinent test results  Airway Mallampati: II  TM Distance: >3 FB     Dental   Pulmonary neg pulmonary ROS,    breath sounds clear to auscultation       Cardiovascular hypertension,  Rhythm:Regular Rate:Normal     Neuro/Psych negative neurological ROS     GI/Hepatic Neg liver ROS, GERD  ,  Endo/Other  negative endocrine ROS  Renal/GU negative Renal ROS     Musculoskeletal   Abdominal   Peds  Hematology   Anesthesia Other Findings   Reproductive/Obstetrics                             Anesthesia Physical Anesthesia Plan  ASA: 3  Anesthesia Plan:    Post-op Pain Management:    Induction: Intravenous  PONV Risk Score and Plan: 3 and Ondansetron, Dexamethasone and Midazolam  Airway Management Planned: LMA  Additional Equipment:   Intra-op Plan:   Post-operative Plan:   Informed Consent: I have reviewed the patients History and Physical, chart, labs and discussed the procedure including the risks, benefits and alternatives for the proposed anesthesia with the patient or authorized representative who has indicated his/her understanding and acceptance.     Dental advisory given  Plan Discussed with: CRNA and Anesthesiologist  Anesthesia Plan Comments:         Anesthesia Quick Evaluation

## 2021-11-27 NOTE — Transfer of Care (Signed)
Immediate Anesthesia Transfer of Care Note  Patient: Cassandra Brennan  Procedure(s) Performed: INSERTION PORT-A-CATH  Patient Location: PACU  Anesthesia Type:General  Level of Consciousness: awake and patient cooperative  Airway & Oxygen Therapy: Patient Spontanous Breathing and Patient connected to face mask  Post-op Assessment: Report given to RN and Post -op Vital signs reviewed and stable  Post vital signs: Reviewed and stable  Last Vitals:  Vitals Value Taken Time  BP 127/70 11/27/21 1313  Temp    Pulse 75 11/27/21 1314  Resp 19 11/27/21 1314  SpO2 100 % 11/27/21 1314  Vitals shown include unvalidated device data.  Last Pain:  Vitals:   11/27/21 0918  TempSrc: Oral  PainSc: 0-No pain         Complications: No notable events documented.

## 2021-11-27 NOTE — Anesthesia Procedure Notes (Signed)
Procedure Name: LMA Insertion Date/Time: 11/27/2021 12:15 PM Performed by: Claudia Desanctis, CRNA Pre-anesthesia Checklist: Emergency Drugs available, Patient identified, Suction available and Patient being monitored Patient Re-evaluated:Patient Re-evaluated prior to induction Oxygen Delivery Method: Circle system utilized Preoxygenation: Pre-oxygenation with 100% oxygen Induction Type: IV induction Ventilation: Mask ventilation without difficulty LMA: LMA inserted LMA Size: 4.0 Number of attempts: 1 Placement Confirmation: positive ETCO2 and breath sounds checked- equal and bilateral Tube secured with: Tape Dental Injury: Teeth and Oropharynx as per pre-operative assessment

## 2021-11-27 NOTE — Anesthesia Postprocedure Evaluation (Signed)
Anesthesia Post Note  Patient: Cassandra Brennan  Procedure(s) Performed: INSERTION PORT-A-CATH     Patient location during evaluation: PACU Anesthesia Type: General Level of consciousness: awake Pain management: pain level controlled Vital Signs Assessment: post-procedure vital signs reviewed and stable Respiratory status: spontaneous breathing Cardiovascular status: stable Postop Assessment: no apparent nausea or vomiting Anesthetic complications: no   No notable events documented.  Last Vitals:  Vitals:   11/27/21 1313 11/27/21 1315  BP: 127/70 123/67  Pulse: 82 75  Resp: 20 19  Temp: 36.5 C   SpO2: 100% 100%    Last Pain:  Vitals:   11/27/21 1315  TempSrc:   PainSc: Asleep                 Veronique Warga

## 2021-11-27 NOTE — Discharge Instructions (Signed)
PORT-A-CATH: POST OP INSTRUCTIONS  Always review your discharge instruction sheet given to you by the facility where your surgery was performed.   A prescription for pain medication may be given to you upon discharge. Take your pain medication as prescribed, if needed. If narcotic pain medicine is not needed, then you make take acetaminophen (Tylenol) or ibuprofen (Advil) as needed.  Take your usually prescribed medications unless otherwise directed. If you need a refill on your pain medication, please contact our office. All narcotic pain medicine now requires a paper prescription.  Phoned in and fax refills are no longer allowed by law.  Prescriptions will not be filled after 5 pm or on weekends.  You should follow a light diet for the remainder of the day after your procedure. Most patients will experience some mild swelling and/or bruising in the area of the incision. It may take several days to resolve. It is common to experience some constipation if taking pain medication after surgery. Increasing fluid intake and taking a stool softener (such as Colace) will usually help or prevent this problem from occurring. A mild laxative (Milk of Magnesia or Miralax) should be taken according to package directions if there are no bowel movements after 48 hours.  Unless discharge instructions indicate otherwise, you may remove your bandages 48 hours after surgery, and you may shower at that time. You may have steri-strips (small white skin tapes) in place directly over the incision.  These strips should be left on the skin for 7-10 days.  If your surgeon used Dermabond (skin glue) on the incision, you may shower in 24 hours.  The glue will flake off over the next 2-3 weeks.  If your port is left accessed at the end of surgery (needle left in port), the dressing cannot get wet and should only by changed by a healthcare professional. When the port is no longer accessed (when the needle has been removed), follow  step 7.   ACTIVITIES:  Limit activity involving your arms for the next 72 hours. Do no strenuous exercise or activity for 1 week. You may drive when you are no longer taking prescription pain medication, you can comfortably wear a seatbelt, and you can maneuver your car. 10.You may need to see your doctor in the office for a follow-up appointment.  Please       check with your doctor.  11.When you receive a new Port-a-Cath, you will get a product guide and        ID card.  Please keep them in case you need them.  WHEN TO CALL YOUR DOCTOR ([phone]): Fever over 101.0 Chills Continued bleeding from incision Increased redness and tenderness at the site Shortness of breath, difficulty breathing   The clinic staff is available to answer your questions during regular business hours. Please don't hesitate to call and ask to speak to one of the nurses or medical assistants for clinical concerns. If you have a medical emergency, go to the nearest emergency room or call 911.  A surgeon from [facility] is always on call at the hospital.     For further information, please visit [email or web address]      

## 2021-11-27 NOTE — Interval H&P Note (Signed)
History and Physical Interval Note:  11/27/2021 10:26 AM  Cassandra Brennan  has presented today for surgery, with the diagnosis of BREAST CANCER.  The various methods of treatment have been discussed with the patient and family. After consideration of risks, benefits and other options for treatment, the patient has consented to  Procedure(s): INSERTION PORT-A-CATH (N/A) as a surgical intervention.  The patient's history has been reviewed, patient examined, no change in status, stable for surgery.  I have reviewed the patient's chart and labs.  Questions were answered to the patient's satisfaction.     [employer]

## 2021-11-28 ENCOUNTER — Other Ambulatory Visit: Payer: Self-pay

## 2021-11-28 ENCOUNTER — Inpatient Hospital Stay: Payer: Commercial Managed Care - HMO

## 2021-11-28 ENCOUNTER — Inpatient Hospital Stay: Payer: Commercial Managed Care - HMO | Attending: Hematology

## 2021-11-28 ENCOUNTER — Inpatient Hospital Stay: Payer: Commercial Managed Care - HMO | Admitting: Nurse Practitioner

## 2021-11-28 ENCOUNTER — Encounter (HOSPITAL_COMMUNITY): Payer: Self-pay | Admitting: Surgery

## 2021-11-28 ENCOUNTER — Encounter: Payer: Self-pay | Admitting: Emergency Medicine

## 2021-11-28 VITALS — BP 151/89 | HR 80 | Temp 98.5°F | Resp 19 | Ht 65.0 in | Wt 184.2 lb

## 2021-11-28 DIAGNOSIS — Z888 Allergy status to other drugs, medicaments and biological substances status: Secondary | ICD-10-CM | POA: Diagnosis not present

## 2021-11-28 DIAGNOSIS — I10 Essential (primary) hypertension: Secondary | ICD-10-CM | POA: Insufficient documentation

## 2021-11-28 DIAGNOSIS — R110 Nausea: Secondary | ICD-10-CM | POA: Insufficient documentation

## 2021-11-28 DIAGNOSIS — C50212 Malignant neoplasm of upper-inner quadrant of left female breast: Secondary | ICD-10-CM

## 2021-11-28 DIAGNOSIS — Z5189 Encounter for other specified aftercare: Secondary | ICD-10-CM | POA: Diagnosis not present

## 2021-11-28 DIAGNOSIS — M898X9 Other specified disorders of bone, unspecified site: Secondary | ICD-10-CM | POA: Diagnosis not present

## 2021-11-28 DIAGNOSIS — Z79899 Other long term (current) drug therapy: Secondary | ICD-10-CM | POA: Diagnosis not present

## 2021-11-28 DIAGNOSIS — K7689 Other specified diseases of liver: Secondary | ICD-10-CM | POA: Insufficient documentation

## 2021-11-28 DIAGNOSIS — Z170 Estrogen receptor positive status [ER+]: Secondary | ICD-10-CM

## 2021-11-28 DIAGNOSIS — Z882 Allergy status to sulfonamides status: Secondary | ICD-10-CM | POA: Diagnosis not present

## 2021-11-28 DIAGNOSIS — R5383 Other fatigue: Secondary | ICD-10-CM | POA: Diagnosis not present

## 2021-11-28 DIAGNOSIS — F419 Anxiety disorder, unspecified: Secondary | ICD-10-CM | POA: Diagnosis not present

## 2021-11-28 DIAGNOSIS — C773 Secondary and unspecified malignant neoplasm of axilla and upper limb lymph nodes: Secondary | ICD-10-CM | POA: Diagnosis not present

## 2021-11-28 DIAGNOSIS — Z5111 Encounter for antineoplastic chemotherapy: Secondary | ICD-10-CM | POA: Diagnosis present

## 2021-11-28 DIAGNOSIS — Z95828 Presence of other vascular implants and grafts: Secondary | ICD-10-CM | POA: Insufficient documentation

## 2021-11-28 LAB — CBC WITH DIFFERENTIAL (CANCER CENTER ONLY)
Abs Immature Granulocytes: 0.06 10*3/uL (ref 0.00–0.07)
Basophils Absolute: 0 10*3/uL (ref 0.0–0.1)
Basophils Relative: 0 %
Eosinophils Absolute: 0.1 10*3/uL (ref 0.0–0.5)
Eosinophils Relative: 1 %
HCT: 34.4 % — ABNORMAL LOW (ref 36.0–46.0)
Hemoglobin: 11.6 g/dL — ABNORMAL LOW (ref 12.0–15.0)
Immature Granulocytes: 1 %
Lymphocytes Relative: 15 %
Lymphs Abs: 1.8 10*3/uL (ref 0.7–4.0)
MCH: 28.4 pg (ref 26.0–34.0)
MCHC: 33.7 g/dL (ref 30.0–36.0)
MCV: 84.1 fL (ref 80.0–100.0)
Monocytes Absolute: 0.6 10*3/uL (ref 0.1–1.0)
Monocytes Relative: 5 %
Neutro Abs: 9.4 10*3/uL — ABNORMAL HIGH (ref 1.7–7.7)
Neutrophils Relative %: 78 %
Platelet Count: 241 10*3/uL (ref 150–400)
RBC: 4.09 MIL/uL (ref 3.87–5.11)
RDW: 14.5 % (ref 11.5–15.5)
WBC Count: 12 10*3/uL — ABNORMAL HIGH (ref 4.0–10.5)
nRBC: 0 % (ref 0.0–0.2)

## 2021-11-28 LAB — RESEARCH LABS

## 2021-11-28 LAB — CMP (CANCER CENTER ONLY)
ALT: 21 U/L (ref 0–44)
AST: 13 U/L — ABNORMAL LOW (ref 15–41)
Albumin: 4.3 g/dL (ref 3.5–5.0)
Alkaline Phosphatase: 90 U/L (ref 38–126)
Anion gap: 7 (ref 5–15)
BUN: 12 mg/dL (ref 8–23)
CO2: 27 mmol/L (ref 22–32)
Calcium: 9.6 mg/dL (ref 8.9–10.3)
Chloride: 107 mmol/L (ref 98–111)
Creatinine: 0.76 mg/dL (ref 0.44–1.00)
GFR, Estimated: >60 mL/min (ref >60)
Glucose, Bld: 104 mg/dL — ABNORMAL HIGH (ref 70–99)
Potassium: 3.5 mmol/L (ref 3.5–5.1)
Sodium: 141 mmol/L (ref 135–145)
Total Bilirubin: 0.3 mg/dL (ref 0.3–1.2)
Total Protein: 7.3 g/dL (ref 6.5–8.1)

## 2021-11-28 MED ORDER — SODIUM CHLORIDE 0.9% FLUSH
10.0000 mL | Freq: Once | INTRAVENOUS | Status: AC
  Administered 2021-11-28: 10 mL

## 2021-11-28 MED ORDER — PALONOSETRON HCL INJECTION 0.25 MG/5ML
0.2500 mg | Freq: Once | INTRAVENOUS | Status: AC
  Administered 2021-11-28: 0.25 mg via INTRAVENOUS
  Filled 2021-11-28: qty 5

## 2021-11-28 MED ORDER — SODIUM CHLORIDE 0.9% FLUSH
10.0000 mL | INTRAVENOUS | Status: DC | PRN
  Administered 2021-11-28: 10 mL

## 2021-11-28 MED ORDER — HEPARIN SOD (PORK) LOCK FLUSH 100 UNIT/ML IV SOLN
500.0000 [IU] | Freq: Once | INTRAVENOUS | Status: AC | PRN
  Administered 2021-11-28: 500 [IU]

## 2021-11-28 MED ORDER — DOXORUBICIN HCL CHEMO IV INJECTION 2 MG/ML
60.0000 mg/m2 | Freq: Once | INTRAVENOUS | Status: AC
  Administered 2021-11-28: 118 mg via INTRAVENOUS
  Filled 2021-11-28: qty 59

## 2021-11-28 MED ORDER — SODIUM CHLORIDE 0.9 % IV SOLN
10.0000 mg | Freq: Once | INTRAVENOUS | Status: AC
  Administered 2021-11-28: 10 mg via INTRAVENOUS
  Filled 2021-11-28: qty 10

## 2021-11-28 MED ORDER — SODIUM CHLORIDE 0.9 % IV SOLN: Freq: Once | INTRAVENOUS | Status: AC

## 2021-11-28 MED ORDER — SODIUM CHLORIDE 0.9 % IV SOLN
150.0000 mg | Freq: Once | INTRAVENOUS | Status: AC
  Administered 2021-11-28: 150 mg via INTRAVENOUS
  Filled 2021-11-28: qty 150

## 2021-11-28 MED ORDER — SODIUM CHLORIDE 0.9 % IV SOLN
600.0000 mg/m2 | Freq: Once | INTRAVENOUS | Status: AC
  Administered 2021-11-28: 1180 mg via INTRAVENOUS
  Filled 2021-11-28: qty 59

## 2021-11-28 NOTE — Research (Addendum)
[id number] - TREATMENT OF REFRACTORY NAUSEA  11/28/21  This Coordinator has reviewed this patient's inclusion and exclusion criteria and confirmed Cassandra Brennan is eligible for study participation.  Patient will continue with enrollment.  Menopausal status (women only): Cassandra Brennan is post menopausal.  Clabe Seal Clinical Research Coordinator I  11/28/21 12:51 PM

## 2021-11-28 NOTE — Patient Instructions (Addendum)
Cassandra Brennan ONCOLOGY  Discharge Instructions: Thank you for choosing [facility] to provide your oncology and hematology care.   If you have a lab appointment with the [name], please go directly to the [facility] and check in at the registration area.   Wear comfortable clothing and clothing appropriate for easy access to any Portacath or PICC line.   We strive to give you quality time with your provider. You may need to reschedule your appointment if you arrive late (15 or more minutes).  Arriving late affects you and other patients whose appointments are after yours.  Also, if you miss three or more appointments without notifying the office, you may be dismissed from the clinic at the providers discretion.      For prescription refill requests, have your pharmacy contact our office and allow 72 hours for refills to be completed.    Today you received the following chemotherapy and/or immunotherapy agents: Doxorubicin and  Cyclophosphamide     To help prevent nausea and vomiting after your treatment, we encourage you to take your nausea medication as directed.  BELOW ARE SYMPTOMS THAT SHOULD BE REPORTED IMMEDIATELY: *FEVER GREATER THAN 100.4 F (38 C) OR HIGHER *CHILLS OR SWEATING *NAUSEA AND VOMITING THAT IS NOT CONTROLLED WITH YOUR NAUSEA MEDICATION *UNUSUAL SHORTNESS OF BREATH *UNUSUAL BRUISING OR BLEEDING *URINARY PROBLEMS (pain or burning when urinating, or frequent urination) *BOWEL PROBLEMS (unusual diarrhea, constipation, pain near the anus) TENDERNESS IN MOUTH AND THROAT WITH OR WITHOUT PRESENCE OF ULCERS (sore throat, sores in mouth, or a toothache) UNUSUAL RASH, SWELLING OR PAIN  UNUSUAL VAGINAL DISCHARGE OR ITCHING   Items with * indicate a potential emergency and should be followed up as soon as possible or go to the Emergency Department if any problems should occur.  Please show the CHEMOTHERAPY ALERT CARD or IMMUNOTHERAPY  ALERT CARD at check-in to the Emergency Department and triage nurse.  Should you have questions after your visit or need to cancel or reschedule your appointment, please contact [facility]  Dept: [phone]  and follow the prompts.  Office hours are 8:00 a.m. to 4:30 p.m. Monday - Friday. Please note that voicemails left after 4:00 p.m. may not be returned until the following business day.  We are closed weekends and major holidays. You have access to a nurse at all times for urgent questions. Please call the main number to the clinic Dept: [phone] and follow the prompts.   For any non-urgent questions, you may also contact your provider using MyChart. We now offer e-Visits for anyone 54 and older to request care online for non-urgent symptoms. For details visit [email or web address].   Also download the MyChart app! Go to the app store, search "MyChart", open the app, select [facility], and log in with your MyChart username and password.  Due to Covid, a mask is required upon entering the hospital/clinic. If you do not have a mask, one will be given to you upon arrival. For doctor visits, patients may have 1 support person aged 59 or older with them. For treatment visits, patients cannot have anyone with them due to current Covid guidelines and our immunocompromised population.   Cyclophosphamide Injection What is this medication? CYCLOPHOSPHAMIDE (sye kloe FOSS fa mide) is a chemotherapy drug. It slows the growth of cancer cells. This medicine is used to treat many types of cancer like lymphoma, myeloma, leukemia, breast cancer, and ovarian cancer, to name a few. This  medicine may be used for other purposes; ask your health care provider or pharmacist if you have questions. COMMON BRAND NAME(S): Cytoxan, Neosar What should I tell my care team before I take this medication? They need to know if you have any of these conditions: heart disease history of  irregular heartbeat infection kidney disease liver disease low blood counts, like white cells, platelets, or red blood cells on hemodialysis recent or ongoing radiation therapy scarring or thickening of the lungs trouble passing urine an unusual or allergic reaction to cyclophosphamide, other medicines, foods, dyes, or preservatives pregnant or trying to get pregnant breast-feeding How should I use this medication? This drug is usually given as an injection into a vein or muscle or by infusion into a vein. It is administered in a hospital or clinic by a specially trained health care professional. Talk to your pediatrician regarding the use of this medicine in children. Special care may be needed. Overdosage: If you think you have taken too much of this medicine contact a poison control center or emergency room at once. NOTE: This medicine is only for you. Do not share this medicine with others. What if I miss a dose? It is important not to miss your dose. Call your doctor or health care professional if you are unable to keep an appointment. What may interact with this medication? amphotericin B azathioprine certain antivirals for HIV or hepatitis certain medicines for blood pressure, heart disease, irregular heart beat certain medicines that treat or prevent blood clots like warfarin certain other medicines for cancer cyclosporine etanercept indomethacin medicines that relax muscles for surgery medicines to increase blood counts metronidazole This list may not describe all possible interactions. Give your health care provider a list of all the medicines, herbs, non-prescription drugs, or dietary supplements you use. Also tell them if you smoke, drink alcohol, or use illegal drugs. Some items may interact with your medicine. What should I watch for while using this medication? Your condition will be monitored carefully while you are receiving this medicine. You may need blood work  done while you are taking this medicine. Drink water or other fluids as directed. Urinate often, even at night. Some products may contain alcohol. Ask your health care professional if this medicine contains alcohol. Be sure to tell all health care professionals you are taking this medicine. Certain medicines, like metronidazole and disulfiram, can cause an unpleasant reaction when taken with alcohol. The reaction includes flushing, headache, nausea, vomiting, sweating, and increased thirst. The reaction can last from 30 minutes to several hours. Do not become pregnant while taking this medicine or for 1 year after stopping it. Women should inform their health care professional if they wish to become pregnant or think they might be pregnant. Men should not father a child while taking this medicine and for 4 months after stopping it. There is potential for serious side effects to an unborn child. Talk to your health care professional for more information. Do not breast-feed an infant while taking this medicine or for 1 week after stopping it. This medicine has caused ovarian failure in some women. This medicine may make it more difficult to get pregnant. Talk to your health care professional if you are concerned about your fertility. This medicine has caused decreased sperm counts in some men. This may make it more difficult to father a child. Talk to your health care professional if you are concerned about your fertility. Call your health care professional for advice if you get  a fever, chills, or sore throat, or other symptoms of a cold or flu. Do not treat yourself. This medicine decreases your body's ability to fight infections. Try to avoid being around people who are sick. Avoid taking medicines that contain aspirin, acetaminophen, ibuprofen, naproxen, or ketoprofen unless instructed by your health care professional. These medicines may hide a fever. Talk to your health care professional about your risk  of cancer. You may be more at risk for certain types of cancer if you take this medicine. If you are going to need surgery or other procedure, tell your health care professional that you are using this medicine. Be careful brushing or flossing your teeth or using a toothpick because you may get an infection or bleed more easily. If you have any dental work done, tell your dentist you are receiving this medicine. What side effects may I notice from receiving this medication? Side effects that you should report to your doctor or health care professional as soon as possible: allergic reactions like skin rash, itching or hives, swelling of the face, lips, or tongue breathing problems nausea, vomiting signs and symptoms of bleeding such as bloody or black, tarry stools; red or dark brown urine; spitting up blood or brown material that looks like coffee grounds; red spots on the skin; unusual bruising or bleeding from the eyes, gums, or nose signs and symptoms of heart failure like fast, irregular heartbeat, sudden weight gain; swelling of the ankles, feet, hands signs and symptoms of infection like fever; chills; cough; sore throat; pain or trouble passing urine signs and symptoms of kidney injury like trouble passing urine or change in the amount of urine signs and symptoms of liver injury like dark yellow or brown urine; general ill feeling or flu-like symptoms; light-colored stools; loss of appetite; nausea; right upper belly pain; unusually weak or tired; yellowing of the eyes or skin Side effects that usually do not require medical attention (report to your doctor or health care professional if they continue or are bothersome): confusion decreased hearing diarrhea facial flushing hair loss headache loss of appetite missed menstrual periods signs and symptoms of low red blood cells or anemia such as unusually weak or tired; feeling faint or lightheaded; falls skin discoloration This list may  not describe all possible side effects. Call your doctor for medical advice about side effects. You may report side effects to FDA at 1-800-FDA-1088. Where should I keep my medication? This drug is given in a hospital or clinic and will not be stored at home. NOTE: This sheet is a summary. It may not cover all possible information. If you have questions about this medicine, talk to your doctor, pharmacist, or health care provider.  2022 Elsevier/Gold Standard (2021-06-27 00:00:00)  Doxorubicin injection What is this medication? DOXORUBICIN (dox oh ROO bi sin) is a chemotherapy drug. It is used to treat many kinds of cancer like leukemia, lymphoma, neuroblastoma, sarcoma, and Wilms' tumor. It is also used to treat bladder cancer, breast cancer, lung cancer, ovarian cancer, stomach cancer, and thyroid cancer. This medicine may be used for other purposes; ask your health care provider or pharmacist if you have questions. COMMON BRAND NAME(S): Adriamycin, Adriamycin PFS, Adriamycin RDF, Rubex What should I tell my care team before I take this medication? They need to know if you have any of these conditions: heart disease history of low blood counts caused by a medicine liver disease recent or ongoing radiation therapy an unusual or allergic reaction to doxorubicin, other  chemotherapy agents, other medicines, foods, dyes, or preservatives pregnant or trying to get pregnant breast-feeding How should I use this medication? This drug is given as an infusion into a vein. It is administered in a hospital or clinic by a specially trained health care professional. If you have pain, swelling, burning or any unusual feeling around the site of your injection, tell your health care professional right away. Talk to your pediatrician regarding the use of this medicine in children. Special care may be needed. Overdosage: If you think you have taken too much of this medicine contact a poison control center or  emergency room at once. NOTE: This medicine is only for you. Do not share this medicine with others. What if I miss a dose? It is important not to miss your dose. Call your doctor or health care professional if you are unable to keep an appointment. What may interact with this medication? This medicine may interact with the following medications: 6-mercaptopurine paclitaxel phenytoin St. John's Wort trastuzumab verapamil This list may not describe all possible interactions. Give your health care provider a list of all the medicines, herbs, non-prescription drugs, or dietary supplements you use. Also tell them if you smoke, drink alcohol, or use illegal drugs. Some items may interact with your medicine. What should I watch for while using this medication? This drug may make you feel generally unwell. This is not uncommon, as chemotherapy can affect healthy cells as well as cancer cells. Report any side effects. Continue your course of treatment even though you feel ill unless your doctor tells you to stop. There is a maximum amount of this medicine you should receive throughout your life. The amount depends on the medical condition being treated and your overall health. Your doctor will watch how much of this medicine you receive in your lifetime. Tell your doctor if you have taken this medicine before. You may need blood work done while you are taking this medicine. Your urine may turn red for a few days after your dose. This is not blood. If your urine is dark or brown, call your doctor. In some cases, you may be given additional medicines to help with side effects. Follow all directions for their use. Call your doctor or health care professional for advice if you get a fever, chills or sore throat, or other symptoms of a cold or flu. Do not treat yourself. This drug decreases your body's ability to fight infections. Try to avoid being around people who are sick. This medicine may increase your  risk to bruise or bleed. Call your doctor or health care professional if you notice any unusual bleeding. Talk to your doctor about your risk of cancer. You may be more at risk for certain types of cancers if you take this medicine. Do not become pregnant while taking this medicine or for 6 months after stopping it. Women should inform their doctor if they wish to become pregnant or think they might be pregnant. Men should not father a child while taking this medicine and for 6 months after stopping it. There is a potential for serious side effects to an unborn child. Talk to your health care professional or pharmacist for more information. Do not breast-feed an infant while taking this medicine. This medicine has caused ovarian failure in some women and reduced sperm counts in some men This medicine may interfere with the ability to have a child. Talk with your doctor or health care professional if you are concerned about  your fertility. This medicine may cause a decrease in Co-Enzyme Q-10. You should make sure that you get enough Co-Enzyme Q-10 while you are taking this medicine. Discuss the foods you eat and the vitamins you take with your health care professional. What side effects may I notice from receiving this medication? Side effects that you should report to your doctor or health care professional as soon as possible: allergic reactions like skin rash, itching or hives, swelling of the face, lips, or tongue breathing problems chest pain fast or irregular heartbeat low blood counts - this medicine may decrease the number of white blood cells, red blood cells and platelets. You may be at increased risk for infections and bleeding. pain, redness, or irritation at site where injected signs of infection - fever or chills, cough, sore throat, pain or difficulty passing urine signs of decreased platelets or bleeding - bruising, pinpoint red spots on the skin, black, tarry stools, blood in the  urine swelling of the ankles, feet, hands tiredness weakness Side effects that usually do not require medical attention (report to your doctor or health care professional if they continue or are bothersome): diarrhea hair loss mouth sores nail discoloration or damage nausea red colored urine vomiting This list may not describe all possible side effects. Call your doctor for medical advice about side effects. You may report side effects to FDA at 1-800-FDA-1088. Where should I keep my medication? This drug is given in a hospital or clinic and will not be stored at home. NOTE: This sheet is a summary. It may not cover all possible information. If you have questions about this medicine, talk to your doctor, pharmacist, or health care provider.  2022 Elsevier/Gold Standard (2017-06-13 00:00:00)

## 2021-11-28 NOTE — Research (Signed)
Trial Name:  [id number] - TREATMENT OF REFRACTORY NAUSEA  Patient Cassandra Brennan was identified by Dr Burr Medico as a potential candidate for the above listed study.  This Clinical Research Nurse met with Cassandra Brennan, [id number] on 11/28/21 in a manner and location that ensures patient privacy to discuss participation in the above listed research study.  Patient is Unaccompanied.  Patient was previously provided with informed consent documents.  Patient confirmed they have read the informed consent documents.  As outlined in the informed consent form, this Nurse and Cassandra Brennan discussed the purpose of the research study, the investigational nature of the study, study procedures and requirements for study participation, potential risks and benefits of study participation, as well as alternatives to participation.  This study is blinded or double-blinded. The patient understands participation is voluntary and they may withdraw from study participation at any time.  Each study arm was reviewed, and randomization discussed.  Potential side effects were reviewed with patient as outlined in the consent form, and patient made aware there may be side effects not yet known. The chance of receiving placebo was discussed. Patient understands enrollment is pending full eligibility review.   Confidentiality and how the patient's information will be used as part of study participation were discussed.  Patient was informed there is not reimbursement provided for their time and effort spent on trial participation.  The patient is encouraged to discuss research study participation with their insurance provider to determine what costs they may incur as part of study participation, including research related injury.    All questions were answered to patient's satisfaction.  The informed consent and separate HIPAA Authorization was reviewed page by page.  The patient's mental and emotional status is appropriate to provide  informed consent, and the patient verbalizes an understanding of study participation.  Patient has agreed to participate in the above listed research study and has voluntarily signed the informed consent version dated 10/25/21 and separate HIPAA Authorization, version dated 01/07/17  on 11/28/21 at 1115AM.  The patient was provided with a copy of the signed informed consent form and separate HIPAA Authorization for their reference.  No study specific procedures were obtained prior to the signing of the informed consent document.  Approximately 20 minutes were spent with the patient reviewing the informed consent documents.  Patient was not requested to complete a Release of Information form.   During meeting with patient to obtain consents, confirmed that she is post-menopausal, no diabetes diagnosis, reads/writes/understands English, no dementia diagnosis. Reviewed exclusion and inclusion criteria with Cassandra Brennan, and she meets criteria for enrollment.  Cassandra Skiff Dorisann Schwanke, RN, BSN, [facility] She   Her   [facility] Clinical Research Nurse [facility] [phone]   Pager [phone] 11/28/2021 12:38 PM

## 2021-11-29 ENCOUNTER — Telehealth: Payer: Self-pay

## 2021-11-29 ENCOUNTER — Encounter: Payer: Self-pay | Admitting: *Deleted

## 2021-11-29 ENCOUNTER — Ambulatory Visit: Payer: Managed Care, Other (non HMO)

## 2021-11-29 ENCOUNTER — Ambulatory Visit: Payer: Managed Care, Other (non HMO) | Admitting: Hematology

## 2021-11-29 ENCOUNTER — Other Ambulatory Visit: Payer: Managed Care, Other (non HMO)

## 2021-11-29 ENCOUNTER — Encounter: Payer: Self-pay | Admitting: Hematology

## 2021-11-29 NOTE — Telephone Encounter (Signed)
Cassandra Brennan states that she is doing well. No N/V. She is eating, drinking and urinating well. She briefly did see the pick in her urine from the adriamycin.  She is getting in 64 oz of fluid. She knows to call the office at [phone] if she has any questions or concerns.

## 2021-11-29 NOTE — Telephone Encounter (Signed)
-----   Message from Charleston Poot, RN sent at 11/28/2021  4:05 PM EST ----- Regarding: First time / Adriamycin and Cytoxan  / Dr Burr Medico pt Hello,  Pt had first time [facility] today. Pt tolerated treatment well.   Thanks! Libbie K

## 2021-11-29 NOTE — Progress Notes (Signed)
Called pt to introduce myself as her Arboriculturist, discuss copay assistance and the [employer].  Pt gave me consent to apply in her behalf so I completed the online app for [employer].  I will notify her of the outcome once I receive it.  I also informed her of the [employer] and went over what it covers.  Pt would like to apply and will provide her proof of income on 12/04/21. If approved I will give her an expense sheet and my card for any questions or concerns she may have in the future.

## 2021-11-30 ENCOUNTER — Encounter: Payer: Self-pay | Admitting: Hematology

## 2021-11-30 ENCOUNTER — Telehealth: Payer: Self-pay | Admitting: Genetic Counselor

## 2021-11-30 ENCOUNTER — Inpatient Hospital Stay: Payer: Commercial Managed Care - HMO

## 2021-11-30 ENCOUNTER — Other Ambulatory Visit: Payer: Self-pay

## 2021-11-30 ENCOUNTER — Encounter: Payer: Self-pay | Admitting: Genetic Counselor

## 2021-11-30 VITALS — BP 138/73 | HR 68 | Temp 98.1°F | Resp 20

## 2021-11-30 DIAGNOSIS — Z1379 Encounter for other screening for genetic and chromosomal anomalies: Secondary | ICD-10-CM | POA: Insufficient documentation

## 2021-11-30 DIAGNOSIS — Z5111 Encounter for antineoplastic chemotherapy: Secondary | ICD-10-CM | POA: Diagnosis not present

## 2021-11-30 DIAGNOSIS — C50212 Malignant neoplasm of upper-inner quadrant of left female breast: Secondary | ICD-10-CM

## 2021-11-30 MED ORDER — PEGFILGRASTIM-BMEZ 6 MG/0.6ML ~~LOC~~ SOSY
6.0000 mg | PREFILLED_SYRINGE | Freq: Once | SUBCUTANEOUS | Status: AC
  Administered 2021-11-30: 6 mg via SUBCUTANEOUS
  Filled 2021-11-30: qty 0.6

## 2021-11-30 MED ORDER — PEGFILGRASTIM-CBQV 6 MG/0.6ML ~~LOC~~ SOSY: 6.0000 mg | PREFILLED_SYRINGE | Freq: Once | SUBCUTANEOUS | Status: DC

## 2021-11-30 NOTE — Patient Instructions (Addendum)

## 2021-11-30 NOTE — Progress Notes (Signed)
Pt is enrolled in the [employer] program for [employer] for $15,000 for 12 months from 11/29/21.  Pt is eligible to have $0 OOP costs for each [employer].

## 2021-11-30 NOTE — Telephone Encounter (Signed)
I contacted [name] to discuss her genetic testing results. No pathogenic variants were identified in the 36 genes analyzed. Detailed clinic note to follow.  The test report has been scanned into EPIC and is located under the Molecular Pathology section of the Results Review tab.  A portion of the result report is included below for reference.   Cassandra Passy, MS, [facility] Genetic Counselor [email or web address] (P) [phone]

## 2021-12-01 ENCOUNTER — Ambulatory Visit: Payer: Managed Care, Other (non HMO)

## 2021-12-01 DIAGNOSIS — Z170 Estrogen receptor positive status [ER+]: Secondary | ICD-10-CM

## 2021-12-01 DIAGNOSIS — C50212 Malignant neoplasm of upper-inner quadrant of left female breast: Secondary | ICD-10-CM

## 2021-12-01 NOTE — Research (Signed)
[id number] - TREATMENT OF REFRACTORY NAUSEA  Called Ms Cassandra Brennan for her C1D4 questions. She reports no nausea rated 3 or higher and no vomiting. She had some nausea this morning that resolved with Zofran. Due to her low level of nausea, she is not eligible to continue on study.   We discussed BRAT diet and trying cool or cold foods. She verbalized understanding and had good success with eating a banana.  Reminded her to mail in her Cycle 1 forms using the SASE provided to her Tuesday. Confirmed that she has the contact information for [facility] and that she knows to call us with any questions.  Marjie Skiff Tangee Marszalek, RN, BSN, [facility] She   Her   Hers Clinical Research Nurse [facility] Direct Dial [phone]   Pager [phone] 12/01/2021 10:11 AM

## 2021-12-01 NOTE — Progress Notes (Signed)
[place]   [phone] [phone]   Clinic Follow up Note   Patient Care Team: Everardo Beals, NP as PCP - General Erroll Luna, MD as Consulting Physician (General Surgery) Truitt Merle, MD as Consulting Physician (Hematology) Gery Pray, MD as Consulting Physician (Radiation Oncology) [name], RN as Oncology Nurse Navigator [name], RN as Oncology Nurse Navigator 12/04/2021  CHIEF COMPLAINT: Chemo follow up/toxicity check  SUMMARY OF ONCOLOGIC HISTORY: Oncology History Overview Note   Cancer Staging  Malignant neoplasm of upper-inner quadrant of left breast in female, estrogen receptor positive ([place]) Staging form: Breast, AJCC 8th Edition - Clinical stage from 10/27/2021: Stage IIB (cT1c, cN1, cM0, G3, ER+, PR-, HER2-) - Signed by Truitt Merle, MD on 11/07/2021    Malignant neoplasm of upper-inner quadrant of left breast in female, estrogen receptor positive ([place])  10/19/2021 Mammogram   CLINICAL DATA:  Patient presents for palpable left axillary abnormality.   EXAM: DIGITAL DIAGNOSTIC BILATERAL MAMMOGRAM WITH TOMOSYNTHESIS AND CAD; ULTRASOUND LEFT BREAST LIMITED  IMPRESSION: Suspicious left breast mass 10 o'clock position.   Adjacent suspicious satellite nodule left breast 12 o'clock position.   Palpable mass left axilla corresponds with a large lymph node.   10/27/2021 Initial Biopsy   Diagnosis 1. Breast, left, needle core biopsy, 12 o'clock, ribbon - FIBROADENOMA - NO MALIGNANCY IDENTIFIED 2. Breast, left, needle core biopsy, 10 o'clock, coil - INVASIVE DUCTAL CARCINOMA WITH NECROSIS - DUCTAL CARCINOMA IN SITU - SEE COMMENT 3. Lymph node, needle/core biopsy, left axilla, tribell - INVASIVE DUCTAL CARCINOMA WITH NECROSIS - NO NODAL TISSUE IDENTIFIED - SEE COMMENT Microscopic Comment 2. and 2. Based on the biopsy, the carcinoma appears Nottingham grade 3 of 3 and measures 0.8 cm in greatest  linear extent.  3. PROGNOSTIC INDICATORS Results: The tumor cells are NEGATIVE for Her2 (1+). Estrogen Receptor: 30%, POSITIVE, WEAK STAINING INTENSITY Progesterone Receptor: 0%, NEGATIVE Proliferation Marker Ki67: 60%   10/27/2021 Cancer Staging   Staging form: Breast, AJCC 8th Edition - Clinical stage from 10/27/2021: Stage IIB (cT1c, cN1, cM0, G3, ER+, PR-, HER2-) - Signed by [name], NP on 11/28/2021 Stage prefix: Initial diagnosis Histologic grading system: 3 grade system    11/06/2021 Initial Diagnosis   Malignant neoplasm of upper-inner quadrant of left breast in female, estrogen receptor positive ([place])   11/15/2021 Breast MRI   IMPRESSION: 1.9 cm mass in the upper-inner quadrant of the left breast corresponding with the known invasive ductal carcinoma. Enlarged left axillary lymph node corresponding with known metastatic disease.   RECOMMENDATION: Treatment planning of the known left breast cancer and axillary metastasis is recommended.   Additional imaging evaluation of the mass in the liver is recommended with CT with liver mass protocol or MRI.   11/22/2021 Imaging   Bone scan IMPRESSION: No scintigraphic evidence of bony metastatic disease. Degenerative changes as above.   11/22/2021 Echocardiogram   Baseline echo LVEF 55-60%, normal GLS -21.7%   11/23/2021 Imaging   CT CAP IMPRESSION: 1. Bulky lymphadenopathy in the left axilla including a 5.0 x 2.8 cm lymph node. 2. 15 mm nodule identified in the upper inner quadrant of the left breast. 3. No suspicious pulmonary nodule or mass. No evidence for metastatic disease in the abdomen or pelvis. 4. 11 mm cyst in the dome of the left liver. Adjacent tiny hypoattenuating lesions in the right liver are too small to characterize, but likely benign. Attention on follow-up recommended. 5. Prominent stool volume raises the question of  clinical constipation.   11/28/2021 -  Chemotherapy   Patient is on Treatment Plan : BREAST  ADJUVANT DOSE DENSE AC q14d / PACLitaxel q7d      Genetic Testing   [employer] CancerNext was Negative. Report date is 11/26/2021.   The CancerNext gene panel offered by [employer] includes sequencing, rearrangement analysis, and RNA analysis for the following 36 genes:   APC, ATM, AXIN2, BARD1, BMPR1A, BRCA1, BRCA2, BRIP1, CDH1, CDK4, CDKN2A, CHEK2, DICER1, HOXB13, EPCAM, GREM1, MLH1, MSH2, MSH3, MSH6, MUTYH, NBN, NF1, NTHL1, PALB2, PMS2, POLD1, POLE, PTEN, RAD51C, RAD51D, RECQL, SMAD4, SMARCA4, STK11, and TP53.      CURRENT THERAPY: neoadjuvant AC-T starting 11/28/21  INTERVAL HISTORY: Cassandra Brennan returns for follow up as scheduled. She began first cycle [facility] 2/7 and GCSF 2/9.  She woke up feeling nauseous on day 2, no vomiting, resolved with Zofran.  She has mild constipation but bowels moving.  This morning she noticed a red area near the left breast areola, not itching or painful.  Denies other rash.  Denies bone pain while taking Claritin and extra strength Tylenol prophylactically.  She is tired, started walking on her treadmill yesterday which has helped.  She experienced no other side effects such as fever, chills, mucositis, cough, chest pain, dyspnea, leg edema.   MEDICAL HISTORY:  Past Medical History:  Diagnosis Date   Anxiety    Arthritis    right knee    GERD (gastroesophageal reflux disease)    Hyperlipidemia    Hypertension    Positive colorectal cancer screening using Cologuard test     SURGICAL HISTORY: Past Surgical History:  Procedure Laterality Date   BREAST CYST EXCISION Right    pt stated in [place]     other GI testing     unsure but had to drink a chalky drink    PORTACATH PLACEMENT N/A 11/27/2021   Procedure: INSERTION PORT-A-CATH;  Surgeon: Erroll Luna, MD;  Location: [facility];  Service: General;  Laterality: N/A;   TONSILLECTOMY     TUBAL LIGATION      I have reviewed the social history and family history with the patient and they are  unchanged from previous note.  ALLERGIES:  is allergic to prednisone, rosuvastatin, other, robitussin (alcohol free) [guaifenesin], and sulfa antibiotics.  MEDICATIONS:  Current Outpatient Medications  Medication Sig Dispense Refill   ALPRAZolam (XANAX) 0.5 MG tablet Take 0.5 mg by mouth at bedtime as needed for anxiety.     ibuprofen (ADVIL) 800 MG tablet Take 1 tablet (800 mg total) by mouth every 8 (eight) hours as needed. 30 tablet 0   lidocaine-prilocaine (EMLA) cream Apply to affected area once 30 g 3   metoprolol succinate (TOPROL-XL) 100 MG 24 hr tablet Take 1 tablet (100 mg total) by mouth daily. Take with or immediately following a meal. 30 tablet 0   ondansetron (ZOFRAN) 8 MG tablet Take 1 tablet (8 mg total) by mouth 2 (two) times daily as needed. Start on the third day after chemotherapy. 30 tablet 1   oxyCODONE (OXY IR/ROXICODONE) 5 MG immediate release tablet Take 1 tablet (5 mg total) by mouth every 6 (six) hours as needed for severe pain. 15 tablet 0   pravastatin (PRAVACHOL) 20 MG tablet Take 1 tablet (20 mg total) by mouth daily. 30 tablet 0   prochlorperazine (COMPAZINE) 10 MG tablet Take 1 tablet (10 mg total) by mouth every 6 (six) hours as needed (Nausea or vomiting). 30 tablet 1  No current facility-administered medications for this visit.    PHYSICAL EXAMINATION: ECOG PERFORMANCE STATUS: 1 - Symptomatic but completely ambulatory  Vitals:   12/04/21 1043  BP: (!) 149/78  Pulse: 97  Resp: 18  Temp: 98.7 F (37.1 C)  SpO2: 100%   Filed Weights   12/04/21 1043  Weight: 183 lb 2 oz (83.1 kg)    GENERAL:alert, no distress and comfortable SKIN: No rash EYES: sclera clear LYMPH: Palpable left axillary lymph node LUNGS:  normal breathing effort HEART: no lower extremity edema NEURO: alert & oriented x 3 with fluent speech, no focal motor deficits Breast exam: Inspection of left breast reveals mild erythema at the upper inner border of areola PAC site  healing well, incision closed.  There is mild swelling and ecchymosis overlying the port without erythema or drainage  LABORATORY DATA:  I have reviewed the data as listed CBC Latest Ref Rng & Units 12/04/2021 11/28/2021 11/08/2021  WBC 4.0 - 10.5 K/uL 8.0 12.0(H) 6.3  Hemoglobin 12.0 - 15.0 g/dL 10.9(L) 11.6(L) 12.0  Hematocrit 36.0 - 46.0 % 31.4(L) 34.4(L) 35.7(L)  Platelets 150 - 400 K/uL 112(L) 241 282     CMP Latest Ref Rng & Units 12/04/2021 11/28/2021 11/08/2021  Glucose 70 - 99 mg/dL 126(H) 104(H) 134(H)  BUN 8 - 23 mg/dL '11 12 15  ' Creatinine 0.44 - 1.00 mg/dL 0.73 0.76 0.87  Sodium 135 - 145 mmol/L 140 141 140  Potassium 3.5 - 5.1 mmol/L 3.8 3.5 3.6  Chloride 98 - 111 mmol/L 107 107 106  CO2 22 - 32 mmol/L '28 27 26  ' Calcium 8.9 - 10.3 mg/dL 8.9 9.6 9.6  Total Protein 6.5 - 8.1 g/dL 6.8 7.3 7.6  Total Bilirubin 0.3 - 1.2 mg/dL 0.4 0.3 0.4  Alkaline Phos 38 - 126 U/L 122 90 100  AST 15 - 41 U/L 11(L) 13(L) 14(L)  ALT 0 - 44 U/L '13 21 25      ' RADIOGRAPHIC STUDIES: I have personally reviewed the radiological images as listed and agreed with the findings in the report. No results found.   ASSESSMENT & PLAN: Cassandra Brennan is a 66 y.o. postmenopausal female    1. 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 B/l diagnostic MM and left [place] 10/19/21 showed: 2 cm mass at 10 o'clock; 5 mm indeterminate satellite mass at 12 o'clock; palpable left axilla mass corresponds with large lymph node. Biopsy 10/27/21 confirmed invasive ductal carcinoma with necrosis to both 10 o'clock and lymph node, grade 3. ER weakly positive (30%). 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  Neoadjuvant chemotherapy has been recommended.  She was seen by Dr. Brantley Stage and will  likely proceed with lumpectomy, SLNB and targeted lymph node dissection following chemo.  She would benefit from adjuvant radiation if she has lumpectomy. -breast MRI 11/15/2021 was previously reviewed, which shows the biopsy proven malignancy in the upper inner left breat measuring 1.5 x1.3 x1.9 cm, a biopsy proven malignant 4.3 cm left axillary LN and 2 other abnormal LNs measuring 1.3 and 1.5 cm, and a 1.3 liver mass likely representing a cyst.  -CT CAP 11/23/2021 was previously reviewed, which shows known left breast cancer and bulky axillary adenopathy, a liver cyst, and tiny indeterminate liver nodules too small to characterize. We will  monitor these in the future. No evidence of distant metastasis.  -Bone scan is negative for osseous metastasis.  -Baseline echo is normal, LVEF 55-60%. -Left breast mass in the upper inner quadrant measured 2 cm and 2 palpable lymph nodes largest measuring 4.5 cm on 2/7 the day she began neoadjuvant chemo, for reference -She began neoadjuvant AC with cycle 09/27/22, tolerated well with mild fatigue and nausea.     2.  Hypertension and anxiety -Continue medications. -Follow-up with primary care physician -Improved today and patient had not taken antihypertensives    Disposition: Ms. [place] appears stable.  Currently C1D7 AC with G-CSF, she tolerated well with mild nausea and fatigue.  Side effects were adequately managed with supportive care at home.  She is able to recover and function well.  There is no indication for supportive care intervention today.   She can continue walking for exercise. Ok to try alkaline water and start women's daily multivitamin.   Labs reviewed, mild thrombocytopenia plt 112K and anemia 10.9, otherwise unremarkable.  She will return for lab, follow-up with Dr. Burr Medico, and cycle 2 on 12/11/2021 as scheduled.  She will monitor the left breast erythema. She knows to call sooner with any new changes or concerns.   All questions were  answered. The patient knows to call the clinic with any problems, questions or concerns. No barriers to learning were detected.      [name], NP 12/04/21

## 2021-12-04 ENCOUNTER — Inpatient Hospital Stay: Payer: Commercial Managed Care - HMO

## 2021-12-04 ENCOUNTER — Encounter: Payer: Self-pay | Admitting: Nurse Practitioner

## 2021-12-04 ENCOUNTER — Ambulatory Visit: Payer: Self-pay | Admitting: Genetic Counselor

## 2021-12-04 ENCOUNTER — Encounter: Payer: Self-pay | Admitting: *Deleted

## 2021-12-04 ENCOUNTER — Other Ambulatory Visit: Payer: Self-pay

## 2021-12-04 ENCOUNTER — Encounter: Payer: Self-pay | Admitting: Hematology

## 2021-12-04 ENCOUNTER — Inpatient Hospital Stay (HOSPITAL_BASED_OUTPATIENT_CLINIC_OR_DEPARTMENT_OTHER): Payer: Commercial Managed Care - HMO | Admitting: Nurse Practitioner

## 2021-12-04 VITALS — BP 149/78 | HR 97 | Temp 98.7°F | Resp 18 | Wt 183.1 lb

## 2021-12-04 DIAGNOSIS — C50212 Malignant neoplasm of upper-inner quadrant of left female breast: Secondary | ICD-10-CM

## 2021-12-04 DIAGNOSIS — Z5111 Encounter for antineoplastic chemotherapy: Secondary | ICD-10-CM | POA: Diagnosis not present

## 2021-12-04 DIAGNOSIS — Z170 Estrogen receptor positive status [ER+]: Secondary | ICD-10-CM

## 2021-12-04 DIAGNOSIS — Z95828 Presence of other vascular implants and grafts: Secondary | ICD-10-CM

## 2021-12-04 LAB — CBC WITH DIFFERENTIAL (CANCER CENTER ONLY)
Abs Immature Granulocytes: 0.1 10*3/uL — ABNORMAL HIGH (ref 0.00–0.07)
Band Neutrophils: 1 %
Basophils Absolute: 0.1 10*3/uL (ref 0.0–0.1)
Basophils Relative: 1 %
Eosinophils Absolute: 0.2 10*3/uL (ref 0.0–0.5)
Eosinophils Relative: 3 %
HCT: 31.4 % — ABNORMAL LOW (ref 36.0–46.0)
Hemoglobin: 10.9 g/dL — ABNORMAL LOW (ref 12.0–15.0)
Lymphocytes Relative: 14 %
Lymphs Abs: 1.1 10*3/uL (ref 0.7–4.0)
MCH: 28.7 pg (ref 26.0–34.0)
MCHC: 34.7 g/dL (ref 30.0–36.0)
MCV: 82.6 fL (ref 80.0–100.0)
Metamyelocytes Relative: 1 %
Monocytes Absolute: 0 10*3/uL — ABNORMAL LOW (ref 0.1–1.0)
Monocytes Relative: 0 %
Neutro Abs: 6.5 10*3/uL (ref 1.7–7.7)
Neutrophils Relative %: 80 %
Platelet Count: 112 10*3/uL — ABNORMAL LOW (ref 150–400)
RBC: 3.8 MIL/uL — ABNORMAL LOW (ref 3.87–5.11)
RDW: 14.5 % (ref 11.5–15.5)
WBC Count: 8 10*3/uL (ref 4.0–10.5)
nRBC: 0 % (ref 0.0–0.2)

## 2021-12-04 LAB — CMP (CANCER CENTER ONLY)
ALT: 13 U/L (ref 0–44)
AST: 11 U/L — ABNORMAL LOW (ref 15–41)
Albumin: 4 g/dL (ref 3.5–5.0)
Alkaline Phosphatase: 122 U/L (ref 38–126)
Anion gap: 5 (ref 5–15)
BUN: 11 mg/dL (ref 8–23)
CO2: 28 mmol/L (ref 22–32)
Calcium: 8.9 mg/dL (ref 8.9–10.3)
Chloride: 107 mmol/L (ref 98–111)
Creatinine: 0.73 mg/dL (ref 0.44–1.00)
GFR, Estimated: >60 mL/min (ref >60)
Glucose, Bld: 126 mg/dL — ABNORMAL HIGH (ref 70–99)
Potassium: 3.8 mmol/L (ref 3.5–5.1)
Sodium: 140 mmol/L (ref 135–145)
Total Bilirubin: 0.4 mg/dL (ref 0.3–1.2)
Total Protein: 6.8 g/dL (ref 6.5–8.1)

## 2021-12-04 MED ORDER — HEPARIN SOD (PORK) LOCK FLUSH 100 UNIT/ML IV SOLN: 500.0000 [IU] | Freq: Once | INTRAVENOUS | Status: DC

## 2021-12-04 MED ORDER — SODIUM CHLORIDE 0.9% FLUSH
10.0000 mL | Freq: Once | INTRAVENOUS | Status: AC
  Administered 2021-12-04: 10 mL

## 2021-12-04 NOTE — Progress Notes (Signed)
Pt is approved for the $1000 [employer] grant.  

## 2021-12-04 NOTE — Progress Notes (Signed)
HPI:   Cassandra Brennan was previously seen in the [place] clinic due to a personal and family history of cancer and concerns regarding a hereditary predisposition to cancer. Please refer to our prior cancer genetics clinic note for more information regarding our discussion, assessment and recommendations, at the time. Cassandra Brennan recent genetic test results were disclosed to her, as were recommendations warranted by these results. These results and recommendations are discussed in more detail below.  CANCER HISTORY:  Oncology History Overview Note   Cancer Staging  Malignant neoplasm of upper-inner quadrant of left breast in female, estrogen receptor positive ([place]) Staging form: Breast, AJCC 8th Edition - Clinical stage from 10/27/2021: Stage IIB (cT1c, cN1, cM0, G3, ER+, PR-, HER2-) - Signed by Truitt Merle, MD on 11/07/2021    Malignant neoplasm of upper-inner quadrant of left breast in female, estrogen receptor positive ([place])  10/19/2021 Mammogram   CLINICAL DATA:  Patient presents for palpable left axillary abnormality.   EXAM: DIGITAL DIAGNOSTIC BILATERAL MAMMOGRAM WITH TOMOSYNTHESIS AND CAD; ULTRASOUND LEFT BREAST LIMITED  IMPRESSION: Suspicious left breast mass 10 o'clock position.   Adjacent suspicious satellite nodule left breast 12 o'clock position.   Palpable mass left axilla corresponds with a large lymph node.   10/27/2021 Initial Biopsy   Diagnosis 1. Breast, left, needle core biopsy, 12 o'clock, ribbon - FIBROADENOMA - NO MALIGNANCY IDENTIFIED 2. Breast, left, needle core biopsy, 10 o'clock, coil - INVASIVE DUCTAL CARCINOMA WITH NECROSIS - DUCTAL CARCINOMA IN SITU - SEE COMMENT 3. Lymph node, needle/core biopsy, left axilla, tribell - INVASIVE DUCTAL CARCINOMA WITH NECROSIS - NO NODAL TISSUE IDENTIFIED - SEE COMMENT Microscopic Comment 2. and 2. Based on the biopsy, the carcinoma appears Nottingham grade 3 of 3 and measures 0.8 cm in greatest  linear extent.  3. PROGNOSTIC INDICATORS Results: The tumor cells are NEGATIVE for Her2 (1+). Estrogen Receptor: 30%, POSITIVE, WEAK STAINING INTENSITY Progesterone Receptor: 0%, NEGATIVE Proliferation Marker Ki67: 60%   10/27/2021 Cancer Staging   Staging form: Breast, AJCC 8th Edition - Clinical stage from 10/27/2021: Stage IIB (cT1c, cN1, cM0, G3, ER+, PR-, HER2-) - Signed by [name], NP on 11/28/2021 Stage prefix: Initial diagnosis Histologic grading system: 3 grade system    11/06/2021 Initial Diagnosis   Malignant neoplasm of upper-inner quadrant of left breast in female, estrogen receptor positive ([place])   11/15/2021 Breast MRI   IMPRESSION: 1.9 cm mass in the upper-inner quadrant of the left breast corresponding with the known invasive ductal carcinoma. Enlarged left axillary lymph node corresponding with known metastatic disease.   RECOMMENDATION: Treatment planning of the known left breast cancer and axillary metastasis is recommended.   Additional imaging evaluation of the mass in the liver is recommended with CT with liver mass protocol or MRI.   11/22/2021 Imaging   Bone scan IMPRESSION: No scintigraphic evidence of bony metastatic disease. Degenerative changes as above.   11/22/2021 Echocardiogram   Baseline echo LVEF 55-60%, normal GLS -21.7%   11/23/2021 Imaging   CT CAP IMPRESSION: 1. Bulky lymphadenopathy in the left axilla including a 5.0 x 2.8 cm lymph node. 2. 15 mm nodule identified in the upper inner quadrant of the left breast. 3. No suspicious pulmonary nodule or mass. No evidence for metastatic disease in the abdomen or pelvis. 4. 11 mm cyst in the dome of the left liver. Adjacent tiny hypoattenuating lesions in the right liver are too small to characterize, but likely benign. Attention on follow-up recommended. 5. Prominent stool volume  raises the question of clinical constipation.   11/28/2021 -  Chemotherapy   Patient is on Treatment Plan : BREAST  ADJUVANT DOSE DENSE AC q14d / PACLitaxel q7d      Genetic Testing   [employer] CancerNext was Negative. Report date is 11/26/2021.   The CancerNext gene panel offered by [employer] includes sequencing, rearrangement analysis, and RNA analysis for the following 36 genes:   APC, ATM, AXIN2, BARD1, BMPR1A, BRCA1, BRCA2, BRIP1, CDH1, CDK4, CDKN2A, CHEK2, DICER1, HOXB13, EPCAM, GREM1, MLH1, MSH2, MSH3, MSH6, MUTYH, NBN, NF1, NTHL1, PALB2, PMS2, POLD1, POLE, PTEN, RAD51C, RAD51D, RECQL, SMAD4, SMARCA4, STK11, and TP53.      FAMILY HISTORY:  We obtained a detailed, 4-generation family history.  Significant diagnoses are listed below:      Family History  Problem Relation Age of Onset   Colon polyps Sister     Colon polyps Sister     Colon polyps Sister     Colon cancer Maternal Uncle          dx. 80s   Throat cancer Maternal Uncle     Colon cancer Cousin          dx. >50   Gastric cancer Cousin          dx. >50   Breast cancer Cousin          dx. 22s   Kidney cancer Nephew     Esophageal cancer Neg Hx     Rectal cancer Neg Hx     Stomach cancer Neg Hx        Cassandra Brennan's nephew was diagnosed with kidney cancer in his 24s and is deceased. She has a maternal uncle who was diagnosed with colon cancer in his 60s and second maternal uncle diagnosed with throat cancer (he smoked), both are deceased. One maternal cousin was diagnosed with colon cancer at an unknown age (>50) and a second maternal cousin was diagnosed with stomach cancer at an unknown age (>50). She reports a paternal cousin diagnosed with breast cancer in her 68s. Of note, she has limited information about her paternal family medical history.    Cassandra Brennan is unaware of previous family history of genetic testing for hereditary cancer risks. There is no reported Ashkenazi Jewish ancestry.     GENETIC TEST RESULTS:  The [employer] CancerNext Panel found no pathogenic mutations.  The CancerNext gene panel offered by [employer]  includes sequencing, rearrangement analysis, and RNA analysis for the following 36 genes:   APC, ATM, AXIN2, BARD1, BMPR1A, BRCA1, BRCA2, BRIP1, CDH1, CDK4, CDKN2A, CHEK2, DICER1, HOXB13, EPCAM, GREM1, MLH1, MSH2, MSH3, MSH6, MUTYH, NBN, NF1, NTHL1, PALB2, PMS2, POLD1, POLE, PTEN, RAD51C, RAD51D, RECQL, SMAD4, SMARCA4, STK11, and TP53.     The test report has been scanned into EPIC and is located under the Molecular Pathology section of the Results Review tab.  A portion of the result report is included below for reference. Genetic testing reported out on 11/26/2021.        Even though a pathogenic variant was not identified, possible explanations for the cancer in the family may include: There may be no hereditary risk for cancer in the family. The cancers in Cassandra Brennan and/or her family may be due to other genetic or environmental factors. There may be a gene mutation in one of these genes that current testing methods cannot detect, but that chance is small. There could be another gene that has not yet been discovered, or that we have  not yet tested, that is responsible for the cancer diagnoses in the family.  It is also possible there is a hereditary cause for the cancer in the family that Cassandra Brennan did not inherit.  Therefore, it is important to remain in touch with cancer genetics in the future so that we can continue to offer Cassandra Brennan the most up to date genetic testing.   ADDITIONAL GENETIC TESTING:  We discussed with Cassandra Brennan that her genetic testing was fairly extensive.  If there are genes identified to increase cancer risk that can be analyzed in the future, we would be happy to discuss and coordinate this testing at that time.     CANCER SCREENING RECOMMENDATIONS:  Cassandra Brennan test result is considered negative (normal).  This means that we have not identified a hereditary cause for her personal and family history of cancer at this time. Most cancers happen by chance and this  negative test suggests that her cancer may fall into this category.    An individual's cancer risk and medical management are not determined by genetic test results alone. Overall cancer risk assessment incorporates additional factors, including personal medical history, family history, and any available genetic information that may result in a personalized plan for cancer prevention and surveillance. Therefore, it is recommended she continue to follow the cancer management and screening guidelines provided by her oncology and primary healthcare provider.  RECOMMENDATIONS FOR FAMILY MEMBERS:   Since she did not inherit a mutation in a cancer predisposition gene included on this panel, her children could not have inherited a mutation from her in one of these genes. Individuals in this family might be at some increased risk of developing cancer, over the general population risk, due to the family history of cancer. We recommend women in this family have a yearly mammogram beginning at age 46, or 34 years younger than the earliest onset of cancer, an annual clinical breast exam, and perform monthly breast self-exams.  FOLLOW-UP:  Cancer genetics is a rapidly advancing field and it is possible that new genetic tests will be appropriate for her and/or her family members in the future. We encouraged her to remain in contact with cancer genetics on an annual basis so we can update her personal and family histories and let her know of advances in cancer genetics that may benefit this family.   Our contact number was provided. Cassandra Brennan questions were answered to her satisfaction, and she knows she is welcome to call us at anytime with additional questions or concerns.   Lucille Passy, MS, [facility] Genetic Counselor [email or web address] (P) [phone]

## 2021-12-05 ENCOUNTER — Other Ambulatory Visit: Payer: Self-pay

## 2021-12-07 ENCOUNTER — Emergency Department (HOSPITAL_COMMUNITY): Payer: Commercial Managed Care - HMO

## 2021-12-07 ENCOUNTER — Encounter (HOSPITAL_COMMUNITY): Payer: Self-pay | Admitting: Emergency Medicine

## 2021-12-07 ENCOUNTER — Emergency Department (HOSPITAL_COMMUNITY)
Admission: EM | Admit: 2021-12-07 | Discharge: 2021-12-07 | Disposition: A | Discharge status: Home / Self Care | Payer: Commercial Managed Care - HMO | Attending: Emergency Medicine | Admitting: Emergency Medicine

## 2021-12-07 ENCOUNTER — Telehealth: Payer: Self-pay

## 2021-12-07 ENCOUNTER — Encounter (HOSPITAL_COMMUNITY): Payer: Self-pay | Admitting: *Deleted

## 2021-12-07 ENCOUNTER — Other Ambulatory Visit: Payer: Self-pay

## 2021-12-07 ENCOUNTER — Emergency Department (EMERGENCY_DEPARTMENT_HOSPITAL)
Admission: EM | Admit: 2021-12-07 | Discharge: 2021-12-08 | Disposition: A | Discharge status: Home / Self Care | Payer: Commercial Managed Care - HMO | Source: Home / Self Care | Attending: Emergency Medicine | Admitting: Emergency Medicine

## 2021-12-07 DIAGNOSIS — I10 Essential (primary) hypertension: Secondary | ICD-10-CM | POA: Insufficient documentation

## 2021-12-07 DIAGNOSIS — A419 Sepsis, unspecified organism: Secondary | ICD-10-CM | POA: Insufficient documentation

## 2021-12-07 DIAGNOSIS — D72829 Elevated white blood cell count, unspecified: Secondary | ICD-10-CM | POA: Insufficient documentation

## 2021-12-07 DIAGNOSIS — Z20822 Contact with and (suspected) exposure to covid-19: Secondary | ICD-10-CM | POA: Insufficient documentation

## 2021-12-07 DIAGNOSIS — F419 Anxiety disorder, unspecified: Secondary | ICD-10-CM | POA: Diagnosis not present

## 2021-12-07 DIAGNOSIS — R030 Elevated blood-pressure reading, without diagnosis of hypertension: Secondary | ICD-10-CM | POA: Insufficient documentation

## 2021-12-07 DIAGNOSIS — Z853 Personal history of malignant neoplasm of breast: Secondary | ICD-10-CM | POA: Insufficient documentation

## 2021-12-07 DIAGNOSIS — Z79899 Other long term (current) drug therapy: Secondary | ICD-10-CM | POA: Insufficient documentation

## 2021-12-07 DIAGNOSIS — M545 Low back pain, unspecified: Secondary | ICD-10-CM | POA: Diagnosis present

## 2021-12-07 LAB — URINALYSIS, ROUTINE W REFLEX MICROSCOPIC
Bilirubin Urine: NEGATIVE
Bilirubin Urine: NEGATIVE
Glucose, UA: NEGATIVE mg/dL
Glucose, UA: NEGATIVE mg/dL
Hgb urine dipstick: NEGATIVE
Hgb urine dipstick: NEGATIVE
Ketones, ur: NEGATIVE mg/dL
Ketones, ur: NEGATIVE mg/dL
Nitrite: NEGATIVE
Nitrite: NEGATIVE
Protein, ur: NEGATIVE mg/dL
Protein, ur: NEGATIVE mg/dL
Specific Gravity, Urine: 1.009 (ref 1.005–1.030)
Specific Gravity, Urine: 1.006 (ref 1.005–1.030)
pH: 5 (ref 5.0–8.0)
pH: 6 (ref 5.0–8.0)

## 2021-12-07 LAB — COMPREHENSIVE METABOLIC PANEL
ALT: 26 U/L (ref 0–44)
AST: 20 U/L (ref 15–41)
Albumin: 4.1 g/dL (ref 3.5–5.0)
Alkaline Phosphatase: 105 U/L (ref 38–126)
Anion gap: 7 (ref 5–15)
BUN: 17 mg/dL (ref 8–23)
CO2: 26 mmol/L (ref 22–32)
Calcium: 9.4 mg/dL (ref 8.9–10.3)
Chloride: 104 mmol/L (ref 98–111)
Creatinine, Ser: 0.81 mg/dL (ref 0.44–1.00)
GFR, Estimated: >60 mL/min (ref >60)
Glucose, Bld: 132 mg/dL — ABNORMAL HIGH (ref 70–99)
Potassium: 3.8 mmol/L (ref 3.5–5.1)
Sodium: 137 mmol/L (ref 135–145)
Total Bilirubin: 0.6 mg/dL (ref 0.3–1.2)
Total Protein: 7.7 g/dL (ref 6.5–8.1)

## 2021-12-07 LAB — CBC WITH DIFFERENTIAL/PLATELET
Abs Immature Granulocytes: 0.4 10*3/uL — ABNORMAL HIGH (ref 0.00–0.07)
Basophils Absolute: 0.1 10*3/uL (ref 0.0–0.1)
Basophils Relative: 2 %
Eosinophils Absolute: 0.2 10*3/uL (ref 0.0–0.5)
Eosinophils Relative: 3 %
HCT: 32.8 % — ABNORMAL LOW (ref 36.0–46.0)
Hemoglobin: 10.9 g/dL — ABNORMAL LOW (ref 12.0–15.0)
Lymphocytes Relative: 18 %
Lymphs Abs: 1 10*3/uL (ref 0.7–4.0)
MCH: 28.3 pg (ref 26.0–34.0)
MCHC: 33.2 g/dL (ref 30.0–36.0)
MCV: 85.2 fL (ref 80.0–100.0)
Metamyelocytes Relative: 1 %
Monocytes Absolute: 0.3 10*3/uL (ref 0.1–1.0)
Monocytes Relative: 5 %
Myelocytes: 6 %
Neutro Abs: 3.6 10*3/uL (ref 1.7–7.7)
Neutrophils Relative %: 65 %
Platelets: 112 10*3/uL — ABNORMAL LOW (ref 150–400)
RBC: 3.85 MIL/uL — ABNORMAL LOW (ref 3.87–5.11)
RDW: 14 % (ref 11.5–15.5)
WBC: 5.6 10*3/uL (ref 4.0–10.5)
nRBC: 0.7 % — ABNORMAL HIGH (ref 0.0–0.2)

## 2021-12-07 LAB — LACTIC ACID, PLASMA: Lactic Acid, Venous: 1.2 mmol/L (ref 0.5–1.9)

## 2021-12-07 LAB — CBG MONITORING, ED: Glucose-Capillary: 118 mg/dL — ABNORMAL HIGH (ref 70–99)

## 2021-12-07 LAB — TROPONIN I (HIGH SENSITIVITY): Troponin I (High Sensitivity): 7 ng/L (ref <18)

## 2021-12-07 IMAGING — CR DG LUMBAR SPINE COMPLETE 4+V
5 series · 5 of 5 positions shown · non-contrast
Comparison: Staging CT Chest, Abdomen, and Pelvis [DATE].

CLINICAL DATA: 65-year-old female with low back pain upon waking.
No known injury. Breast cancer.

EXAM:
LUMBAR SPINE - COMPLETE 4+ VIEW

[t lumbar spine ap]
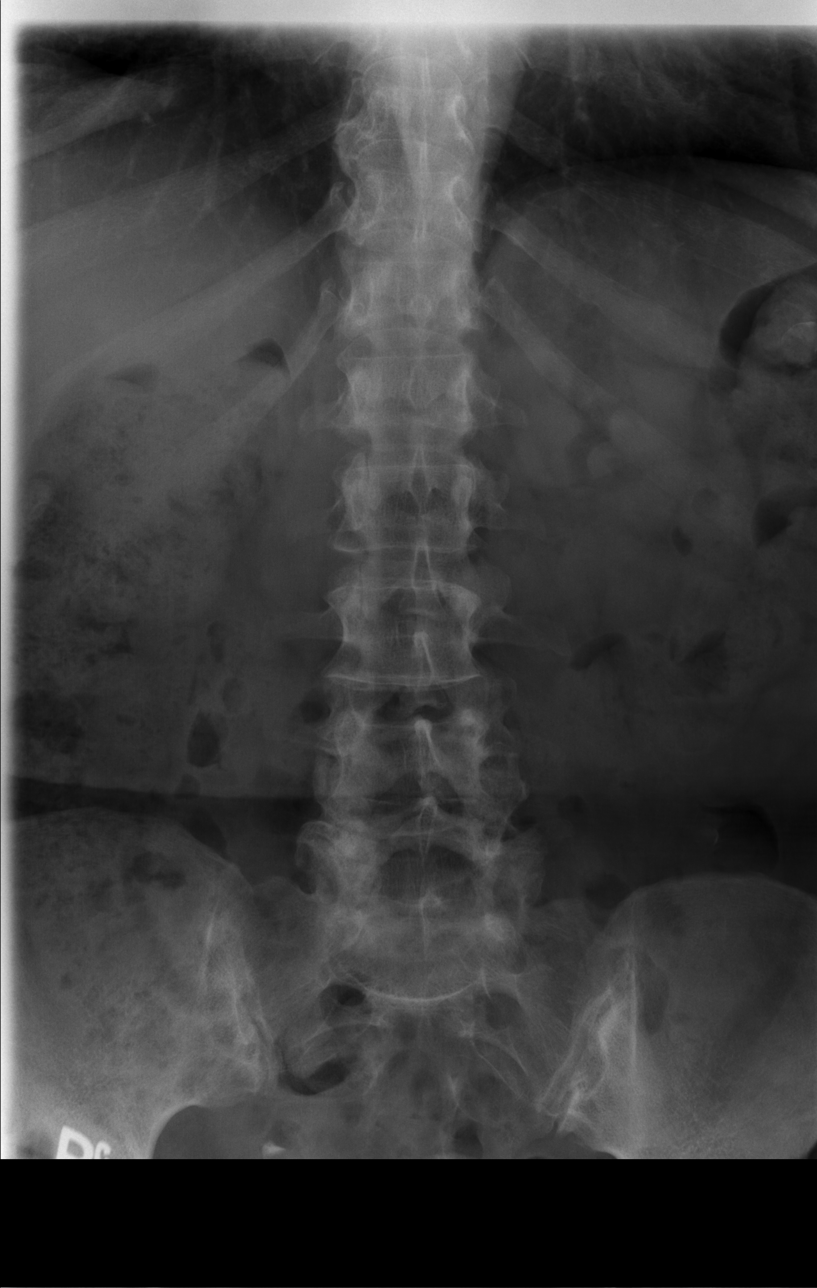

[t lumbar spine obl (1 of 2)]
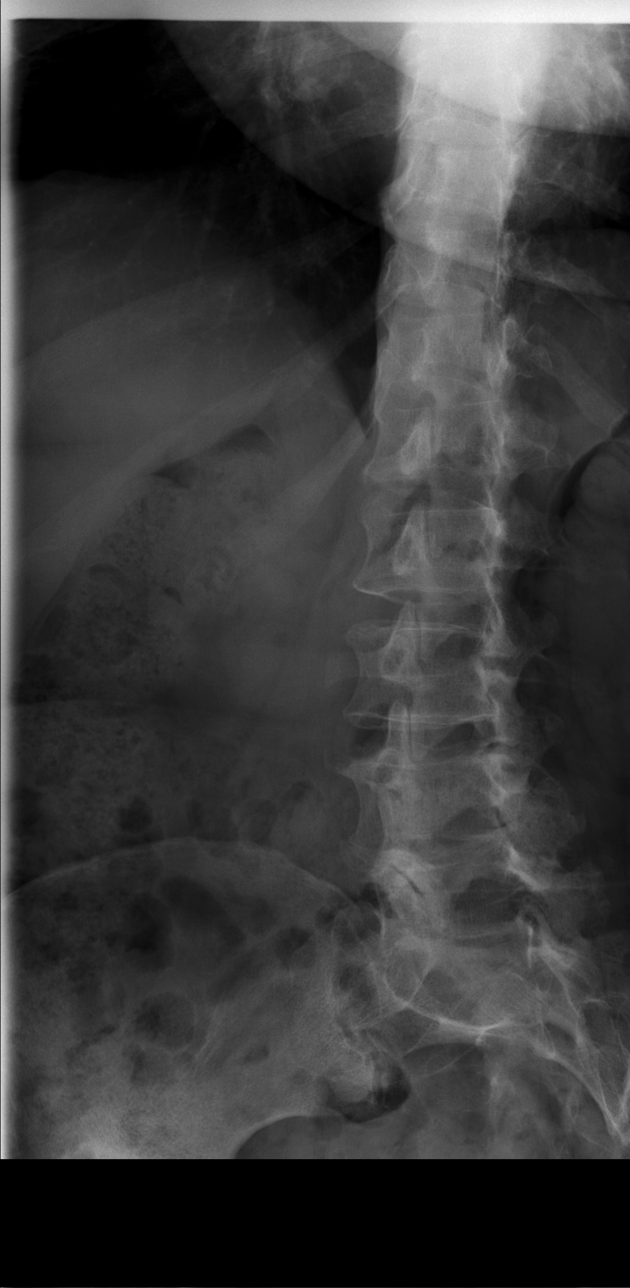

[t lumbar spine obl (2 of 2)]
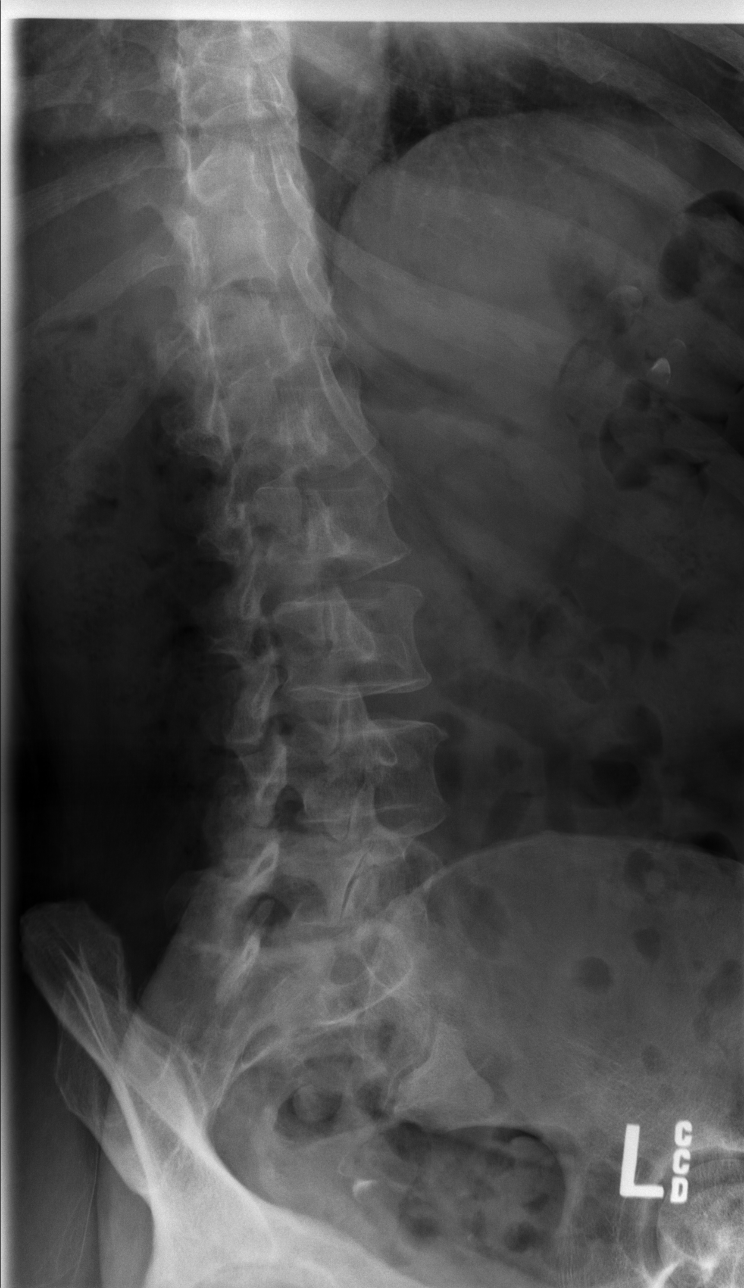

[t lumbar spine lat]
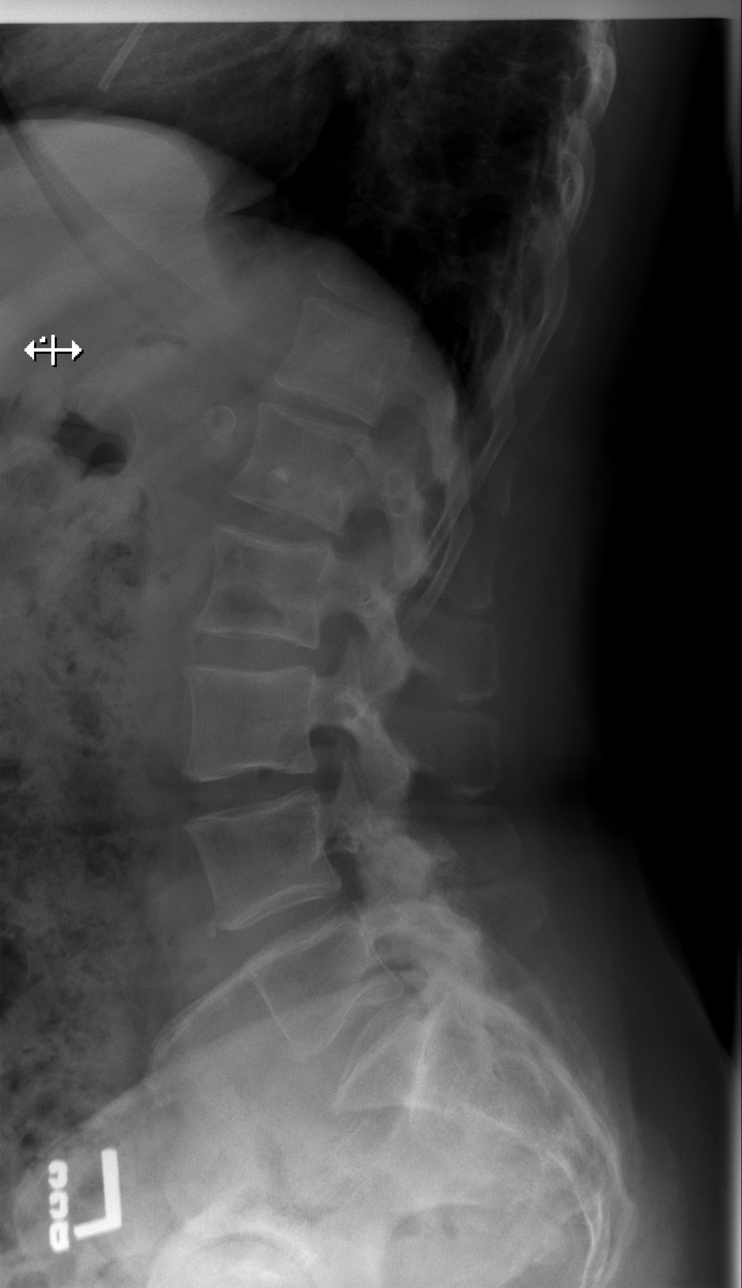

[t lumbar l-5 s-1 spot]
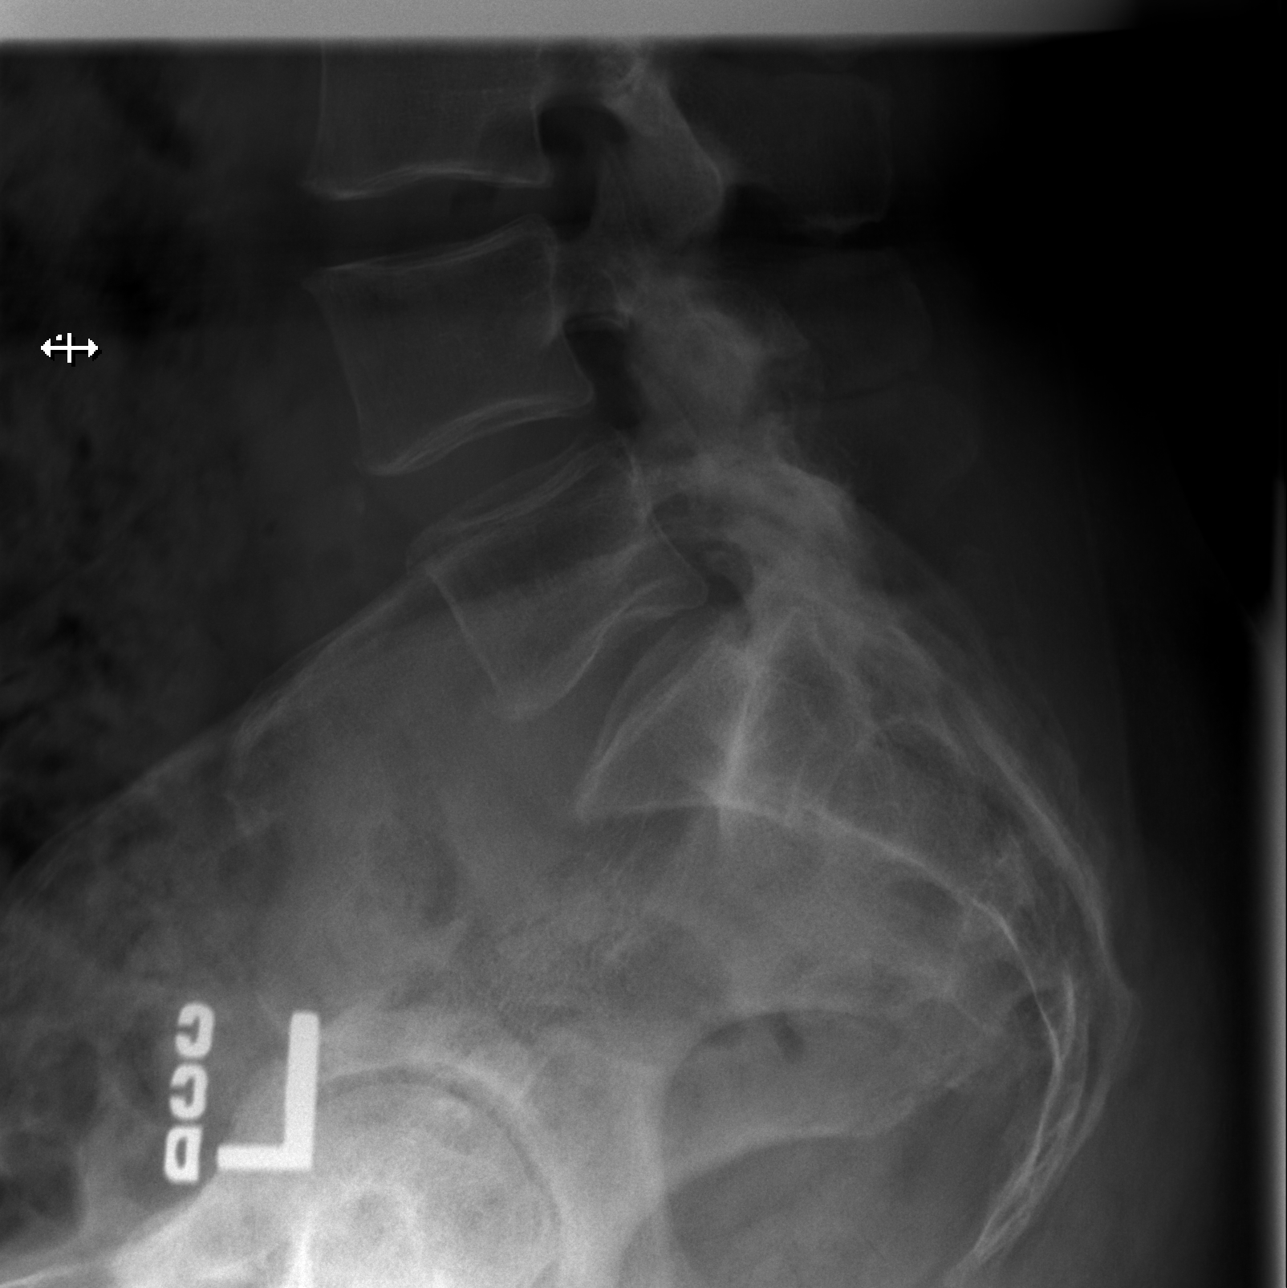

[5 of 5 positions shown; findings below may reference images not displayed]

FINDINGS: Normal lumbar segmentation. Stable vertebral height and alignment.
Moderate lumbar facet hypertrophy appears stable. Relatively
preserved disc spaces. No acute osseous abnormality identified.
Nonobstructed visible bowel gas pattern.
IMPRESSION: Lower lumbar facet arthropathy stable from recent CT Abdomen and
Pelvis. No acute osseous abnormality identified.

## 2021-12-07 IMAGING — DX DG CHEST 1V PORT
1 series · 1 of 1 positions shown · non-contrast
Comparison: [DATE].

CLINICAL DATA: Questionable sepsis.  Hypotension and dizziness.

EXAM:
PORTABLE CHEST 1 VIEW

[chest ap]
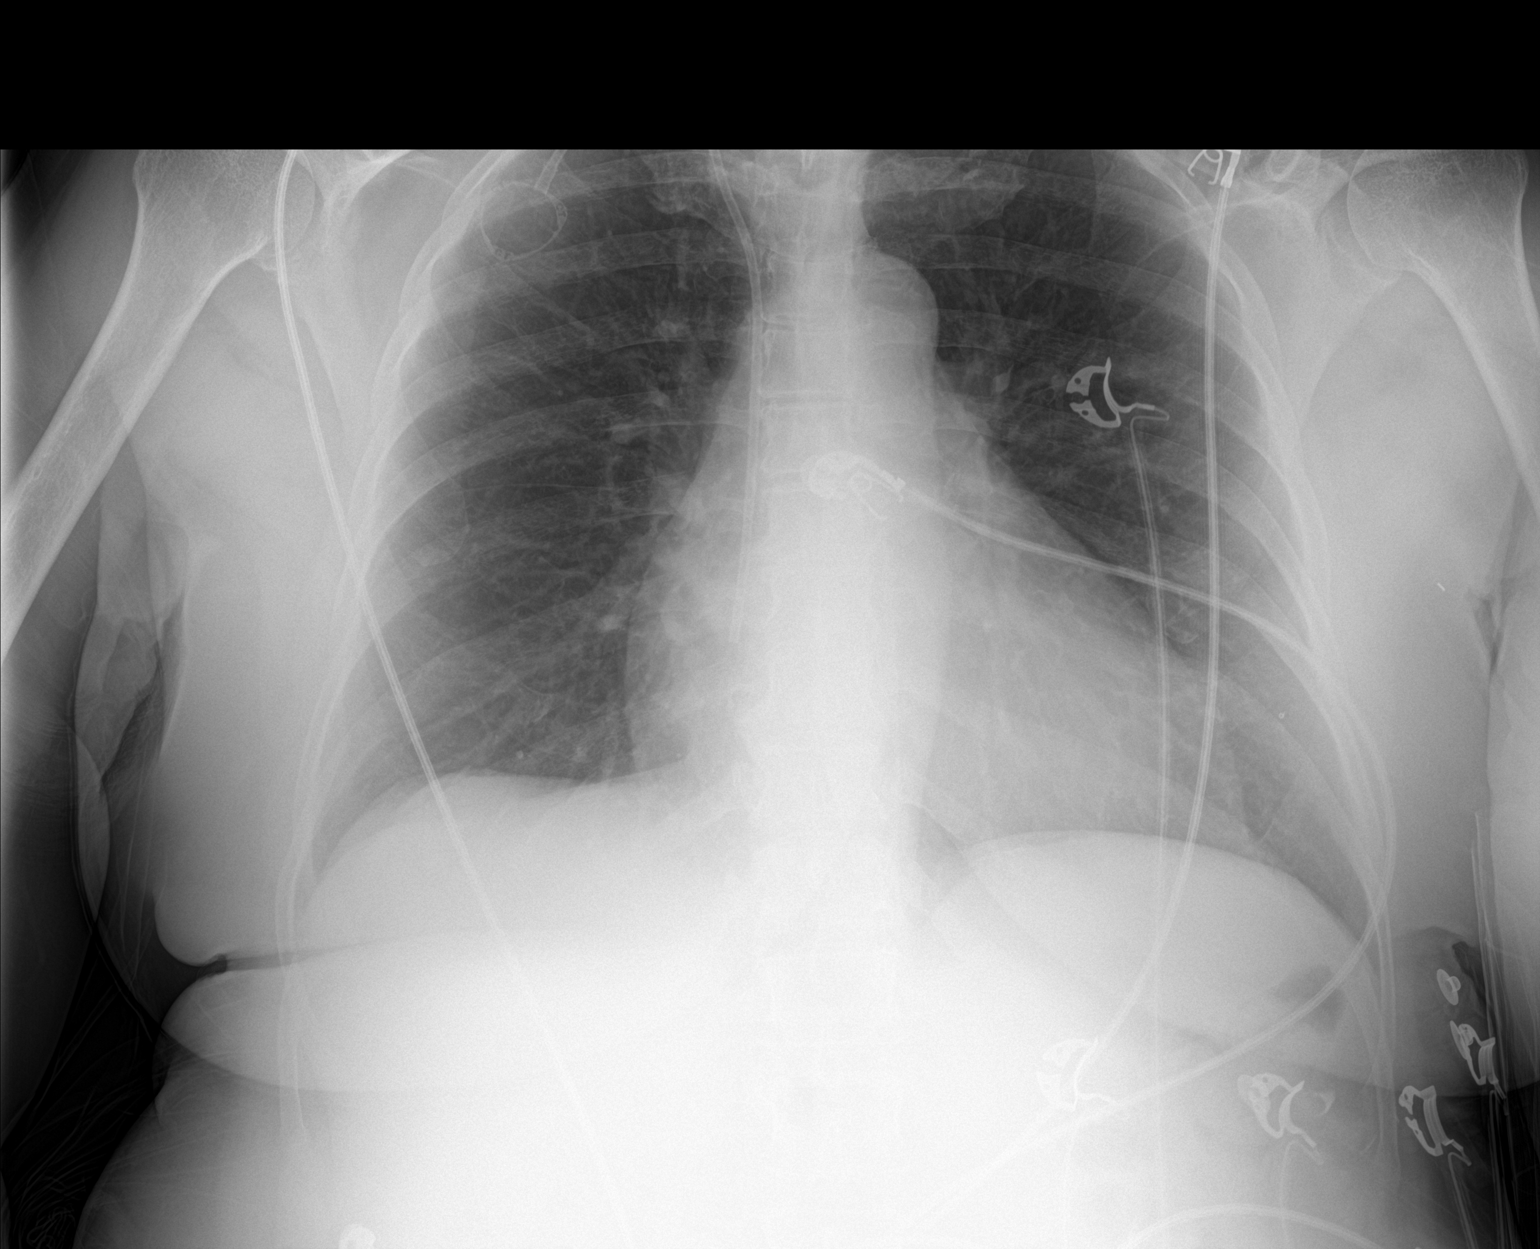

[1 of 1 positions shown; findings below may reference images not displayed]

FINDINGS: The heart size and mediastinal contours are stable. No
consolidation, effusion, or pneumothorax. A stable chest port is
noted on the right. No acute osseous abnormality.
IMPRESSION: Stable chest with no active disease.

## 2021-12-07 MED ORDER — LACTATED RINGERS IV BOLUS (SEPSIS)
1000.0000 mL | Freq: Once | INTRAVENOUS | Status: AC
  Administered 2021-12-08: 1000 mL via INTRAVENOUS

## 2021-12-07 MED ORDER — SODIUM CHLORIDE 0.9 % IV SOLN
2.0000 g | Freq: Once | INTRAVENOUS | Status: AC
  Administered 2021-12-08: 2 g via INTRAVENOUS
  Filled 2021-12-07: qty 2

## 2021-12-07 MED ORDER — LACTATED RINGERS IV SOLN: INTRAVENOUS | Status: DC

## 2021-12-07 MED ORDER — VANCOMYCIN HCL IN DEXTROSE 1-5 GM/200ML-% IV SOLN: 1000.0000 mg | Freq: Once | INTRAVENOUS | Status: DC

## 2021-12-07 MED ORDER — VANCOMYCIN HCL 2000 MG/400ML IV SOLN
2000.0000 mg | Freq: Once | INTRAVENOUS | Status: DC
  Filled 2021-12-07: qty 400

## 2021-12-07 MED ORDER — ACETAMINOPHEN 325 MG PO TABS
650.0000 mg | ORAL_TABLET | Freq: Once | ORAL | Status: AC
  Administered 2021-12-08: 650 mg via ORAL
  Filled 2021-12-07: qty 2

## 2021-12-07 MED ORDER — METRONIDAZOLE 500 MG/100ML IV SOLN
500.0000 mg | Freq: Once | INTRAVENOUS | Status: AC
  Administered 2021-12-08: 500 mg via INTRAVENOUS
  Filled 2021-12-07: qty 100

## 2021-12-07 MED ORDER — ACETAMINOPHEN 325 MG PO TABS
650.0000 mg | ORAL_TABLET | Freq: Once | ORAL | Status: AC
  Administered 2021-12-07: 650 mg via ORAL
  Filled 2021-12-07: qty 2

## 2021-12-07 NOTE — ED Notes (Signed)
Pt ambulated to restroom without assistance.

## 2021-12-07 NOTE — Sepsis Progress Note (Signed)
Monitoring for the code sepsis protocol. °

## 2021-12-07 NOTE — ED Provider Triage Note (Signed)
Emergency Medicine Provider Triage Evaluation Note  Cassandra Brennan , a 66 y.o. female  was evaluated in triage.  Pt complains of near syncopal event. She was here earlier today and was discharged.  She states that she was at home and stood up and got very lightheaded and almost passed out causing her to call EMS.  She feels like since then her heart has been beating very quickly.  She is actively getting chemo.    Physical Exam  BP (!) 154/90 (BP Location: Left Arm)    Pulse (!) 115    Temp 100 F (37.8 C) (Oral)    Resp 16    Ht 5\' 5"  (1.651 m)    Wt (!) 183 kg    SpO2 98%    BMI 67.14 kg/m  Gen:   Awake, no distress   Resp:  Normal effort  MSK:   Moves extremities without difficulty  Other:  Patient is tachycardic with a heart rate of 115.  She is borderline febrile as her temp is 100 however she does look flushed and minimally diaphoretic raising concern that she may actually have a fever. She is moved to a room in the back.  Medical Decision Making  Medically screening exam initiated at 10:06 PM.  Appropriate orders placed.  Cassandra Brennan was informed that the remainder of the evaluation will be completed by another provider, this initial triage assessment does not replace that evaluation, and the importance of remaining in the ED until their evaluation is complete.  Note: Portions of this report may have been transcribed using voice recognition software. Every effort was made to ensure accuracy; however, inadvertent computerized transcription errors may be present    Lorin Glass, Hershal Coria 12/07/21 2207

## 2021-12-07 NOTE — Progress Notes (Signed)
A consult was received from an ED physician for Vancomycin and Cefepime per pharmacy dosing.  The patient's profile has been reviewed for ht/wt/allergies/indication/available labs.    A one time order has been placed for Vancomycin 2gm IV and Cefepime 2gm IV.    Further antibiotics/pharmacy consults should be ordered by admitting physician if indicated.                       Thank you, Everette Rank, PharmD 12/07/2021  11:20 PM

## 2021-12-07 NOTE — ED Provider Notes (Signed)
[facility] DEPT Provider Note   CSN: [id number] Arrival date & time: 12/07/21  0645     History  Chief Complaint  Patient presents with   Back Pain    Cassandra Brennan is a 66 y.o. female history includes breast cancer currently undergoing chemotherapy, hypertension, hyperlipidemia, GERD.  Patient presented today for back pain onset yesterday, pain was sudden onset, sharp bilateral low back pain no clear aggravating or alleviating factors, no radiation of pain, pain worsened this morning which prompted patient's ED visit.  She denies similar pain in the past.  Patient denies fever/chills, nausea, vomiting, abdominal pain, dysuria/hematuria, saddle paresthesias, bowel/bladder incontinence, urinary retention, extremity numbness/tingling, weakness or any additional concerns.  HPI     Home Medications Prior to Admission medications   Medication Sig Start Date End Date Taking? Authorizing Provider  ALPRAZolam Duanne Moron) 0.5 MG tablet Take 0.5 mg by mouth at bedtime as needed for anxiety.    [provider]  ibuprofen (ADVIL) 800 MG tablet Take 1 tablet (800 mg total) by mouth every 8 (eight) hours as needed. 11/27/21   Cornett, Marcello Moores, MD  lidocaine-prilocaine (EMLA) cream Apply to affected area once 11/16/21   Truitt Merle, MD  metoprolol succinate (TOPROL-XL) 100 MG 24 hr tablet Take 1 tablet (100 mg total) by mouth daily. Take with or immediately following a meal. 03/15/20   Cheyenne Adas, NP  ondansetron (ZOFRAN) 8 MG tablet Take 1 tablet (8 mg total) by mouth 2 (two) times daily as needed. Start on the third day after chemotherapy. 11/08/21   Truitt Merle, MD  oxyCODONE (OXY IR/ROXICODONE) 5 MG immediate release tablet Take 1 tablet (5 mg total) by mouth every 6 (six) hours as needed for severe pain. 11/27/21   Cornett, Marcello Moores, MD  pravastatin (PRAVACHOL) 20 MG tablet Take 1 tablet (20 mg total) by mouth daily. 03/15/20   Cheyenne Adas, NP   prochlorperazine (COMPAZINE) 10 MG tablet Take 1 tablet (10 mg total) by mouth every 6 (six) hours as needed (Nausea or vomiting). 11/08/21   Truitt Merle, MD      Allergies    Prednisone, Rosuvastatin, Other, Robitussin (alcohol free) [guaifenesin], and Sulfa antibiotics    Review of Systems   Review of Systems Ten systems are reviewed and are negative for acute change except as noted in the HPI  Physical Exam Updated Vital Signs BP (!) 162/92    Pulse 99    Temp 98.4 F (36.9 C) (Oral)    Resp 19    SpO2 99%  Physical Exam Constitutional:      General: She is not in acute distress.    Appearance: Normal appearance. She is well-developed. She is not ill-appearing or diaphoretic.  HENT:     Head: Normocephalic and atraumatic.  Eyes:     General: Vision grossly intact. Gaze aligned appropriately.     Pupils: Pupils are equal, round, and reactive to light.  Neck:     Trachea: Trachea and phonation normal.  Pulmonary:     Effort: Pulmonary effort is normal. No respiratory distress.  Abdominal:     General: There is no distension.     Palpations: Abdomen is soft.     Tenderness: There is no abdominal tenderness. There is no guarding or rebound.  Musculoskeletal:        General: Normal range of motion.     Cervical back: Normal range of motion.     Comments: No midline spinal tenderness palpation.  No crepitus step-off  or deformity the spine.  Bilateral paralumbar muscular tenderness palpation without point tenderness.  Bilateral SI joint tenderness palpation.  No significant increased pain with flexion/extension, lateral bend or rotation of the lumbar spine.  Sensation and capillary refill intact to all toes.  Patient does report some diminished sensation to her left great toe but that is been baseline since she fractured that toe several years ago and is unchanged.  Pedal pulses intact and equal bilaterally.  DTR 1+ bilateral patella.  No clonus of the feet.  Strong equal  dorsi/plantarflexion, EHL, knee flexion/extension and hip flexion.  Skin:    General: Skin is warm and dry.  Neurological:     Mental Status: She is alert.     GCS: GCS eye subscore is 4. GCS verbal subscore is 5. GCS motor subscore is 6.     Comments: Speech is clear and goal oriented, follows commands Major Cranial nerves without deficit, no facial droop Moves extremities without ataxia, coordination intact  Psychiatric:        Behavior: Behavior normal.    ED Results / Procedures / Treatments   Labs (all labs ordered are listed, but only abnormal results are displayed) Labs Reviewed  URINALYSIS, ROUTINE W REFLEX MICROSCOPIC - Abnormal; Notable for the following components:      Result Value   Leukocytes,Ua SMALL (*)    Bacteria, UA RARE (*)    All other components within normal limits    EKG EKG Interpretation  Date/Time:  Thursday December 07 2021 07:57:26 EST Ventricular Rate:  98 PR Interval:  156 QRS Duration: 87 QT Interval:  346 QTC Calculation: 442 R Axis:   43 Text Interpretation: Sinus rhythm Borderline T abnormalities, diffuse leads When compared to prior, similar appearance. No STEMI Confirmed by Antony Blackbird [phone]) on 12/07/2021 8:59:41 AM  Radiology DG Lumbar Spine Complete  Result Date: 12/07/2021 CLINICAL DATA:  66 year old female with low back pain upon waking. No known injury. Breast cancer. EXAM: LUMBAR SPINE - COMPLETE 4+ VIEW COMPARISON:  Staging CT Chest, Abdomen, and Pelvis 11/23/2021. FINDINGS: Normal lumbar segmentation. Stable vertebral height and alignment. Moderate lumbar facet hypertrophy appears stable. Relatively preserved disc spaces. No acute osseous abnormality identified. Nonobstructed visible bowel gas pattern. IMPRESSION: Lower lumbar facet arthropathy stable from recent CT Abdomen and Pelvis. No acute osseous abnormality identified. Electronically Signed   By: Genevie Ann M.D.   On: 12/07/2021 08:29    Procedures Procedures     Medications Ordered in ED Medications  acetaminophen (TYLENOL) tablet 650 mg (650 mg Oral Given 12/07/21 0759)    ED Course/ Medical Decision Making/ A&P                           Medical Decision Making Amount and/or Complexity of Data Reviewed External Data Reviewed: notes.    Details: I reviewed patient's oncology note from 12/04/2020.  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  Work-up began at the end of December for breast mass.  Diagnosis 10/27/2021.  Patient had breast MRI 11/15/2021 followed by CT abdomen pelvis on 11/23/2021.  This showed breast cancer and bulky axillary adenopathy, liver cyst and liver nodules without evidence for distant metastases.  Bone scan performed was also negative for osseous metastases.  Patient began neoadjuvant of AC on 11/28/2021.  Cycle 2 on 12/11/2021. Labs: ordered.    Details: Urinalysis shows small leukocytes, rare bacteria otherwise within normal limits.  No hematuria to suggest kidney stone disease.  No symptoms or significant findings to suggest UTI. Radiology: ordered.    Details: I have personally reviewed patient's lumbar spine x-ray today.  Evidence of osteoarthritic facet disease, degenerative disc disease and endplate spurring.  I do not appreciate any obvious acute fractures or dislocations.  Possible chronic compression deformity at L5.  Radiologist interpretation: IMPRESSION: Lower lumbar facet arthropathy stable from recent CT Abdomen and Pelvis. No acute osseous abnormality identified.  Risk OTC drugs.  ------------------------------ Patient was given 650 mg of Tylenol during this visit.  Patient reports pain has significantly improved since that time.  Overall work-up today is reassuring suspect patient may be experiencing a muscle strain.  No red flags at this time to suggest cauda equina, AAA, dissection, spinal epidural abscess, kidney stone disease, dissection or other emergent  causes of low back pain.  Patient has a reassuring neurologic examination.  Plan of care is for patient to follow-up with her PCP and oncologist for recheck this week.  Incidentally patient is slightly hypertensive here, suspect this secondary to her pain, no red flags for hypertensive urgency/emergency patient will have this rechecked by her PCP/oncologist.   At this time there does not appear to be any evidence of an acute emergency medical condition and the patient appears stable for discharge with appropriate outpatient follow up. Diagnosis was discussed with patient who verbalizes understanding of care plan and is agreeable to discharge. I have discussed return precautions with patient and daughter who verbalizes understanding. Patient encouraged to follow-up with their PCP and oncologist. All questions answered.  Patient's case discussed with Dr. Gustavus Messing who agrees with plan to discharge with follow-up.   Note: Portions of this report may have been transcribed using voice recognition software. Every effort was made to ensure accuracy; however, inadvertent computerized transcription errors may still be present.         Final Clinical Impression(s) / ED Diagnoses Final diagnoses:  Acute bilateral low back pain without sciatica  Elevated blood pressure reading    Rx / DC Orders ED Discharge Orders     None         Gari Crown 12/07/21 5320    Tegeler, Gwenyth Allegra, MD 12/07/21 1023

## 2021-12-07 NOTE — ED Notes (Signed)
Pt reports having constipation as well. MD aware.

## 2021-12-07 NOTE — ED Triage Notes (Signed)
Pt states lower back pain since yesterday, pain worse through the night. Walking makes it worse.

## 2021-12-07 NOTE — Discharge Instructions (Signed)
At this time there does not appear to be the presence of an emergent medical condition, however there is always the potential for conditions to change. Please read and follow the below instructions.  Please return to the Emergency Department immediately for any new or worsening symptoms. Please be sure to follow up with your Primary Care Provider within one week regarding your visit today; please call their office to schedule an appointment even if you are feeling better for a follow-up visit. Please call your oncologist today to inform them of your ER visit and to schedule a follow-up appointment. Your blood pressure was elevated in the ER today, please have it rechecked by your primary care provider this week to ensure improvement.  Go to the nearest Emergency Department immediately if: You have fever or chills You develop new bowel or bladder control problems. You have unusual weakness or numbness in your arms or legs. You feel faint. You develop nausea or vomiting. You develop abdominal pain. You have any new/concerning or worsening of symptoms.   Please read the additional information packets attached to your discharge summary.  Do not take your medicine if  develop an itchy rash, swelling in your mouth or lips, or difficulty breathing; call 911 and seek immediate emergency medical attention if this occurs.  You may review your lab tests and imaging results in their entirety on your MyChart account.  Please discuss all results of fully with your primary care provider and other specialist at your follow-up visit.  Note: Portions of this text may have been transcribed using voice recognition software. Every effort was made to ensure accuracy; however, inadvertent computerized transcription errors may still be present.

## 2021-12-07 NOTE — ED Triage Notes (Signed)
Pt BIB EMS from home. Pt c/o hypertension and dizziness starting this evening. Pt was here at [facility] earlier today for back pain and was discharged. Pt started her chemo treatment 2/8 for breast cancer. 178/105 Pt has hx of HTN, has been taking her prescribed meds, stated she hasnt had her BP med adjusted in years.  HR 113 CBG 146 96% RA

## 2021-12-07 NOTE — Telephone Encounter (Signed)
Pt called stating she went to the [facility] ED last night d/t lower back pain.  The ED did several test and a UA which were all negative.  Pt was discharged home.  Pt just wanted to let Dr. Burr Medico know of her recent ED visit.  Informed pt that this RN will notify Dr. Burr Medico.

## 2021-12-07 NOTE — Progress Notes (Signed)
This nurse spoke with [facility] rep and provided verbal order for Ziextenzo 6mg  Every 2 weeks for 3 more doses following chemo cycle.  Also faxed over written order.  No further concerns at this time.

## 2021-12-08 ENCOUNTER — Telehealth: Payer: Self-pay | Admitting: Internal Medicine

## 2021-12-08 DIAGNOSIS — F419 Anxiety disorder, unspecified: Secondary | ICD-10-CM | POA: Diagnosis not present

## 2021-12-08 LAB — RESP PANEL BY RT-PCR (FLU A&B, COVID) ARPGX2
Influenza A by PCR: NEGATIVE
Influenza B by PCR: NEGATIVE
SARS Coronavirus 2 by RT PCR: NEGATIVE

## 2021-12-08 LAB — PROTIME-INR
INR: 1.1 (ref 0.8–1.2)
Prothrombin Time: 13.9 seconds (ref 11.4–15.2)

## 2021-12-08 LAB — APTT: aPTT: 29 seconds (ref 24–36)

## 2021-12-08 LAB — LACTIC ACID, PLASMA: Lactic Acid, Venous: 1.1 mmol/L (ref 0.5–1.9)

## 2021-12-08 MED ORDER — ALPRAZOLAM 0.25 MG PO TABS: 0.2500 mg | ORAL_TABLET | Freq: Three times a day (TID) | ORAL | 0 refills | Status: DC | PRN

## 2021-12-08 MED FILL — Fosaprepitant Dimeglumine For IV Infusion 150 MG (Base Eq): INTRAVENOUS | Qty: 5 | Status: AC

## 2021-12-08 MED FILL — Dexamethasone Sodium Phosphate Inj 100 MG/10ML: INTRAMUSCULAR | Qty: 1 | Status: AC

## 2021-12-08 NOTE — Telephone Encounter (Signed)
Called patient to followup from last's night [facility] ER visit. Pt started taking scheduled tylenol as discussed not having anymore pain. No fevers. No chills. Pt state she slept until 5:30 pm today due to DC from ER around 3:30AM.  Pt states she feels so much better. No longer having palpitations. And her anxiety is much improved. She did not get a change to call oncology navigator today since she go up at 5:30 pm.  But overall she states she feel much better. She thanked me for the followup phone call.

## 2021-12-08 NOTE — ED Provider Notes (Signed)
[facility] DEPT Provider Note   CSN: [id number] Arrival date & time: 12/07/21  2152     History  Chief Complaint  Patient presents with   Dizziness    Cassandra Brennan is a 66 y.o. female.  HPI  66 year old female with a history of anxiety, arthritis, GERD, hyperlipidemia, hypertension, breast cancer, who presents to the emergency department today for evaluation of an episode of lightheadedness and near syncope.  States she went from sitting to standing when she suddenly felt lightheaded and like she was in a pass out.  There is no reported trauma.  She was seen earlier today for evaluation of back pain and had a reassuring work-up at that time.  She states that since leaving the hospital she has started to develop urinary frequency.  She denies any dysuria.  She also notes that her port seems somewhat red and they were unable to access it earlier this week.  It is mildly tender to palpation.  She states she did not think anything of it since the port is new and she was not sure what it was supposed to look like.  Additionally she reports she had redness to the left breast (at the site of her CA) earlier in the week that has resolved. She denies any fevers or chills at home.  Denies any chest pain, shortness of breath, cough, abdominal pain nausea vomiting or diarrhea.  Last chemo was about 10 days ago  Home Medications Prior to Admission medications   Medication Sig Start Date End Date Taking? Authorizing Provider  acetaminophen (TYLENOL) 500 MG tablet Take 500 mg by mouth every 8 (eight) hours as needed for moderate pain.   Yes [provider]  lidocaine-prilocaine (EMLA) cream Apply to affected area once 11/16/21  Yes Truitt Merle, MD  metoprolol succinate (TOPROL-XL) 100 MG 24 hr tablet Take 1 tablet (100 mg total) by mouth daily. Take with or immediately following a meal. 03/15/20  Yes Cheyenne Adas, NP  ondansetron (ZOFRAN) 8 MG tablet Take 1  tablet (8 mg total) by mouth 2 (two) times daily as needed. Start on the third day after chemotherapy. 11/08/21  Yes Truitt Merle, MD  oxyCODONE (OXY IR/ROXICODONE) 5 MG immediate release tablet Take 1 tablet (5 mg total) by mouth every 6 (six) hours as needed for severe pain. 11/27/21  Yes Cornett, Marcello Moores, MD  pravastatin (PRAVACHOL) 20 MG tablet Take 1 tablet (20 mg total) by mouth daily. 03/15/20  Yes Cheyenne Adas, NP  prochlorperazine (COMPAZINE) 10 MG tablet Take 1 tablet (10 mg total) by mouth every 6 (six) hours as needed (Nausea or vomiting). 11/08/21  Yes Truitt Merle, MD  ALPRAZolam Duanne Moron) 0.5 MG tablet Take 0.5 mg by mouth at bedtime as needed for anxiety. Patient not taking: Reported on 12/07/2021    [provider]      Allergies    Prednisone, Rosuvastatin, Other, Robitussin (alcohol free) [guaifenesin], and Sulfa antibiotics    Review of Systems   Review of Systems See HPI for pertinent positives or negatives.   Physical Exam Updated Vital Signs BP (!) 138/96    Pulse 90    Temp 100 F (37.8 C) (Oral)    Resp (!) 21    Ht 5\' 5"  (1.651 m)    Wt (!) 183 kg    SpO2 98%    BMI 67.14 kg/m  Physical Exam Vitals and nursing note reviewed.  Constitutional:      General: She is not in  acute distress.    Appearance: She is well-developed.  HENT:     Head: Normocephalic and atraumatic.  Eyes:     Conjunctiva/sclera: Conjunctivae normal.  Cardiovascular:     Rate and Rhythm: Regular rhythm. Tachycardia present.     Heart sounds: Normal heart sounds. No murmur heard. Pulmonary:     Effort: Pulmonary effort is normal. No respiratory distress.     Breath sounds: Normal breath sounds. No wheezing, rhonchi or rales.     Comments: Port to right upper chest is erythematous and warm to touch with some mild TTP Chest:     Comments: Erythema and warmth noted around the left nipple. No induration or fluctuance. No nipple inversion and no nipple discharge present. Abdominal:      General: Bowel sounds are normal.     Palpations: Abdomen is soft.     Tenderness: There is no abdominal tenderness. There is no guarding or rebound.  Musculoskeletal:        General: No swelling.     Cervical back: Neck supple.  Skin:    General: Skin is warm and dry.     Capillary Refill: Capillary refill takes less than 2 seconds.  Neurological:     Mental Status: She is alert.  Psychiatric:        Mood and Affect: Mood normal.    ED Results / Procedures / Treatments   Labs (all labs ordered are listed, but only abnormal results are displayed) Labs Reviewed  URINALYSIS, ROUTINE W REFLEX MICROSCOPIC - Abnormal; Notable for the following components:      Result Value   Color, Urine STRAW (*)    Leukocytes,Ua MODERATE (*)    Bacteria, UA FEW (*)    All other components within normal limits  COMPREHENSIVE METABOLIC PANEL - Abnormal; Notable for the following components:   Glucose, Bld 132 (*)    All other components within normal limits  CBC WITH DIFFERENTIAL/PLATELET - Abnormal; Notable for the following components:   RBC 3.85 (*)    Hemoglobin 10.9 (*)    HCT 32.8 (*)    Platelets 112 (*)    nRBC 0.7 (*)    Abs Immature Granulocytes 0.40 (*)    All other components within normal limits  CBG MONITORING, ED - Abnormal; Notable for the following components:   Glucose-Capillary 118 (*)    All other components within normal limits  RESP PANEL BY RT-PCR (FLU A&B, COVID) ARPGX2  CULTURE, BLOOD (ROUTINE X 2)  CULTURE, BLOOD (ROUTINE X 2)  CULTURE, BLOOD (SINGLE)  URINE CULTURE  LACTIC ACID, PLASMA  LACTIC ACID, PLASMA  PROTIME-INR  APTT  TROPONIN I (HIGH SENSITIVITY)  TROPONIN I (HIGH SENSITIVITY)    EKG EKG Interpretation  Date/Time:  Thursday December 07 2021 22:59:21 EST Ventricular Rate:  104 PR Interval:  165 QRS Duration: 88 QT Interval:  331 QTC Calculation: 436 R Axis:   62 Text Interpretation: Sinus tachycardia Abnormal R-wave progression, early  transition Borderline repolarization abnormality Confirmed by Quintella Reichert [phone]) on 12/07/2021 11:11:29 PM  Radiology DG Lumbar Spine Complete  Result Date: 12/07/2021 CLINICAL DATA:  66 year old female with low back pain upon waking. No known injury. Breast cancer. EXAM: LUMBAR SPINE - COMPLETE 4+ VIEW COMPARISON:  Staging CT Chest, Abdomen, and Pelvis 11/23/2021. FINDINGS: Normal lumbar segmentation. Stable vertebral height and alignment. Moderate lumbar facet hypertrophy appears stable. Relatively preserved disc spaces. No acute osseous abnormality identified. Nonobstructed visible bowel gas pattern. IMPRESSION: Lower lumbar facet arthropathy stable from recent  CT Abdomen and Pelvis. No acute osseous abnormality identified. Electronically Signed   By: Genevie Ann M.D.   On: 12/07/2021 08:29   DG Chest Port 1 View  Result Date: 12/07/2021 CLINICAL DATA:  Questionable sepsis.  Hypotension and dizziness. EXAM: PORTABLE CHEST 1 VIEW COMPARISON:  11/27/2021. FINDINGS: The heart size and mediastinal contours are stable. No consolidation, effusion, or pneumothorax. A stable chest port is noted on the right. No acute osseous abnormality. IMPRESSION: Stable chest with no active disease. Electronically Signed   By: Brett Fairy M.D.   On: 12/07/2021 23:34    Procedures .Critical Care Performed by: Rodney Booze, PA-C Authorized by: Rodney Booze, PA-C   Critical care provider statement:    Critical care time (minutes):  33   Critical care time was exclusive of:  Separately billable procedures and treating other patients and teaching time   Critical care was necessary to treat or prevent imminent or life-threatening deterioration of the following conditions:  Sepsis   Critical care was time spent personally by me on the following activities:  Development of treatment plan with patient or surrogate, discussions with consultants, evaluation of patient's response to treatment, examination of  patient, ordering and review of laboratory studies, ordering and review of radiographic studies, ordering and performing treatments and interventions, pulse oximetry, re-evaluation of patient's condition and review of old charts   I assumed direction of critical care for this patient from another provider in my specialty: no     Care discussed with: admitting provider      Medications Ordered in ED Medications  lactated ringers infusion (has no administration in time range)  metroNIDAZOLE (FLAGYL) IVPB 500 mg (500 mg Intravenous New Bag/Given 12/08/21 0127)  vancomycin (VANCOREADY) IVPB 2000 mg/400 mL (has no administration in time range)  lactated ringers bolus 1,000 mL (1,000 mLs Intravenous New Bag/Given 12/08/21 0017)  ceFEPIme (MAXIPIME) 2 g in sodium chloride 0.9 % 100 mL IVPB (2 g Intravenous New Bag/Given 12/08/21 0018)  acetaminophen (TYLENOL) tablet 650 mg (650 mg Oral Given 12/08/21 0023)    ED Course/ Medical Decision Making/ A&P                           Medical Decision Making Amount and/or Complexity of Data Reviewed Labs: ordered. Radiology: ordered.  Risk OTC drugs. Prescription drug management.   This patient presents to the ED for concern of new syncope, lightheadedness, this involves an extensive number of treatment options, and is a complaint that carries with it a high risk of complications and morbidity.  The differential diagnosis includes but is not limited to arrhythmia, pe, dissection, infectious cause, metabolic derangement, sepsis, bacteremia   Comorbidities that complicate the patient evaluation: Patients presentation is complicated by their history of breast ca  Additional history obtained: Additional history obtained from family (daughter) Records reviewed Care Everywhere/External Records  Lab Tests: I Ordered, and personally interpreted labs.  The pertinent results include:   CBC w/o leukocytosis, mild anemia which appears somewhat worse from 1  month ago CMP reassuring Trop negative Coags negative UA with moderate leuks, 0-5 rbc, 6-10 wbc, few bacteria. Urine culture sent.   - reports urinary sxs, abx given, likely uti COVID/flu negative Lactic acid negative Blood cultures obtained Blood culture of port ordered however nursing staff were not able to identify landmarks of the port and did not feel comfortable accessing it   Imaging Studies ordered: I ordered, independently visualized, and interpreted  imaging which showed  Cxr - Stable chest with no active disease.  I agree with the radiologist interpretation  Cardiac Monitoring: The patient was maintained on a cardiac monitor.  I personally viewed and interpreted the cardiac monitor which showed an underlying rhythm of:  sinus tachycardia  Medicines ordered and prescription drug management: I ordered medication including abx, antipyretics, ivf  for sepsis  Reevaluation of the patient after these medicines showed that the patient    improved  Critical Interventions: iv abx, iv fluids  Complexity of problems addressed: Patients presentation is most consistent with  acute presentation with potential threat to life or bodily function  Disposition: After consideration of the diagnostic results and the patients response to treatment,  I feel that the patent would benefit from admission for tx of suspected sepsis secondary to uti and possibly bacteremia from infected port. There is also concern for possible early cellulitis to the left breast.  Consultations Obtained: 1:42 PM I consulted with the consultant Dr. Bridgett Larsson, and discussed  findings as well as pertinent plan - they will recommend  Final Clinical Impression(s) / ED Diagnoses Final diagnoses:  Sepsis, due to unspecified organism, unspecified whether acute organ dysfunction present [facility])    Rx / DC Orders ED Discharge Orders     None         Bishop Dublin 12/08/21 0157    Quintella Reichert,  MD 12/08/21 [phone]

## 2021-12-08 NOTE — Consult Note (Signed)
Hospitalist Consultation History and Physical    Cassandra Brennan RCV:[id number] DOB: [birth year] DOA: 12/07/2021  DOS: the patient was seen and examined on 12/07/2021  PCP: Everardo Beals, NP   Patient coming from: Home  I have personally briefly reviewed patient's old medical records in [place]  CC: back pain HPI: 66 year old African-American female with a recent history of breast cancer who started treatment on February 8.  She just had a port placed February 6.  She states that her initial chemotherapy went fine.  She got granulocyte colony-stimulating factor 2 days later.  She states she was told to take 7 days worth of scheduled Tylenol along with 7 days worth of scheduled Claritin.  He states that in a follow-up appointment to 3 days ago she was told to go ahead and stop it.  Today after she stopped her Tylenol and Claritin started having pain.  This became worse in nature and she became quite anxious about it.  She states that because of her anxiety she got on her treadmill this evening to help walk off some of her nervous energy.  After she got done walking, she did not cooldown but sat down on a chair next to her treadmill.  Less than a minute after she had sat down and she remembered she had to go into her kitchen and got up.  She stood up suddenly and started feeling lightheaded.  This may have made her more anxious.  She called EMS.  Patient denies any recent fever, chills.  She denies any urinary symptoms.  She has had no dysuria.  Has had no vomiting.  She had no nausea.  Has had no cough.   ED Course: CBC reassuring. UA was clean catch.  CXR negative for pneumonia  Review of Systems:  Review of Systems  Constitutional:  Negative for chills, diaphoresis, fever, malaise/fatigue and weight loss.  Eyes: Negative.   Respiratory: Negative.  Negative for cough, hemoptysis and sputum production.   Cardiovascular: Negative.   Gastrointestinal: Negative.  Negative for  abdominal pain, constipation, diarrhea, nausea and vomiting.  Genitourinary: Negative.  Negative for dysuria, frequency, hematuria and urgency.  Musculoskeletal:  Positive for back pain.  Skin: Negative.        Left nipple area erythema is less red than last week.  Neurological: Negative.   Endo/Heme/Allergies: Negative.   Psychiatric/Behavioral:  The patient is nervous/anxious.   All other systems reviewed and are negative.  Past Medical History:  Diagnosis Date   Anxiety    Arthritis    right knee    GERD (gastroesophageal reflux disease)    Hyperlipidemia    Hypertension    Positive colorectal cancer screening using Cologuard test     Past Surgical History:  Procedure Laterality Date   BREAST CYST EXCISION Right    pt stated in [place]     other GI testing     unsure but had to drink a chalky drink    PORTACATH PLACEMENT N/A 11/27/2021   Procedure: INSERTION PORT-A-CATH;  Surgeon: Erroll Luna, MD;  Location: [facility] ORS;  Service: General;  Laterality: N/A;   TONSILLECTOMY     TUBAL LIGATION       reports that she has never smoked. She has never used smokeless tobacco. She reports that she does not drink alcohol and does not use drugs.  Allergies  Allergen Reactions   Prednisone Hypertension   Rosuvastatin     Other reaction(s): Myalgias (Muscle Pain)  Other     Had a rxn to cortisone knee injection - had elevated BP and HR   Robitussin (Alcohol Free) [Guaifenesin] Hives   Sulfa Antibiotics Hives    Family History  Problem Relation Age of Onset   Colon polyps Sister    Colon polyps Sister    Colon polyps Sister    Colon cancer Maternal Uncle        dx. 38s   Throat cancer Maternal Uncle    Colon cancer Cousin        dx. >50   Gastric cancer Cousin        dx. >50   Breast cancer Cousin        dx. 107s   Kidney cancer Nephew    Esophageal cancer Neg Hx    Rectal cancer Neg Hx    Stomach cancer Neg Hx     Prior to Admission medications    Medication Sig Start Date End Date Taking? Authorizing Provider  acetaminophen (TYLENOL) 500 MG tablet Take 500 mg by mouth every 8 (eight) hours as needed for moderate pain.   Yes [provider]  lidocaine-prilocaine (EMLA) cream Apply to affected area once 11/16/21  Yes Truitt Merle, MD  metoprolol succinate (TOPROL-XL) 100 MG 24 hr tablet Take 1 tablet (100 mg total) by mouth daily. Take with or immediately following a meal. 03/15/20  Yes Cheyenne Adas, NP  ondansetron (ZOFRAN) 8 MG tablet Take 1 tablet (8 mg total) by mouth 2 (two) times daily as needed. Start on the third day after chemotherapy. 11/08/21  Yes Truitt Merle, MD  oxyCODONE (OXY IR/ROXICODONE) 5 MG immediate release tablet Take 1 tablet (5 mg total) by mouth every 6 (six) hours as needed for severe pain. 11/27/21  Yes Cornett, Marcello Moores, MD  pravastatin (PRAVACHOL) 20 MG tablet Take 1 tablet (20 mg total) by mouth daily. 03/15/20  Yes Cheyenne Adas, NP  prochlorperazine (COMPAZINE) 10 MG tablet Take 1 tablet (10 mg total) by mouth every 6 (six) hours as needed (Nausea or vomiting). 11/08/21  Yes Truitt Merle, MD  ALPRAZolam Duanne Moron) 0.5 MG tablet Take 0.5 mg by mouth at bedtime as needed for anxiety. Patient not taking: Reported on 12/07/2021    [provider]    Physical Exam: Vitals:   12/08/21 0030 12/08/21 0100 12/08/21 0130 12/08/21 0200  BP: 138/81 137/78 (!) 138/96   Pulse: 92 93 90 87  Resp: (!) 22 (!) 23 (!) 21 15  Temp:      TempSrc:      SpO2: 98% 97% 98% 96%  Weight:      Height:        Physical Exam Vitals and nursing note reviewed.  Constitutional:      General: She is not in acute distress.    Appearance: Normal appearance. She is not ill-appearing, toxic-appearing or diaphoretic.  HENT:     Head: Normocephalic and atraumatic.  Eyes:     General:        Right eye: No discharge.        Left eye: No discharge.  Cardiovascular:     Rate and Rhythm: Normal rate and regular rhythm.     Pulses:  Normal pulses.  Pulmonary:     Effort: Pulmonary effort is normal. No respiratory distress.     Breath sounds: No wheezing or rales.  Abdominal:     General: Bowel sounds are normal. There is no distension.     Palpations: Abdomen is soft.  Tenderness: There is no abdominal tenderness. There is no guarding.  Skin:    Capillary Refill: Capillary refill takes less than 2 seconds.     Comments: Right ant chest wall port. With normal 10 day post-op erythema. No purulent drainage. No tenderness with palpation of port. See picture.  Neurological:     General: No focal deficit present.     Mental Status: She is alert and oriented to person, place, and time.  Psychiatric:        Mood and Affect: Mood is anxious.     Comments: Almost in tears      Labs on Admission: I have personally reviewed following labs and imaging studies  CBC: Recent Labs  Lab 12/04/21 1030 12/07/21 2251  WBC 8.0 5.6  NEUTROABS 6.5 3.6  HGB 10.9* 10.9*  HCT 31.4* 32.8*  MCV 82.6 85.2  PLT 112* 106*   Basic Metabolic Panel: Recent Labs  Lab 12/04/21 1030 12/07/21 2251  NA 140 137  K 3.8 3.8  CL 107 104  CO2 28 26  GLUCOSE 126* 132*  BUN 11 17  CREATININE 0.73 0.81  CALCIUM 8.9 9.4   GFR: Estimated Creatinine Clearance: 119 mL/min (by C-G formula based on SCr of 0.81 mg/dL). Liver Function Tests: Recent Labs  Lab 12/04/21 1030 12/07/21 2251  AST 11* 20  ALT 13 26  ALKPHOS 122 105  BILITOT 0.4 0.6  PROT 6.8 7.7  ALBUMIN 4.0 4.1   No results for input(s): LIPASE, AMYLASE in the last 168 hours. No results for input(s): AMMONIA in the last 168 hours. Coagulation Profile: Recent Labs  Lab 12/07/21 2335  INR 1.1   Cardiac Enzymes: No results for input(s): CKTOTAL, CKMB, CKMBINDEX, TROPONINI in the last 168 hours. BNP (last 3 results) No results for input(s): PROBNP in the last 8760 hours. HbA1C: No results for input(s): HGBA1C in the last 72 hours. CBG: Recent Labs  Lab  12/07/21 2244  GLUCAP 118*   Lipid Profile: No results for input(s): CHOL, HDL, LDLCALC, TRIG, CHOLHDL, LDLDIRECT in the last 72 hours. Thyroid Function Tests: No results for input(s): TSH, T4TOTAL, FREET4, T3FREE, THYROIDAB in the last 72 hours. Anemia Panel: No results for input(s): VITAMINB12, FOLATE, FERRITIN, TIBC, IRON, RETICCTPCT in the last 72 hours. Urine analysis:    Component Value Date/Time   COLORURINE STRAW (A) 12/07/2021 2251   APPEARANCEUR CLEAR 12/07/2021 2251   LABSPEC 1.006 12/07/2021 2251   PHURINE 6.0 12/07/2021 2251   GLUCOSEU NEGATIVE 12/07/2021 2251   HGBUR NEGATIVE 12/07/2021 2251   BILIRUBINUR NEGATIVE 12/07/2021 2251   KETONESUR NEGATIVE 12/07/2021 2251   PROTEINUR NEGATIVE 12/07/2021 2251   NITRITE NEGATIVE 12/07/2021 2251   LEUKOCYTESUR MODERATE (A) 12/07/2021 2251    Radiological Exams on Admission: I have personally reviewed images DG Lumbar Spine Complete  Result Date: 12/07/2021 CLINICAL DATA:  66 year old female with low back pain upon waking. No known injury. Breast cancer. EXAM: LUMBAR SPINE - COMPLETE 4+ VIEW COMPARISON:  Staging CT Chest, Abdomen, and Pelvis 11/23/2021. FINDINGS: Normal lumbar segmentation. Stable vertebral height and alignment. Moderate lumbar facet hypertrophy appears stable. Relatively preserved disc spaces. No acute osseous abnormality identified. Nonobstructed visible bowel gas pattern. IMPRESSION: Lower lumbar facet arthropathy stable from recent CT Abdomen and Pelvis. No acute osseous abnormality identified. Electronically Signed   By: Genevie Ann M.D.   On: 12/07/2021 08:29   DG Chest Port 1 View  Result Date: 12/07/2021 CLINICAL DATA:  Questionable sepsis.  Hypotension and dizziness. EXAM: PORTABLE CHEST  1 VIEW COMPARISON:  11/27/2021. FINDINGS: The heart size and mediastinal contours are stable. No consolidation, effusion, or pneumothorax. A stable chest port is noted on the right. No acute osseous abnormality. IMPRESSION:  Stable chest with no active disease. Electronically Signed   By: Brett Fairy M.D.   On: 12/07/2021 23:34    EKG: I have personally reviewed EKG: sinus tachycardia   Assessment/Plan Principal Problem:   Anxiety    Assessment and Plan: * Anxiety Patient currently having a lot of anxiety about her recent diagnosis of cancer, chemotherapy and future surgery.  She states that she has not been able to talk with anybody about her emotional feelings.  She states that she feels angry at God what happened to her.  She also feels like it is very unfair since she has been so consistent about getting her yearly mammograms, doing breast examination and just had a recent normal physical about 2 weeks prior to her cancer diagnosis.  She feels angry at the world and how unfair this is for her.  A port site is mildly erythematous but does not have any pain.  There is no purulence from the area.  The area around her left areola is actually less red than it was last week according the patient.  I do not think the patient has an active infection.  she denies any urinary symptoms.  I gave her the option of being observed overnight so the surgery to look at her port site but she feels that she is feeling better after being able to talk some of her emotional distress without me.  She does state that she has been quite anxious.  She states that she used to have a prescription for some Xanax but did not take it.  Prescription is very old now.  She normally does not take any medications for anxiety.  Offered to give her a small supply of Xanax to help her with her emotional state.  Patient feels comfortable going home.  I asked her to call the oncology navigator tomorrow so she could talk about some of her emotional needs.  Discussed this with the EDP.   Disposition Plan: discharge to home    Kristopher Oppenheim, DO [facility] 12/08/2021, 2:50 AM

## 2021-12-08 NOTE — Subjective & Objective (Signed)
CC: back pain HPI: 66 year old African-American female with a recent history of breast cancer who started treatment on February 8.  She just had a port placed February 6.  She states that her initial chemotherapy went fine.  She got granulocyte colony-stimulating factor 2 days later.  She states she was told to take 7 days worth of scheduled Tylenol along with 7 days worth of scheduled Claritin.  He states that in a follow-up appointment to 3 days ago she was told to go ahead and stop it.  Today after she stopped her Tylenol and Claritin started having pain.  This became worse in nature and she became quite anxious about it.  She states that because of her anxiety she got on her treadmill this evening to help walk off some of her nervous energy.  After she got done walking, she did not cooldown but sat down on a chair next to her treadmill.  Less than a minute after she had sat down and she remembered she had to go into her kitchen and got up.  She stood up suddenly and started feeling lightheaded.  This may have made her more anxious.  She called EMS.  Patient denies any recent fever, chills.  She denies any urinary symptoms.  She has had no dysuria.  Has had no vomiting.  She had no nausea.  Has had no cough.

## 2021-12-08 NOTE — ED Notes (Signed)
Paged Hospitalist for PA.

## 2021-12-08 NOTE — Assessment & Plan Note (Signed)
Patient currently having a lot of anxiety about her recent diagnosis of cancer, chemotherapy and future surgery.  She states that she has not been able to talk with anybody about her emotional feelings.  She states that she feels angry at God what happened to her.  She also feels like it is very unfair since she has been so consistent about getting her yearly mammograms, doing breast examination and just had a recent normal physical about 2 weeks prior to her cancer diagnosis.  She feels angry at the world and how unfair this is for her.  A port site is mildly erythematous but does not have any pain.  There is no purulence from the area.  The area around her left areola is actually less red than it was last week according the patient.  I do not think the patient has an active infection.  she denies any urinary symptoms.  I gave her the option of being observed overnight so the surgery to look at her port site but she feels that she is feeling better after being able to talk some of her emotional distress without me.  She does state that she has been quite anxious.  She states that she used to have a prescription for some Xanax but did not take it.  Prescription is very old now.  She normally does not take any medications for anxiety.  Offered to give her a small supply of Xanax to help her with her emotional state.  Patient feels comfortable going home.  I asked her to call the oncology navigator tomorrow so she could talk about some of her emotional needs.  Discussed this with the EDP.

## 2021-12-09 LAB — URINE CULTURE

## 2021-12-11 ENCOUNTER — Inpatient Hospital Stay: Payer: Commercial Managed Care - HMO

## 2021-12-11 ENCOUNTER — Inpatient Hospital Stay (HOSPITAL_BASED_OUTPATIENT_CLINIC_OR_DEPARTMENT_OTHER): Payer: Commercial Managed Care - HMO | Admitting: Hematology

## 2021-12-11 ENCOUNTER — Other Ambulatory Visit: Payer: Self-pay

## 2021-12-11 VITALS — BP 145/81 | HR 98 | Temp 98.5°F | Resp 19 | Wt 183.3 lb

## 2021-12-11 DIAGNOSIS — Z170 Estrogen receptor positive status [ER+]: Secondary | ICD-10-CM

## 2021-12-11 DIAGNOSIS — C50212 Malignant neoplasm of upper-inner quadrant of left female breast: Secondary | ICD-10-CM

## 2021-12-11 DIAGNOSIS — Z5111 Encounter for antineoplastic chemotherapy: Secondary | ICD-10-CM | POA: Diagnosis not present

## 2021-12-11 DIAGNOSIS — Z95828 Presence of other vascular implants and grafts: Secondary | ICD-10-CM

## 2021-12-11 LAB — CMP (CANCER CENTER ONLY)
ALT: 22 U/L (ref 0–44)
AST: 17 U/L (ref 15–41)
Albumin: 4.2 g/dL (ref 3.5–5.0)
Alkaline Phosphatase: 114 U/L (ref 38–126)
Anion gap: 7 (ref 5–15)
BUN: 14 mg/dL (ref 8–23)
CO2: 28 mmol/L (ref 22–32)
Calcium: 9.1 mg/dL (ref 8.9–10.3)
Chloride: 107 mmol/L (ref 98–111)
Creatinine: 0.75 mg/dL (ref 0.44–1.00)
GFR, Estimated: >60 mL/min (ref >60)
Glucose, Bld: 111 mg/dL — ABNORMAL HIGH (ref 70–99)
Potassium: 3.7 mmol/L (ref 3.5–5.1)
Sodium: 142 mmol/L (ref 135–145)
Total Bilirubin: 0.3 mg/dL (ref 0.3–1.2)
Total Protein: 7 g/dL (ref 6.5–8.1)

## 2021-12-11 LAB — CBC WITH DIFFERENTIAL (CANCER CENTER ONLY)
Abs Immature Granulocytes: 1.84 10*3/uL — ABNORMAL HIGH (ref 0.00–0.07)
Basophils Absolute: 0.1 10*3/uL (ref 0.0–0.1)
Basophils Relative: 1 %
Eosinophils Absolute: 0.2 10*3/uL (ref 0.0–0.5)
Eosinophils Relative: 2 %
HCT: 32.1 % — ABNORMAL LOW (ref 36.0–46.0)
Hemoglobin: 10.6 g/dL — ABNORMAL LOW (ref 12.0–15.0)
Immature Granulocytes: 14 %
Lymphocytes Relative: 11 %
Lymphs Abs: 1.5 10*3/uL (ref 0.7–4.0)
MCH: 28 pg (ref 26.0–34.0)
MCHC: 33 g/dL (ref 30.0–36.0)
MCV: 84.9 fL (ref 80.0–100.0)
Monocytes Absolute: 0.8 10*3/uL (ref 0.1–1.0)
Monocytes Relative: 6 %
Neutro Abs: 8.7 10*3/uL — ABNORMAL HIGH (ref 1.7–7.7)
Neutrophils Relative %: 66 %
Platelet Count: 150 10*3/uL (ref 150–400)
RBC: 3.78 MIL/uL — ABNORMAL LOW (ref 3.87–5.11)
RDW: 14.2 % (ref 11.5–15.5)
WBC Count: 13.1 10*3/uL — ABNORMAL HIGH (ref 4.0–10.5)
nRBC: 0.4 % — ABNORMAL HIGH (ref 0.0–0.2)

## 2021-12-11 MED ORDER — DOXORUBICIN HCL CHEMO IV INJECTION 2 MG/ML
60.0000 mg/m2 | Freq: Once | INTRAVENOUS | Status: AC
  Administered 2021-12-11: 118 mg via INTRAVENOUS
  Filled 2021-12-11: qty 59

## 2021-12-11 MED ORDER — SODIUM CHLORIDE 0.9 % IV SOLN
600.0000 mg/m2 | Freq: Once | INTRAVENOUS | Status: AC
  Administered 2021-12-11: 1180 mg via INTRAVENOUS
  Filled 2021-12-11: qty 59

## 2021-12-11 MED ORDER — SODIUM CHLORIDE 0.9 % IV SOLN
10.0000 mg | Freq: Once | INTRAVENOUS | Status: AC
  Administered 2021-12-11: 10 mg via INTRAVENOUS
  Filled 2021-12-11: qty 10

## 2021-12-11 MED ORDER — SODIUM CHLORIDE 0.9% FLUSH
10.0000 mL | INTRAVENOUS | Status: DC | PRN
  Administered 2021-12-11: 10 mL

## 2021-12-11 MED ORDER — HEPARIN SOD (PORK) LOCK FLUSH 100 UNIT/ML IV SOLN
500.0000 [IU] | Freq: Once | INTRAVENOUS | Status: AC | PRN
  Administered 2021-12-11: 500 [IU]

## 2021-12-11 MED ORDER — SODIUM CHLORIDE 0.9 % IV SOLN
150.0000 mg | Freq: Once | INTRAVENOUS | Status: AC
  Administered 2021-12-11: 150 mg via INTRAVENOUS
  Filled 2021-12-11: qty 150

## 2021-12-11 MED ORDER — PALONOSETRON HCL INJECTION 0.25 MG/5ML
0.2500 mg | Freq: Once | INTRAVENOUS | Status: AC
  Administered 2021-12-11: 0.25 mg via INTRAVENOUS
  Filled 2021-12-11: qty 5

## 2021-12-11 MED ORDER — SODIUM CHLORIDE 0.9 % IV SOLN: Freq: Once | INTRAVENOUS | Status: AC

## 2021-12-11 MED ORDER — SODIUM CHLORIDE 0.9% FLUSH
10.0000 mL | Freq: Once | INTRAVENOUS | Status: AC
  Administered 2021-12-11: 10 mL

## 2021-12-11 NOTE — Progress Notes (Signed)
[place]   Telephone:([phone]) [phone] Fax:([phone]) [phone]   Clinic Follow up Note   Patient Care Team: Everardo Beals, NP as PCP - General Erroll Luna, MD as Consulting Physician (General Surgery) Truitt Merle, MD as Consulting Physician (Hematology) Gery Pray, MD as Consulting Physician (Radiation Oncology) [name], RN as Oncology Nurse Navigator [name], RN as Oncology Nurse Navigator  Date of Service:  12/11/2021  CHIEF COMPLAINT: f/u of left breast cancer  CURRENT THERAPY:  Neoadjuvant AC starting 11/28/21  ASSESSMENT & PLAN:  Cassandra Brennan is a 66 y.o. female with   1. 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 B/l diagnostic MM and left [place] 10/19/21 showed: 2 cm mass at 10 o'clock; 5 mm indeterminate satellite mass at 12 o'clock; palpable left axilla mass corresponds with large lymph node. Biopsy 10/27/21 confirmed invasive ductal carcinoma with necrosis to both 10 o'clock and lymph node, grade 3. ER weakly positive (30%). -breast MRI 11/15/21 shows the biopsy proven malignancy in the upper inner left breat measuring 1.9 cm, a biopsy proven malignant 4.3 cm left axillary LN and 2 other abnormal LNs measuring 1.3 and 1.5 cm, and a 1.3 liver mass likely representing a cyst.  -CT CAP 11/23/21 shows known left breast cancer and bulky axillary adenopathy, a liver cyst, and tiny indeterminate liver nodules too small to characterize. We will monitor these in the future. No evidence of distant metastasis.  -Bone scan is negative for osseous metastasis.  -Baseline echo is normal, LVEF 55-60%. -Left breast mass in the upper inner quadrant measured 2 cm and 2 palpable lymph nodes largest measuring 4.5 cm on 2/7 the day she began neoadjuvant chemo, for reference -She began neoadjuvant AC with cycle 11/28/21, tolerated well with mild  fatigue and nausea. We reviewed nausea management today. She also experienced bone pain from the G-CSF injection. I advised her to take claritin and tylenol.   2.  Hypertension and anxiety -Continue medications. -Follow-up with primary care physician -Improved today and patient had not taken antihypertensives   PLAN: -proceed with C2 AC today with Ziextenzo at home tomorrow after 2pm(will be given by home care nurse) -nutrition class 2/23 -lab, flush, f/u, and AC as scheduled in 2 and 4 weeks   No problem-specific Assessment & Plan notes found for this encounter.   SUMMARY OF ONCOLOGIC HISTORY: Oncology History Overview Note   Cancer Staging  Malignant neoplasm of upper-inner quadrant of left breast in female, estrogen receptor positive ([place]) Staging form: Breast, AJCC 8th Edition - Clinical stage from 10/27/2021: Stage IIB (cT1c, cN1, cM0, G3, ER+, PR-, HER2-) - Signed by Truitt Merle, MD on 11/07/2021    Malignant neoplasm of upper-inner quadrant of left breast in female, estrogen receptor positive ([place])  10/19/2021 Mammogram   CLINICAL DATA:  Patient presents for palpable left axillary abnormality.   EXAM: DIGITAL DIAGNOSTIC BILATERAL MAMMOGRAM WITH TOMOSYNTHESIS AND CAD; ULTRASOUND LEFT BREAST LIMITED  IMPRESSION: Suspicious left breast mass 10 o'clock position.   Adjacent suspicious satellite nodule left breast 12 o'clock position.   Palpable mass left axilla corresponds with a large lymph node.   10/27/2021 Initial Biopsy   Diagnosis 1. Breast, left, needle core biopsy, 12 o'clock, ribbon - FIBROADENOMA - NO MALIGNANCY IDENTIFIED 2. Breast, left, needle core biopsy, 10 o'clock, coil - INVASIVE DUCTAL CARCINOMA WITH NECROSIS - DUCTAL CARCINOMA IN SITU - SEE COMMENT  3. Lymph node, needle/core biopsy, left axilla, tribell - INVASIVE DUCTAL CARCINOMA WITH NECROSIS - NO NODAL TISSUE IDENTIFIED - SEE COMMENT Microscopic Comment 2. and 2. Based on the biopsy, the  carcinoma appears Nottingham grade 3 of 3 and measures 0.8 cm in greatest linear extent.  3. PROGNOSTIC INDICATORS Results: The tumor cells are NEGATIVE for Her2 (1+). Estrogen Receptor: 30%, POSITIVE, WEAK STAINING INTENSITY Progesterone Receptor: 0%, NEGATIVE Proliferation Marker Ki67: 60%   10/27/2021 Cancer Staging   Staging form: Breast, AJCC 8th Edition - Clinical stage from 10/27/2021: Stage IIB (cT1c, cN1, cM0, G3, ER+, PR-, HER2-) - Signed by [name], NP on 11/28/2021 Stage prefix: Initial diagnosis Histologic grading system: 3 grade system    11/06/2021 Initial Diagnosis   Malignant neoplasm of upper-inner quadrant of left breast in female, estrogen receptor positive ([place])   11/15/2021 Breast MRI   IMPRESSION: 1.9 cm mass in the upper-inner quadrant of the left breast corresponding with the known invasive ductal carcinoma. Enlarged left axillary lymph node corresponding with known metastatic disease.   RECOMMENDATION: Treatment planning of the known left breast cancer and axillary metastasis is recommended.   Additional imaging evaluation of the mass in the liver is recommended with CT with liver mass protocol or MRI.   11/22/2021 Imaging   Bone scan IMPRESSION: No scintigraphic evidence of bony metastatic disease. Degenerative changes as above.   11/22/2021 Echocardiogram   Baseline echo LVEF 55-60%, normal GLS -21.7%   11/23/2021 Imaging   CT CAP IMPRESSION: 1. Bulky lymphadenopathy in the left axilla including a 5.0 x 2.8 cm lymph node. 2. 15 mm nodule identified in the upper inner quadrant of the left breast. 3. No suspicious pulmonary nodule or mass. No evidence for metastatic disease in the abdomen or pelvis. 4. 11 mm cyst in the dome of the left liver. Adjacent tiny hypoattenuating lesions in the right liver are too small to characterize, but likely benign. Attention on follow-up recommended. 5. Prominent stool volume raises the question of clinical  constipation.   11/28/2021 -  Chemotherapy   Patient is on Treatment Plan : BREAST ADJUVANT DOSE DENSE AC q14d / PACLitaxel q7d      Genetic Testing   [facility] CancerNext was Negative. Report date is 11/26/2021.   The CancerNext gene panel offered by [employer] includes sequencing, rearrangement analysis, and RNA analysis for the following 36 genes:   APC, ATM, AXIN2, BARD1, BMPR1A, BRCA1, BRCA2, BRIP1, CDH1, CDK4, CDKN2A, CHEK2, DICER1, HOXB13, EPCAM, GREM1, MLH1, MSH2, MSH3, MSH6, MUTYH, NBN, NF1, NTHL1, PALB2, PMS2, POLD1, POLE, PTEN, RAD51C, RAD51D, RECQL, SMAD4, SMARCA4, STK11, and TP53.       INTERVAL HISTORY:  Cassandra Brennan is here for a follow up of breast cancer. She was last seen by NP Lacie on 12/04/21. She presents to the clinic alone. She reports she tolerated cycle 1 well overall. She does report she began to experience low back pain. She notes she forgot she was told she would experience bone pain from the injection.   All other systems were reviewed with the patient and are negative.  MEDICAL HISTORY:  Past Medical History:  Diagnosis Date   Anxiety    Arthritis    right knee    GERD (gastroesophageal reflux disease)    Hyperlipidemia    Hypertension    Positive colorectal cancer screening using Cologuard test     SURGICAL HISTORY: Past Surgical History:  Procedure Laterality Date   BREAST CYST EXCISION Right    pt  stated in [place]     other GI testing     unsure but had to drink a chalky drink    PORTACATH PLACEMENT N/A 11/27/2021   Procedure: INSERTION PORT-A-CATH;  Surgeon: Erroll Luna, MD;  Location: [facility];  Service: General;  Laterality: N/A;   TONSILLECTOMY     TUBAL LIGATION      I have reviewed the social history and family history with the patient and they are unchanged from previous note.  ALLERGIES:  is allergic to prednisone, rosuvastatin, other, robitussin (alcohol free) [guaifenesin], and sulfa antibiotics.  MEDICATIONS:   Current Outpatient Medications  Medication Sig Dispense Refill   acetaminophen (TYLENOL) 500 MG tablet Take 500 mg by mouth every 8 (eight) hours as needed for moderate pain.     ALPRAZolam (XANAX) 0.25 MG tablet Take 1 tablet (0.25 mg total) by mouth 3 (three) times daily as needed for up to 14 days for anxiety. 14 tablet 0   lidocaine-prilocaine (EMLA) cream Apply to affected area once 30 g 3   metoprolol succinate (TOPROL-XL) 100 MG 24 hr tablet Take 1 tablet (100 mg total) by mouth daily. Take with or immediately following a meal. 30 tablet 0   ondansetron (ZOFRAN) 8 MG tablet Take 1 tablet (8 mg total) by mouth 2 (two) times daily as needed. Start on the third day after chemotherapy. 30 tablet 1   oxyCODONE (OXY IR/ROXICODONE) 5 MG immediate release tablet Take 1 tablet (5 mg total) by mouth every 6 (six) hours as needed for severe pain. 15 tablet 0   pravastatin (PRAVACHOL) 20 MG tablet Take 1 tablet (20 mg total) by mouth daily. 30 tablet 0   prochlorperazine (COMPAZINE) 10 MG tablet Take 1 tablet (10 mg total) by mouth every 6 (six) hours as needed (Nausea or vomiting). 30 tablet 1   No current facility-administered medications for this visit.    PHYSICAL EXAMINATION: ECOG PERFORMANCE STATUS: 1 - Symptomatic but completely ambulatory  Vitals:   12/11/21 1023  BP: (!) 145/81  Pulse: 98  Resp: 19  Temp: 98.5 F (36.9 C)  SpO2: 100%   Wt Readings from Last 3 Encounters:  12/11/21 183 lb 4.8 oz (83.1 kg)  12/07/21 (!) 403 lb 7.1 oz (183 kg)  12/04/21 183 lb 2 oz (83.1 kg)     GENERAL:alert, no distress and comfortable SKIN: skin color normal, no rashes or significant lesions EYES: normal, Conjunctiva are pink and non-injected, sclera clear  NEURO: alert & oriented x 3 with fluent speech  LABORATORY DATA:  I have reviewed the data as listed CBC Latest Ref Rng & Units 12/11/2021 12/07/2021 12/04/2021  WBC 4.0 - 10.5 K/uL 13.1(H) 5.6 8.0  Hemoglobin 12.0 - 15.0 g/dL 10.6(L)  10.9(L) 10.9(L)  Hematocrit 36.0 - 46.0 % 32.1(L) 32.8(L) 31.4(L)  Platelets 150 - 400 K/uL 150 112(L) 112(L)     CMP Latest Ref Rng & Units 12/11/2021 12/07/2021 12/04/2021  Glucose 70 - 99 mg/dL 111(H) 132(H) 126(H)  BUN 8 - 23 mg/dL '14 17 11  ' Creatinine 0.44 - 1.00 mg/dL 0.75 0.81 0.73  Sodium 135 - 145 mmol/L 142 137 140  Potassium 3.5 - 5.1 mmol/L 3.7 3.8 3.8  Chloride 98 - 111 mmol/L 107 104 107  CO2 22 - 32 mmol/L '28 26 28  ' Calcium 8.9 - 10.3 mg/dL 9.1 9.4 8.9  Total Protein 6.5 - 8.1 g/dL 7.0 7.7 6.8  Total Bilirubin 0.3 - 1.2 mg/dL 0.3 0.6 0.4  Alkaline Phos  38 - 126 U/L 114 105 122  AST 15 - 41 U/L 17 20 11(L)  ALT 0 - 44 U/L '22 26 13      ' RADIOGRAPHIC STUDIES: I have personally reviewed the radiological images as listed and agreed with the findings in the report. No results found.    No orders of the defined types were placed in this encounter.  All questions were answered. The patient knows to call the clinic with any problems, questions or concerns. No barriers to learning was detected. The total time spent in the appointment was 30 minutes.     Truitt Merle, MD 12/11/2021   I, Wilburn Mylar, am acting as scribe for Truitt Merle, MD.   I have reviewed the above documentation for accuracy and completeness, and I agree with the above.

## 2021-12-11 NOTE — Patient Instructions (Signed)
[facility] MEDICAL [facility]COLOG[facility]  Discharge Instructions: °Thank you for choosing [facility] to provide your oncology and hematology care.  ° °If you have a lab appointment with the Cancer Center, please go directly to the Cancer Center and check in at the registration area. °  °Wear comfortable clothing and clothing appropriate for easy access to any Portacath or PICC line.  ° °We strive to give you quality time with your provider. You may need to reschedule your appointment if you arrive late (15 or more minutes).  Arriving late affects you and other patients whose appointments are after yours.  Also, if you miss three or more appointments without notifying the office, you may be dismissed from the clinic at the provider’s discretion.    °  °For prescription refill requests, have your pharmacy contact our office and allow 72 hours for refills to be completed.   ° °Today you received the following chemotherapy and/or immunotherapy agents: doxorubicin and  cyclophosphamide   °  °To help prevent nausea and vomiting after your treatment, we encourage you to take your nausea medication as directed. ° °BELOW ARE SYMPTOMS THAT SHOULD BE REPORTED IMMEDIATELY: °*FEVER GREATER THAN 100.4 F (38 °C) OR HIGHER °*CHILLS OR SWEATING °*NAUSEA AND VOMITING THAT IS NOT CONTROLLED WITH YOUR NAUSEA MEDICATION °*UNUSUAL SHORTNESS OF BREATH °*UNUSUAL BRUISING OR BLEEDING °*URINARY PROBLEMS (pain or burning when urinating, or frequent urination) °*BOWEL PROBLEMS (unusual diarrhea, constipation, pain near the anus) °TENDERNESS IN MOUTH AND THROAT WITH OR WITHOUT PRESENCE OF ULCERS (sore throat, sores in mouth, or a toothache) °UNUSUAL RASH, SWELLING OR PAIN  °UNUSUAL VAGINAL DISCHARGE OR ITCHING  ° °Items with * indicate a potential emergency and should be followed up as soon as possible or go to the Emergency Department if any problems should occur. ° °Please show the CHEMOTHERAPY ALERT CARD or IMMUNOTHERAPY  ALERT CARD at check-in to the Emergency Department and triage nurse. ° °Should you have questions after your visit or need to cancel or reschedule your appointment, please contact [facility]  Dept: [phone]  and follow the prompts.  Office hours are 8:00 a.m. to 4:30 p.m. Monday - Friday. Please note that voicemails left after 4:00 p.m. may not be returned until the following business day.  We are closed weekends and major holidays. You have access to a nurse at all times for urgent questions. Please call the main number to the clinic Dept: [phone] and follow the prompts. ° ° °For any non-urgent questions, you may also contact your provider using MyChart. We now offer e-Visits for anyone 18 and older to request care online for non-urgent symptoms. For details visit [email or web address]. °  °Also download the MyChart app! Go to the app store, search "MyChart", open the app, select [facility], and log in with your MyChart username and password. ° °Due to Covid, a mask is required upon entering the hospital/clinic. If you do not have a mask, one will be given to you upon arrival. For doctor visits, patients may have 1 support person aged 18 or older with them. For treatment visits, patients cannot have anyone with them due to current Covid guidelines and our immunocompromised population.  ° °

## 2021-12-11 NOTE — Research (Signed)
[id number] - TREATMENT OF REFRACTORY NAUSEA  Saw patient in infusion. Provided another SASE and requested she send back completed questionnaires. She reports she has not been feeling well but has been much better since ED visit Thursday night & taking new medication for anxiety.  Marjie Skiff Celene Pippins, RN, BSN, [facility]   Her   [facility] Clinical Research Nurse [facility] Direct Dial [phone]   Pager [phone] 12/11/2021 1:21 PM

## 2021-12-13 ENCOUNTER — Ambulatory Visit: Payer: Managed Care, Other (non HMO)

## 2021-12-13 LAB — CULTURE, BLOOD (ROUTINE X 2)
Culture: NO GROWTH
Culture: NO GROWTH
Special Requests: ADEQUATE
Special Requests: ADEQUATE

## 2021-12-14 ENCOUNTER — Inpatient Hospital Stay: Payer: Commercial Managed Care - HMO | Admitting: Dietician

## 2021-12-14 ENCOUNTER — Other Ambulatory Visit: Payer: Self-pay

## 2021-12-14 NOTE — Progress Notes (Signed)
Patient attended Nutrition 101 Class on 12/14/21.   Cassandra Brennan, Cassandra Brennan

## 2021-12-18 ENCOUNTER — Ambulatory Visit: Payer: Managed Care, Other (non HMO) | Admitting: Hematology

## 2021-12-18 ENCOUNTER — Other Ambulatory Visit: Payer: Managed Care, Other (non HMO)

## 2021-12-18 ENCOUNTER — Ambulatory Visit: Payer: Managed Care, Other (non HMO)

## 2021-12-20 ENCOUNTER — Ambulatory Visit: Payer: Managed Care, Other (non HMO)

## 2021-12-22 MED FILL — Dexamethasone Sodium Phosphate Inj 100 MG/10ML: INTRAMUSCULAR | Qty: 1 | Status: AC

## 2021-12-22 MED FILL — Fosaprepitant Dimeglumine For IV Infusion 150 MG (Base Eq): INTRAVENOUS | Qty: 5 | Status: AC

## 2021-12-24 NOTE — Progress Notes (Signed)
[place]   [phone] Fax:([phone]   Clinic Follow up Note   Patient Care Team: Everardo Beals, NP as PCP - General Erroll Luna, MD as Consulting Physician (General Surgery) Truitt Merle, MD as Consulting Physician (Hematology) Gery Pray, MD as Consulting Physician (Radiation Oncology) [name], RN as Oncology Nurse Navigator [name], RN as Oncology Nurse Navigator 12/25/2021  CHIEF COMPLAINT: Follow up left breast cancer   SUMMARY OF ONCOLOGIC HISTORY: Oncology History Overview Note   Cancer Staging  Malignant neoplasm of upper-inner quadrant of left breast in female, estrogen receptor positive ([place]) Staging form: Breast, AJCC 8th Edition - Clinical stage from 10/27/2021: Stage IIB (cT1c, cN1, cM0, G3, ER+, PR-, HER2-) - Signed by Truitt Merle, MD on 11/07/2021    Malignant neoplasm of upper-inner quadrant of left breast in female, estrogen receptor positive ([place])  10/19/2021 Mammogram   CLINICAL DATA:  Patient presents for palpable left axillary abnormality.   EXAM: DIGITAL DIAGNOSTIC BILATERAL MAMMOGRAM WITH TOMOSYNTHESIS AND CAD; ULTRASOUND LEFT BREAST LIMITED  IMPRESSION: Suspicious left breast mass 10 o'clock position.   Adjacent suspicious satellite nodule left breast 12 o'clock position.   Palpable mass left axilla corresponds with a large lymph node.   10/27/2021 Initial Biopsy   Diagnosis 1. Breast, left, needle core biopsy, 12 o'clock, ribbon - FIBROADENOMA - NO MALIGNANCY IDENTIFIED 2. Breast, left, needle core biopsy, 10 o'clock, coil - INVASIVE DUCTAL CARCINOMA WITH NECROSIS - DUCTAL CARCINOMA IN SITU - SEE COMMENT 3. Lymph node, needle/core biopsy, left axilla, tribell - INVASIVE DUCTAL CARCINOMA WITH NECROSIS - NO NODAL TISSUE IDENTIFIED - SEE COMMENT Microscopic Comment 2. and 2. Based on the biopsy, the carcinoma appears Nottingham grade 3 of 3 and measures 0.8 cm in greatest  linear extent.  3. PROGNOSTIC INDICATORS Results: The tumor cells are NEGATIVE for Her2 (1+). Estrogen Receptor: 30%, POSITIVE, WEAK STAINING INTENSITY Progesterone Receptor: 0%, NEGATIVE Proliferation Marker Ki67: 60%   10/27/2021 Cancer Staging   Staging form: Breast, AJCC 8th Edition - Clinical stage from 10/27/2021: Stage IIB (cT1c, cN1, cM0, G3, ER+, PR-, HER2-) - Signed by [name], NP on 11/28/2021 Stage prefix: Initial diagnosis Histologic grading system: 3 grade system    11/06/2021 Initial Diagnosis   Malignant neoplasm of upper-inner quadrant of left breast in female, estrogen receptor positive ([place])   11/15/2021 Breast MRI   IMPRESSION: 1.9 cm mass in the upper-inner quadrant of the left breast corresponding with the known invasive ductal carcinoma. Enlarged left axillary lymph node corresponding with known metastatic disease.   RECOMMENDATION: Treatment planning of the known left breast cancer and axillary metastasis is recommended.   Additional imaging evaluation of the mass in the liver is recommended with CT with liver mass protocol or MRI.   11/22/2021 Imaging   Bone scan IMPRESSION: No scintigraphic evidence of bony metastatic disease. Degenerative changes as above.   11/22/2021 Echocardiogram   Baseline echo LVEF 55-60%, normal GLS -21.7%   11/23/2021 Imaging   CT CAP IMPRESSION: 1. Bulky lymphadenopathy in the left axilla including a 5.0 x 2.8 cm lymph node. 2. 15 mm nodule identified in the upper inner quadrant of the left breast. 3. No suspicious pulmonary nodule or mass. No evidence for metastatic disease in the abdomen or pelvis. 4. 11 mm cyst in the dome of the left liver. Adjacent tiny hypoattenuating lesions in the right liver are too small to characterize, but likely benign. Attention on follow-up recommended. 5. Prominent stool volume raises  the question of clinical constipation.   11/28/2021 -  Chemotherapy   Patient is on Treatment Plan : BREAST  ADJUVANT DOSE DENSE AC q14d / PACLitaxel q7d      Genetic Testing   [facility] CancerNext was Negative. Report date is 11/26/2021.   The CancerNext gene panel offered by [employer] includes sequencing, rearrangement analysis, and RNA analysis for the following 36 genes:   APC, ATM, AXIN2, BARD1, BMPR1A, BRCA1, BRCA2, BRIP1, CDH1, CDK4, CDKN2A, CHEK2, DICER1, HOXB13, EPCAM, GREM1, MLH1, MSH2, MSH3, MSH6, MUTYH, NBN, NF1, NTHL1, PALB2, PMS2, POLD1, POLE, PTEN, RAD51C, RAD51D, RECQL, SMAD4, SMARCA4, STK11, and TP53.      CURRENT THERAPY: Neoadjuvant chemo AC-T, starting 11/28/21  INTERVAL HISTORY: Cassandra Brennan returns for follow up as scheduled. Last seen 12/11/21 and completed cycle 2 AC with gcsf next day at home.  Cycle 2 went "really good this time."  She took Claritin and Tylenol for 6 days, had no bone pain.  She is tired but remains up and out of bed, functional at home.  Eating and drinking well.  Bowels moving well.  Denies fever, chills, cough, chest pain, dyspnea.  She cannot feel the lump under her arm anymore.  She has some allergies today, itchy eyes and nose is running.  She is also little bit nauseous.  All other systems were reviewed with the patient and are negative.  MEDICAL HISTORY:  Past Medical History:  Diagnosis Date   Anxiety    Arthritis    right knee    GERD (gastroesophageal reflux disease)    Hyperlipidemia    Hypertension    Positive colorectal cancer screening using Cologuard test     SURGICAL HISTORY: Past Surgical History:  Procedure Laterality Date   BREAST CYST EXCISION Right    pt stated in [facility]     other GI testing     unsure but had to drink a chalky drink    PORTACATH PLACEMENT N/A 11/27/2021   Procedure: INSERTION PORT-A-CATH;  Surgeon: Erroll Luna, MD;  Location: [facility];  Service: General;  Laterality: N/A;   TONSILLECTOMY     TUBAL LIGATION      I have reviewed the social history and family history with the patient and they  are unchanged from previous note.  ALLERGIES:  is allergic to prednisone, rosuvastatin, other, robitussin (alcohol free) [guaifenesin], and sulfa antibiotics.  MEDICATIONS:  Current Outpatient Medications  Medication Sig Dispense Refill   acetaminophen (TYLENOL) 500 MG tablet Take 500 mg by mouth every 8 (eight) hours as needed for moderate pain.     lidocaine-prilocaine (EMLA) cream Apply to affected area once 30 g 3   metoprolol succinate (TOPROL-XL) 100 MG 24 hr tablet Take 1 tablet (100 mg total) by mouth daily. Take with or immediately following a meal. 30 tablet 0   ondansetron (ZOFRAN) 8 MG tablet Take 1 tablet (8 mg total) by mouth 2 (two) times daily as needed. Start on the third day after chemotherapy. 30 tablet 1   oxyCODONE (OXY IR/ROXICODONE) 5 MG immediate release tablet Take 1 tablet (5 mg total) by mouth every 6 (six) hours as needed for severe pain. 15 tablet 0   pravastatin (PRAVACHOL) 20 MG tablet Take 1 tablet (20 mg total) by mouth daily. 30 tablet 0   prochlorperazine (COMPAZINE) 10 MG tablet Take 1 tablet (10 mg total) by mouth every 6 (six) hours as needed (Nausea or vomiting). 30 tablet 1   No current facility-administered medications for  this visit.   Facility-Administered Medications Ordered in Other Visits  Medication Dose Route Frequency Provider Last Rate Last Admin   cyclophosphamide (CYTOXAN) 1,180 mg in sodium chloride 0.9 % 250 mL chemo infusion  600 mg/m2 (Treatment Plan Recorded) Intravenous Once Truitt Merle, MD       DOXOrubicin (ADRIAMYCIN) chemo injection 118 mg  60 mg/m2 (Treatment Plan Recorded) Intravenous Once Truitt Merle, MD       heparin lock flush 100 unit/mL  500 Units Intracatheter Once PRN Truitt Merle, MD       sodium chloride flush (NS) 0.9 % injection 10 mL  10 mL Intracatheter PRN Truitt Merle, MD        PHYSICAL EXAMINATION: ECOG PERFORMANCE STATUS: 1 - Symptomatic but completely ambulatory  Vitals:   12/25/21 1007  BP: (!) 156/83  Pulse: 98   Resp: 17  Temp: 99 F (37.2 C)  SpO2: 100%   Filed Weights   12/25/21 1007  Weight: 186 lb 1.6 oz (84.4 kg)    GENERAL:alert, no distress and comfortable SKIN: No rash NECK: Without mass LYMPH:  no palpable cervical or supraclavicular lymphadenopathy LUNGS:  normal breathing effort HEART: no lower extremity edema NEURO: alert & oriented x 3 with fluent speech, no focal motor deficits Left breast exam: 1 left axillary lymph node is deep, palpable but smaller.  The palpable mass in the upper inner quadrant is no longer appreciable on today's exam PAC covered with gauze  LABORATORY DATA:  I have reviewed the data as listed CBC Latest Ref Rng & Units 12/25/2021 12/11/2021 12/07/2021  WBC 4.0 - 10.5 K/uL 20.2(H) 13.1(H) 5.6  Hemoglobin 12.0 - 15.0 g/dL 9.2(L) 10.6(L) 10.9(L)  Hematocrit 36.0 - 46.0 % 27.9(L) 32.1(L) 32.8(L)  Platelets 150 - 400 K/uL 241 150 112(L)     CMP Latest Ref Rng & Units 12/25/2021 12/11/2021 12/07/2021  Glucose 70 - 99 mg/dL 128(H) 111(H) 132(H)  BUN 8 - 23 mg/dL _0 Creatinine 0.44 - 1.00 mg/dL 0.75 0.75 0.81  Sodium 135 - 145 mmol/L 141 142 137  Potassium 3.5 - 5.1 mmol/L 3.8 3.7 3.8  Chloride 98 - 111 mmol/L 108 107 104  CO2 22 - 32 mmol/L _1 Calcium 8.9 - 10.3 mg/dL 9.1 9.1 9.4  Total Protein 6.5 - 8.1 g/dL 6.8 7.0 7.7  Total Bilirubin 0.3 - 1.2 mg/dL 0.3 0.3 0.6  Alkaline Phos 38 - 126 U/L 134(H) 114 105  AST 15 - 41 U/L _2 ALT 0 - 44 U/L _3 RADIOGRAPHIC STUDIES: I have personally reviewed the radiological images as listed and agreed with the findings in the report. No results found.   ASSESSMENT & PLAN:  Cassandra Brennan is a 66 y.o. postmenopausal female    1. 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 B/l diagnostic MM and left [place] 10/19/21 showed: 2 cm mass at 10 o'clock; 5 mm  indeterminate satellite mass at 12 o'clock; palpable left axilla mass corresponds with large lymph node. Biopsy 10/27/21 confirmed invasive ductal carcinoma with necrosis to both 10 o'clock and lymph node, grade 3. ER weakly positive (30%). 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  Neoadjuvant chemotherapy has been recommended.  She was seen by  Dr. Brantley Stage and will likely proceed with lumpectomy, SLNB and targeted lymph node dissection following chemo.  She would benefit from adjuvant radiation if she has lumpectomy. -breast MRI 11/15/2021 was previously reviewed, which shows the biopsy proven malignancy in the upper inner left breat measuring 1.5 x1.3 x1.9 cm, a biopsy proven malignant 4.3 cm left axillary LN and 2 other abnormal LNs measuring 1.3 and 1.5 cm, and a 1.3 liver mass likely representing a cyst.  -CT CAP 11/23/2021 was previously reviewed, which shows known left breast cancer and bulky axillary adenopathy, a liver cyst, and tiny indeterminate liver nodules too small to characterize. We will monitor these in the future. No evidence of distant metastasis.  -Bone scan is negative for osseous metastasis.  -Baseline echo is normal, LVEF 55-60%. -Left breast mass in the upper inner quadrant measured 2 cm and 2 palpable lymph nodes largest measuring 4.5 cm on 2/7 the day she began neoadjuvant chemo, for reference -She began neoadjuvant AC with cycle 09/27/22, tolerated well with mild fatigue and nausea.   -After cycle 2 the upper inner left breast mass is no longer appreciable and axillary adenopathy has improved.   2.  Hypertension and anxiety -Continue medications. -Follow-up with primary care physician -Improved today and patient had not taken antihypertensives  Disposition: Ms. Kayes appears stable.  She has completed 2 cycles neoadjuvant AC with G-CSF.  She tolerated last cycle  well, main side effects are fatigue and injection-related bone pain.  She is a little bit nauseous today, which may be anticipatory.  Side effects are well managed with supportive care at home.  She is able to recover and function well. The left upper inner breast mass is no longer appreciable and axillary adenopathy has improved on today's exam.  This is consistent with a clinical response to treatment.   Labs reviewed, adequate to proceed with C3 AC today as planned, gcsf at home 3/7 which is already arranged.   F/up in 2 weeks with final C4 AC.  She will then proceed to weekly Taxol x12.   All questions were answered. The patient knows to call the clinic with any problems, questions or concerns. No barriers to learning were detected.     [name], NP 12/25/21

## 2021-12-25 ENCOUNTER — Inpatient Hospital Stay (HOSPITAL_BASED_OUTPATIENT_CLINIC_OR_DEPARTMENT_OTHER): Payer: Managed Care, Other (non HMO) | Admitting: Nurse Practitioner

## 2021-12-25 ENCOUNTER — Other Ambulatory Visit: Payer: Self-pay

## 2021-12-25 ENCOUNTER — Encounter: Payer: Self-pay | Admitting: Nurse Practitioner

## 2021-12-25 ENCOUNTER — Inpatient Hospital Stay: Payer: Managed Care, Other (non HMO)

## 2021-12-25 ENCOUNTER — Inpatient Hospital Stay: Payer: Managed Care, Other (non HMO) | Attending: Hematology

## 2021-12-25 VITALS — BP 156/83 | HR 98 | Temp 99.0°F | Resp 17 | Wt 186.1 lb

## 2021-12-25 DIAGNOSIS — C50212 Malignant neoplasm of upper-inner quadrant of left female breast: Secondary | ICD-10-CM

## 2021-12-25 DIAGNOSIS — Z170 Estrogen receptor positive status [ER+]: Secondary | ICD-10-CM

## 2021-12-25 DIAGNOSIS — Z882 Allergy status to sulfonamides status: Secondary | ICD-10-CM | POA: Insufficient documentation

## 2021-12-25 DIAGNOSIS — K7689 Other specified diseases of liver: Secondary | ICD-10-CM | POA: Diagnosis not present

## 2021-12-25 DIAGNOSIS — F419 Anxiety disorder, unspecified: Secondary | ICD-10-CM | POA: Insufficient documentation

## 2021-12-25 DIAGNOSIS — H5789 Other specified disorders of eye and adnexa: Secondary | ICD-10-CM | POA: Diagnosis not present

## 2021-12-25 DIAGNOSIS — Z5111 Encounter for antineoplastic chemotherapy: Secondary | ICD-10-CM | POA: Insufficient documentation

## 2021-12-25 DIAGNOSIS — R5383 Other fatigue: Secondary | ICD-10-CM | POA: Diagnosis not present

## 2021-12-25 DIAGNOSIS — Z95828 Presence of other vascular implants and grafts: Secondary | ICD-10-CM

## 2021-12-25 DIAGNOSIS — I10 Essential (primary) hypertension: Secondary | ICD-10-CM | POA: Diagnosis not present

## 2021-12-25 DIAGNOSIS — D649 Anemia, unspecified: Secondary | ICD-10-CM | POA: Insufficient documentation

## 2021-12-25 DIAGNOSIS — Z888 Allergy status to other drugs, medicaments and biological substances status: Secondary | ICD-10-CM | POA: Diagnosis not present

## 2021-12-25 DIAGNOSIS — Z79899 Other long term (current) drug therapy: Secondary | ICD-10-CM | POA: Diagnosis not present

## 2021-12-25 DIAGNOSIS — R110 Nausea: Secondary | ICD-10-CM | POA: Insufficient documentation

## 2021-12-25 LAB — CMP (CANCER CENTER ONLY)
ALT: 23 U/L (ref 0–44)
AST: 16 U/L (ref 15–41)
Albumin: 4 g/dL (ref 3.5–5.0)
Alkaline Phosphatase: 134 U/L — ABNORMAL HIGH (ref 38–126)
Anion gap: 7 (ref 5–15)
BUN: 14 mg/dL (ref 8–23)
CO2: 26 mmol/L (ref 22–32)
Calcium: 9.1 mg/dL (ref 8.9–10.3)
Chloride: 108 mmol/L (ref 98–111)
Creatinine: 0.75 mg/dL (ref 0.44–1.00)
GFR, Estimated: >60 mL/min (ref >60)
Glucose, Bld: 128 mg/dL — ABNORMAL HIGH (ref 70–99)
Potassium: 3.8 mmol/L (ref 3.5–5.1)
Sodium: 141 mmol/L (ref 135–145)
Total Bilirubin: 0.3 mg/dL (ref 0.3–1.2)
Total Protein: 6.8 g/dL (ref 6.5–8.1)

## 2021-12-25 LAB — CBC WITH DIFFERENTIAL (CANCER CENTER ONLY)
Abs Immature Granulocytes: 3.53 10*3/uL — ABNORMAL HIGH (ref 0.00–0.07)
Basophils Absolute: 0.1 10*3/uL (ref 0.0–0.1)
Basophils Relative: 1 %
Eosinophils Absolute: 0.2 10*3/uL (ref 0.0–0.5)
Eosinophils Relative: 1 %
HCT: 27.9 % — ABNORMAL LOW (ref 36.0–46.0)
Hemoglobin: 9.2 g/dL — ABNORMAL LOW (ref 12.0–15.0)
Immature Granulocytes: 18 %
Lymphocytes Relative: 6 %
Lymphs Abs: 1.3 10*3/uL (ref 0.7–4.0)
MCH: 28.2 pg (ref 26.0–34.0)
MCHC: 33 g/dL (ref 30.0–36.0)
MCV: 85.6 fL (ref 80.0–100.0)
Monocytes Absolute: 1.1 10*3/uL — ABNORMAL HIGH (ref 0.1–1.0)
Monocytes Relative: 6 %
Neutro Abs: 13.9 10*3/uL — ABNORMAL HIGH (ref 1.7–7.7)
Neutrophils Relative %: 68 %
Platelet Count: 241 10*3/uL (ref 150–400)
RBC: 3.26 MIL/uL — ABNORMAL LOW (ref 3.87–5.11)
RDW: 15.8 % — ABNORMAL HIGH (ref 11.5–15.5)
Smear Review: NORMAL
WBC Count: 20.2 10*3/uL — ABNORMAL HIGH (ref 4.0–10.5)
nRBC: 0.4 % — ABNORMAL HIGH (ref 0.0–0.2)

## 2021-12-25 MED ORDER — DOXORUBICIN HCL CHEMO IV INJECTION 2 MG/ML
60.0000 mg/m2 | Freq: Once | INTRAVENOUS | Status: AC
  Administered 2021-12-25: 118 mg via INTRAVENOUS
  Filled 2021-12-25: qty 59

## 2021-12-25 MED ORDER — SODIUM CHLORIDE 0.9 % IV SOLN
10.0000 mg | Freq: Once | INTRAVENOUS | Status: AC
  Administered 2021-12-25: 10 mg via INTRAVENOUS
  Filled 2021-12-25: qty 10

## 2021-12-25 MED ORDER — SODIUM CHLORIDE 0.9 % IV SOLN: Freq: Once | INTRAVENOUS | Status: AC

## 2021-12-25 MED ORDER — PALONOSETRON HCL INJECTION 0.25 MG/5ML
0.2500 mg | Freq: Once | INTRAVENOUS | Status: AC
  Administered 2021-12-25: 0.25 mg via INTRAVENOUS
  Filled 2021-12-25: qty 5

## 2021-12-25 MED ORDER — SODIUM CHLORIDE 0.9% FLUSH
10.0000 mL | INTRAVENOUS | Status: DC | PRN
  Administered 2021-12-25: 10 mL

## 2021-12-25 MED ORDER — SODIUM CHLORIDE 0.9 % IV SOLN
600.0000 mg/m2 | Freq: Once | INTRAVENOUS | Status: AC
  Administered 2021-12-25: 1180 mg via INTRAVENOUS
  Filled 2021-12-25: qty 59

## 2021-12-25 MED ORDER — SODIUM CHLORIDE 0.9 % IV SOLN
150.0000 mg | Freq: Once | INTRAVENOUS | Status: AC
  Administered 2021-12-25: 150 mg via INTRAVENOUS
  Filled 2021-12-25: qty 150

## 2021-12-25 MED ORDER — SODIUM CHLORIDE 0.9% FLUSH
10.0000 mL | Freq: Once | INTRAVENOUS | Status: AC
  Administered 2021-12-25: 10 mL

## 2021-12-25 MED ORDER — HEPARIN SOD (PORK) LOCK FLUSH 100 UNIT/ML IV SOLN
500.0000 [IU] | Freq: Once | INTRAVENOUS | Status: AC | PRN
  Administered 2021-12-25: 500 [IU]

## 2021-12-25 NOTE — Patient Instructions (Signed)
[facility]  Discharge Instructions: ?Thank you for choosing [facility] to provide your oncology and hematology care.  ? ?If you have a lab appointment with the [facility], please go directly to the [facility] and check in at the registration area. ?  ?Wear comfortable clothing and clothing appropriate for easy access to any Portacath or PICC line.  ? ?We strive to give you quality time with your provider. You may need to reschedule your appointment if you arrive late (15 or more minutes).  Arriving late affects you and other patients whose appointments are after yours.  Also, if you miss three or more appointments without notifying the office, you may be dismissed from the clinic at the provider?s discretion.    ?  ?For prescription refill requests, have your pharmacy contact our office and allow 72 hours for refills to be completed.   ? ?Today you received the following chemotherapy and/or immunotherapy agents: Doxorubicin, Cytoxan.    ?  ?To help prevent nausea and vomiting after your treatment, we encourage you to take your nausea medication as directed. ? ?BELOW ARE SYMPTOMS THAT SHOULD BE REPORTED IMMEDIATELY: ?*FEVER GREATER THAN 100.4 F (38 ?C) OR HIGHER ?*CHILLS OR SWEATING ?*NAUSEA AND VOMITING THAT IS NOT CONTROLLED WITH YOUR NAUSEA MEDICATION ?*UNUSUAL SHORTNESS OF BREATH ?*UNUSUAL BRUISING OR BLEEDING ?*URINARY PROBLEMS (pain or burning when urinating, or frequent urination) ?*BOWEL PROBLEMS (unusual diarrhea, constipation, pain near the anus) ?TENDERNESS IN MOUTH AND THROAT WITH OR WITHOUT PRESENCE OF ULCERS (sore throat, sores in mouth, or a toothache) ?UNUSUAL RASH, SWELLING OR PAIN  ?UNUSUAL VAGINAL DISCHARGE OR ITCHING  ? ?Items with * indicate a potential emergency and should be followed up as soon as possible or go to the Emergency Department if any problems should occur. ? ?Please show the CHEMOTHERAPY ALERT CARD or IMMUNOTHERAPY ALERT CARD at  check-in to the Emergency Department and triage nurse. ? ?Should you have questions after your visit or need to cancel or reschedule your appointment, please contact [facility]  Dept: [phone]  and follow the prompts.  Office hours are 8:00 a.m. to 4:30 p.m. Monday - Friday. Please note that voicemails left after 4:00 p.m. may not be returned until the following business day.  We are closed weekends and major holidays. You have access to a nurse at all times for urgent questions. Please call the main number to the clinic Dept: [phone] and follow the prompts. ? ? ?For any non-urgent questions, you may also contact your provider using MyChart. We now offer e-Visits for anyone 72 and older to request care online for non-urgent symptoms. For details visit [email or web address]. ?  ?Also download the MyChart app! Go to the app store, search "MyChart", open the app, select [facility], and log in with your MyChart username and password. ? ?Due to Covid, a mask is required upon entering the hospital/clinic. If you do not have a mask, one will be given to you upon arrival. For doctor visits, patients may have 1 support person aged 82 or older with them. For treatment visits, patients cannot have anyone with them due to current Covid guidelines and our immunocompromised population.  ? ?

## 2021-12-26 ENCOUNTER — Telehealth: Payer: Self-pay | Admitting: Hematology

## 2021-12-26 NOTE — Telephone Encounter (Signed)
Scheduled follow-up appointments per 3/6 los. Patient is aware. ?

## 2021-12-27 ENCOUNTER — Ambulatory Visit: Payer: Managed Care, Other (non HMO)

## 2022-01-01 ENCOUNTER — Other Ambulatory Visit: Payer: Managed Care, Other (non HMO)

## 2022-01-01 ENCOUNTER — Ambulatory Visit: Payer: Managed Care, Other (non HMO) | Admitting: Hematology

## 2022-01-01 ENCOUNTER — Ambulatory Visit: Payer: Managed Care, Other (non HMO)

## 2022-01-03 ENCOUNTER — Ambulatory Visit: Payer: Managed Care, Other (non HMO)

## 2022-01-08 ENCOUNTER — Encounter: Payer: Self-pay | Admitting: Hematology

## 2022-01-08 ENCOUNTER — Inpatient Hospital Stay (HOSPITAL_BASED_OUTPATIENT_CLINIC_OR_DEPARTMENT_OTHER): Payer: Managed Care, Other (non HMO) | Admitting: Hematology

## 2022-01-08 ENCOUNTER — Ambulatory Visit: Payer: Managed Care, Other (non HMO) | Admitting: Hematology

## 2022-01-08 ENCOUNTER — Inpatient Hospital Stay: Payer: Managed Care, Other (non HMO)

## 2022-01-08 ENCOUNTER — Other Ambulatory Visit: Payer: Self-pay

## 2022-01-08 ENCOUNTER — Other Ambulatory Visit: Payer: Managed Care, Other (non HMO)

## 2022-01-08 ENCOUNTER — Ambulatory Visit: Payer: Managed Care, Other (non HMO)

## 2022-01-08 ENCOUNTER — Encounter: Payer: Self-pay | Admitting: *Deleted

## 2022-01-08 VITALS — HR 99

## 2022-01-08 VITALS — BP 125/75 | HR 107 | Temp 98.9°F | Resp 18 | Ht 65.0 in | Wt 183.1 lb

## 2022-01-08 DIAGNOSIS — C50212 Malignant neoplasm of upper-inner quadrant of left female breast: Secondary | ICD-10-CM

## 2022-01-08 DIAGNOSIS — Z170 Estrogen receptor positive status [ER+]: Secondary | ICD-10-CM

## 2022-01-08 DIAGNOSIS — Z5111 Encounter for antineoplastic chemotherapy: Secondary | ICD-10-CM | POA: Diagnosis not present

## 2022-01-08 DIAGNOSIS — Z95828 Presence of other vascular implants and grafts: Secondary | ICD-10-CM

## 2022-01-08 LAB — CBC WITH DIFFERENTIAL (CANCER CENTER ONLY)
Abs Immature Granulocytes: 0.95 10*3/uL — ABNORMAL HIGH (ref 0.00–0.07)
Basophils Absolute: 0.1 10*3/uL (ref 0.0–0.1)
Basophils Relative: 1 %
Eosinophils Absolute: 0.4 10*3/uL (ref 0.0–0.5)
Eosinophils Relative: 2 %
HCT: 25 % — ABNORMAL LOW (ref 36.0–46.0)
Hemoglobin: 8.6 g/dL — ABNORMAL LOW (ref 12.0–15.0)
Immature Granulocytes: 6 %
Lymphocytes Relative: 4 %
Lymphs Abs: 0.6 10*3/uL — ABNORMAL LOW (ref 0.7–4.0)
MCH: 28.4 pg (ref 26.0–34.0)
MCHC: 34.4 g/dL (ref 30.0–36.0)
MCV: 82.5 fL (ref 80.0–100.0)
Monocytes Absolute: 1.6 10*3/uL — ABNORMAL HIGH (ref 0.1–1.0)
Monocytes Relative: 10 %
Neutro Abs: 12 10*3/uL — ABNORMAL HIGH (ref 1.7–7.7)
Neutrophils Relative %: 77 %
Platelet Count: 356 10*3/uL (ref 150–400)
RBC: 3.03 MIL/uL — ABNORMAL LOW (ref 3.87–5.11)
RDW: 17.1 % — ABNORMAL HIGH (ref 11.5–15.5)
Smear Review: NORMAL
WBC Count: 15.6 10*3/uL — ABNORMAL HIGH (ref 4.0–10.5)
nRBC: 0.2 % (ref 0.0–0.2)

## 2022-01-08 LAB — CMP (CANCER CENTER ONLY)
ALT: 41 U/L (ref 0–44)
AST: 21 U/L (ref 15–41)
Albumin: 3.9 g/dL (ref 3.5–5.0)
Alkaline Phosphatase: 111 U/L (ref 38–126)
Anion gap: 8 (ref 5–15)
BUN: 7 mg/dL — ABNORMAL LOW (ref 8–23)
CO2: 27 mmol/L (ref 22–32)
Calcium: 9.2 mg/dL (ref 8.9–10.3)
Chloride: 104 mmol/L (ref 98–111)
Creatinine: 0.7 mg/dL (ref 0.44–1.00)
GFR, Estimated: >60 mL/min (ref >60)
Glucose, Bld: 120 mg/dL — ABNORMAL HIGH (ref 70–99)
Potassium: 3.7 mmol/L (ref 3.5–5.1)
Sodium: 139 mmol/L (ref 135–145)
Total Bilirubin: 0.4 mg/dL (ref 0.3–1.2)
Total Protein: 7.3 g/dL (ref 6.5–8.1)

## 2022-01-08 MED ORDER — SODIUM CHLORIDE 0.9 % IV SOLN
150.0000 mg | Freq: Once | INTRAVENOUS | Status: AC
  Administered 2022-01-08: 150 mg via INTRAVENOUS
  Filled 2022-01-08: qty 150

## 2022-01-08 MED ORDER — HEPARIN SOD (PORK) LOCK FLUSH 100 UNIT/ML IV SOLN
500.0000 [IU] | Freq: Once | INTRAVENOUS | Status: AC | PRN
  Administered 2022-01-08: 500 [IU]

## 2022-01-08 MED ORDER — SODIUM CHLORIDE 0.9 % IV SOLN
600.0000 mg/m2 | Freq: Once | INTRAVENOUS | Status: AC
  Administered 2022-01-08: 1180 mg via INTRAVENOUS
  Filled 2022-01-08: qty 59

## 2022-01-08 MED ORDER — SODIUM CHLORIDE 0.9% FLUSH
10.0000 mL | INTRAVENOUS | Status: DC | PRN
  Administered 2022-01-08: 10 mL

## 2022-01-08 MED ORDER — METOPROLOL TARTRATE 25 MG PO TABS: 25.0000 mg | ORAL_TABLET | Freq: Two times a day (BID) | ORAL | 0 refills | Status: DC

## 2022-01-08 MED ORDER — PALONOSETRON HCL INJECTION 0.25 MG/5ML
0.2500 mg | Freq: Once | INTRAVENOUS | Status: AC
  Administered 2022-01-08: 0.25 mg via INTRAVENOUS
  Filled 2022-01-08: qty 5

## 2022-01-08 MED ORDER — DOXORUBICIN HCL CHEMO IV INJECTION 2 MG/ML
60.0000 mg/m2 | Freq: Once | INTRAVENOUS | Status: AC
  Administered 2022-01-08: 118 mg via INTRAVENOUS
  Filled 2022-01-08: qty 59

## 2022-01-08 MED ORDER — SODIUM CHLORIDE 0.9 % IV SOLN
10.0000 mg | Freq: Once | INTRAVENOUS | Status: AC
  Administered 2022-01-08: 10 mg via INTRAVENOUS
  Filled 2022-01-08: qty 10

## 2022-01-08 MED ORDER — DEXAMETHASONE 4 MG PO TABS: 4.0000 mg | ORAL_TABLET | Freq: Every day | ORAL | 0 refills | Status: DC

## 2022-01-08 MED ORDER — SODIUM CHLORIDE 0.9% FLUSH
10.0000 mL | Freq: Once | INTRAVENOUS | Status: AC
  Administered 2022-01-08: 10 mL

## 2022-01-08 MED ORDER — SODIUM CHLORIDE 0.9 % IV SOLN: Freq: Once | INTRAVENOUS | Status: AC

## 2022-01-08 NOTE — Patient Instructions (Signed)
Cassandra Brennan  Discharge Instructions: ?Thank you for choosing [facility] to provide your oncology and hematology care.  ? ?If you have a lab appointment with the [facility], please go directly to the [facility] and check in at the registration area. ?  ?Wear comfortable clothing and clothing appropriate for easy access to any Portacath or PICC line.  ? ?We strive to give you quality time with your provider. You may need to reschedule your appointment if you arrive late (15 or more minutes).  Arriving late affects you and other patients whose appointments are after yours.  Also, if you miss three or more appointments without notifying the office, you may be dismissed from the clinic at the provider?s discretion.    ?  ?For prescription refill requests, have your pharmacy contact our office and allow 72 hours for refills to be completed.   ? ?Today you received the following chemotherapy and/or immunotherapy agents: Doxorubicin (Adriamycin) and Cyclophosphamide (Cytoxan). ?  ?To help prevent nausea and vomiting after your treatment, we encourage you to take your nausea medication as directed. ? ?BELOW ARE SYMPTOMS THAT SHOULD BE REPORTED IMMEDIATELY: ?*FEVER GREATER THAN 100.4 F (38 ?C) OR HIGHER ?*CHILLS OR SWEATING ?*NAUSEA AND VOMITING THAT IS NOT CONTROLLED WITH YOUR NAUSEA MEDICATION ?*UNUSUAL SHORTNESS OF BREATH ?*UNUSUAL BRUISING OR BLEEDING ?*URINARY PROBLEMS (pain or burning when urinating, or frequent urination) ?*BOWEL PROBLEMS (unusual diarrhea, constipation, pain near the anus) ?TENDERNESS IN MOUTH AND THROAT WITH OR WITHOUT PRESENCE OF ULCERS (sore throat, sores in mouth, or a toothache) ?UNUSUAL RASH, SWELLING OR PAIN  ?UNUSUAL VAGINAL DISCHARGE OR ITCHING  ? ?Items with * indicate a potential emergency and should be followed up as soon as possible or go to the Emergency Department if any problems should occur. ? ?Please show the CHEMOTHERAPY ALERT CARD  or IMMUNOTHERAPY ALERT CARD at check-in to the Emergency Department and triage nurse. ? ?Should you have questions after your visit or need to cancel or reschedule your appointment, please contact [facility]  Dept: [phone]  and follow the prompts.  Office hours are 8:00 a.m. to 4:30 p.m. Monday - Friday. Please note that voicemails left after 4:00 p.m. may not be returned until the following business day.  We are closed weekends and major holidays. You have access to a nurse at all times for urgent questions. Please call the main number to the clinic Dept: [phone] and follow the prompts. ? ? ?For any non-urgent questions, you may also contact your provider using MyChart. We now offer e-Visits for anyone 46 and older to request care online for non-urgent symptoms. For details visit [email or web address]. ?  ?Also download the MyChart app! Go to the app store, search "MyChart", open the app, select [facility], and log in with your MyChart username and password. ? ?Due to Covid, a mask is required upon entering the hospital/clinic. If you do not have a mask, one will be given to you upon arrival. For doctor visits, patients may have 1 support person aged 45 or older with them. For treatment visits, patients cannot have anyone with them due to current Covid guidelines and our immunocompromised population.  ? ?

## 2022-01-08 NOTE — Progress Notes (Signed)
?[place]   ?[phone] [phone]   ?Clinic Follow up Note  ? ?Patient Care Team: ?Everardo Beals, NP as PCP - General ?Erroll Luna, MD as Consulting Physician (General Surgery) ?Truitt Merle, MD as Consulting Physician (Hematology) ?Gery Pray, MD as Consulting Physician (Radiation Oncology) ?[name], RN as Oncology Nurse Navigator ?[name], RN as Oncology Nurse Navigator ? ?Date of Service:  01/08/2022 ? ?CHIEF COMPLAINT: f/u of left breast cancer ? ?CURRENT THERAPY:  ?Neoadjuvant AC starting 11/28/21 ? ?ASSESSMENT & PLAN:  ?Cassandra Brennan is a 66 y.o. post-menopausal female with  ? ?1. 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 B/l diagnostic MM and left [place] 10/19/21 showed: 2 cm mass at 10 o'clock; 5 mm indeterminate satellite mass at 12 o'clock; palpable left axilla mass corresponds with large lymph node. Biopsy 10/27/21 confirmed invasive ductal carcinoma with necrosis to both 10 o'clock and lymph node, grade 3. ER weakly positive (30%). ?-breast MRI 11/15/21 shows the biopsy proven malignancy in the upper inner left breat measuring 1.9 cm, a biopsy proven malignant 4.3 cm left axillary LN and 2 other abnormal LNs measuring 1.3 and 1.5 cm, and a 1.3 liver mass likely representing a cyst.  ?-CT CAP 11/23/21 shows known left breast cancer and bulky axillary adenopathy, a liver cyst, and tiny indeterminate liver nodules too small to characterize. We will monitor these in the future. No evidence of distant metastasis.  ?-Bone scan is negative for osseous metastasis.  ?-Baseline echo is normal, LVEF 55-60%. ?-Left breast mass in the upper inner quadrant measured 2 cm and 2 palpable lymph nodes largest measuring 4.5 cm on 2/7 the day she began neoadjuvant chemo, for reference ?-She began neoadjuvant AC with cycle 09/27/22, tolerated  well with fatigue and nausea.   ?-After cycle 3 the upper inner left breast mass remains no longer palpable and axillary adenopathy has also continued to improve. ?-Lab reviewed, adequate for treatment, will proceed to cycle 4 AC today ?-Plan to start Taxol in 2 weeks ? ?2. Symptom Management: Fatigue ?-she reports worsening fatigue with each cycle. I will prescribe dexa for her to take several days after chemo. ?  ?3.  Hypertension and anxiety ?-Follow-up with primary care physician ?-she notes elevated HR, 107 in office today (01/08/22). Given her lowered BP, I will call in a lower dose, short-acting metroprolol for her to take 1-2x a day.  ? ?4. Anemia ?-secondary to chemo ?-hgb has been slowly dropping, 8.6 today (01/08/22) ?-will monitor ? ? ?PLAN: ?-proceed with C4 AC today ?-I called in dexa for her to take after chemo ?-lab, flush, f/u, and start weekly taxol in 2 weeks as scheduled ? ? ?No problem-specific Assessment & Plan notes found for this encounter. ? ? ?SUMMARY OF ONCOLOGIC HISTORY: ?Oncology History Overview Note  ? Cancer Staging  ?Malignant neoplasm of upper-inner quadrant of left breast in female, estrogen receptor positive ([place]) ?Staging form: Breast, AJCC 8th Edition ?- Clinical stage from 10/27/2021: Stage IIB (cT1c, cN1, cM0, G3, ER+, PR-, HER2-) - Signed by Truitt Merle, MD on 11/07/2021 ? ?  ?Malignant neoplasm of upper-inner quadrant of left breast in female, estrogen receptor positive ([place])  ?10/19/2021 Mammogram  ? CLINICAL DATA:  Patient presents for palpable left axillary ?abnormality. ?  ?EXAM: ?DIGITAL DIAGNOSTIC BILATERAL MAMMOGRAM WITH TOMOSYNTHESIS AND CAD; ?ULTRASOUND LEFT BREAST LIMITED ? ?IMPRESSION: ?Suspicious left  breast mass 10 o'clock position. ?  ?Adjacent suspicious satellite nodule left breast 12 o'clock ?position. ?  ?Palpable mass left axilla corresponds with a large lymph node. ?  ?10/27/2021 Initial Biopsy  ? Diagnosis ?1. Breast, left, needle core biopsy, 12 o'clock,  ribbon ?- FIBROADENOMA ?- NO MALIGNANCY IDENTIFIED ?2. Breast, left, needle core biopsy, 10 o'clock, coil ?- INVASIVE DUCTAL CARCINOMA WITH NECROSIS ?- DUCTAL CARCINOMA IN SITU ?- SEE COMMENT ?3. Lymph node, needle/core biopsy, left axilla, tribell ?- INVASIVE DUCTAL CARCINOMA WITH NECROSIS ?- NO NODAL TISSUE IDENTIFIED ?- SEE COMMENT ?Microscopic Comment ?2. and 2. Based on the biopsy, the carcinoma appears Nottingham grade 3 of 3 and measures 0.8 cm in greatest linear ?extent. ? ?3. PROGNOSTIC INDICATORS ?Results: ?The tumor cells are NEGATIVE for Her2 (1+). ?Estrogen Receptor: 30%, POSITIVE, WEAK STAINING INTENSITY ?Progesterone Receptor: 0%, NEGATIVE ?Proliferation Marker Ki67: 60% ?  ?10/27/2021 Cancer Staging  ? Staging form: Breast, AJCC 8th Edition ?- Clinical stage from 10/27/2021: Stage IIB (cT1c, cN1, cM0, G3, ER+, PR-, HER2-) - Signed by [name], NP on 11/28/2021 ?Stage prefix: Initial diagnosis ?Histologic grading system: 3 grade system ? ?  ?11/06/2021 Initial Diagnosis  ? Malignant neoplasm of upper-inner quadrant of left breast in female, estrogen receptor positive ([place]) ?  ?11/15/2021 Breast MRI  ? IMPRESSION: ?1.9 cm mass in the upper-inner quadrant of the left breast corresponding with the known invasive ductal carcinoma. Enlarged left axillary lymph node corresponding with known metastatic disease. ?  ?RECOMMENDATION: ?Treatment planning of the known left breast cancer and axillary metastasis is recommended. ?  ?Additional imaging evaluation of the mass in the liver is recommended with CT with liver mass protocol or MRI. ?  ?11/22/2021 Imaging  ? Bone scan IMPRESSION: ?No scintigraphic evidence of bony metastatic disease. Degenerative changes as above. ?  ?11/22/2021 Echocardiogram  ? Baseline echo LVEF 55-60%, normal GLS -21.7% ?  ?11/23/2021 Imaging  ? CT CAP IMPRESSION: ?1. Bulky lymphadenopathy in the left axilla including a 5.0 x 2.8 cm lymph node. ?2. 15 mm nodule identified in the upper inner  quadrant of the left breast. ?3. No suspicious pulmonary nodule or mass. No evidence for metastatic disease in the abdomen or pelvis. ?4. 11 mm cyst in the dome of the left liver. Adjacent tiny hypoattenuating lesions in the right liver are too small to characterize, but likely benign. Attention on follow-up recommended. ?5. Prominent stool volume raises the question of clinical constipation. ?  ?11/28/2021 -  Chemotherapy  ? Patient is on Treatment Plan : BREAST ADJUVANT DOSE DENSE AC q14d / PACLitaxel q7d  ?   ? Genetic Testing  ? [employer] CancerNext was Negative. Report date is 11/26/2021.  ? ?The CancerNext gene panel offered by [employer] includes sequencing, rearrangement analysis, and RNA analysis for the following 36 genes:   APC, ATM, AXIN2, BARD1, BMPR1A, BRCA1, BRCA2, BRIP1, CDH1, CDK4, CDKN2A, CHEK2, DICER1, HOXB13, EPCAM, GREM1, MLH1, MSH2, MSH3, MSH6, MUTYH, NBN, NF1, NTHL1, PALB2, PMS2, POLD1, POLE, PTEN, RAD51C, RAD51D, RECQL, SMAD4, SMARCA4, STK11, and TP53.  ?  ? ? ? ?INTERVAL HISTORY:  ?Cassandra Brennan is here for a follow up of breast cancer. She was last seen by NP Lacie on 12/25/21. She presents to the clinic alone. ?She reports the last cycle of AC hit her pretty hard. She reports her main complaint was fatigue. She notes she felt better last week. ?  ?All other systems were reviewed with the patient and are negative. ? ?MEDICAL HISTORY:  ?  Past Medical History:  ?Diagnosis Date  ? Anxiety   ? Arthritis   ? right knee   ? GERD (gastroesophageal reflux disease)   ? Hyperlipidemia   ? Hypertension   ? Positive colorectal cancer screening using Cologuard test   ? ? ?SURGICAL HISTORY: ?Past Surgical History:  ?Procedure Laterality Date  ? BREAST CYST EXCISION Right   ? pt stated in 1980  ? BREAST SURGERY    ? other GI testing    ? unsure but had to drink a chalky drink   ? PORTACATH PLACEMENT N/A 11/27/2021  ? Procedure: INSERTION PORT-A-CATH;  Surgeon: Cornett, Thomas, MD;  Location: [facility];  Service:  General;  Laterality: N/A;  ? TONSILLECTOMY    ? TUBAL LIGATION    ? ? ?I have reviewed the social history and family history with the patient and they are unchanged from previous note. ? ?ALLERGIES:  is allergic

## 2022-01-10 ENCOUNTER — Ambulatory Visit: Payer: Managed Care, Other (non HMO)

## 2022-01-10 ENCOUNTER — Inpatient Hospital Stay: Payer: Managed Care, Other (non HMO)

## 2022-01-15 ENCOUNTER — Ambulatory Visit: Payer: Managed Care, Other (non HMO) | Admitting: Hematology

## 2022-01-15 ENCOUNTER — Other Ambulatory Visit: Payer: Managed Care, Other (non HMO)

## 2022-01-15 ENCOUNTER — Ambulatory Visit: Payer: Managed Care, Other (non HMO)

## 2022-01-16 ENCOUNTER — Telehealth: Payer: Self-pay

## 2022-01-16 ENCOUNTER — Other Ambulatory Visit: Payer: Self-pay | Admitting: Hematology

## 2022-01-16 ENCOUNTER — Other Ambulatory Visit: Payer: Self-pay

## 2022-01-16 ENCOUNTER — Inpatient Hospital Stay: Payer: Managed Care, Other (non HMO)

## 2022-01-16 DIAGNOSIS — Z95828 Presence of other vascular implants and grafts: Secondary | ICD-10-CM

## 2022-01-16 DIAGNOSIS — Z5111 Encounter for antineoplastic chemotherapy: Secondary | ICD-10-CM | POA: Diagnosis not present

## 2022-01-16 DIAGNOSIS — Z170 Estrogen receptor positive status [ER+]: Secondary | ICD-10-CM

## 2022-01-16 LAB — ABO/RH: ABO/RH(D): B POS

## 2022-01-16 LAB — CBC WITH DIFFERENTIAL (CANCER CENTER ONLY)
Abs Immature Granulocytes: 0.11 10*3/uL — ABNORMAL HIGH (ref 0.00–0.07)
Basophils Absolute: 0.1 10*3/uL (ref 0.0–0.1)
Basophils Relative: 4 %
Eosinophils Absolute: 0.1 10*3/uL (ref 0.0–0.5)
Eosinophils Relative: 6 %
HCT: 21.1 % — ABNORMAL LOW (ref 36.0–46.0)
Hemoglobin: 7.2 g/dL — ABNORMAL LOW (ref 12.0–15.0)
Immature Granulocytes: 8 %
Lymphocytes Relative: 25 %
Lymphs Abs: 0.4 10*3/uL — ABNORMAL LOW (ref 0.7–4.0)
MCH: 27.9 pg (ref 26.0–34.0)
MCHC: 34.1 g/dL (ref 30.0–36.0)
MCV: 81.8 fL (ref 80.0–100.0)
Monocytes Absolute: 0.2 10*3/uL (ref 0.1–1.0)
Monocytes Relative: 12 %
Neutro Abs: 0.6 10*3/uL — ABNORMAL LOW (ref 1.7–7.7)
Neutrophils Relative %: 45 %
Platelet Count: 91 10*3/uL — ABNORMAL LOW (ref 150–400)
RBC: 2.58 MIL/uL — ABNORMAL LOW (ref 3.87–5.11)
RDW: 15.9 % — ABNORMAL HIGH (ref 11.5–15.5)
WBC Count: 1.4 10*3/uL — ABNORMAL LOW (ref 4.0–10.5)
nRBC: 0 % (ref 0.0–0.2)

## 2022-01-16 LAB — CMP (CANCER CENTER ONLY)
ALT: 22 U/L (ref 0–44)
AST: 15 U/L (ref 15–41)
Albumin: 3.8 g/dL (ref 3.5–5.0)
Alkaline Phosphatase: 124 U/L (ref 38–126)
Anion gap: 8 (ref 5–15)
BUN: 12 mg/dL (ref 8–23)
CO2: 25 mmol/L (ref 22–32)
Calcium: 8.9 mg/dL (ref 8.9–10.3)
Chloride: 104 mmol/L (ref 98–111)
Creatinine: 0.68 mg/dL (ref 0.44–1.00)
GFR, Estimated: >60 mL/min (ref >60)
Glucose, Bld: 108 mg/dL — ABNORMAL HIGH (ref 70–99)
Potassium: 3.5 mmol/L (ref 3.5–5.1)
Sodium: 137 mmol/L (ref 135–145)
Total Bilirubin: 0.4 mg/dL (ref 0.3–1.2)
Total Protein: 6.7 g/dL (ref 6.5–8.1)

## 2022-01-16 LAB — SAMPLE TO BLOOD BANK

## 2022-01-16 LAB — PREPARE RBC (CROSSMATCH)

## 2022-01-16 MED ORDER — SODIUM CHLORIDE 0.9% FLUSH
10.0000 mL | Freq: Once | INTRAVENOUS | Status: AC
  Administered 2022-01-16: 10 mL

## 2022-01-16 NOTE — Progress Notes (Signed)
Kathylean in Blood Bank confirmed T&S and Prepare order in Epic.  1 unit of PRBCs will be transfused on 01/18/2022. ?

## 2022-01-16 NOTE — Progress Notes (Signed)
Placed orders for CBC w/Diff, CMP, Blood Bank Holding, and ABO.  Pt coming in for labs today.  Port Flush will draw CBC w/Diff, CMP, & Blood Bank Holding.  Med Onc Lab will draw ABO. ?

## 2022-01-16 NOTE — Telephone Encounter (Signed)
Pt called stating her BP is 94/68 and  HR 110.  Pt stated Dr. Burr Medico lowered her Metoprolol to '25mg'$  BID.  Pt stated she's keeping a record of of her BP and her BP has been running low but her HR has been going up.  Pt stated she got AC last Monday 01/08/2022.  Pt wanted to know should she take her morning BP medication since her BP is 94/68.  Pt also stated that she is really fatigue and drinking 1ea 8oz glass of water every 2 hrs.  Pt's Hbg on 01/08/2022 was 8.6.  Asked pt could she come in for lab draw today.  Pt stated she could come in for labs at anytime today.  Scheduled appts for labs (CMP, CBC w/Diff, Blood Bank Holding, and ABO).  Instructed pt to hold morning dose of BP medication today but check BP this evening and if BP is normal take evening dose of Metoprolol.  Pt verbalized understanding of instructions.   ?

## 2022-01-17 ENCOUNTER — Ambulatory Visit: Payer: Managed Care, Other (non HMO)

## 2022-01-18 ENCOUNTER — Inpatient Hospital Stay: Payer: Managed Care, Other (non HMO)

## 2022-01-18 DIAGNOSIS — Z5111 Encounter for antineoplastic chemotherapy: Secondary | ICD-10-CM | POA: Diagnosis not present

## 2022-01-18 DIAGNOSIS — C50212 Malignant neoplasm of upper-inner quadrant of left female breast: Secondary | ICD-10-CM

## 2022-01-18 MED ORDER — SODIUM CHLORIDE 0.9% IV SOLUTION
250.0000 mL | Freq: Once | INTRAVENOUS | Status: AC
  Administered 2022-01-18: 250 mL via INTRAVENOUS

## 2022-01-18 MED ORDER — SODIUM CHLORIDE 0.9% FLUSH
10.0000 mL | Freq: Once | INTRAVENOUS | Status: AC
  Administered 2022-01-18: 10 mL via INTRAVENOUS

## 2022-01-18 MED ORDER — HEPARIN SOD (PORK) LOCK FLUSH 100 UNIT/ML IV SOLN
500.0000 [IU] | Freq: Once | INTRAVENOUS | Status: AC
  Administered 2022-01-18: 500 [IU] via INTRAVENOUS

## 2022-01-18 NOTE — Patient Instructions (Signed)
Blood Transfusion, Adult, Care After This sheet gives you information about how to care for yourself after your procedure. Your doctor may also give you more specific instructions. If you have problems or questions, contact your doctor. What can I expect after the procedure? After the procedure, it is common to have: Bruising and soreness at the IV site. A headache. Follow these instructions at home: Insertion site care   Follow instructions from your doctor about how to take care of your insertion site. This is where an IV tube was put into your vein. Make sure you: Wash your hands with soap and water before and after you change your bandage (dressing). If you cannot use soap and water, use hand sanitizer. Change your bandage as told by your doctor. Check your insertion site every day for signs of infection. Check for: Redness, swelling, or pain. Bleeding from the site. Warmth. Pus or a bad smell. General instructions Take over-the-counter and prescription medicines only as told by your doctor. Rest as told by your doctor. Go back to your normal activities as told by your doctor. Keep all follow-up visits as told by your doctor. This is important. Contact a doctor if: You have itching or red, swollen areas of skin (hives). You feel worried or nervous (anxious). You feel weak after doing your normal activities. You have redness, swelling, warmth, or pain around the insertion site. You have blood coming from the insertion site, and the blood does not stop with pressure. You have pus or a bad smell coming from the insertion site. Get help right away if: You have signs of a serious reaction. This may be coming from an allergy or the body's defense system (immune system). Signs include: Trouble breathing or shortness of breath. Swelling of the face or feeling warm (flushed). Fever or chills. Head, chest, or back pain. Dark pee (urine) or blood in the pee. Widespread rash. Fast  heartbeat. Feeling dizzy or light-headed. You may receive your blood transfusion in an outpatient setting. If so, you will be told whom to contact to report any reactions. These symptoms may be an emergency. Do not wait to see if the symptoms will go away. Get medical help right away. Call your local emergency services (911 in the [place] Do not drive yourself to the hospital. Summary Bruising and soreness at the IV site are common. Check your insertion site every day for signs of infection. Rest as told by your doctor. Go back to your normal activities as told by your doctor. Get help right away if you have signs of a serious reaction. This information is not intended to replace advice given to you by your health care provider. Make sure you discuss any questions you have with your health care provider. Document Revised: 02/02/2021 Document Reviewed: 04/02/2019 Elsevier Patient Education  2022 Elsevier Inc.  

## 2022-01-19 LAB — TYPE AND SCREEN
ABO/RH(D): B POS
Antibody Screen: NEGATIVE
Unit division: 0

## 2022-01-19 LAB — BPAM RBC
Blood Product Expiration Date: 202304242359
ISSUE DATE / TIME: 202303301346
Unit Type and Rh: 7300

## 2022-01-20 ENCOUNTER — Other Ambulatory Visit: Payer: Self-pay | Admitting: Hematology

## 2022-01-20 NOTE — Progress Notes (Signed)
?[place]   ?[phone] [phone]   ?Clinic Follow up Note  ? ?Patient Care Team: ?Everardo Beals, NP as PCP - General ?Erroll Luna, MD as Consulting Physician (General Surgery) ?Truitt Merle, MD as Consulting Physician (Hematology) ?Gery Pray, MD as Consulting Physician (Radiation Oncology) ?[name], RN as Oncology Nurse Navigator ?[name], RN as Oncology Nurse Navigator ?01/22/2022 ? ?CHIEF COMPLAINT: Follow up left breast cancer  ? ?SUMMARY OF ONCOLOGIC HISTORY: ?Oncology History Overview Note  ? Cancer Staging  ?Malignant neoplasm of upper-inner quadrant of left breast in female, estrogen receptor positive ([place]) ?Staging form: Breast, AJCC 8th Edition ?- Clinical stage from 10/27/2021: Stage IIB (cT1c, cN1, cM0, G3, ER+, PR-, HER2-) - Signed by Truitt Merle, MD on 11/07/2021 ? ?  ?Malignant neoplasm of upper-inner quadrant of left breast in female, estrogen receptor positive ([place])  ?10/19/2021 Mammogram  ? CLINICAL DATA:  Patient presents for palpable left axillary ?abnormality. ?  ?EXAM: ?DIGITAL DIAGNOSTIC BILATERAL MAMMOGRAM WITH TOMOSYNTHESIS AND CAD; ?ULTRASOUND LEFT BREAST LIMITED ? ?IMPRESSION: ?Suspicious left breast mass 10 o'clock position. ?  ?Adjacent suspicious satellite nodule left breast 12 o'clock ?position. ?  ?Palpable mass left axilla corresponds with a large lymph node. ?  ?10/27/2021 Initial Biopsy  ? Diagnosis ?1. Breast, left, needle core biopsy, 12 o'clock, ribbon ?- FIBROADENOMA ?- NO MALIGNANCY IDENTIFIED ?2. Breast, left, needle core biopsy, 10 o'clock, coil ?- INVASIVE DUCTAL CARCINOMA WITH NECROSIS ?- DUCTAL CARCINOMA IN SITU ?- SEE COMMENT ?3. Lymph node, needle/core biopsy, left axilla, tribell ?- INVASIVE DUCTAL CARCINOMA WITH NECROSIS ?- NO NODAL TISSUE IDENTIFIED ?- SEE COMMENT ?Microscopic Comment ?2. and 2. Based on the biopsy, the carcinoma appears Nottingham grade 3 of 3 and measures 0.8 cm in greatest  linear ?extent. ? ?3. PROGNOSTIC INDICATORS ?Results: ?The tumor cells are NEGATIVE for Her2 (1+). ?Estrogen Receptor: 30%, POSITIVE, WEAK STAINING INTENSITY ?Progesterone Receptor: 0%, NEGATIVE ?Proliferation Marker Ki67: 60% ?  ?10/27/2021 Cancer Staging  ? Staging form: Breast, AJCC 8th Edition ?- Clinical stage from 10/27/2021: Stage IIB (cT1c, cN1, cM0, G3, ER+, PR-, HER2-) - Signed by [name], NP on 11/28/2021 ?Stage prefix: Initial diagnosis ?Histologic grading system: 3 grade system ? ?  ?11/06/2021 Initial Diagnosis  ? Malignant neoplasm of upper-inner quadrant of left breast in female, estrogen receptor positive ([place]) ?  ?11/15/2021 Breast MRI  ? IMPRESSION: ?1.9 cm mass in the upper-inner quadrant of the left breast corresponding with the known invasive ductal carcinoma. Enlarged left axillary lymph node corresponding with known metastatic disease. ?  ?RECOMMENDATION: ?Treatment planning of the known left breast cancer and axillary metastasis is recommended. ?  ?Additional imaging evaluation of the mass in the liver is recommended with CT with liver mass protocol or MRI. ?  ?11/22/2021 Imaging  ? Bone scan IMPRESSION: ?No scintigraphic evidence of bony metastatic disease. Degenerative changes as above. ?  ?11/22/2021 Echocardiogram  ? Baseline echo LVEF 55-60%, normal GLS -21.7% ?  ?11/23/2021 Imaging  ? CT CAP IMPRESSION: ?1. Bulky lymphadenopathy in the left axilla including a 5.0 x 2.8 cm lymph node. ?2. 15 mm nodule identified in the upper inner quadrant of the left breast. ?3. No suspicious pulmonary nodule or mass. No evidence for metastatic disease in the abdomen or pelvis. ?4. 11 mm cyst in the dome of the left liver. Adjacent tiny hypoattenuating lesions in the right liver are too small to characterize, but likely benign. Attention on follow-up recommended. ?5. Prominent stool volume raises the  question of clinical constipation. ?  ?11/28/2021 -  Chemotherapy  ? Patient is on Treatment Plan : BREAST  ADJUVANT DOSE DENSE AC q14d / PACLitaxel q7d  ?   ? Genetic Testing  ? [employer] CancerNext was Negative. Report date is 11/26/2021.  ? ?The CancerNext gene panel offered by [employer] includes sequencing, rearrangement analysis, and RNA analysis for the following 36 genes:   APC, ATM, AXIN2, BARD1, BMPR1A, BRCA1, BRCA2, BRIP1, CDH1, CDK4, CDKN2A, CHEK2, DICER1, HOXB13, EPCAM, GREM1, MLH1, MSH2, MSH3, MSH6, MUTYH, NBN, NF1, NTHL1, PALB2, PMS2, POLD1, POLE, PTEN, RAD51C, RAD51D, RECQL, SMAD4, SMARCA4, STK11, and TP53.  ?  ? ? ?CURRENT THERAPY: Neoadjuvant chemotherapy with AC-T starting 09/27/22 ? ?INTERVAL HISTORY: Cassandra Brennan returns for follow up as scheduled. She completed 4th/final cycle of [facility] 01/08/22.  The last round was "rough," with exhaustion and mild exertional dyspnea.  She checked her BP at home which was low, heart rate was high over 100, and she was a little bit hot.  No measurable fever.  She was more anxious and possibly had an anxiety attack.  Xanax is very helpful.  She called and came in for blood work 01/16/2022 where she was found to be pancytopenic, Hgb 7.2, platelet 91, ANC 0.6.  She received a blood transfusion and feels much better, energy improved and dyspnea resolved.  Otherwise doing well, denies nausea/vomiting.  She has occasional constipation.  Eating and drinking well. She does not feel a left breast mass.  She is here today to start taxol.  Denies baseline neuropathy ? ?All other systems were reviewed with the patient and are negative. ? ?MEDICAL HISTORY:  ?Past Medical History:  ?Diagnosis Date  ? Anxiety   ? Arthritis   ? right knee   ? GERD (gastroesophageal reflux disease)   ? Hyperlipidemia   ? Hypertension   ? Positive colorectal cancer screening using Cologuard test   ? ? ?SURGICAL HISTORY: ?Past Surgical History:  ?Procedure Laterality Date  ? BREAST CYST EXCISION Right   ? pt stated in 1980  ? BREAST SURGERY    ? other GI testing    ? unsure but had to drink a chalky drink   ?  PORTACATH PLACEMENT N/A 11/27/2021  ? Procedure: INSERTION PORT-A-CATH;  Surgeon: Erroll Luna, MD;  Location: [facility];  Service: General;  Laterality: N/A;  ? TONSILLECTOMY    ? TUBAL LIGATION    ? ? ?I have reviewed the social history and family history with the patient and they are unchanged from previous note. ? ?ALLERGIES:  is allergic to prednisone, rosuvastatin, other, robitussin (alcohol free) [guaifenesin], and sulfa antibiotics. ? ?MEDICATIONS:  ?Current Outpatient Medications  ?Medication Sig Dispense Refill  ? acetaminophen (TYLENOL) 500 MG tablet Take 500 mg by mouth every 8 (eight) hours as needed for moderate pain.    ? dexamethasone (DECADRON) 4 MG tablet Take 1 tablet (4 mg total) by mouth daily. Take for 3-5 days after chemo 10 tablet 0  ? lidocaine-prilocaine (EMLA) cream Apply to affected area once 30 g 3  ? metoprolol tartrate (LOPRESSOR) 25 MG tablet Take 1 tablet (25 mg total) by mouth 2 (two) times daily. 30 tablet 0  ? ondansetron (ZOFRAN) 8 MG tablet Take 1 tablet (8 mg total) by mouth 2 (two) times daily as needed. Start on the third day after chemotherapy. 30 tablet 1  ? oxyCODONE (OXY IR/ROXICODONE) 5 MG immediate release tablet Take 1 tablet (5 mg total) by mouth every 6 (six) hours as  needed for severe pain. 15 tablet 0  ? pravastatin (PRAVACHOL) 20 MG tablet Take 1 tablet (20 mg total) by mouth daily. 30 tablet 0  ? prochlorperazine (COMPAZINE) 10 MG tablet Take 1 tablet (10 mg total) by mouth every 6 (six) hours as needed (Nausea or vomiting). 30 tablet 1  ? ?No current facility-administered medications for this visit.  ? ? ?PHYSICAL EXAMINATION: ?ECOG PERFORMANCE STATUS: 1 - Symptomatic but completely ambulatory ? ?Vitals:  ? 01/22/22 1004  ?BP: 132/85  ?Pulse: 97  ?Resp: 17  ?Temp: 98.9 ?F (37.2 ?C)  ?SpO2: 100%  ? ?Filed Weights  ? 01/22/22 1004  ?Weight: 182 lb 6 oz (82.7 kg)  ? ? ?GENERAL:alert, no distress and comfortable ?SKIN: No rash ?EYES: sclera clear ?NECK: Without  mass ?LYMPH:  no palpable cervical or supraclavicular lymphadenopathy ?LUNGS:  normal breathing effort ?HEART: no lower extremity edema ?NEURO: alert & oriented x 3 with fluent speech, no focal motor/sensory deficits ?Breast exam

## 2022-01-22 ENCOUNTER — Inpatient Hospital Stay: Payer: Managed Care, Other (non HMO) | Attending: Hematology

## 2022-01-22 ENCOUNTER — Encounter: Payer: Self-pay | Admitting: Nurse Practitioner

## 2022-01-22 ENCOUNTER — Inpatient Hospital Stay: Payer: Managed Care, Other (non HMO)

## 2022-01-22 ENCOUNTER — Inpatient Hospital Stay (HOSPITAL_BASED_OUTPATIENT_CLINIC_OR_DEPARTMENT_OTHER): Payer: Managed Care, Other (non HMO) | Admitting: Nurse Practitioner

## 2022-01-22 ENCOUNTER — Other Ambulatory Visit: Payer: Self-pay

## 2022-01-22 VITALS — BP 138/81 | HR 83 | Resp 18

## 2022-01-22 VITALS — BP 132/85 | HR 97 | Temp 98.9°F | Resp 17 | Wt 182.4 lb

## 2022-01-22 DIAGNOSIS — C50212 Malignant neoplasm of upper-inner quadrant of left female breast: Secondary | ICD-10-CM

## 2022-01-22 DIAGNOSIS — Z882 Allergy status to sulfonamides status: Secondary | ICD-10-CM | POA: Diagnosis not present

## 2022-01-22 DIAGNOSIS — Z5111 Encounter for antineoplastic chemotherapy: Secondary | ICD-10-CM | POA: Diagnosis not present

## 2022-01-22 DIAGNOSIS — R5383 Other fatigue: Secondary | ICD-10-CM | POA: Insufficient documentation

## 2022-01-22 DIAGNOSIS — K7689 Other specified diseases of liver: Secondary | ICD-10-CM | POA: Insufficient documentation

## 2022-01-22 DIAGNOSIS — I10 Essential (primary) hypertension: Secondary | ICD-10-CM | POA: Diagnosis not present

## 2022-01-22 DIAGNOSIS — Z79899 Other long term (current) drug therapy: Secondary | ICD-10-CM | POA: Diagnosis not present

## 2022-01-22 DIAGNOSIS — Z170 Estrogen receptor positive status [ER+]: Secondary | ICD-10-CM

## 2022-01-22 DIAGNOSIS — Z888 Allergy status to other drugs, medicaments and biological substances status: Secondary | ICD-10-CM | POA: Diagnosis not present

## 2022-01-22 DIAGNOSIS — G620 Drug-induced polyneuropathy: Secondary | ICD-10-CM | POA: Diagnosis not present

## 2022-01-22 DIAGNOSIS — D649 Anemia, unspecified: Secondary | ICD-10-CM | POA: Diagnosis not present

## 2022-01-22 DIAGNOSIS — C773 Secondary and unspecified malignant neoplasm of axilla and upper limb lymph nodes: Secondary | ICD-10-CM | POA: Insufficient documentation

## 2022-01-22 DIAGNOSIS — Z95828 Presence of other vascular implants and grafts: Secondary | ICD-10-CM

## 2022-01-22 DIAGNOSIS — F419 Anxiety disorder, unspecified: Secondary | ICD-10-CM | POA: Insufficient documentation

## 2022-01-22 DIAGNOSIS — R0600 Dyspnea, unspecified: Secondary | ICD-10-CM | POA: Diagnosis not present

## 2022-01-22 DIAGNOSIS — T451X5A Adverse effect of antineoplastic and immunosuppressive drugs, initial encounter: Secondary | ICD-10-CM | POA: Diagnosis not present

## 2022-01-22 DIAGNOSIS — R110 Nausea: Secondary | ICD-10-CM | POA: Diagnosis not present

## 2022-01-22 DIAGNOSIS — R590 Localized enlarged lymph nodes: Secondary | ICD-10-CM | POA: Insufficient documentation

## 2022-01-22 LAB — CMP (CANCER CENTER ONLY)
ALT: 23 U/L (ref 0–44)
AST: 19 U/L (ref 15–41)
Albumin: 3.9 g/dL (ref 3.5–5.0)
Alkaline Phosphatase: 115 U/L (ref 38–126)
Anion gap: 6 (ref 5–15)
BUN: 12 mg/dL (ref 8–23)
CO2: 27 mmol/L (ref 22–32)
Calcium: 9.1 mg/dL (ref 8.9–10.3)
Chloride: 108 mmol/L (ref 98–111)
Creatinine: 0.64 mg/dL (ref 0.44–1.00)
GFR, Estimated: >60 mL/min (ref >60)
Glucose, Bld: 96 mg/dL (ref 70–99)
Potassium: 3.8 mmol/L (ref 3.5–5.1)
Sodium: 141 mmol/L (ref 135–145)
Total Bilirubin: 0.3 mg/dL (ref 0.3–1.2)
Total Protein: 7 g/dL (ref 6.5–8.1)

## 2022-01-22 LAB — CBC WITH DIFFERENTIAL (CANCER CENTER ONLY)
Abs Immature Granulocytes: 0.51 10*3/uL — ABNORMAL HIGH (ref 0.00–0.07)
Basophils Absolute: 0 10*3/uL (ref 0.0–0.1)
Basophils Relative: 1 %
Eosinophils Absolute: 0.2 10*3/uL (ref 0.0–0.5)
Eosinophils Relative: 3 %
HCT: 28.4 % — ABNORMAL LOW (ref 36.0–46.0)
Hemoglobin: 9.4 g/dL — ABNORMAL LOW (ref 12.0–15.0)
Immature Granulocytes: 8 %
Lymphocytes Relative: 10 %
Lymphs Abs: 0.6 10*3/uL — ABNORMAL LOW (ref 0.7–4.0)
MCH: 27.6 pg (ref 26.0–34.0)
MCHC: 33.1 g/dL (ref 30.0–36.0)
MCV: 83.5 fL (ref 80.0–100.0)
Monocytes Absolute: 0.6 10*3/uL (ref 0.1–1.0)
Monocytes Relative: 9 %
Neutro Abs: 4.2 10*3/uL (ref 1.7–7.7)
Neutrophils Relative %: 69 %
Platelet Count: 230 10*3/uL (ref 150–400)
RBC: 3.4 MIL/uL — ABNORMAL LOW (ref 3.87–5.11)
RDW: 17.3 % — ABNORMAL HIGH (ref 11.5–15.5)
Smear Review: ADEQUATE
WBC Count: 6.1 10*3/uL (ref 4.0–10.5)
nRBC: 0 % (ref 0.0–0.2)

## 2022-01-22 MED ORDER — ALPRAZOLAM 0.25 MG PO TABS
0.2500 mg | ORAL_TABLET | Freq: Three times a day (TID) | ORAL | 0 refills | Status: AC | PRN
Start: 2022-01-22 — End: 2022-02-05

## 2022-01-22 MED ORDER — SODIUM CHLORIDE 0.9% FLUSH
10.0000 mL | INTRAVENOUS | Status: DC | PRN
  Administered 2022-01-22: 10 mL

## 2022-01-22 MED ORDER — SODIUM CHLORIDE 0.9 % IV SOLN
10.0000 mg | Freq: Once | INTRAVENOUS | Status: AC
  Administered 2022-01-22: 10 mg via INTRAVENOUS
  Filled 2022-01-22: qty 10

## 2022-01-22 MED ORDER — SODIUM CHLORIDE 0.9% FLUSH
10.0000 mL | Freq: Once | INTRAVENOUS | Status: AC
  Administered 2022-01-22: 10 mL

## 2022-01-22 MED ORDER — METOPROLOL TARTRATE 25 MG PO TABS: 25.0000 mg | ORAL_TABLET | Freq: Two times a day (BID) | ORAL | 0 refills | Status: DC

## 2022-01-22 MED ORDER — DIPHENHYDRAMINE HCL 50 MG/ML IJ SOLN
25.0000 mg | Freq: Once | INTRAMUSCULAR | Status: AC
  Administered 2022-01-22: 25 mg via INTRAVENOUS
  Filled 2022-01-22: qty 1

## 2022-01-22 MED ORDER — FAMOTIDINE IN NACL 20-0.9 MG/50ML-% IV SOLN
20.0000 mg | Freq: Once | INTRAVENOUS | Status: AC
  Administered 2022-01-22: 20 mg via INTRAVENOUS
  Filled 2022-01-22: qty 50

## 2022-01-22 MED ORDER — SODIUM CHLORIDE 0.9 % IV SOLN
80.0000 mg/m2 | Freq: Once | INTRAVENOUS | Status: AC
  Administered 2022-01-22: 156 mg via INTRAVENOUS
  Filled 2022-01-22: qty 26

## 2022-01-22 MED ORDER — HEPARIN SOD (PORK) LOCK FLUSH 100 UNIT/ML IV SOLN
500.0000 [IU] | Freq: Once | INTRAVENOUS | Status: AC | PRN
  Administered 2022-01-22: 500 [IU]

## 2022-01-22 MED ORDER — SODIUM CHLORIDE 0.9 % IV SOLN: Freq: Once | INTRAVENOUS | Status: AC

## 2022-01-22 NOTE — Patient Instructions (Signed)
[facility] MEDICAL ONCOLOGY  Discharge Instructions: Thank you for choosing [facility] to provide your oncology and hematology care.   If you have a lab appointment with the Cancer Center, please go directly to the Cancer Center and check in at the registration area.   Wear comfortable clothing and clothing appropriate for easy access to any Portacath or PICC line.   We strive to give you quality time with your provider. You may need to reschedule your appointment if you arrive late (15 or more minutes).  Arriving late affects you and other patients whose appointments are after yours.  Also, if you miss three or more appointments without notifying the office, you may be dismissed from the clinic at the provider's discretion.      For prescription refill requests, have your pharmacy contact our office and allow 72 hours for refills to be completed.    Today you received the following chemotherapy and/or immunotherapy agents taxol      To help prevent nausea and vomiting after your treatment, we encourage you to take your nausea medication as directed.  BELOW ARE SYMPTOMS THAT SHOULD BE REPORTED IMMEDIATELY: *FEVER GREATER THAN 100.4 F (38 C) OR HIGHER *CHILLS OR SWEATING *NAUSEA AND VOMITING THAT IS NOT CONTROLLED WITH YOUR NAUSEA MEDICATION *UNUSUAL SHORTNESS OF BREATH *UNUSUAL BRUISING OR BLEEDING *URINARY PROBLEMS (pain or burning when urinating, or frequent urination) *BOWEL PROBLEMS (unusual diarrhea, constipation, pain near the anus) TENDERNESS IN MOUTH AND THROAT WITH OR WITHOUT PRESENCE OF ULCERS (sore throat, sores in mouth, or a toothache) UNUSUAL RASH, SWELLING OR PAIN  UNUSUAL VAGINAL DISCHARGE OR ITCHING   Items with * indicate a potential emergency and should be followed up as soon as possible or go to the Emergency Department if any problems should occur.  Please show the CHEMOTHERAPY ALERT CARD or IMMUNOTHERAPY ALERT CARD at check-in to the  Emergency Department and triage nurse.  Should you have questions after your visit or need to cancel or reschedule your appointment, please contact [facility] MEDICAL ONCOLOGY  Dept: [phone]  and follow the prompts.  Office hours are 8:00 a.m. to 4:30 p.m. Monday - Friday. Please note that voicemails left after 4:00 p.m. may not be returned until the following business day.  We are closed weekends and major holidays. You have access to a nurse at all times for urgent questions. Please call the main number to the clinic Dept: [phone] and follow the prompts.   For any non-urgent questions, you may also contact your provider using MyChart. We now offer e-Visits for anyone 18 and older to request care online for non-urgent symptoms. For details visit [email or web address].   Also download the MyChart app! Go to the app store, search "MyChart", open the app, select [facility], and log in with your MyChart username and password.  Due to Covid, a mask is required upon entering the hospital/clinic. If you do not have a mask, one will be given to you upon arrival. For doctor visits, patients may have 1 support person aged 18 or older with them. For treatment visits, patients cannot have anyone with them due to current Covid guidelines and our immunocompromised population.   

## 2022-01-23 ENCOUNTER — Telehealth: Payer: Self-pay | Admitting: *Deleted

## 2022-01-23 NOTE — Telephone Encounter (Signed)
Called pt to see how she did with her recent treatment. She reports not sleeping well last night but had a lot of energy today.  She denies any other problems.  She reports knowing how to reach [place] if needed.  ?

## 2022-01-23 NOTE — Telephone Encounter (Signed)
-----   Message from Tildon Husky, RN sent at 01/22/2022  2:04 PM EDT ----- ?Regarding: first time treatment follow up call- Burr Medico  Taxol ?Patient received taxol today for the first time. She is seen by Dr. Burr Medico. No issues with treatment  ? ?

## 2022-01-24 ENCOUNTER — Telehealth: Payer: Self-pay | Admitting: Hematology

## 2022-01-24 ENCOUNTER — Other Ambulatory Visit: Payer: Self-pay | Admitting: Hematology

## 2022-01-24 ENCOUNTER — Other Ambulatory Visit: Payer: Self-pay

## 2022-01-24 NOTE — Telephone Encounter (Signed)
Left message with follow-up appointments per 4/3 los. ?

## 2022-01-29 ENCOUNTER — Inpatient Hospital Stay: Payer: Managed Care, Other (non HMO)

## 2022-01-29 ENCOUNTER — Ambulatory Visit: Payer: Managed Care, Other (non HMO) | Admitting: Hematology

## 2022-01-29 ENCOUNTER — Other Ambulatory Visit: Payer: Managed Care, Other (non HMO)

## 2022-01-29 ENCOUNTER — Other Ambulatory Visit: Payer: Self-pay

## 2022-01-29 ENCOUNTER — Ambulatory Visit: Payer: Managed Care, Other (non HMO)

## 2022-01-29 VITALS — BP 149/83 | HR 96 | Temp 98.3°F | Resp 18 | Wt 180.5 lb

## 2022-01-29 DIAGNOSIS — C50212 Malignant neoplasm of upper-inner quadrant of left female breast: Secondary | ICD-10-CM

## 2022-01-29 DIAGNOSIS — Z170 Estrogen receptor positive status [ER+]: Secondary | ICD-10-CM

## 2022-01-29 DIAGNOSIS — Z95828 Presence of other vascular implants and grafts: Secondary | ICD-10-CM

## 2022-01-29 DIAGNOSIS — Z5111 Encounter for antineoplastic chemotherapy: Secondary | ICD-10-CM | POA: Diagnosis not present

## 2022-01-29 LAB — CBC WITH DIFFERENTIAL (CANCER CENTER ONLY)
Abs Immature Granulocytes: 0.18 10*3/uL — ABNORMAL HIGH (ref 0.00–0.07)
Basophils Absolute: 0.1 10*3/uL (ref 0.0–0.1)
Basophils Relative: 1 %
Eosinophils Absolute: 1.4 10*3/uL — ABNORMAL HIGH (ref 0.0–0.5)
Eosinophils Relative: 22 %
HCT: 27.7 % — ABNORMAL LOW (ref 36.0–46.0)
Hemoglobin: 9.3 g/dL — ABNORMAL LOW (ref 12.0–15.0)
Immature Granulocytes: 3 %
Lymphocytes Relative: 10 %
Lymphs Abs: 0.7 10*3/uL (ref 0.7–4.0)
MCH: 27.8 pg (ref 26.0–34.0)
MCHC: 33.6 g/dL (ref 30.0–36.0)
MCV: 82.9 fL (ref 80.0–100.0)
Monocytes Absolute: 0.4 10*3/uL (ref 0.1–1.0)
Monocytes Relative: 6 %
Neutro Abs: 3.9 10*3/uL (ref 1.7–7.7)
Neutrophils Relative %: 58 %
Platelet Count: 259 10*3/uL (ref 150–400)
RBC: 3.34 MIL/uL — ABNORMAL LOW (ref 3.87–5.11)
RDW: 16.7 % — ABNORMAL HIGH (ref 11.5–15.5)
WBC Count: 6.7 10*3/uL (ref 4.0–10.5)
nRBC: 0.5 % — ABNORMAL HIGH (ref 0.0–0.2)

## 2022-01-29 LAB — CMP (CANCER CENTER ONLY)
ALT: 32 U/L (ref 0–44)
AST: 19 U/L (ref 15–41)
Albumin: 3.9 g/dL (ref 3.5–5.0)
Alkaline Phosphatase: 115 U/L (ref 38–126)
Anion gap: 6 (ref 5–15)
BUN: 12 mg/dL (ref 8–23)
CO2: 26 mmol/L (ref 22–32)
Calcium: 9 mg/dL (ref 8.9–10.3)
Chloride: 108 mmol/L (ref 98–111)
Creatinine: 0.71 mg/dL (ref 0.44–1.00)
GFR, Estimated: >60 mL/min (ref >60)
Glucose, Bld: 124 mg/dL — ABNORMAL HIGH (ref 70–99)
Potassium: 3.7 mmol/L (ref 3.5–5.1)
Sodium: 140 mmol/L (ref 135–145)
Total Bilirubin: 0.3 mg/dL (ref 0.3–1.2)
Total Protein: 6.8 g/dL (ref 6.5–8.1)

## 2022-01-29 MED ORDER — HEPARIN SOD (PORK) LOCK FLUSH 100 UNIT/ML IV SOLN
500.0000 [IU] | Freq: Once | INTRAVENOUS | Status: AC | PRN
  Administered 2022-01-29: 500 [IU]

## 2022-01-29 MED ORDER — DIPHENHYDRAMINE HCL 50 MG/ML IJ SOLN
25.0000 mg | Freq: Once | INTRAMUSCULAR | Status: AC
  Administered 2022-01-29: 25 mg via INTRAVENOUS
  Filled 2022-01-29: qty 1

## 2022-01-29 MED ORDER — SODIUM CHLORIDE 0.9 % IV SOLN
10.0000 mg | Freq: Once | INTRAVENOUS | Status: AC
  Administered 2022-01-29: 10 mg via INTRAVENOUS
  Filled 2022-01-29: qty 10

## 2022-01-29 MED ORDER — FAMOTIDINE IN NACL 20-0.9 MG/50ML-% IV SOLN
20.0000 mg | Freq: Once | INTRAVENOUS | Status: AC
  Administered 2022-01-29: 20 mg via INTRAVENOUS
  Filled 2022-01-29: qty 50

## 2022-01-29 MED ORDER — SODIUM CHLORIDE 0.9 % IV SOLN
80.0000 mg/m2 | Freq: Once | INTRAVENOUS | Status: AC
  Administered 2022-01-29: 156 mg via INTRAVENOUS
  Filled 2022-01-29: qty 26

## 2022-01-29 MED ORDER — SODIUM CHLORIDE 0.9 % IV SOLN: Freq: Once | INTRAVENOUS | Status: AC

## 2022-01-29 MED ORDER — SODIUM CHLORIDE 0.9% FLUSH
10.0000 mL | INTRAVENOUS | Status: DC | PRN
  Administered 2022-01-29: 10 mL

## 2022-01-29 MED ORDER — SODIUM CHLORIDE 0.9% FLUSH
10.0000 mL | Freq: Once | INTRAVENOUS | Status: AC
  Administered 2022-01-29: 10 mL

## 2022-01-29 NOTE — Patient Instructions (Addendum)
[facility]  Discharge Instructions: Thank you for choosing [facility] to provide your oncology and hematology care.   If you have a lab appointment with the Cancer Center, please go directly to the Cancer Center and check in at the registration area.   Wear comfortable clothing and clothing appropriate for easy access to any Portacath or PICC line.   We strive to give you quality time with your provider. You may need to reschedule your appointment if you arrive late (15 or more minutes).  Arriving late affects you and other patients whose appointments are after yours.  Also, if you miss three or more appointments without notifying the office, you may be dismissed from the clinic at the provider's discretion.      For prescription refill requests, have your pharmacy contact our office and allow 72 hours for refills to be completed.    Today you received the following chemotherapy and/or immunotherapy agents taxol      To help prevent nausea and vomiting after your treatment, we encourage you to take your nausea medication as directed.  BELOW ARE SYMPTOMS THAT SHOULD BE REPORTED IMMEDIATELY: *FEVER GREATER THAN 100.4 F (38 C) OR HIGHER *CHILLS OR SWEATING *NAUSEA AND VOMITING THAT IS NOT CONTROLLED WITH YOUR NAUSEA MEDICATION *UNUSUAL SHORTNESS OF BREATH *UNUSUAL BRUISING OR BLEEDING *URINARY PROBLEMS (pain or burning when urinating, or frequent urination) *BOWEL PROBLEMS (unusual diarrhea, constipation, pain near the anus) TENDERNESS IN MOUTH AND THROAT WITH OR WITHOUT PRESENCE OF ULCERS (sore throat, sores in mouth, or a toothache) UNUSUAL RASH, SWELLING OR PAIN  UNUSUAL VAGINAL DISCHARGE OR ITCHING   Items with * indicate a potential emergency and should be followed up as soon as possible or go to the Emergency Department if any problems should occur.  Please show the CHEMOTHERAPY ALERT CARD or IMMUNOTHERAPY ALERT CARD at check-in to the  Emergency Department and triage nurse.  Should you have questions after your visit or need to cancel or reschedule your appointment, please contact [facility]  Dept: [phone]  and follow the prompts.  Office hours are 8:00 a.m. to 4:30 p.m. Monday - Friday. Please note that voicemails left after 4:00 p.m. may not be returned until the following business day.  We are closed weekends and major holidays. You have access to a nurse at all times for urgent questions. Please call the main number to the clinic Dept: [phone] and follow the prompts.   For any non-urgent questions, you may also contact your provider using MyChart. We now offer e-Visits for anyone 18 and older to request care online for non-urgent symptoms. For details visit [email or web address].   Also download the MyChart app! Go to the app store, search "MyChart", open the app, select [facility], and log in with your MyChart username and password.  Due to Covid, a mask is required upon entering the hospital/clinic. If you do not have a mask, one will be given to you upon arrival. For doctor visits, patients may have 1 support person aged 18 or older with them. For treatment visits, patients cannot have anyone with them due to current Covid guidelines and our immunocompromised population.   

## 2022-01-30 ENCOUNTER — Other Ambulatory Visit: Payer: Self-pay

## 2022-01-31 ENCOUNTER — Ambulatory Visit: Payer: Managed Care, Other (non HMO)

## 2022-02-01 ENCOUNTER — Encounter: Payer: Self-pay | Admitting: *Deleted

## 2022-02-02 ENCOUNTER — Encounter: Payer: Self-pay | Admitting: Hematology

## 2022-02-02 MED FILL — Dexamethasone Sodium Phosphate Inj 100 MG/10ML: INTRAMUSCULAR | Qty: 1 | Status: AC

## 2022-02-05 ENCOUNTER — Inpatient Hospital Stay: Payer: Managed Care, Other (non HMO)

## 2022-02-05 ENCOUNTER — Encounter: Payer: Self-pay | Admitting: Hematology

## 2022-02-05 ENCOUNTER — Other Ambulatory Visit: Payer: Self-pay

## 2022-02-05 ENCOUNTER — Inpatient Hospital Stay: Payer: Managed Care, Other (non HMO) | Admitting: Hematology

## 2022-02-05 VITALS — BP 134/69 | HR 92 | Temp 98.5°F | Resp 18 | Ht 65.0 in | Wt 179.1 lb

## 2022-02-05 DIAGNOSIS — C50212 Malignant neoplasm of upper-inner quadrant of left female breast: Secondary | ICD-10-CM

## 2022-02-05 DIAGNOSIS — Z170 Estrogen receptor positive status [ER+]: Secondary | ICD-10-CM

## 2022-02-05 DIAGNOSIS — Z5111 Encounter for antineoplastic chemotherapy: Secondary | ICD-10-CM | POA: Diagnosis not present

## 2022-02-05 DIAGNOSIS — Z95828 Presence of other vascular implants and grafts: Secondary | ICD-10-CM

## 2022-02-05 LAB — CMP (CANCER CENTER ONLY)
ALT: 29 U/L (ref 0–44)
AST: 19 U/L (ref 15–41)
Albumin: 4 g/dL (ref 3.5–5.0)
Alkaline Phosphatase: 101 U/L (ref 38–126)
Anion gap: 7 (ref 5–15)
BUN: 13 mg/dL (ref 8–23)
CO2: 26 mmol/L (ref 22–32)
Calcium: 9 mg/dL (ref 8.9–10.3)
Chloride: 107 mmol/L (ref 98–111)
Creatinine: 0.82 mg/dL (ref 0.44–1.00)
GFR, Estimated: >60 mL/min (ref >60)
Glucose, Bld: 142 mg/dL — ABNORMAL HIGH (ref 70–99)
Potassium: 3.6 mmol/L (ref 3.5–5.1)
Sodium: 140 mmol/L (ref 135–145)
Total Bilirubin: 0.4 mg/dL (ref 0.3–1.2)
Total Protein: 6.8 g/dL (ref 6.5–8.1)

## 2022-02-05 LAB — CBC WITH DIFFERENTIAL (CANCER CENTER ONLY)
Abs Immature Granulocytes: 0.08 10*3/uL — ABNORMAL HIGH (ref 0.00–0.07)
Basophils Absolute: 0 10*3/uL (ref 0.0–0.1)
Basophils Relative: 1 %
Eosinophils Absolute: 1.4 10*3/uL — ABNORMAL HIGH (ref 0.0–0.5)
Eosinophils Relative: 37 %
HCT: 26.9 % — ABNORMAL LOW (ref 36.0–46.0)
Hemoglobin: 9 g/dL — ABNORMAL LOW (ref 12.0–15.0)
Immature Granulocytes: 2 %
Lymphocytes Relative: 14 %
Lymphs Abs: 0.5 10*3/uL — ABNORMAL LOW (ref 0.7–4.0)
MCH: 27.9 pg (ref 26.0–34.0)
MCHC: 33.5 g/dL (ref 30.0–36.0)
MCV: 83.3 fL (ref 80.0–100.0)
Monocytes Absolute: 0.2 10*3/uL (ref 0.1–1.0)
Monocytes Relative: 6 %
Neutro Abs: 1.5 10*3/uL — ABNORMAL LOW (ref 1.7–7.7)
Neutrophils Relative %: 40 %
Platelet Count: 204 10*3/uL (ref 150–400)
RBC: 3.23 MIL/uL — ABNORMAL LOW (ref 3.87–5.11)
RDW: 17.6 % — ABNORMAL HIGH (ref 11.5–15.5)
WBC Count: 3.8 10*3/uL — ABNORMAL LOW (ref 4.0–10.5)
nRBC: 0 % (ref 0.0–0.2)

## 2022-02-05 MED ORDER — DIPHENHYDRAMINE HCL 50 MG/ML IJ SOLN
25.0000 mg | Freq: Once | INTRAMUSCULAR | Status: AC
  Administered 2022-02-05: 25 mg via INTRAVENOUS
  Filled 2022-02-05: qty 1

## 2022-02-05 MED ORDER — FAMOTIDINE IN NACL 20-0.9 MG/50ML-% IV SOLN
20.0000 mg | Freq: Once | INTRAVENOUS | Status: AC
  Administered 2022-02-05: 20 mg via INTRAVENOUS
  Filled 2022-02-05: qty 50

## 2022-02-05 MED ORDER — SODIUM CHLORIDE 0.9% FLUSH
10.0000 mL | INTRAVENOUS | Status: DC | PRN
  Administered 2022-02-05: 10 mL

## 2022-02-05 MED ORDER — SODIUM CHLORIDE 0.9 % IV SOLN: 5.0000 mg | Freq: Once | INTRAVENOUS | Status: DC

## 2022-02-05 MED ORDER — DEXAMETHASONE SODIUM PHOSPHATE 10 MG/ML IJ SOLN
5.0000 mg | Freq: Once | INTRAMUSCULAR | Status: AC
  Administered 2022-02-05: 5 mg via INTRAVENOUS
  Filled 2022-02-05: qty 1

## 2022-02-05 MED ORDER — SODIUM CHLORIDE 0.9 % IV SOLN
80.0000 mg/m2 | Freq: Once | INTRAVENOUS | Status: AC
  Administered 2022-02-05: 156 mg via INTRAVENOUS
  Filled 2022-02-05: qty 26

## 2022-02-05 MED ORDER — SODIUM CHLORIDE 0.9% FLUSH
10.0000 mL | Freq: Once | INTRAVENOUS | Status: AC
  Administered 2022-02-05: 10 mL

## 2022-02-05 MED ORDER — SODIUM CHLORIDE 0.9 % IV SOLN: Freq: Once | INTRAVENOUS | Status: AC

## 2022-02-05 MED ORDER — HEPARIN SOD (PORK) LOCK FLUSH 100 UNIT/ML IV SOLN
500.0000 [IU] | Freq: Once | INTRAVENOUS | Status: AC | PRN
  Administered 2022-02-05: 500 [IU]

## 2022-02-05 NOTE — Patient Instructions (Signed)
[facility]   Discharge Instructions: Thank you for choosing [facility] to provide your oncology and hematology care.   If you have a lab appointment with the Cancer Center, please go directly to the Cancer Center and check in at the registration area.   Wear comfortable clothing and clothing appropriate for easy access to any Portacath or PICC line.   We strive to give you quality time with your provider. You may need to reschedule your appointment if you arrive late (15 or more minutes).  Arriving late affects you and other patients whose appointments are after yours.  Also, if you miss three or more appointments without notifying the office, you may be dismissed from the clinic at the provider's discretion.      For prescription refill requests, have your pharmacy contact our office and allow 72 hours for refills to be completed.    Today you received the following chemotherapy and/or immunotherapy agents: paclitaxel.      To help prevent nausea and vomiting after your treatment, we encourage you to take your nausea medication as directed.  BELOW ARE SYMPTOMS THAT SHOULD BE REPORTED IMMEDIATELY: *FEVER GREATER THAN 100.4 F (38 C) OR HIGHER *CHILLS OR SWEATING *NAUSEA AND VOMITING THAT IS NOT CONTROLLED WITH YOUR NAUSEA MEDICATION *UNUSUAL SHORTNESS OF BREATH *UNUSUAL BRUISING OR BLEEDING *URINARY PROBLEMS (pain or burning when urinating, or frequent urination) *BOWEL PROBLEMS (unusual diarrhea, constipation, pain near the anus) TENDERNESS IN MOUTH AND THROAT WITH OR WITHOUT PRESENCE OF ULCERS (sore throat, sores in mouth, or a toothache) UNUSUAL RASH, SWELLING OR PAIN  UNUSUAL VAGINAL DISCHARGE OR ITCHING   Items with * indicate a potential emergency and should be followed up as soon as possible or go to the Emergency Department if any problems should occur.  Please show the CHEMOTHERAPY ALERT CARD or IMMUNOTHERAPY ALERT CARD at check-in  to the Emergency Department and triage nurse.  Should you have questions after your visit or need to cancel or reschedule your appointment, please contact [facility]  Dept: [phone]  and follow the prompts.  Office hours are 8:00 a.m. to 4:30 p.m. Monday - Friday. Please note that voicemails left after 4:00 p.m. may not be returned until the following business day.  We are closed weekends and major holidays. You have access to a nurse at all times for urgent questions. Please call the main number to the clinic Dept: [phone] and follow the prompts.   For any non-urgent questions, you may also contact your provider using MyChart. We now offer e-Visits for anyone 18 and older to request care online for non-urgent symptoms. For details visit [email or web address].   Also download the MyChart app! Go to the app store, search "MyChart", open the app, select [facility], and log in with your MyChart username and password.  Due to Covid, a mask is required upon entering the hospital/clinic. If you do not have a mask, one will be given to you upon arrival. For doctor visits, patients may have 1 support person aged 18 or older with them. For treatment visits, patients cannot have anyone with them due to current Covid guidelines and our immunocompromised population.   

## 2022-02-05 NOTE — Progress Notes (Signed)
?[facility]   ?[phone] [phone]   ?Clinic Follow up Note  ? ?Patient Care Team: ?Everardo Beals, NP as PCP - General ?Erroll Luna, MD as Consulting Physician (General Surgery) ?Truitt Merle, MD as Consulting Physician (Hematology) ?Gery Pray, MD as Consulting Physician (Radiation Oncology) ?[name], RN as Oncology Nurse Navigator ?[name], RN as Oncology Nurse Navigator ? ?Date of Service:  02/05/2022 ? ?CHIEF COMPLAINT: f/u of left breast cancer ? ?CURRENT THERAPY:  ?Neoadjuvant Taxol, weekly, starting 01/22/22 ? ?ASSESSMENT & PLAN:  ?Cassandra Brennan is a 66 y.o. post-menopausal female with  ? ?1. 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 B/l diagnostic MM and left [place] 10/19/21 showed: 2 cm mass at 10 o'clock; 5 mm indeterminate satellite mass at 12 o'clock; palpable left axilla mass corresponds with large lymph node. Biopsy 10/27/21 confirmed invasive ductal carcinoma with necrosis to both 10 o'clock and lymph node, grade 3. ER weakly positive (30%). ?-breast MRI 11/15/21 shows the biopsy proven malignancy in the upper inner left breat measuring 1.9 cm, a biopsy proven malignant 4.3 cm left axillary LN and 2 other abnormal LNs measuring 1.3 and 1.5 cm, and a 1.3 liver mass likely representing a cyst.  ?-CT CAP 11/23/21 shows known left breast cancer and bulky axillary adenopathy, a liver cyst, and tiny indeterminate liver nodules too small to characterize. We will monitor these in the future. No evidence of distant metastasis.  ?-Bone scan is negative for osseous metastasis.  ?-Baseline echo on 11/22/21 is normal, LVEF 55-60%. ?-Left breast mass in the upper inner quadrant measured 2 cm and 2 palpable lymph nodes largest measuring 4.5 cm on 2/7 the day she began neoadjuvant chemo. ?-She received 4 cycles of neoadjuvant AC 09/27/21  - 01/08/22, tolerated moderate well with fatigue and nausea. Breast mass and adenopathy are no longer palpable. ?-she moved to weekly taxol on 01/22/22. She is tolerating much better than AC. ?-Lab reviewed, adequate for treatment, will proceed week 3 Taxol today ?-She has a mild neutropenia, will consider short acting G-CSF if needed down the road ? -She has had excellent clinical response, her breast mass and axillary adenopathy are not palpable anymore.  We will plan to repeat MRI at the end of neoadjuvant chemo ? ?2. Anemia ?-secondary to chemo, s/p blood transfusion 01/18/22 ?-hgb stable at 9 today (02/05/22) ?-will monitor ? ?3. Hypertension and anxiety ?-Continue medications and f/u with PCP ? ? ?PLAN: ?-proceed with week 3 taxol today and continue weekly ?-f/u every other week as scheduled ? ? ?No problem-specific Assessment & Plan notes found for this encounter. ? ? ?SUMMARY OF ONCOLOGIC HISTORY: ?Oncology History Overview Note  ? Cancer Staging  ?Malignant neoplasm of upper-inner quadrant of left breast in female, estrogen receptor positive ([place]) ?Staging form: Breast, AJCC 8th Edition ?- Clinical stage from 10/27/2021: Stage IIB (cT1c, cN1, cM0, G3, ER+, PR-, HER2-) - Signed by Truitt Merle, MD on 11/07/2021 ? ?  ?Malignant neoplasm of upper-inner quadrant of left breast in female, estrogen receptor positive ([place])  ?10/19/2021 Mammogram  ? CLINICAL DATA:  Patient presents for palpable left axillary ?abnormality. ?  ?EXAM: ?DIGITAL DIAGNOSTIC BILATERAL MAMMOGRAM WITH TOMOSYNTHESIS AND CAD; ?ULTRASOUND LEFT BREAST LIMITED ? ?IMPRESSION: ?Suspicious left breast mass 10 o'clock position. ?  ?Adjacent suspicious satellite nodule left breast 12 o'clock ?position. ?  ?Palpable mass left axilla corresponds with  a large lymph node. ?  ?10/27/2021 Initial Biopsy  ? Diagnosis ?1. Breast, left, needle core biopsy, 12 o'clock, ribbon ?- FIBROADENOMA ?- NO MALIGNANCY IDENTIFIED ?2. Breast, left, needle core biopsy, 10 o'clock,  coil ?- INVASIVE DUCTAL CARCINOMA WITH NECROSIS ?- DUCTAL CARCINOMA IN SITU ?- SEE COMMENT ?3. Lymph node, needle/core biopsy, left axilla, tribell ?- INVASIVE DUCTAL CARCINOMA WITH NECROSIS ?- NO NODAL TISSUE IDENTIFIED ?- SEE COMMENT ?Microscopic Comment ?2. and 2. Based on the biopsy, the carcinoma appears Nottingham grade 3 of 3 and measures 0.8 cm in greatest linear ?extent. ? ?3. PROGNOSTIC INDICATORS ?Results: ?The tumor cells are NEGATIVE for Her2 (1+). ?Estrogen Receptor: 30%, POSITIVE, WEAK STAINING INTENSITY ?Progesterone Receptor: 0%, NEGATIVE ?Proliferation Marker Ki67: 60% ?  ?10/27/2021 Cancer Staging  ? Staging form: Breast, AJCC 8th Edition ?- Clinical stage from 10/27/2021: Stage IIB (cT1c, cN1, cM0, G3, ER+, PR-, HER2-) - Signed by [name], NP on 11/28/2021 ?Stage prefix: Initial diagnosis ?Histologic grading system: 3 grade system ? ?  ?11/06/2021 Initial Diagnosis  ? Malignant neoplasm of upper-inner quadrant of left breast in female, estrogen receptor positive ([place]) ? ?  ?11/15/2021 Breast MRI  ? IMPRESSION: ?1.9 cm mass in the upper-inner quadrant of the left breast corresponding with the known invasive ductal carcinoma. Enlarged left axillary lymph node corresponding with known metastatic disease. ?  ?RECOMMENDATION: ?Treatment planning of the known left breast cancer and axillary metastasis is recommended. ?  ?Additional imaging evaluation of the mass in the liver is recommended with CT with liver mass protocol or MRI. ?  ?11/22/2021 Imaging  ? Bone scan IMPRESSION: ?No scintigraphic evidence of bony metastatic disease. Degenerative changes as above. ?  ?11/22/2021 Echocardiogram  ? Baseline echo LVEF 55-60%, normal GLS -21.7% ?  ?11/23/2021 Imaging  ? CT CAP IMPRESSION: ?1. Bulky lymphadenopathy in the left axilla including a 5.0 x 2.8 cm lymph node. ?2. 15 mm nodule identified in the upper inner quadrant of the left breast. ?3. No suspicious pulmonary nodule or mass. No evidence for metastatic  disease in the abdomen or pelvis. ?4. 11 mm cyst in the dome of the left liver. Adjacent tiny hypoattenuating lesions in the right liver are too small to characterize, but likely benign. Attention on follow-up recommended. ?5. Prominent stool volume raises the question of clinical constipation. ?  ?11/28/2021 -  Chemotherapy  ? Patient is on Treatment Plan : BREAST ADJUVANT DOSE DENSE AC q14d / PACLitaxel q7d  ? ?  ?  ? Genetic Testing  ? [facility] CancerNext was Negative. Report date is 11/26/2021.  ? ?The CancerNext gene panel offered by [employer] includes sequencing, rearrangement analysis, and RNA analysis for the following 36 genes:   APC, ATM, AXIN2, BARD1, BMPR1A, BRCA1, BRCA2, BRIP1, CDH1, CDK4, CDKN2A, CHEK2, DICER1, HOXB13, EPCAM, GREM1, MLH1, MSH2, MSH3, MSH6, MUTYH, NBN, NF1, NTHL1, PALB2, PMS2, POLD1, POLE, PTEN, RAD51C, RAD51D, RECQL, SMAD4, SMARCA4, STK11, and TP53.  ?  ? ? ? ?INTERVAL HISTORY:  ?Cassandra Brennan is here for a follow up of breast cancer. She was last seen by NP Lacie on 01/22/22. She presents to the clinic alone. ?She reports she is doing well overall, tolerating taxol better than AC. She expressed some concern with elevated heart rate and wonders if she can still exercise. I reassured her this is okay. ?  ?All other systems were reviewed with the patient and are negative. ? ?MEDICAL HISTORY:  ?Past Medical History:  ?Diagnosis Date  ? Anxiety   ? Arthritis   ?  right knee   ? GERD (gastroesophageal reflux disease)   ? Hyperlipidemia   ? Hypertension   ? Positive colorectal cancer screening using Cologuard test   ? ? ?SURGICAL HISTORY: ?Past Surgical History:  ?Procedure Laterality Date  ? BREAST CYST EXCISION Right   ? pt stated in 1980  ? BREAST SURGERY    ? other GI testing    ? unsure but had to drink a chalky drink   ? PORTACATH PLACEMENT N/A 11/27/2021  ? Procedure: INSERTION PORT-A-CATH;  Surgeon: Erroll Luna, MD;  Location: [facility];  Service: General;  Laterality: N/A;  ?  TONSILLECTOMY    ? TUBAL LIGATION    ? ? ?I have reviewed the social history and family history with the patient and they are unchanged from previous note. ? ?ALLERGIES:  is allergic to prednisone, rosuvastatin, othe

## 2022-02-06 ENCOUNTER — Telehealth: Payer: Self-pay | Admitting: Hematology

## 2022-02-06 NOTE — Telephone Encounter (Signed)
Scheduled follow-up appointments per 4/17 los. Patient is aware. ?

## 2022-02-12 ENCOUNTER — Other Ambulatory Visit: Payer: Self-pay

## 2022-02-12 ENCOUNTER — Inpatient Hospital Stay: Payer: Managed Care, Other (non HMO)

## 2022-02-12 VITALS — BP 132/73 | HR 98 | Temp 98.3°F | Resp 16 | Ht 65.0 in | Wt 181.8 lb

## 2022-02-12 DIAGNOSIS — Z170 Estrogen receptor positive status [ER+]: Secondary | ICD-10-CM

## 2022-02-12 DIAGNOSIS — Z5111 Encounter for antineoplastic chemotherapy: Secondary | ICD-10-CM | POA: Diagnosis not present

## 2022-02-12 DIAGNOSIS — Z95828 Presence of other vascular implants and grafts: Secondary | ICD-10-CM

## 2022-02-12 LAB — CMP (CANCER CENTER ONLY)
ALT: 24 U/L (ref 0–44)
AST: 17 U/L (ref 15–41)
Albumin: 3.8 g/dL (ref 3.5–5.0)
Alkaline Phosphatase: 91 U/L (ref 38–126)
Anion gap: 8 (ref 5–15)
BUN: 13 mg/dL (ref 8–23)
CO2: 24 mmol/L (ref 22–32)
Calcium: 8.6 mg/dL — ABNORMAL LOW (ref 8.9–10.3)
Chloride: 109 mmol/L (ref 98–111)
Creatinine: 0.71 mg/dL (ref 0.44–1.00)
GFR, Estimated: >60 mL/min (ref >60)
Glucose, Bld: 127 mg/dL — ABNORMAL HIGH (ref 70–99)
Potassium: 3.5 mmol/L (ref 3.5–5.1)
Sodium: 141 mmol/L (ref 135–145)
Total Bilirubin: 0.4 mg/dL (ref 0.3–1.2)
Total Protein: 6.4 g/dL — ABNORMAL LOW (ref 6.5–8.1)

## 2022-02-12 LAB — CBC WITH DIFFERENTIAL (CANCER CENTER ONLY)
Abs Immature Granulocytes: 0.08 10*3/uL — ABNORMAL HIGH (ref 0.00–0.07)
Basophils Absolute: 0 10*3/uL (ref 0.0–0.1)
Basophils Relative: 1 %
Eosinophils Absolute: 1 10*3/uL — ABNORMAL HIGH (ref 0.0–0.5)
Eosinophils Relative: 34 %
HCT: 25.2 % — ABNORMAL LOW (ref 36.0–46.0)
Hemoglobin: 8.4 g/dL — ABNORMAL LOW (ref 12.0–15.0)
Immature Granulocytes: 3 %
Lymphocytes Relative: 20 %
Lymphs Abs: 0.6 10*3/uL — ABNORMAL LOW (ref 0.7–4.0)
MCH: 28.2 pg (ref 26.0–34.0)
MCHC: 33.3 g/dL (ref 30.0–36.0)
MCV: 84.6 fL (ref 80.0–100.0)
Monocytes Absolute: 0.2 10*3/uL (ref 0.1–1.0)
Monocytes Relative: 7 %
Neutro Abs: 1 10*3/uL — ABNORMAL LOW (ref 1.7–7.7)
Neutrophils Relative %: 35 %
Platelet Count: 205 10*3/uL (ref 150–400)
RBC: 2.98 MIL/uL — ABNORMAL LOW (ref 3.87–5.11)
RDW: 18.3 % — ABNORMAL HIGH (ref 11.5–15.5)
WBC Count: 2.8 10*3/uL — ABNORMAL LOW (ref 4.0–10.5)
nRBC: 0 % (ref 0.0–0.2)

## 2022-02-12 MED ORDER — FAMOTIDINE IN NACL 20-0.9 MG/50ML-% IV SOLN
20.0000 mg | Freq: Once | INTRAVENOUS | Status: AC
  Administered 2022-02-12: 20 mg via INTRAVENOUS
  Filled 2022-02-12: qty 50

## 2022-02-12 MED ORDER — SODIUM CHLORIDE 0.9% FLUSH
10.0000 mL | Freq: Once | INTRAVENOUS | Status: AC
  Administered 2022-02-12: 10 mL

## 2022-02-12 MED ORDER — SODIUM CHLORIDE 0.9 % IV SOLN
80.0000 mg/m2 | Freq: Once | INTRAVENOUS | Status: AC
  Administered 2022-02-12: 156 mg via INTRAVENOUS
  Filled 2022-02-12: qty 26

## 2022-02-12 MED ORDER — SODIUM CHLORIDE 0.9 % IV SOLN: Freq: Once | INTRAVENOUS | Status: AC

## 2022-02-12 MED ORDER — DEXAMETHASONE SODIUM PHOSPHATE 10 MG/ML IJ SOLN
5.0000 mg | Freq: Once | INTRAMUSCULAR | Status: AC
  Administered 2022-02-12: 5 mg via INTRAVENOUS
  Filled 2022-02-12: qty 1

## 2022-02-12 MED ORDER — DIPHENHYDRAMINE HCL 50 MG/ML IJ SOLN
25.0000 mg | Freq: Once | INTRAMUSCULAR | Status: AC
  Administered 2022-02-12: 25 mg via INTRAVENOUS
  Filled 2022-02-12: qty 1

## 2022-02-12 MED ORDER — HEPARIN SOD (PORK) LOCK FLUSH 100 UNIT/ML IV SOLN
500.0000 [IU] | Freq: Once | INTRAVENOUS | Status: AC | PRN
  Administered 2022-02-12: 500 [IU]

## 2022-02-12 MED ORDER — SODIUM CHLORIDE 0.9% FLUSH
10.0000 mL | INTRAVENOUS | Status: DC | PRN
  Administered 2022-02-12: 10 mL

## 2022-02-12 NOTE — Patient Instructions (Signed)
[facility]  Discharge Instructions: ?Thank you for choosing [facility] to provide your oncology and hematology care.  ? ?If you have a lab appointment with the [facility], please go directly to the [facility] and check in at the registration area. ?  ?Wear comfortable clothing and clothing appropriate for easy access to any Portacath or PICC line.  ? ?We strive to give you quality time with your provider. You may need to reschedule your appointment if you arrive late (15 or more minutes).  Arriving late affects you and other patients whose appointments are after yours.  Also, if you miss three or more appointments without notifying the office, you may be dismissed from the clinic at the provider?s discretion.    ?  ?For prescription refill requests, have your pharmacy contact our office and allow 72 hours for refills to be completed.   ? ?Today you received the following chemotherapy and/or immunotherapy agents :  Taxol.  ?  ?To help prevent nausea and vomiting after your treatment, we encourage you to take your nausea medication as directed. ? ?BELOW ARE SYMPTOMS THAT SHOULD BE REPORTED IMMEDIATELY: ?*FEVER GREATER THAN 100.4 F (38 ?C) OR HIGHER ?*CHILLS OR SWEATING ?*NAUSEA AND VOMITING THAT IS NOT CONTROLLED WITH YOUR NAUSEA MEDICATION ?*UNUSUAL SHORTNESS OF BREATH ?*UNUSUAL BRUISING OR BLEEDING ?*URINARY PROBLEMS (pain or burning when urinating, or frequent urination) ?*BOWEL PROBLEMS (unusual diarrhea, constipation, pain near the anus) ?TENDERNESS IN MOUTH AND THROAT WITH OR WITHOUT PRESENCE OF ULCERS (sore throat, sores in mouth, or a toothache) ?UNUSUAL RASH, SWELLING OR PAIN  ?UNUSUAL VAGINAL DISCHARGE OR ITCHING  ? ?Items with * indicate a potential emergency and should be followed up as soon as possible or go to the Emergency Department if any problems should occur. ? ?Please show the CHEMOTHERAPY ALERT CARD or IMMUNOTHERAPY ALERT CARD at check-in to the  Emergency Department and triage nurse. ? ?Should you have questions after your visit or need to cancel or reschedule your appointment, please contact [facility]  Dept: [phone]  and follow the prompts.  Office hours are 8:00 a.m. to 4:30 p.m. Monday - Friday. Please note that voicemails left after 4:00 p.m. may not be returned until the following business day.  We are closed weekends and major holidays. You have access to a nurse at all times for urgent questions. Please call the main number to the clinic Dept: [phone] and follow the prompts. ? ? ?For any non-urgent questions, you may also contact your provider using MyChart. We now offer e-Visits for anyone 60 and older to request care online for non-urgent symptoms. For details visit [email or web address]. ?  ?Also download the MyChart app! Go to the app store, search "MyChart", open the app, select [facility], and log in with your MyChart username and password. ? ?Due to Covid, a mask is required upon entering the hospital/clinic. If you do not have a mask, one will be given to you upon arrival. For doctor visits, patients may have 1 support person aged 31 or older with them. For treatment visits, patients cannot have anyone with them due to current Covid guidelines and our immunocompromised population.  ? ?

## 2022-02-12 NOTE — Progress Notes (Signed)
ANC  1.0 today.   OK to proceed with treatment as per Dr. Alvy Bimler. ?

## 2022-02-20 ENCOUNTER — Inpatient Hospital Stay: Payer: Commercial Managed Care - HMO

## 2022-02-20 ENCOUNTER — Inpatient Hospital Stay: Payer: Commercial Managed Care - HMO | Attending: Hematology | Admitting: Adult Health

## 2022-02-20 ENCOUNTER — Other Ambulatory Visit: Payer: Self-pay

## 2022-02-20 VITALS — BP 149/78 | HR 98 | Temp 97.7°F | Resp 18 | Ht 65.0 in | Wt 181.7 lb

## 2022-02-20 DIAGNOSIS — Z888 Allergy status to other drugs, medicaments and biological substances status: Secondary | ICD-10-CM | POA: Insufficient documentation

## 2022-02-20 DIAGNOSIS — Z170 Estrogen receptor positive status [ER+]: Secondary | ICD-10-CM

## 2022-02-20 DIAGNOSIS — K7689 Other specified diseases of liver: Secondary | ICD-10-CM | POA: Diagnosis not present

## 2022-02-20 DIAGNOSIS — R059 Cough, unspecified: Secondary | ICD-10-CM | POA: Diagnosis not present

## 2022-02-20 DIAGNOSIS — Z803 Family history of malignant neoplasm of breast: Secondary | ICD-10-CM | POA: Diagnosis not present

## 2022-02-20 DIAGNOSIS — Z808 Family history of malignant neoplasm of other organs or systems: Secondary | ICD-10-CM | POA: Insufficient documentation

## 2022-02-20 DIAGNOSIS — R0600 Dyspnea, unspecified: Secondary | ICD-10-CM | POA: Insufficient documentation

## 2022-02-20 DIAGNOSIS — Z800 Family history of malignant neoplasm of digestive organs: Secondary | ICD-10-CM | POA: Diagnosis not present

## 2022-02-20 DIAGNOSIS — F419 Anxiety disorder, unspecified: Secondary | ICD-10-CM | POA: Insufficient documentation

## 2022-02-20 DIAGNOSIS — C50212 Malignant neoplasm of upper-inner quadrant of left female breast: Secondary | ICD-10-CM | POA: Diagnosis present

## 2022-02-20 DIAGNOSIS — Z8051 Family history of malignant neoplasm of kidney: Secondary | ICD-10-CM | POA: Diagnosis not present

## 2022-02-20 DIAGNOSIS — I10 Essential (primary) hypertension: Secondary | ICD-10-CM | POA: Diagnosis not present

## 2022-02-20 DIAGNOSIS — Z8371 Family history of colonic polyps: Secondary | ICD-10-CM | POA: Insufficient documentation

## 2022-02-20 DIAGNOSIS — R5383 Other fatigue: Secondary | ICD-10-CM | POA: Diagnosis not present

## 2022-02-20 DIAGNOSIS — Z79899 Other long term (current) drug therapy: Secondary | ICD-10-CM | POA: Insufficient documentation

## 2022-02-20 DIAGNOSIS — Z882 Allergy status to sulfonamides status: Secondary | ICD-10-CM | POA: Insufficient documentation

## 2022-02-20 DIAGNOSIS — R0989 Other specified symptoms and signs involving the circulatory and respiratory systems: Secondary | ICD-10-CM | POA: Diagnosis not present

## 2022-02-20 DIAGNOSIS — D649 Anemia, unspecified: Secondary | ICD-10-CM | POA: Insufficient documentation

## 2022-02-20 DIAGNOSIS — R590 Localized enlarged lymph nodes: Secondary | ICD-10-CM | POA: Diagnosis not present

## 2022-02-20 DIAGNOSIS — Z5111 Encounter for antineoplastic chemotherapy: Secondary | ICD-10-CM | POA: Insufficient documentation

## 2022-02-20 DIAGNOSIS — Z95828 Presence of other vascular implants and grafts: Secondary | ICD-10-CM

## 2022-02-20 LAB — CBC WITH DIFFERENTIAL (CANCER CENTER ONLY)
Abs Immature Granulocytes: 0.04 10*3/uL (ref 0.00–0.07)
Basophils Absolute: 0 10*3/uL (ref 0.0–0.1)
Basophils Relative: 1 %
Eosinophils Absolute: 0.7 10*3/uL — ABNORMAL HIGH (ref 0.0–0.5)
Eosinophils Relative: 18 %
HCT: 25.6 % — ABNORMAL LOW (ref 36.0–46.0)
Hemoglobin: 8.4 g/dL — ABNORMAL LOW (ref 12.0–15.0)
Immature Granulocytes: 1 %
Lymphocytes Relative: 17 %
Lymphs Abs: 0.7 10*3/uL (ref 0.7–4.0)
MCH: 28.1 pg (ref 26.0–34.0)
MCHC: 32.8 g/dL (ref 30.0–36.0)
MCV: 85.6 fL (ref 80.0–100.0)
Monocytes Absolute: 0.2 10*3/uL (ref 0.1–1.0)
Monocytes Relative: 5 %
Neutro Abs: 2.5 10*3/uL (ref 1.7–7.7)
Neutrophils Relative %: 58 %
Platelet Count: 220 10*3/uL (ref 150–400)
RBC: 2.99 MIL/uL — ABNORMAL LOW (ref 3.87–5.11)
RDW: 18.8 % — ABNORMAL HIGH (ref 11.5–15.5)
WBC Count: 4.2 10*3/uL (ref 4.0–10.5)
nRBC: 0 % (ref 0.0–0.2)

## 2022-02-20 LAB — CMP (CANCER CENTER ONLY)
ALT: 29 U/L (ref 0–44)
AST: 18 U/L (ref 15–41)
Albumin: 3.9 g/dL (ref 3.5–5.0)
Alkaline Phosphatase: 91 U/L (ref 38–126)
Anion gap: 7 (ref 5–15)
BUN: 13 mg/dL (ref 8–23)
CO2: 24 mmol/L (ref 22–32)
Calcium: 8.9 mg/dL (ref 8.9–10.3)
Chloride: 110 mmol/L (ref 98–111)
Creatinine: 0.74 mg/dL (ref 0.44–1.00)
GFR, Estimated: >60 mL/min (ref >60)
Glucose, Bld: 146 mg/dL — ABNORMAL HIGH (ref 70–99)
Potassium: 3.5 mmol/L (ref 3.5–5.1)
Sodium: 141 mmol/L (ref 135–145)
Total Bilirubin: 0.5 mg/dL (ref 0.3–1.2)
Total Protein: 6.5 g/dL (ref 6.5–8.1)

## 2022-02-20 MED ORDER — DEXAMETHASONE SODIUM PHOSPHATE 10 MG/ML IJ SOLN
5.0000 mg | Freq: Once | INTRAMUSCULAR | Status: AC
  Administered 2022-02-20: 5 mg via INTRAVENOUS
  Filled 2022-02-20: qty 1

## 2022-02-20 MED ORDER — SODIUM CHLORIDE 0.9 % IV SOLN
80.0000 mg/m2 | Freq: Once | INTRAVENOUS | Status: AC
  Administered 2022-02-20: 156 mg via INTRAVENOUS
  Filled 2022-02-20: qty 26

## 2022-02-20 MED ORDER — HEPARIN SOD (PORK) LOCK FLUSH 100 UNIT/ML IV SOLN
500.0000 [IU] | Freq: Once | INTRAVENOUS | Status: AC | PRN
  Administered 2022-02-20: 500 [IU]

## 2022-02-20 MED ORDER — FAMOTIDINE IN NACL 20-0.9 MG/50ML-% IV SOLN
20.0000 mg | Freq: Once | INTRAVENOUS | Status: AC
  Administered 2022-02-20: 20 mg via INTRAVENOUS
  Filled 2022-02-20: qty 50

## 2022-02-20 MED ORDER — SODIUM CHLORIDE 0.9 % IV SOLN: Freq: Once | INTRAVENOUS | Status: AC

## 2022-02-20 MED ORDER — DIPHENHYDRAMINE HCL 50 MG/ML IJ SOLN
25.0000 mg | Freq: Once | INTRAMUSCULAR | Status: AC
  Administered 2022-02-20: 25 mg via INTRAVENOUS
  Filled 2022-02-20: qty 1

## 2022-02-20 MED ORDER — SODIUM CHLORIDE 0.9% FLUSH
10.0000 mL | INTRAVENOUS | Status: DC | PRN
  Administered 2022-02-20: 10 mL

## 2022-02-20 MED ORDER — SODIUM CHLORIDE 0.9% FLUSH
10.0000 mL | Freq: Once | INTRAVENOUS | Status: AC
  Administered 2022-02-20: 10 mL

## 2022-02-20 NOTE — Progress Notes (Signed)
[facility] Follow up: ?  ? ?Everardo Beals, NP ?[address] ?[place] [address] ? ?I connected with Dimas Millin on 02/20/22 at  8:15 AM EDT by video and verified that I am speaking with the correct person using two identifiers.  ?I discussed the limitations, risks, security and privacy concerns of performing an evaluation and management service virtually and the availability of in person appointments.  ?I also discussed with the patient that there may be a patient responsible charge related to this service. The patient expressed understanding and agreed to proceed.  ? ?Patient location: [facility] exam room 32 ?Provider location: At her residence in [place], [place] ? ?DIAGNOSIS:  Cancer Staging  ?Malignant neoplasm of upper-inner quadrant of left breast in female, estrogen receptor positive ([place]) ?Staging form: Breast, AJCC 8th Edition ?- Clinical stage from 10/27/2021: Stage IIB (cT1c, cN1, cM0, G3, ER+, PR-, HER2-) - Signed by [name], NP on 11/28/2021 ?Stage prefix: Initial diagnosis ?Histologic grading system: 3 grade system ? ? ?SUMMARY OF ONCOLOGIC HISTORY: ?Oncology History Overview Note  ? Cancer Staging  ?Malignant neoplasm of upper-inner quadrant of left breast in female, estrogen receptor positive ([place]) ?Staging form: Breast, AJCC 8th Edition ?- Clinical stage from 10/27/2021: Stage IIB (cT1c, cN1, cM0, G3, ER+, PR-, HER2-) - Signed by Truitt Merle, MD on 11/07/2021 ? ?  ?Malignant neoplasm of upper-inner quadrant of left breast in female, estrogen receptor positive ([place])  ?10/19/2021 Mammogram  ? CLINICAL DATA:  Patient presents for palpable left axillary ?abnormality. ?  ?EXAM: ?DIGITAL DIAGNOSTIC BILATERAL MAMMOGRAM WITH TOMOSYNTHESIS AND CAD; ?ULTRASOUND LEFT BREAST LIMITED ? ?IMPRESSION: ?Suspicious left breast mass 10 o'clock position. ?  ?Adjacent suspicious satellite nodule left breast 12 o'clock ?position. ?  ?Palpable mass left axilla corresponds with a large lymph node. ?   ?10/27/2021 Initial Biopsy  ? Diagnosis ?1. Breast, left, needle core biopsy, 12 o'clock, ribbon ?- FIBROADENOMA ?- NO MALIGNANCY IDENTIFIED ?2. Breast, left, needle core biopsy, 10 o'clock, coil ?- INVASIVE DUCTAL CARCINOMA WITH NECROSIS ?- DUCTAL CARCINOMA IN SITU ?- SEE COMMENT ?3. Lymph node, needle/core biopsy, left axilla, tribell ?- INVASIVE DUCTAL CARCINOMA WITH NECROSIS ?- NO NODAL TISSUE IDENTIFIED ?- SEE COMMENT ?Microscopic Comment ?2. and 2. Based on the biopsy, the carcinoma appears Nottingham grade 3 of 3 and measures 0.8 cm in greatest linear ?extent. ? ?3. PROGNOSTIC INDICATORS ?Results: ?The tumor cells are NEGATIVE for Her2 (1+). ?Estrogen Receptor: 30%, POSITIVE, WEAK STAINING INTENSITY ?Progesterone Receptor: 0%, NEGATIVE ?Proliferation Marker Ki67: 60% ?  ?10/27/2021 Cancer Staging  ? Staging form: Breast, AJCC 8th Edition ?- Clinical stage from 10/27/2021: Stage IIB (cT1c, cN1, cM0, G3, ER+, PR-, HER2-) - Signed by [name], NP on 11/28/2021 ?Stage prefix: Initial diagnosis ?Histologic grading system: 3 grade system ? ?  ?11/06/2021 Initial Diagnosis  ? Malignant neoplasm of upper-inner quadrant of left breast in female, estrogen receptor positive ([place]) ? ?  ?11/15/2021 Breast MRI  ? IMPRESSION: ?1.9 cm mass in the upper-inner quadrant of the left breast corresponding with the known invasive ductal carcinoma. Enlarged left axillary lymph node corresponding with known metastatic disease. ?  ?RECOMMENDATION: ?Treatment planning of the known left breast cancer and axillary metastasis is recommended. ?  ?Additional imaging evaluation of the mass in the liver is recommended with CT with liver mass protocol or MRI. ?  ?11/22/2021 Imaging  ? Bone scan IMPRESSION: ?No scintigraphic evidence of bony metastatic disease. Degenerative changes as above. ?  ?11/22/2021 Echocardiogram  ?  Baseline echo LVEF 55-60%, normal GLS -21.7% ?  ?11/23/2021 Imaging  ? CT CAP IMPRESSION: ?1. Bulky lymphadenopathy in the left  axilla including a 5.0 x 2.8 cm lymph node. ?2. 15 mm nodule identified in the upper inner quadrant of the left breast. ?3. No suspicious pulmonary nodule or mass. No evidence for metastatic disease in the abdomen or pelvis. ?4. 11 mm cyst in the dome of the left liver. Adjacent tiny hypoattenuating lesions in the right liver are too small to characterize, but likely benign. Attention on follow-up recommended. ?5. Prominent stool volume raises the question of clinical constipation. ?  ?11/28/2021 -  Chemotherapy  ? Patient is on Treatment Plan : BREAST ADJUVANT DOSE DENSE AC q14d / PACLitaxel q7d  ? ?  ?  ? Genetic Testing  ? [facility] CancerNext was Negative. Report date is 11/26/2021.  ? ?The CancerNext gene panel offered by [employer] includes sequencing, rearrangement analysis, and RNA analysis for the following 36 genes:   APC, ATM, AXIN2, BARD1, BMPR1A, BRCA1, BRCA2, BRIP1, CDH1, CDK4, CDKN2A, CHEK2, DICER1, HOXB13, EPCAM, GREM1, MLH1, MSH2, MSH3, MSH6, MUTYH, NBN, NF1, NTHL1, PALB2, PMS2, POLD1, POLE, PTEN, RAD51C, RAD51D, RECQL, SMAD4, SMARCA4, STK11, and TP53.  ?  ? ? ?CURRENT THERAPY: Taxol week 5 ? ?INTERVAL HISTORY: ?SAESHA LLERENAS 66 y.o. female returns for f/u of her weekly chemotherapy.  She is here prior to receiving Taxol weekly.  She is tolerating treatment well.  She denies any peripheral neuropathy, bowel/bladder changes, mucositis, nausea, vomiting.  She has mild fatigue.  She exercises with walking about 3 times a week when she isn't exhausted.  Otherwise she has no issues.   ? ? ?Patient Active Problem List  ? Diagnosis Date Noted  ? Anxiety 12/08/2021  ? Genetic testing 11/30/2021  ? Port-A-Cath in place 11/28/2021  ? Malignant neoplasm of upper-inner quadrant of left breast in female, estrogen receptor positive ([place]) 11/06/2021  ? Hypertension 03/15/2020  ? ? ?is allergic to prednisone, rosuvastatin, other, robitussin (alcohol free) [guaifenesin], and sulfa antibiotics. ? ?MEDICAL HISTORY: ?Past  Medical History:  ?Diagnosis Date  ? Anxiety   ? Arthritis   ? right knee   ? GERD (gastroesophageal reflux disease)   ? Hyperlipidemia   ? Hypertension   ? Positive colorectal cancer screening using Cologuard test   ? ? ?SURGICAL HISTORY: ?Past Surgical History:  ?Procedure Laterality Date  ? BREAST CYST EXCISION Right   ? pt stated in 1980  ? BREAST SURGERY    ? other GI testing    ? unsure but had to drink a chalky drink   ? PORTACATH PLACEMENT N/A 11/27/2021  ? Procedure: INSERTION PORT-A-CATH;  Surgeon: Erroll Luna, MD;  Location: [facility];  Service: General;  Laterality: N/A;  ? TONSILLECTOMY    ? TUBAL LIGATION    ? ? ?SOCIAL HISTORY: ?Social History  ? ?Socioeconomic History  ? Marital status: Divorced  ?  Spouse name: Not on file  ? Number of children: 2  ? Years of education: Not on file  ? Highest education level: Not on file  ?Occupational History  ? Not on file  ?Tobacco Use  ? Smoking status: Never  ? Smokeless tobacco: Never  ?Vaping Use  ? Vaping Use: Never used  ?Substance and Sexual Activity  ? Alcohol use: No  ? Drug use: No  ? Sexual activity: Not on file  ?Other Topics Concern  ? Not on file  ?Social History Narrative  ? Not on file  ? ?  Social Determinants of Health  ? ?Financial Resource Strain: Not on file  ?Food Insecurity: No Food Insecurity  ? Worried About Charity fundraiser in the Last Year: Never true  ? Ran Out of Food in the Last Year: Never true  ?Transportation Needs: No Transportation Needs  ? Lack of Transportation (Medical): No  ? Lack of Transportation (Non-Medical): No  ?Physical Activity: Not on file  ?Stress: Not on file  ?Social Connections: Not on file  ?Intimate Partner Violence: Not on file  ? ? ?FAMILY HISTORY: ?Family History  ?Problem Relation Age of Onset  ? Colon polyps Sister   ? Colon polyps Sister   ? Colon polyps Sister   ? Colon cancer Maternal Uncle   ?     dx. 53s  ? Throat cancer Maternal Uncle   ? Colon cancer Cousin   ?     dx. >50  ? Gastric cancer Cousin    ?     dx. >50  ? Breast cancer Cousin   ?     dx. 29s  ? Kidney cancer Nephew   ? Esophageal cancer Neg Hx   ? Rectal cancer Neg Hx   ? Stomach cancer Neg Hx   ? ? ?Review of Systems  ?[place]

## 2022-02-20 NOTE — Patient Instructions (Signed)
[facility]  Discharge Instructions: ?Thank you for choosing [facility] to provide your oncology and hematology care.  ? ?If you have a lab appointment with the [facility], please go directly to the [facility] and check in at the registration area. ?  ?Wear comfortable clothing and clothing appropriate for easy access to any Portacath or PICC line.  ? ?We strive to give you quality time with your provider. You may need to reschedule your appointment if you arrive late (15 or more minutes).  Arriving late affects you and other patients whose appointments are after yours.  Also, if you miss three or more appointments without notifying the office, you may be dismissed from the clinic at the provider?s discretion.    ?  ?For prescription refill requests, have your pharmacy contact our office and allow 72 hours for refills to be completed.   ? ?Today you received the following chemotherapy and/or immunotherapy agents :  Taxol.  ?  ?To help prevent nausea and vomiting after your treatment, we encourage you to take your nausea medication as directed. ? ?BELOW ARE SYMPTOMS THAT SHOULD BE REPORTED IMMEDIATELY: ?*FEVER GREATER THAN 100.4 F (38 ?C) OR HIGHER ?*CHILLS OR SWEATING ?*NAUSEA AND VOMITING THAT IS NOT CONTROLLED WITH YOUR NAUSEA MEDICATION ?*UNUSUAL SHORTNESS OF BREATH ?*UNUSUAL BRUISING OR BLEEDING ?*URINARY PROBLEMS (pain or burning when urinating, or frequent urination) ?*BOWEL PROBLEMS (unusual diarrhea, constipation, pain near the anus) ?TENDERNESS IN MOUTH AND THROAT WITH OR WITHOUT PRESENCE OF ULCERS (sore throat, sores in mouth, or a toothache) ?UNUSUAL RASH, SWELLING OR PAIN  ?UNUSUAL VAGINAL DISCHARGE OR ITCHING  ? ?Items with * indicate a potential emergency and should be followed up as soon as possible or go to the Emergency Department if any problems should occur. ? ?Please show the CHEMOTHERAPY ALERT CARD or IMMUNOTHERAPY ALERT CARD at check-in to the  Emergency Department and triage nurse. ? ?Should you have questions after your visit or need to cancel or reschedule your appointment, please contact [facility]  Dept: [phone]  and follow the prompts.  Office hours are 8:00 a.m. to 4:30 p.m. Monday - Friday. Please note that voicemails left after 4:00 p.m. may not be returned until the following business day.  We are closed weekends and major holidays. You have access to a nurse at all times for urgent questions. Please call the main number to the clinic Dept: [phone] and follow the prompts. ? ? ?For any non-urgent questions, you may also contact your provider using MyChart. We now offer e-Visits for anyone 71 and older to request care online for non-urgent symptoms. For details visit [email or web address]. ?  ?Also download the MyChart app! Go to the app store, search "MyChart", open the app, select [facility], and log in with your MyChart username and password. ? ?Due to Covid, a mask is required upon entering the hospital/clinic. If you do not have a mask, one will be given to you upon arrival. For doctor visits, patients may have 1 support person aged 91 or older with them. For treatment visits, patients cannot have anyone with them due to current Covid guidelines and our immunocompromised population.  ? ?

## 2022-02-20 NOTE — Assessment & Plan Note (Signed)
Cassandra Brennan is a 65-year-old woman with stage IIb ER weakly positive, PR negative, HER2 negative breast cancer on neoadjuvant chemotherapy, who recently completed 4 cycles of Adriamycin and Cytoxan and is here prior to receiving cycle 5 of weekly Taxol. ? ?[name] is feeling quite well today.  She is not experiencing any significant issues secondary to her neoadjuvant Taxol such as peripheral neuropathy.  She is mildly fatigued however has been able to maintain a decent activity level and water intake throughout her treatment.  She will proceed with treatment today. ? ?I reviewed her labs with her today.  Her white blood cells are stable, her hemoglobin is 8.4 and unchanged.  Her platelet count was normal.  We reviewed her blood sugar was slightly elevated at 146.  I updated her medication list and verified she is no longer taking dexamethasone for 3 to 5 days following chemotherapy. ? ?[name] will proceed with chemotherapy today considering all of the above.  We will see her weekly for her treatments and she will follow-up with a provider at least every other week.  She can no longer fill her breast cancer which is a good indication of clinical response to treatment. ?

## 2022-02-22 ENCOUNTER — Encounter: Payer: Self-pay | Admitting: Hematology

## 2022-02-25 ENCOUNTER — Encounter: Payer: Self-pay | Admitting: Hematology

## 2022-02-26 ENCOUNTER — Other Ambulatory Visit: Payer: Self-pay

## 2022-02-26 ENCOUNTER — Inpatient Hospital Stay: Payer: Commercial Managed Care - HMO

## 2022-02-26 VITALS — BP 145/81 | HR 91 | Temp 98.7°F | Resp 18 | Ht 65.0 in | Wt 178.5 lb

## 2022-02-26 DIAGNOSIS — Z5111 Encounter for antineoplastic chemotherapy: Secondary | ICD-10-CM | POA: Diagnosis not present

## 2022-02-26 DIAGNOSIS — C50212 Malignant neoplasm of upper-inner quadrant of left female breast: Secondary | ICD-10-CM

## 2022-02-26 DIAGNOSIS — Z95828 Presence of other vascular implants and grafts: Secondary | ICD-10-CM

## 2022-02-26 LAB — CBC WITH DIFFERENTIAL (CANCER CENTER ONLY)
Abs Immature Granulocytes: 0.02 10*3/uL (ref 0.00–0.07)
Basophils Absolute: 0.1 10*3/uL (ref 0.0–0.1)
Basophils Relative: 1 %
Eosinophils Absolute: 0.4 10*3/uL (ref 0.0–0.5)
Eosinophils Relative: 11 %
HCT: 26.4 % — ABNORMAL LOW (ref 36.0–46.0)
Hemoglobin: 9 g/dL — ABNORMAL LOW (ref 12.0–15.0)
Immature Granulocytes: 1 %
Lymphocytes Relative: 16 %
Lymphs Abs: 0.6 10*3/uL — ABNORMAL LOW (ref 0.7–4.0)
MCH: 28.9 pg (ref 26.0–34.0)
MCHC: 34.1 g/dL (ref 30.0–36.0)
MCV: 84.9 fL (ref 80.0–100.0)
Monocytes Absolute: 0.2 10*3/uL (ref 0.1–1.0)
Monocytes Relative: 4 %
Neutro Abs: 2.5 10*3/uL (ref 1.7–7.7)
Neutrophils Relative %: 67 %
Platelet Count: 200 10*3/uL (ref 150–400)
RBC: 3.11 MIL/uL — ABNORMAL LOW (ref 3.87–5.11)
RDW: 18.4 % — ABNORMAL HIGH (ref 11.5–15.5)
WBC Count: 3.7 10*3/uL — ABNORMAL LOW (ref 4.0–10.5)
nRBC: 0 % (ref 0.0–0.2)

## 2022-02-26 LAB — CMP (CANCER CENTER ONLY)
ALT: 43 U/L (ref 0–44)
AST: 24 U/L (ref 15–41)
Albumin: 4.1 g/dL (ref 3.5–5.0)
Alkaline Phosphatase: 91 U/L (ref 38–126)
Anion gap: 5 (ref 5–15)
BUN: 11 mg/dL (ref 8–23)
CO2: 26 mmol/L (ref 22–32)
Calcium: 9.2 mg/dL (ref 8.9–10.3)
Chloride: 108 mmol/L (ref 98–111)
Creatinine: 0.68 mg/dL (ref 0.44–1.00)
GFR, Estimated: >60 mL/min (ref >60)
Glucose, Bld: 112 mg/dL — ABNORMAL HIGH (ref 70–99)
Potassium: 3.9 mmol/L (ref 3.5–5.1)
Sodium: 139 mmol/L (ref 135–145)
Total Bilirubin: 0.7 mg/dL (ref 0.3–1.2)
Total Protein: 7.1 g/dL (ref 6.5–8.1)

## 2022-02-26 MED ORDER — SODIUM CHLORIDE 0.9% FLUSH
10.0000 mL | INTRAVENOUS | Status: DC | PRN
  Administered 2022-02-26: 10 mL

## 2022-02-26 MED ORDER — DEXAMETHASONE SODIUM PHOSPHATE 10 MG/ML IJ SOLN
5.0000 mg | Freq: Once | INTRAMUSCULAR | Status: AC
  Administered 2022-02-26: 5 mg via INTRAVENOUS
  Filled 2022-02-26: qty 1

## 2022-02-26 MED ORDER — FAMOTIDINE IN NACL 20-0.9 MG/50ML-% IV SOLN
20.0000 mg | Freq: Once | INTRAVENOUS | Status: AC
  Administered 2022-02-26: 20 mg via INTRAVENOUS
  Filled 2022-02-26: qty 50

## 2022-02-26 MED ORDER — HEPARIN SOD (PORK) LOCK FLUSH 100 UNIT/ML IV SOLN
500.0000 [IU] | Freq: Once | INTRAVENOUS | Status: AC | PRN
  Administered 2022-02-26: 500 [IU]

## 2022-02-26 MED ORDER — SODIUM CHLORIDE 0.9 % IV SOLN: Freq: Once | INTRAVENOUS | Status: AC

## 2022-02-26 MED ORDER — SODIUM CHLORIDE 0.9 % IV SOLN
80.0000 mg/m2 | Freq: Once | INTRAVENOUS | Status: AC
  Administered 2022-02-26: 156 mg via INTRAVENOUS
  Filled 2022-02-26: qty 26

## 2022-02-26 MED ORDER — SODIUM CHLORIDE 0.9% FLUSH
10.0000 mL | Freq: Once | INTRAVENOUS | Status: AC
  Administered 2022-02-26: 10 mL

## 2022-02-26 MED ORDER — DIPHENHYDRAMINE HCL 50 MG/ML IJ SOLN
25.0000 mg | Freq: Once | INTRAMUSCULAR | Status: AC
  Administered 2022-02-26: 25 mg via INTRAVENOUS
  Filled 2022-02-26: qty 1

## 2022-02-26 NOTE — Patient Instructions (Signed)
[facility]   ?Discharge Instructions: ?Thank you for choosing [facility] to provide your oncology and hematology care.  ? ?If you have a lab appointment with the [facility], please go directly to the [facility] and check in at the registration area. ?  ?Wear comfortable clothing and clothing appropriate for easy access to any Portacath or PICC line.  ? ?We strive to give you quality time with your provider. You may need to reschedule your appointment if you arrive late (15 or more minutes).  Arriving late affects you and other patients whose appointments are after yours.  Also, if you miss three or more appointments without notifying the office, you may be dismissed from the clinic at the provider?s discretion.    ?  ?For prescription refill requests, have your pharmacy contact our office and allow 72 hours for refills to be completed.   ? ?Today you received the following chemotherapy and/or immunotherapy agents: Paclitaxel (Taxol)    ?  ?To help prevent nausea and vomiting after your treatment, we encourage you to take your nausea medication as directed. ? ?BELOW ARE SYMPTOMS THAT SHOULD BE REPORTED IMMEDIATELY: ?*FEVER GREATER THAN 100.4 F (38 ?C) OR HIGHER ?*CHILLS OR SWEATING ?*NAUSEA AND VOMITING THAT IS NOT CONTROLLED WITH YOUR NAUSEA MEDICATION ?*UNUSUAL SHORTNESS OF BREATH ?*UNUSUAL BRUISING OR BLEEDING ?*URINARY PROBLEMS (pain or burning when urinating, or frequent urination) ?*BOWEL PROBLEMS (unusual diarrhea, constipation, pain near the anus) ?TENDERNESS IN MOUTH AND THROAT WITH OR WITHOUT PRESENCE OF ULCERS (sore throat, sores in mouth, or a toothache) ?UNUSUAL RASH, SWELLING OR PAIN  ?UNUSUAL VAGINAL DISCHARGE OR ITCHING  ? ?Items with * indicate a potential emergency and should be followed up as soon as possible or go to the Emergency Department if any problems should occur. ? ?Please show the CHEMOTHERAPY ALERT CARD or IMMUNOTHERAPY ALERT CARD at  check-in to the Emergency Department and triage nurse. ? ?Should you have questions after your visit or need to cancel or reschedule your appointment, please contact [facility]  Dept: [phone]  and follow the prompts.  Office hours are 8:00 a.m. to 4:30 p.m. Monday - Friday. Please note that voicemails left after 4:00 p.m. may not be returned until the following business day.  We are closed weekends and major holidays. You have access to a nurse at all times for urgent questions. Please call the main number to the clinic Dept: [phone] and follow the prompts. ? ? ?For any non-urgent questions, you may also contact your provider using MyChart. We now offer e-Visits for anyone 73 and older to request care online for non-urgent symptoms. For details visit [email or web address]. ?  ?Also download the MyChart app! Go to the app store, search "MyChart", open the app, select [facility], and log in with your MyChart username and password. ? ?Due to Covid, a mask is required upon entering the hospital/clinic. If you do not have a mask, one will be given to you upon arrival. For doctor visits, patients may have 1 support person aged 37 or older with them. For treatment visits, patients cannot have anyone with them due to current Covid guidelines and our immunocompromised population.  ? ?

## 2022-03-05 ENCOUNTER — Inpatient Hospital Stay: Payer: Commercial Managed Care - HMO | Admitting: Hematology

## 2022-03-05 ENCOUNTER — Other Ambulatory Visit: Payer: Self-pay

## 2022-03-05 ENCOUNTER — Inpatient Hospital Stay: Payer: Commercial Managed Care - HMO

## 2022-03-05 ENCOUNTER — Encounter: Payer: Self-pay | Admitting: Hematology

## 2022-03-05 VITALS — BP 152/84 | HR 97 | Temp 98.5°F | Resp 18 | Ht 65.0 in | Wt 180.6 lb

## 2022-03-05 DIAGNOSIS — Z170 Estrogen receptor positive status [ER+]: Secondary | ICD-10-CM

## 2022-03-05 DIAGNOSIS — Z5111 Encounter for antineoplastic chemotherapy: Secondary | ICD-10-CM | POA: Diagnosis not present

## 2022-03-05 DIAGNOSIS — Z95828 Presence of other vascular implants and grafts: Secondary | ICD-10-CM

## 2022-03-05 DIAGNOSIS — C50212 Malignant neoplasm of upper-inner quadrant of left female breast: Secondary | ICD-10-CM

## 2022-03-05 LAB — CBC WITH DIFFERENTIAL (CANCER CENTER ONLY)
Abs Immature Granulocytes: 0.05 10*3/uL (ref 0.00–0.07)
Basophils Absolute: 0 10*3/uL (ref 0.0–0.1)
Basophils Relative: 1 %
Eosinophils Absolute: 0.3 10*3/uL (ref 0.0–0.5)
Eosinophils Relative: 7 %
HCT: 24.5 % — ABNORMAL LOW (ref 36.0–46.0)
Hemoglobin: 8.3 g/dL — ABNORMAL LOW (ref 12.0–15.0)
Immature Granulocytes: 1 %
Lymphocytes Relative: 14 %
Lymphs Abs: 0.6 10*3/uL — ABNORMAL LOW (ref 0.7–4.0)
MCH: 29.1 pg (ref 26.0–34.0)
MCHC: 33.9 g/dL (ref 30.0–36.0)
MCV: 86 fL (ref 80.0–100.0)
Monocytes Absolute: 0.2 10*3/uL (ref 0.1–1.0)
Monocytes Relative: 5 %
Neutro Abs: 2.9 10*3/uL (ref 1.7–7.7)
Neutrophils Relative %: 72 %
Platelet Count: 228 10*3/uL (ref 150–400)
RBC: 2.85 MIL/uL — ABNORMAL LOW (ref 3.87–5.11)
RDW: 19.1 % — ABNORMAL HIGH (ref 11.5–15.5)
WBC Count: 4.1 10*3/uL (ref 4.0–10.5)
nRBC: 0.5 % — ABNORMAL HIGH (ref 0.0–0.2)

## 2022-03-05 LAB — CMP (CANCER CENTER ONLY)
ALT: 25 U/L (ref 0–44)
AST: 17 U/L (ref 15–41)
Albumin: 4 g/dL (ref 3.5–5.0)
Alkaline Phosphatase: 78 U/L (ref 38–126)
Anion gap: 8 (ref 5–15)
BUN: 14 mg/dL (ref 8–23)
CO2: 24 mmol/L (ref 22–32)
Calcium: 9 mg/dL (ref 8.9–10.3)
Chloride: 108 mmol/L (ref 98–111)
Creatinine: 0.71 mg/dL (ref 0.44–1.00)
GFR, Estimated: >60 mL/min (ref >60)
Glucose, Bld: 120 mg/dL — ABNORMAL HIGH (ref 70–99)
Potassium: 3.5 mmol/L (ref 3.5–5.1)
Sodium: 140 mmol/L (ref 135–145)
Total Bilirubin: 0.6 mg/dL (ref 0.3–1.2)
Total Protein: 6.6 g/dL (ref 6.5–8.1)

## 2022-03-05 MED ORDER — DEXAMETHASONE SODIUM PHOSPHATE 10 MG/ML IJ SOLN
5.0000 mg | Freq: Once | INTRAMUSCULAR | Status: AC
  Administered 2022-03-05: 5 mg via INTRAVENOUS
  Filled 2022-03-05: qty 1

## 2022-03-05 MED ORDER — SODIUM CHLORIDE 0.9% FLUSH
10.0000 mL | Freq: Once | INTRAVENOUS | Status: AC
  Administered 2022-03-05: 10 mL

## 2022-03-05 MED ORDER — DIPHENHYDRAMINE HCL 50 MG/ML IJ SOLN
25.0000 mg | Freq: Once | INTRAMUSCULAR | Status: AC
  Administered 2022-03-05: 25 mg via INTRAVENOUS
  Filled 2022-03-05: qty 1

## 2022-03-05 MED ORDER — FAMOTIDINE IN NACL 20-0.9 MG/50ML-% IV SOLN
20.0000 mg | Freq: Once | INTRAVENOUS | Status: AC
  Administered 2022-03-05: 20 mg via INTRAVENOUS
  Filled 2022-03-05: qty 50

## 2022-03-05 MED ORDER — SODIUM CHLORIDE 0.9 % IV SOLN
80.0000 mg/m2 | Freq: Once | INTRAVENOUS | Status: AC
  Administered 2022-03-05: 156 mg via INTRAVENOUS
  Filled 2022-03-05: qty 26

## 2022-03-05 MED ORDER — SODIUM CHLORIDE 0.9% FLUSH
10.0000 mL | INTRAVENOUS | Status: DC | PRN
  Administered 2022-03-05: 10 mL

## 2022-03-05 MED ORDER — SODIUM CHLORIDE 0.9 % IV SOLN: Freq: Once | INTRAVENOUS | Status: AC

## 2022-03-05 MED ORDER — HEPARIN SOD (PORK) LOCK FLUSH 100 UNIT/ML IV SOLN
500.0000 [IU] | Freq: Once | INTRAVENOUS | Status: AC | PRN
  Administered 2022-03-05: 500 [IU]

## 2022-03-05 NOTE — Patient Instructions (Signed)
[facility]  Discharge Instructions: ?Thank you for choosing [facility] to provide your oncology and hematology care.  ? ?If you have a lab appointment with the [facility], please go directly to the [facility] and check in at the registration area. ?  ?Wear comfortable clothing and clothing appropriate for easy access to any Portacath or PICC line.  ? ?We strive to give you quality time with your provider. You may need to reschedule your appointment if you arrive late (15 or more minutes).  Arriving late affects you and other patients whose appointments are after yours.  Also, if you miss three or more appointments without notifying the office, you may be dismissed from the clinic at the provider?s discretion.    ?  ?For prescription refill requests, have your pharmacy contact our office and allow 72 hours for refills to be completed.   ? ?Today you received the following chemotherapy and/or immunotherapy agents: Paclitaxel.     ?  ?To help prevent nausea and vomiting after your treatment, we encourage you to take your nausea medication as directed. ? ?BELOW ARE SYMPTOMS THAT SHOULD BE REPORTED IMMEDIATELY: ?*FEVER GREATER THAN 100.4 F (38 ?C) OR HIGHER ?*CHILLS OR SWEATING ?*NAUSEA AND VOMITING THAT IS NOT CONTROLLED WITH YOUR NAUSEA MEDICATION ?*UNUSUAL SHORTNESS OF BREATH ?*UNUSUAL BRUISING OR BLEEDING ?*URINARY PROBLEMS (pain or burning when urinating, or frequent urination) ?*BOWEL PROBLEMS (unusual diarrhea, constipation, pain near the anus) ?TENDERNESS IN MOUTH AND THROAT WITH OR WITHOUT PRESENCE OF ULCERS (sore throat, sores in mouth, or a toothache) ?UNUSUAL RASH, SWELLING OR PAIN  ?UNUSUAL VAGINAL DISCHARGE OR ITCHING  ? ?Items with * indicate a potential emergency and should be followed up as soon as possible or go to the Emergency Department if any problems should occur. ? ?Please show the CHEMOTHERAPY ALERT CARD or IMMUNOTHERAPY ALERT CARD at check-in  to the Emergency Department and triage nurse. ? ?Should you have questions after your visit or need to cancel or reschedule your appointment, please contact [facility]  Dept: [phone]  and follow the prompts.  Office hours are 8:00 a.m. to 4:30 p.m. Monday - Friday. Please note that voicemails left after 4:00 p.m. may not be returned until the following business day.  We are closed weekends and major holidays. You have access to a nurse at all times for urgent questions. Please call the main number to the clinic Dept: [phone] and follow the prompts. ? ? ?For any non-urgent questions, you may also contact your provider using MyChart. We now offer e-Visits for anyone 65 and older to request care online for non-urgent symptoms. For details visit [email or web address]. ?  ?Also download the MyChart app! Go to the app store, search "MyChart", open the app, select [facility], and log in with your MyChart username and password. ? ?Due to Covid, a mask is required upon entering the hospital/clinic. If you do not have a mask, one will be given to you upon arrival. For doctor visits, patients may have 1 support person aged 65 or older with them. For treatment visits, patients cannot have anyone with them due to current Covid guidelines and our immunocompromised population.  ? ?

## 2022-03-05 NOTE — Progress Notes (Signed)
?[facility]   ?[phone] [phone]   ?Clinic Follow up Note  ? ?Patient Care Team: ?Everardo Beals, NP as PCP - General ?Erroll Luna, MD as Consulting Physician (General Surgery) ?Truitt Merle, MD as Consulting Physician (Hematology) ?Gery Pray, MD as Consulting Physician (Radiation Oncology) ?[name], RN as Oncology Nurse Navigator ?[name], RN as Oncology Nurse Navigator ? ?Date of Service:  03/05/2022 ? ?CHIEF COMPLAINT: f/u of left breast cancer ? ?CURRENT THERAPY:  ?Neoadjuvant Taxol, weekly, starting 01/22/22 ? ?ASSESSMENT & PLAN:  ?Cassandra Brennan is a 66 y.o. post-menopausal female with  ? ?1. 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 B/l diagnostic MM and left [place] 10/19/21 showed: 2 cm mass at 10 o'clock; 5 mm indeterminate satellite mass at 12 o'clock; palpable left axilla mass corresponds with large lymph node. Biopsy 10/27/21 confirmed invasive ductal carcinoma with necrosis to both 10 o'clock and lymph node, grade 3. ER weakly positive (30%). ?-breast MRI 11/15/21 shows the biopsy proven malignancy in the upper inner left breat measuring 1.9 cm, a biopsy proven malignant 4.3 cm left axillary LN and 2 other abnormal LNs measuring 1.3 and 1.5 cm, and a 1.3 liver mass likely representing a cyst.  ?-CT CAP 11/23/21 shows known left breast cancer and bulky axillary adenopathy, a liver cyst, and tiny indeterminate liver nodules too small to characterize. We will monitor these in the future. No evidence of distant metastasis.  ?-Bone scan is negative for osseous metastasis.  ?-Baseline echo on 11/22/21 is normal, LVEF 55-60%. ?-Left breast mass in the upper inner quadrant measured 2 cm and 2 palpable lymph nodes largest measuring 4.5 cm on 2/7 the day she began neoadjuvant chemo. ?-She received 4 cycles of neoadjuvant AC 09/27/21  - 01/08/22, tolerated moderate well with fatigue and nausea. Breast mass and adenopathy are no longer palpable. ?-she moved to weekly taxol on 01/22/22. She is tolerating much better than AC. ?-Lab reviewed, adequate for treatment, will proceed week 7 Taxol today ?-She has had excellent clinical response, her breast mass and axillary adenopathy are not palpable anymore.  We will plan to repeat MRI at the end of neoadjuvant chemo ?  ?2. Anemia ?-secondary to chemo, s/p blood transfusion 01/18/22 ?-hgb stable at 8.3 today (03/05/22) ?-will monitor. We discussed she may need another blood transfusion before the end of treatment. ?  ?3. Hypertension and anxiety ?-Continue medications and f/u with PCP ?  ?  ?PLAN: ?-proceed with week 7 taxol today and continue weekly ?-f/u every other week as scheduled ? ? ?No problem-specific Assessment & Plan notes found for this encounter. ? ? ?SUMMARY OF ONCOLOGIC HISTORY: ?Oncology History Overview Note  ? Cancer Staging  ?Malignant neoplasm of upper-inner quadrant of left breast in female, estrogen receptor positive ([place]) ?Staging form: Breast, AJCC 8th Edition ?- Clinical stage from 10/27/2021: Stage IIB (cT1c, cN1, cM0, G3, ER+, PR-, HER2-) - Signed by Truitt Merle, MD on 11/07/2021 ? ?  ?Malignant neoplasm of upper-inner quadrant of left breast in female, estrogen receptor positive ([place])  ?10/19/2021 Mammogram  ? CLINICAL DATA:  Patient presents for palpable left axillary ?abnormality. ?  ?EXAM: ?DIGITAL DIAGNOSTIC BILATERAL MAMMOGRAM WITH TOMOSYNTHESIS AND CAD; ?ULTRASOUND LEFT BREAST LIMITED ? ?IMPRESSION: ?Suspicious left breast mass 10 o'clock position. ?  ?Adjacent suspicious satellite nodule left breast 12 o'clock ?position. ?  ?Palpable mass left axilla corresponds  with a large lymph node. ?  ?10/27/2021 Initial Biopsy  ? Diagnosis ?1. Breast, left, needle core biopsy, 12 o'clock, ribbon ?- FIBROADENOMA ?- NO MALIGNANCY IDENTIFIED ?2. Breast, left, needle core biopsy, 10 o'clock,  coil ?- INVASIVE DUCTAL CARCINOMA WITH NECROSIS ?- DUCTAL CARCINOMA IN SITU ?- SEE COMMENT ?3. Lymph node, needle/core biopsy, left axilla, tribell ?- INVASIVE DUCTAL CARCINOMA WITH NECROSIS ?- NO NODAL TISSUE IDENTIFIED ?- SEE COMMENT ?Microscopic Comment ?2. and 2. Based on the biopsy, the carcinoma appears Nottingham grade 3 of 3 and measures 0.8 cm in greatest linear ?extent. ? ?3. PROGNOSTIC INDICATORS ?Results: ?The tumor cells are NEGATIVE for Her2 (1+). ?Estrogen Receptor: 30%, POSITIVE, WEAK STAINING INTENSITY ?Progesterone Receptor: 0%, NEGATIVE ?Proliferation Marker Ki67: 60% ?  ?10/27/2021 Cancer Staging  ? Staging form: Breast, AJCC 8th Edition ?- Clinical stage from 10/27/2021: Stage IIB (cT1c, cN1, cM0, G3, ER+, PR-, HER2-) - Signed by [name], NP on 11/28/2021 ?Stage prefix: Initial diagnosis ?Histologic grading system: 3 grade system ? ?  ?11/06/2021 Initial Diagnosis  ? Malignant neoplasm of upper-inner quadrant of left breast in female, estrogen receptor positive ([place]) ? ?  ?11/15/2021 Breast MRI  ? IMPRESSION: ?1.9 cm mass in the upper-inner quadrant of the left breast corresponding with the known invasive ductal carcinoma. Enlarged left axillary lymph node corresponding with known metastatic disease. ?  ?RECOMMENDATION: ?Treatment planning of the known left breast cancer and axillary metastasis is recommended. ?  ?Additional imaging evaluation of the mass in the liver is recommended with CT with liver mass protocol or MRI. ?  ?11/22/2021 Imaging  ? Bone scan IMPRESSION: ?No scintigraphic evidence of bony metastatic disease. Degenerative changes as above. ?  ?11/22/2021 Echocardiogram  ? Baseline echo LVEF 55-60%, normal GLS -21.7% ?  ?11/23/2021 Imaging  ? CT CAP IMPRESSION: ?1. Bulky lymphadenopathy in the left axilla including a 5.0 x 2.8 cm lymph node. ?2. 15 mm nodule identified in the upper inner quadrant of the left breast. ?3. No suspicious pulmonary nodule or mass. No evidence for metastatic  disease in the abdomen or pelvis. ?4. 11 mm cyst in the dome of the left liver. Adjacent tiny hypoattenuating lesions in the right liver are too small to characterize, but likely benign. Attention on follow-up recommended. ?5. Prominent stool volume raises the question of clinical constipation. ?  ?11/28/2021 -  Chemotherapy  ? Patient is on Treatment Plan : BREAST ADJUVANT DOSE DENSE AC q14d / PACLitaxel q7d  ? ?   ? Genetic Testing  ? [facility] CancerNext was Negative. Report date is 11/26/2021.  ? ?The CancerNext gene panel offered by [employer] includes sequencing, rearrangement analysis, and RNA analysis for the following 36 genes:   APC, ATM, AXIN2, BARD1, BMPR1A, BRCA1, BRCA2, BRIP1, CDH1, CDK4, CDKN2A, CHEK2, DICER1, HOXB13, EPCAM, GREM1, MLH1, MSH2, MSH3, MSH6, MUTYH, NBN, NF1, NTHL1, PALB2, PMS2, POLD1, POLE, PTEN, RAD51C, RAD51D, RECQL, SMAD4, SMARCA4, STK11, and TP53.  ?  ? ? ? ?INTERVAL HISTORY:  ?Cassandra Brennan is here for a follow up of breast cancer. She was last seen by NP Mendel Ryder on 02/20/22. She presents to the clinic alone. ?She reports she is tolerating treatment well overall. She notes her energy has improved, and she was able to do more over the weekend. She denies any neuropathy. ?  ?All other systems were reviewed with the patient and are negative. ? ?MEDICAL HISTORY:  ?Past Medical History:  ?Diagnosis Date  ? Anxiety   ? Arthritis   ? right knee   ?  GERD (gastroesophageal reflux disease)   ? Hyperlipidemia   ? Hypertension   ? Positive colorectal cancer screening using Cologuard test   ? ? ?SURGICAL HISTORY: ?Past Surgical History:  ?Procedure Laterality Date  ? BREAST CYST EXCISION Right   ? pt stated in 1980  ? BREAST SURGERY    ? other GI testing    ? unsure but had to drink a chalky drink   ? PORTACATH PLACEMENT N/A 11/27/2021  ? Procedure: INSERTION PORT-A-CATH;  Surgeon: Erroll Luna, MD;  Location: [facility];  Service: General;  Laterality: N/A;  ? TONSILLECTOMY    ? TUBAL LIGATION     ? ? ?I have reviewed the social history and family history with the patient and they are unchanged from previous note. ? ?ALLERGIES:  is allergic to prednisone, rosuvastatin, other, robitussin (alcohol free) [g

## 2022-03-12 ENCOUNTER — Other Ambulatory Visit: Payer: Self-pay

## 2022-03-12 ENCOUNTER — Inpatient Hospital Stay: Payer: Commercial Managed Care - HMO

## 2022-03-12 VITALS — BP 135/83 | HR 97 | Temp 98.6°F | Resp 28 | Wt 179.8 lb

## 2022-03-12 DIAGNOSIS — Z5111 Encounter for antineoplastic chemotherapy: Secondary | ICD-10-CM | POA: Diagnosis not present

## 2022-03-12 DIAGNOSIS — Z95828 Presence of other vascular implants and grafts: Secondary | ICD-10-CM

## 2022-03-12 DIAGNOSIS — C50212 Malignant neoplasm of upper-inner quadrant of left female breast: Secondary | ICD-10-CM

## 2022-03-12 LAB — CMP (CANCER CENTER ONLY)
ALT: 25 U/L (ref 0–44)
AST: 19 U/L (ref 15–41)
Albumin: 4.1 g/dL (ref 3.5–5.0)
Alkaline Phosphatase: 89 U/L (ref 38–126)
Anion gap: 7 (ref 5–15)
BUN: 11 mg/dL (ref 8–23)
CO2: 25 mmol/L (ref 22–32)
Calcium: 8.7 mg/dL — ABNORMAL LOW (ref 8.9–10.3)
Chloride: 106 mmol/L (ref 98–111)
Creatinine: 0.75 mg/dL (ref 0.44–1.00)
GFR, Estimated: >60 mL/min (ref >60)
Glucose, Bld: 126 mg/dL — ABNORMAL HIGH (ref 70–99)
Potassium: 3.4 mmol/L — ABNORMAL LOW (ref 3.5–5.1)
Sodium: 138 mmol/L (ref 135–145)
Total Bilirubin: 0.6 mg/dL (ref 0.3–1.2)
Total Protein: 7 g/dL (ref 6.5–8.1)

## 2022-03-12 LAB — CBC WITH DIFFERENTIAL (CANCER CENTER ONLY)
Abs Immature Granulocytes: 0.03 10*3/uL (ref 0.00–0.07)
Basophils Absolute: 0 10*3/uL (ref 0.0–0.1)
Basophils Relative: 1 %
Eosinophils Absolute: 0.1 10*3/uL (ref 0.0–0.5)
Eosinophils Relative: 3 %
HCT: 23.8 % — ABNORMAL LOW (ref 36.0–46.0)
Hemoglobin: 8.1 g/dL — ABNORMAL LOW (ref 12.0–15.0)
Immature Granulocytes: 1 %
Lymphocytes Relative: 15 %
Lymphs Abs: 0.7 10*3/uL (ref 0.7–4.0)
MCH: 29.1 pg (ref 26.0–34.0)
MCHC: 34 g/dL (ref 30.0–36.0)
MCV: 85.6 fL (ref 80.0–100.0)
Monocytes Absolute: 0.3 10*3/uL (ref 0.1–1.0)
Monocytes Relative: 6 %
Neutro Abs: 3.3 10*3/uL (ref 1.7–7.7)
Neutrophils Relative %: 74 %
Platelet Count: 226 10*3/uL (ref 150–400)
RBC: 2.78 MIL/uL — ABNORMAL LOW (ref 3.87–5.11)
RDW: 18.6 % — ABNORMAL HIGH (ref 11.5–15.5)
WBC Count: 4.4 10*3/uL (ref 4.0–10.5)
nRBC: 0.5 % — ABNORMAL HIGH (ref 0.0–0.2)

## 2022-03-12 LAB — PREPARE RBC (CROSSMATCH)

## 2022-03-12 MED ORDER — FAMOTIDINE IN NACL 20-0.9 MG/50ML-% IV SOLN
20.0000 mg | Freq: Once | INTRAVENOUS | Status: AC
  Administered 2022-03-12: 20 mg via INTRAVENOUS
  Filled 2022-03-12: qty 50

## 2022-03-12 MED ORDER — HEPARIN SOD (PORK) LOCK FLUSH 100 UNIT/ML IV SOLN
500.0000 [IU] | Freq: Once | INTRAVENOUS | Status: AC | PRN
  Administered 2022-03-12: 500 [IU]

## 2022-03-12 MED ORDER — SODIUM CHLORIDE 0.9% IV SOLUTION
250.0000 mL | Freq: Once | INTRAVENOUS | Status: AC
  Administered 2022-03-12: 250 mL via INTRAVENOUS

## 2022-03-12 MED ORDER — DEXAMETHASONE SODIUM PHOSPHATE 10 MG/ML IJ SOLN
5.0000 mg | Freq: Once | INTRAMUSCULAR | Status: AC
  Administered 2022-03-12: 5 mg via INTRAVENOUS
  Filled 2022-03-12: qty 1

## 2022-03-12 MED ORDER — DIPHENHYDRAMINE HCL 50 MG/ML IJ SOLN
25.0000 mg | Freq: Once | INTRAMUSCULAR | Status: AC
  Administered 2022-03-12: 25 mg via INTRAVENOUS
  Filled 2022-03-12: qty 1

## 2022-03-12 MED ORDER — SODIUM CHLORIDE 0.9 % IV SOLN: Freq: Once | INTRAVENOUS | Status: AC

## 2022-03-12 MED ORDER — SODIUM CHLORIDE 0.9 % IV SOLN
80.0000 mg/m2 | Freq: Once | INTRAVENOUS | Status: AC
  Administered 2022-03-12: 156 mg via INTRAVENOUS
  Filled 2022-03-12: qty 26

## 2022-03-12 MED ORDER — SODIUM CHLORIDE 0.9% FLUSH
10.0000 mL | INTRAVENOUS | Status: DC | PRN
  Administered 2022-03-12: 10 mL

## 2022-03-12 MED ORDER — SODIUM CHLORIDE 0.9% FLUSH
10.0000 mL | Freq: Once | INTRAVENOUS | Status: AC
  Administered 2022-03-12: 10 mL

## 2022-03-12 NOTE — Progress Notes (Signed)
Pt expressed an increase in fatigue that is affecting her ADLs and an increase in SOB with walking. Pt's Hgb 8.1.  RN informed MD of Pt's concerns.  MD ordered 1 unit of PRBCs to be given today in inf.  OK per Charge RN to add to tx. Pt understandable and agreed to receive tx and blood transfusion today.

## 2022-03-12 NOTE — Patient Instructions (Addendum)
[facility]  Discharge Instructions: Thank you for choosing [facility] to provide your oncology and hematology care.   If you have a lab appointment with the [facility], please go directly to the [facility] and check in at the registration area.   Wear comfortable clothing and clothing appropriate for easy access to any Portacath or PICC line.   We strive to give you quality time with your provider. You may need to reschedule your appointment if you arrive late (15 or more minutes).  Arriving late affects you and other patients whose appointments are after yours.  Also, if you miss three or more appointments without notifying the office, you may be dismissed from the clinic at the provider's discretion.      For prescription refill requests, have your pharmacy contact our office and allow 72 hours for refills to be completed.    Today you received the following chemotherapy and/or immunotherapy agents: Taxol   To help prevent nausea and vomiting after your treatment, we encourage you to take your nausea medication as directed.  BELOW ARE SYMPTOMS THAT SHOULD BE REPORTED IMMEDIATELY: *FEVER GREATER THAN 100.4 F (38 C) OR HIGHER *CHILLS OR SWEATING *NAUSEA AND VOMITING THAT IS NOT CONTROLLED WITH YOUR NAUSEA MEDICATION *UNUSUAL SHORTNESS OF BREATH *UNUSUAL BRUISING OR BLEEDING *URINARY PROBLEMS (pain or burning when urinating, or frequent urination) *BOWEL PROBLEMS (unusual diarrhea, constipation, pain near the anus) TENDERNESS IN MOUTH AND THROAT WITH OR WITHOUT PRESENCE OF ULCERS (sore throat, sores in mouth, or a toothache) UNUSUAL RASH, SWELLING OR PAIN  UNUSUAL VAGINAL DISCHARGE OR ITCHING   Items with * indicate a potential emergency and should be followed up as soon as possible or go to the Emergency Department if any problems should occur.  Please show the CHEMOTHERAPY ALERT CARD or IMMUNOTHERAPY ALERT CARD at check-in to the  Emergency Department and triage nurse.  Should you have questions after your visit or need to cancel or reschedule your appointment, please contact [facility]  Dept: [phone]  and follow the prompts.  Office hours are 8:00 a.m. to 4:30 p.m. Monday - Friday. Please note that voicemails left after 4:00 p.m. may not be returned until the following business day.  We are closed weekends and major holidays. You have access to a nurse at all times for urgent questions. Please call the main number to the clinic Dept: [phone] and follow the prompts.   For any non-urgent questions, you may also contact your provider using MyChart. We now offer e-Visits for anyone 72 and older to request care online for non-urgent symptoms. For details visit [email or web address].   Also download the MyChart app! Go to the app store, search "MyChart", open the app, select , and log in with your MyChart username and password.  Due to Covid, a mask is required upon entering the hospital/clinic. If you do not have a mask, one will be given to you upon arrival. For doctor visits, patients may have 1 support person aged 21 or older with them. For treatment visits, patients cannot have anyone with them due to current Covid guidelines and our immunocompromised population.   Blood Transfusion, Adult, Care After This sheet gives you information about how to care for yourself after your procedure. Your doctor may also give you more specific instructions. If you have problems or questions, contact your doctor. What can I expect after the procedure? After the procedure, it is common to have:  Bruising and soreness at the IV site. A headache. Follow these instructions at home: Insertion site care     Follow instructions from your doctor about how to take care of your insertion site. This is where an IV tube was put into your vein. Make sure you: Wash your hands with soap and water  before and after you change your bandage (dressing). If you cannot use soap and water, use hand sanitizer. Change your bandage as told by your doctor. Check your insertion site every day for signs of infection. Check for: Redness, swelling, or pain. Bleeding from the site. Warmth. Pus or a bad smell. General instructions Take over-the-counter and prescription medicines only as told by your doctor. Rest as told by your doctor. Go back to your normal activities as told by your doctor. Keep all follow-up visits as told by your doctor. This is important. Contact a doctor if: You have itching or red, swollen areas of skin (hives). You feel worried or nervous (anxious). You feel weak after doing your normal activities. You have redness, swelling, warmth, or pain around the insertion site. You have blood coming from the insertion site, and the blood does not stop with pressure. You have pus or a bad smell coming from the insertion site. Get help right away if: You have signs of a serious reaction. This may be coming from an allergy or the body's defense system (immune system). Signs include: Trouble breathing or shortness of breath. Swelling of the face or feeling warm (flushed). Fever or chills. Head, chest, or back pain. Dark pee (urine) or blood in the pee. Widespread rash. Fast heartbeat. Feeling dizzy or light-headed. You may receive your blood transfusion in an outpatient setting. If so, you will be told whom to contact to report any reactions. These symptoms may be an emergency. Do not wait to see if the symptoms will go away. Get medical help right away. Call your local emergency services (911 in the [place] Do not drive yourself to the hospital. Summary Bruising and soreness at the IV site are common. Check your insertion site every day for signs of infection. Rest as told by your doctor. Go back to your normal activities as told by your doctor. Get help right away if you have  signs of a serious reaction. This information is not intended to replace advice given to you by your health care provider. Make sure you discuss any questions you have with your health care provider. Document Revised: 02/02/2021 Document Reviewed: 04/02/2019 Elsevier Patient Education  [place]. Blood Transfusion, Adult A blood transfusion is a procedure in which you receive blood or a type of blood cell (blood component) through an IV. You may need a blood transfusion when your blood level is low. This may result from a bleeding disorder, illness, injury, or surgery. The blood may come from a donor. You may also be able to donate blood for yourself (autologous blood donation) before a planned surgery. The blood given in a transfusion is made up of different blood components. You may receive: Red blood cells. These carry oxygen to the cells in the body. Platelets. These help your blood to clot. Plasma. This is the liquid part of your blood. It carries proteins and other substances throughout the body. White blood cells. These help you fight infections. If you have hemophilia or another clotting disorder, you may also receive other types of blood products. Tell a health care provider about: Any blood disorders you have. Any previous  reactions you have had during a blood transfusion. Any allergies you have. All medicines you are taking, including vitamins, herbs, eye drops, creams, and over-the-counter medicines. Any surgeries you have had. Any medical conditions you have, including any recent fever or cold symptoms. Whether you are pregnant or may be pregnant. What are the risks? Generally, this is a safe procedure. However, problems may occur. The most common problems include: A mild allergic reaction, such as red, swollen areas of skin (hives) and itching. Fever or chills. This may be the body's response to new blood cells received. This may occur during or up to 4 hours after the  transfusion. More serious problems may include: Transfusion-associated circulatory overload (TACO), or too much fluid in the lungs. This may cause breathing problems. A serious allergic reaction, such as difficulty breathing or swelling around the face and lips. Transfusion-related acute lung injury (TRALI), which causes breathing difficulty and low oxygen in the blood. This can occur within hours of the transfusion or several days later. Iron overload. This can happen after receiving many blood transfusions over a period of time. Infection or virus being transmitted. This is rare because donated blood is carefully tested before it is given. Hemolytic transfusion reaction. This is rare. It happens when your body's defense system (immune system)tries to attack the new blood cells. Symptoms may include fever, chills, nausea, low blood pressure, and low back or chest pain. Transfusion-associated graft-versus-host disease (TAGVHD). This is rare. It happens when donated cells attack your body's healthy tissues. What happens before the procedure? Medicines Ask your health care provider about: Changing or stopping your regular medicines. This is especially important if you are taking diabetes medicines or blood thinners. Taking medicines such as aspirin and ibuprofen. These medicines can thin your blood. Do not take these medicines unless your health care provider tells you to take them. Taking over-the-counter medicines, vitamins, herbs, and supplements. General instructions Follow instructions from your health care provider about eating and drinking restrictions. You will have a blood test to determine your blood type. This is necessary to know what kind of blood your body will accept and to match it to the donor blood. If you are going to have a planned surgery, you may be able to do an autologous blood donation. This may be done in case you need to have a transfusion. You will have your temperature,  blood pressure, and pulse monitored before the transfusion. If you have had an allergic reaction to a transfusion in the past, you may be given medicine to help prevent a reaction. This medicine may be given to you by mouth (orally) or through an IV. Set aside time for the blood transfusion. This procedure generally takes 1-4 hours to complete. What happens during the procedure?  An IV will be inserted into one of your veins. The bag of donated blood will be attached to your IV. The blood will then enter through your vein. Your temperature, blood pressure, and pulse will be monitored regularly during the transfusion. This monitoring is done to detect early signs of a transfusion reaction. Tell your nurse right away if you have any of these symptoms during the transfusion: Shortness of breath or trouble breathing. Chest or back pain. Fever or chills. Hives or itching. If you have any signs or symptoms of a reaction, your transfusion will be stopped and you may be given medicine. When the transfusion is complete, your IV will be removed. Pressure may be applied to the IV site for  a few minutes. A bandage (dressing)will be applied. The procedure may vary among health care providers and hospitals. What happens after the procedure? Your temperature, blood pressure, pulse, breathing rate, and blood oxygen level will be monitored until you leave the hospital or clinic. Your blood may be tested to see how you are responding to the transfusion. You may be warmed with fluids or blankets to maintain a normal body temperature. If you receive your blood transfusion in an outpatient setting, you will be told whom to contact to report any reactions. Where to find more information For more information on blood transfusions, visit the American Red Cross: redcross.org Summary A blood transfusion is a procedure in which you receive blood or a type of blood cell (blood component) through an IV. The blood you  receive may come from a donor or be donated by yourself (autologous blood donation) before a planned surgery. The blood given in a transfusion is made up of different blood components. You may receive red blood cells, platelets, plasma, or white blood cells depending on the condition treated. Your temperature, blood pressure, and pulse will be monitored before, during, and after the transfusion. After the transfusion, your blood may be tested to see how your body has responded. This information is not intended to replace advice given to you by your health care provider. Make sure you discuss any questions you have with your health care provider. Document Revised: 08/13/2019 Document Reviewed: 04/02/2019 Elsevier Patient Education  [place].

## 2022-03-13 LAB — TYPE AND SCREEN
ABO/RH(D): B POS
Antibody Screen: NEGATIVE
Unit division: 0

## 2022-03-13 LAB — BPAM RBC
Blood Product Expiration Date: 202306092359
ISSUE DATE / TIME: 202305221210
Unit Type and Rh: 7300

## 2022-03-18 NOTE — Progress Notes (Unsigned)
Cassandra Brennan   [phone] [phone]   Clinic Follow up Note   Patient Care Team: Everardo Beals, NP as PCP - General Erroll Luna, MD as Consulting Physician (General Surgery) Truitt Merle, MD as Consulting Physician (Hematology) Gery Pray, MD as Consulting Physician (Radiation Oncology) [name], RN as Oncology Nurse Navigator [name], RN as Oncology Nurse Navigator 03/20/2022  CHIEF COMPLAINT: Follow up left breast cancer   SUMMARY OF ONCOLOGIC HISTORY: Oncology History Overview Note   Cancer Staging  Malignant neoplasm of upper-inner quadrant of left breast in female, estrogen receptor positive ([place]) Staging form: Breast, AJCC 8th Edition - Clinical stage from 10/27/2021: Stage IIB (cT1c, cN1, cM0, G3, ER+, PR-, HER2-) - Signed by Truitt Merle, MD on 11/07/2021    Malignant neoplasm of upper-inner quadrant of left breast in female, estrogen receptor positive ([place])  10/19/2021 Mammogram   CLINICAL DATA:  Patient presents for palpable left axillary abnormality.   EXAM: DIGITAL DIAGNOSTIC BILATERAL MAMMOGRAM WITH TOMOSYNTHESIS AND CAD; ULTRASOUND LEFT BREAST LIMITED  IMPRESSION: Suspicious left breast mass 10 o'clock position.   Adjacent suspicious satellite nodule left breast 12 o'clock position.   Palpable mass left axilla corresponds with a large lymph node.   10/27/2021 Initial Biopsy   Diagnosis 1. Breast, left, needle core biopsy, 12 o'clock, ribbon - FIBROADENOMA - NO MALIGNANCY IDENTIFIED 2. Breast, left, needle core biopsy, 10 o'clock, coil - INVASIVE DUCTAL CARCINOMA WITH NECROSIS - DUCTAL CARCINOMA IN SITU - SEE COMMENT 3. Lymph node, needle/core biopsy, left axilla, tribell - INVASIVE DUCTAL CARCINOMA WITH NECROSIS - NO NODAL TISSUE IDENTIFIED - SEE COMMENT Microscopic Comment 2. and 2. Based on the biopsy, the carcinoma appears Nottingham grade 3 of 3 and measures 0.8 cm in greatest  linear extent.  3. PROGNOSTIC INDICATORS Results: The tumor cells are NEGATIVE for Her2 (1+). Estrogen Receptor: 30%, POSITIVE, WEAK STAINING INTENSITY Progesterone Receptor: 0%, NEGATIVE Proliferation Marker Ki67: 60%   10/27/2021 Cancer Staging   Staging form: Breast, AJCC 8th Edition - Clinical stage from 10/27/2021: Stage IIB (cT1c, cN1, cM0, G3, ER+, PR-, HER2-) - Signed by [name], NP on 11/28/2021 Stage prefix: Initial diagnosis Histologic grading system: 3 grade system    11/06/2021 Initial Diagnosis   Malignant neoplasm of upper-inner quadrant of left breast in female, estrogen receptor positive ([place])    11/15/2021 Breast MRI   IMPRESSION: 1.9 cm mass in the upper-inner quadrant of the left breast corresponding with the known invasive ductal carcinoma. Enlarged left axillary lymph node corresponding with known metastatic disease.   RECOMMENDATION: Treatment planning of the known left breast cancer and axillary metastasis is recommended.   Additional imaging evaluation of the mass in the liver is recommended with CT with liver mass protocol or MRI.   11/22/2021 Imaging   Bone scan IMPRESSION: No scintigraphic evidence of bony metastatic disease. Degenerative changes as above.   11/22/2021 Echocardiogram   Baseline echo LVEF 55-60%, normal GLS -21.7%   11/23/2021 Imaging   CT CAP IMPRESSION: 1. Bulky lymphadenopathy in the left axilla including a 5.0 x 2.8 cm lymph node. 2. 15 mm nodule identified in the upper inner quadrant of the left breast. 3. No suspicious pulmonary nodule or mass. No evidence for metastatic disease in the abdomen or pelvis. 4. 11 mm cyst in the dome of the left liver. Adjacent tiny hypoattenuating lesions in the right liver are too small to characterize, but likely benign. Attention on follow-up recommended. 5. Prominent stool volume raises  the question of clinical constipation.   11/28/2021 -  Chemotherapy   Patient is on Treatment Plan : BREAST  ADJUVANT DOSE DENSE AC q14d / PACLitaxel q7d       Genetic Testing   [facility] CancerNext was Negative. Report date is 11/26/2021.   The CancerNext gene panel offered by [employer] includes sequencing, rearrangement analysis, and RNA analysis for the following 36 genes:   APC, ATM, AXIN2, BARD1, BMPR1A, BRCA1, BRCA2, BRIP1, CDH1, CDK4, CDKN2A, CHEK2, DICER1, HOXB13, EPCAM, GREM1, MLH1, MSH2, MSH3, MSH6, MUTYH, NBN, NF1, NTHL1, PALB2, PMS2, POLD1, POLE, PTEN, RAD51C, RAD51D, RECQL, SMAD4, SMARCA4, STK11, and TP53.      CURRENT THERAPY: Neoadjuvant ddAC q2 weeks x4 09/27/21 - 01/08/22 followed by weekly Taxol x12 starting 01/22/22  INTERVAL HISTORY: Cassandra Brennan returns for follow up and treatment as scheduled. Last seen by Dr. Burr Medico 03/05/22. She presents today prior to week 9 taxol.  She is doing well overall, remains out of bed and up at home.  She is easily fatigued with mild dyspnea on minimal exertion.  She remains able to complete her normal activities.  She has occasional throat clearing/cough she attributes to BP medication.  Denies chest pain, palpitations.  She is eating and drinking.  Denies neuropathy.  All other systems were reviewed with the patient and are negative.  MEDICAL HISTORY:  Past Medical History:  Diagnosis Date   Anxiety    Arthritis    right knee    GERD (gastroesophageal reflux disease)    Hyperlipidemia    Hypertension    Positive colorectal cancer screening using Cologuard test     SURGICAL HISTORY: Past Surgical History:  Procedure Laterality Date   BREAST CYST EXCISION Right    pt stated in [facility]     other GI testing     unsure but had to drink a chalky drink    PORTACATH PLACEMENT N/A 11/27/2021   Procedure: INSERTION PORT-A-CATH;  Surgeon: Erroll Luna, MD;  Location: [facility];  Service: General;  Laterality: N/A;   TONSILLECTOMY     TUBAL LIGATION      I have reviewed the social history and family history with the patient and they are  unchanged from previous note.  ALLERGIES:  is allergic to prednisone, rosuvastatin, other, robitussin (alcohol free) [guaifenesin], and sulfa antibiotics.  MEDICATIONS:  Current Outpatient Medications  Medication Sig Dispense Refill   potassium chloride (KLOR-CON M) 10 MEQ tablet Take 1 tablet (10 mEq total) by mouth daily. 30 tablet 0   acetaminophen (TYLENOL) 500 MG tablet Take 500 mg by mouth every 8 (eight) hours as needed for moderate pain.     lidocaine-prilocaine (EMLA) cream Apply to affected area once 30 g 3   metoprolol succinate (TOPROL-XL) 100 MG 24 hr tablet Take 100 mg by mouth daily. Take with or immediately following a meal.     ondansetron (ZOFRAN) 8 MG tablet Take 1 tablet (8 mg total) by mouth 2 (two) times daily as needed. Start on the third day after chemotherapy. 30 tablet 1   pravastatin (PRAVACHOL) 20 MG tablet Take 1 tablet (20 mg total) by mouth daily. 30 tablet 0   prochlorperazine (COMPAZINE) 10 MG tablet Take 1 tablet (10 mg total) by mouth every 6 (six) hours as needed (Nausea or vomiting). 30 tablet 1   No current facility-administered medications for this visit.   Facility-Administered Medications Ordered in Other Visits  Medication Dose Route Frequency Provider Last Rate Last Admin  sodium chloride flush (NS) 0.9 % injection 10 mL  10 mL Intracatheter PRN Brunetta Genera, MD   10 mL at 03/20/22 1238    PHYSICAL EXAMINATION: ECOG PERFORMANCE STATUS: 1 - Symptomatic but completely ambulatory  Vitals:   03/20/22 0935  BP: (!) 157/85  Pulse: (!) 113  Temp: 98.5 F (36.9 C)  SpO2: 99%   Filed Weights   03/20/22 0935  Weight: 179 lb 9.6 oz (81.5 kg)    GENERAL:alert, no distress and comfortable SKIN: No rash EYES: sclera clear LUNGS: clear with normal breathing effort HEART: Tachycardic, regular rhythm, no murmurs and no lower extremity edema NEURO: alert & oriented x 3 with fluent speech, no focal motor/sensory deficits Breast exam: No  palpable mass or adenopathy in the left breast or axilla that I could appreciate PAC without erythema  LABORATORY DATA:  I have reviewed the data as listed    Latest Ref Rng & Units 03/20/2022    9:18 AM 03/12/2022    7:41 AM 03/05/2022    7:51 AM  CBC  WBC 4.0 - 10.5 K/uL 4.0   4.4   4.1    Hemoglobin 12.0 - 15.0 g/dL 8.9   8.1   8.3    Hematocrit 36.0 - 46.0 % 26.9   23.8   24.5    Platelets 150 - 400 K/uL 243   226   228          Latest Ref Rng & Units 03/20/2022    9:18 AM 03/12/2022    7:41 AM 03/05/2022    7:51 AM  CMP  Glucose 70 - 99 mg/dL 131   126   120    BUN 8 - 23 mg/dL _0 Creatinine 0.44 - 1.00 mg/dL 0.69   0.75   0.71    Sodium 135 - 145 mmol/L 139   138   140    Potassium 3.5 - 5.1 mmol/L 3.4   3.4   3.5    Chloride 98 - 111 mmol/L 107   106   108    CO2 22 - 32 mmol/L _1 Calcium 8.9 - 10.3 mg/dL 9.4   8.7   9.0    Total Protein 6.5 - 8.1 g/dL 6.9   7.0   6.6    Total Bilirubin 0.3 - 1.2 mg/dL 0.6   0.6   0.6    Alkaline Phos 38 - 126 U/L 84   89   78    AST 15 - 41 U/L _2 ALT 0 - 44 U/L _3 RADIOGRAPHIC STUDIES: I have personally reviewed the radiological images as listed and agreed with the findings in the report. No results found.   ASSESSMENT & PLAN: Cassandra Brennan is a 66 y.o. postmenopausal female    1. 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 B/l diagnostic MM and left [place] 10/19/21 showed: 2 cm mass at 10 o'clock; 5 mm indeterminate satellite mass at 12 o'clock; palpable left axilla mass corresponds with large lymph node. Biopsy 10/27/21 confirmed invasive ductal carcinoma with necrosis to both 10 o'clock and lymph node, grade 3. ER weakly positive (30%). -given her large positive lymph  node, weak ER positivity, this is similar to triple negative disease and predicts very  high risk for recurrence after surgery.  Neoadjuvant chemotherapy has been recommended.  She was seen by Dr. Brantley Stage and will likely proceed with lumpectomy, SLNB and targeted lymph node dissection following chemo.  She would benefit from adjuvant radiation if she has lumpectomy. -breast MRI 11/15/2021 was previously reviewed, which shows the biopsy proven malignancy in the upper inner left breat measuring 1.5 x1.3 x1.9 cm, a biopsy proven malignant 4.3 cm left axillary LN and 2 other abnormal LNs measuring 1.3 and 1.5 cm, and a 1.3 liver mass likely representing a cyst.  -CT CAP 11/23/2021 was previously reviewed, which shows known left breast cancer and bulky axillary adenopathy, a liver cyst, and tiny indeterminate liver nodules too small to characterize. We will monitor these in the future. No evidence of distant metastasis.  -Bone scan is negative for osseous metastasis.  -Baseline echo is normal, LVEF 55-60%. -Left breast mass in the upper inner quadrant measured 2 cm and 2 palpable lymph nodes largest measuring 4.5 cm on 2/7 the day she began neoadjuvant chemo, for reference -She began neoadjuvant AC with cycle 09/27/22, tolerated well with mild fatigue and nausea.   -S/p cycle 2 AC the upper inner left breast mass is no longer appreciable and axillary adenopathy has improved. -S/p cycle 4 AC the left breast mass and axillary adenopathy are not palpable, c/w a clinical response to neoadjuvant chemo        -began weekly taxol 01/22/22, s/p 8 cycles -Cassandra Brennan is clinically doing well.  S/p 8 cycles of weekly Taxol, tolerating well except fatigue and exertional dyspnea.  This is likely related to anemia and treatment.  She is able to recover and function well.  Physical exam is consistent with a clinical response to neoadjuvant chemo. -Labs reviewed, adequate to proceed with cycle 9 weekly Taxol today as planned. -Return next week for cycle 10, then follow-up in 2 weeks for cycle 11.  Complete cycle 12  in 3 weeks. -Repeat breast MRI after she completes neoadjuvant chemo, then follow-up to review results  2.  Exertional dyspnea, tachycardia, fatigue -Likely secondary to chemo and anemia -Hgb 8.9 today -We will add type and screen next week in the event she may need additional blood transfusion -Given that she received Adriamycin, will repeat echo   3.  Hypertension and anxiety -Continue medications. -Follow-up with primary care physician -she became very anxious last week when she had exhaustion, low BP, and fast heart rate at home. She came in, found to be pancytopenic, s/p RBC transfusion and feels better today -I will refill metoprolol and xanax  Plan: -Labs reviewed -Proceed with cycle 9 weekly Taxol today as planned, same dose -Return next week for cycle 10, follow-up in 2 weeks with cycle 11 -Complete 12 weeks total then repeat MRI -repeat Echo for dyspnea -f/up after MRI -Rx: KCL po once daily    Orders Placed This Encounter  Procedures   MR BREAST BILATERAL W WO CONTRAST INC CAD    Standing Status:   Future    Standing Expiration Date:   03/21/2023    Order Specific Question:   If indicated for the ordered procedure, I authorize the administration of contrast media per Radiology protocol    Answer:   Yes    Order Specific Question:   What is the patient's sedation requirement?    Answer:   No Sedation    Order Specific Question:  Does the patient have a pacemaker or implanted devices?    Answer:   No    Order Specific Question:   Preferred imaging location?    Answer:   [facility] (table limit - 550 lbs)   ECHOCARDIOGRAM COMPLETE    Standing Status:   Future    Standing Expiration Date:   03/21/2023    Order Specific Question:   Where should this test be performed    Answer:   [place]    Order Specific Question:   Perflutren DEFINITY (image enhancing agent) should be administered unless hypersensitivity or allergy exist    Answer:   Administer  Perflutren    Order Specific Question:   Does this study need to be read by the Structural team/Level 3 readers?    Answer:   No    Order Specific Question:   Reason for exam-Echo    Answer:   Dyspnea  R06.00    Order Specific Question:   Other Comments    Answer:   dyspnea on chemo, anemia; h/o anthracycline r/o cardio toxicity   Type and screen         Standing Status:   Standing    Number of Occurrences:   4    Standing Expiration Date:   03/21/2023   All questions were answered. The patient knows to call the clinic with any problems, questions or concerns. No barriers to learning was detected. I spent 20 minutes counseling the patient face to face. The total time spent in the appointment was 30 minutes and more than 50% was on counseling and review of test results     [name], NP 03/20/22

## 2022-03-20 ENCOUNTER — Other Ambulatory Visit: Payer: Self-pay

## 2022-03-20 ENCOUNTER — Encounter: Payer: Self-pay | Admitting: Nurse Practitioner

## 2022-03-20 ENCOUNTER — Inpatient Hospital Stay: Payer: Commercial Managed Care - HMO | Attending: Hematology

## 2022-03-20 ENCOUNTER — Inpatient Hospital Stay (HOSPITAL_BASED_OUTPATIENT_CLINIC_OR_DEPARTMENT_OTHER): Payer: Commercial Managed Care - HMO | Admitting: Nurse Practitioner

## 2022-03-20 ENCOUNTER — Other Ambulatory Visit: Payer: Self-pay | Admitting: Hematology

## 2022-03-20 ENCOUNTER — Encounter: Payer: Self-pay | Admitting: *Deleted

## 2022-03-20 ENCOUNTER — Inpatient Hospital Stay: Payer: Commercial Managed Care - HMO

## 2022-03-20 ENCOUNTER — Other Ambulatory Visit: Payer: Self-pay | Admitting: Nurse Practitioner

## 2022-03-20 VITALS — BP 137/85 | HR 95 | Resp 18

## 2022-03-20 VITALS — BP 157/85 | HR 113 | Temp 98.5°F | Wt 179.6 lb

## 2022-03-20 DIAGNOSIS — C50212 Malignant neoplasm of upper-inner quadrant of left female breast: Secondary | ICD-10-CM | POA: Diagnosis not present

## 2022-03-20 DIAGNOSIS — Z170 Estrogen receptor positive status [ER+]: Secondary | ICD-10-CM

## 2022-03-20 DIAGNOSIS — Z95828 Presence of other vascular implants and grafts: Secondary | ICD-10-CM

## 2022-03-20 DIAGNOSIS — I2609 Other pulmonary embolism with acute cor pulmonale: Secondary | ICD-10-CM | POA: Diagnosis not present

## 2022-03-20 LAB — CMP (CANCER CENTER ONLY)
ALT: 23 U/L (ref 0–44)
AST: 16 U/L (ref 15–41)
Albumin: 4 g/dL (ref 3.5–5.0)
Alkaline Phosphatase: 84 U/L (ref 38–126)
Anion gap: 6 (ref 5–15)
BUN: 13 mg/dL (ref 8–23)
CO2: 26 mmol/L (ref 22–32)
Calcium: 9.4 mg/dL (ref 8.9–10.3)
Chloride: 107 mmol/L (ref 98–111)
Creatinine: 0.69 mg/dL (ref 0.44–1.00)
GFR, Estimated: >60 mL/min (ref >60)
Glucose, Bld: 131 mg/dL — ABNORMAL HIGH (ref 70–99)
Potassium: 3.4 mmol/L — ABNORMAL LOW (ref 3.5–5.1)
Sodium: 139 mmol/L (ref 135–145)
Total Bilirubin: 0.6 mg/dL (ref 0.3–1.2)
Total Protein: 6.9 g/dL (ref 6.5–8.1)

## 2022-03-20 LAB — CBC WITH DIFFERENTIAL (CANCER CENTER ONLY)
Abs Immature Granulocytes: 0.02 10*3/uL (ref 0.00–0.07)
Basophils Absolute: 0 10*3/uL (ref 0.0–0.1)
Basophils Relative: 1 %
Eosinophils Absolute: 0.1 10*3/uL (ref 0.0–0.5)
Eosinophils Relative: 2 %
HCT: 26.9 % — ABNORMAL LOW (ref 36.0–46.0)
Hemoglobin: 8.9 g/dL — ABNORMAL LOW (ref 12.0–15.0)
Immature Granulocytes: 1 %
Lymphocytes Relative: 12 %
Lymphs Abs: 0.5 10*3/uL — ABNORMAL LOW (ref 0.7–4.0)
MCH: 27.9 pg (ref 26.0–34.0)
MCHC: 33.1 g/dL (ref 30.0–36.0)
MCV: 84.3 fL (ref 80.0–100.0)
Monocytes Absolute: 0.3 10*3/uL (ref 0.1–1.0)
Monocytes Relative: 7 %
Neutro Abs: 3.1 10*3/uL (ref 1.7–7.7)
Neutrophils Relative %: 77 %
Platelet Count: 243 10*3/uL (ref 150–400)
RBC: 3.19 MIL/uL — ABNORMAL LOW (ref 3.87–5.11)
RDW: 17.9 % — ABNORMAL HIGH (ref 11.5–15.5)
WBC Count: 4 10*3/uL (ref 4.0–10.5)
nRBC: 0 % (ref 0.0–0.2)

## 2022-03-20 MED ORDER — DIPHENHYDRAMINE HCL 50 MG/ML IJ SOLN
25.0000 mg | Freq: Once | INTRAMUSCULAR | Status: AC
  Administered 2022-03-20: 25 mg via INTRAVENOUS
  Filled 2022-03-20: qty 1

## 2022-03-20 MED ORDER — DEXAMETHASONE SODIUM PHOSPHATE 10 MG/ML IJ SOLN
5.0000 mg | Freq: Once | INTRAMUSCULAR | Status: AC
  Administered 2022-03-20: 5 mg via INTRAVENOUS
  Filled 2022-03-20: qty 1

## 2022-03-20 MED ORDER — SODIUM CHLORIDE 0.9 % IV SOLN: Freq: Once | INTRAVENOUS | Status: AC

## 2022-03-20 MED ORDER — HEPARIN SOD (PORK) LOCK FLUSH 100 UNIT/ML IV SOLN
500.0000 [IU] | Freq: Once | INTRAVENOUS | Status: AC | PRN
  Administered 2022-03-20: 500 [IU]

## 2022-03-20 MED ORDER — POTASSIUM CHLORIDE CRYS ER 10 MEQ PO TBCR: 10.0000 meq | EXTENDED_RELEASE_TABLET | Freq: Every day | ORAL | 0 refills | Status: DC

## 2022-03-20 MED ORDER — SODIUM CHLORIDE 0.9 % IV SOLN
80.0000 mg/m2 | Freq: Once | INTRAVENOUS | Status: AC
  Administered 2022-03-20: 156 mg via INTRAVENOUS
  Filled 2022-03-20: qty 26

## 2022-03-20 MED ORDER — SODIUM CHLORIDE 0.9% FLUSH
10.0000 mL | INTRAVENOUS | Status: DC | PRN
  Administered 2022-03-20: 10 mL

## 2022-03-20 MED ORDER — SODIUM CHLORIDE 0.9% FLUSH
10.0000 mL | Freq: Once | INTRAVENOUS | Status: AC
  Administered 2022-03-20: 10 mL

## 2022-03-20 MED ORDER — FAMOTIDINE IN NACL 20-0.9 MG/50ML-% IV SOLN
20.0000 mg | Freq: Once | INTRAVENOUS | Status: AC
  Administered 2022-03-20: 20 mg via INTRAVENOUS
  Filled 2022-03-20: qty 50

## 2022-03-20 NOTE — Patient Instructions (Addendum)
[facility]  Discharge Instructions: Thank you for choosing [facility] to provide your oncology and hematology care.   If you have a lab appointment with the [facility], please go directly to the [facility] and check in at the registration area.   Wear comfortable clothing and clothing appropriate for easy access to any Portacath or PICC line.   We strive to give you quality time with your provider. You may need to reschedule your appointment if you arrive late (15 or more minutes).  Arriving late affects you and other patients whose appointments are after yours.  Also, if you miss three or more appointments without notifying the office, you may be dismissed from the clinic at the provider's discretion.      For prescription refill requests, have your pharmacy contact our office and allow 72 hours for refills to be completed.    Today you received the following chemotherapy and/or immunotherapy agents: Taxol      To help prevent nausea and vomiting after your treatment, we encourage you to take your nausea medication as directed.  BELOW ARE SYMPTOMS THAT SHOULD BE REPORTED IMMEDIATELY: *FEVER GREATER THAN 100.4 F (38 C) OR HIGHER *CHILLS OR SWEATING *NAUSEA AND VOMITING THAT IS NOT CONTROLLED WITH YOUR NAUSEA MEDICATION *UNUSUAL SHORTNESS OF BREATH *UNUSUAL BRUISING OR BLEEDING *URINARY PROBLEMS (pain or burning when urinating, or frequent urination) *BOWEL PROBLEMS (unusual diarrhea, constipation, pain near the anus) TENDERNESS IN MOUTH AND THROAT WITH OR WITHOUT PRESENCE OF ULCERS (sore throat, sores in mouth, or a toothache) UNUSUAL RASH, SWELLING OR PAIN  UNUSUAL VAGINAL DISCHARGE OR ITCHING   Items with * indicate a potential emergency and should be followed up as soon as possible or go to the Emergency Department if any problems should occur.  Please show the CHEMOTHERAPY ALERT CARD or IMMUNOTHERAPY ALERT CARD at check-in to the  Emergency Department and triage nurse.  Should you have questions after your visit or need to cancel or reschedule your appointment, please contact [facility]  Dept: [phone]  and follow the prompts.  Office hours are 8:00 a.m. to 4:30 p.m. Monday - Friday. Please note that voicemails left after 4:00 p.m. may not be returned until the following business day.  We are closed weekends and major holidays. You have access to a nurse at all times for urgent questions. Please call the main number to the clinic Dept: [phone] and follow the prompts.   For any non-urgent questions, you may also contact your provider using MyChart. We now offer e-Visits for anyone 30 and older to request care online for non-urgent symptoms. For details visit [email or web address].   Also download the MyChart app! Go to the app store, search "MyChart", open the app, select [facility], and log in with your MyChart username and password.  Due to Covid, a mask is required upon entering the hospital/clinic. If you do not have a mask, one will be given to you upon arrival. For doctor visits, patients may have 1 support person aged 65 or older with them. For treatment visits, patients cannot have anyone with them due to current Covid guidelines and our immunocompromised population.   Hypokalemia Hypokalemia means that the amount of potassium in the blood is lower than normal. Potassium is a mineral (electrolyte) that helps regulate the amount of fluid in the body. It also stimulates muscle tightening (contraction) and helps nerves work properly. Normally, most of the body's potassium is inside  cells, and only a very small amount is in the blood. Because the amount in the blood is so small, minor changes to potassium levels in the blood can be life-threatening. What are the causes? This condition may be caused by: Antibiotic medicine. Diarrhea or vomiting. Taking too much of a medicine that  helps you have a bowel movement (laxative) can cause diarrhea and lead to hypokalemia. Chronic kidney disease (CKD). Medicines that help the body get rid of excess fluid (diuretics). Eating disorders, such as anorexia or bulimia. Low magnesium levels in the body. Sweating a lot. What are the signs or symptoms? Symptoms of this condition include: Weakness. Constipation. Fatigue. Muscle cramps. Mental confusion. Skipped heartbeats or irregular heartbeat (palpitations). Tingling or numbness. How is this diagnosed? This condition is diagnosed with a blood test. How is this treated? This condition may be treated by: Taking potassium supplements. Adjusting the medicines that you take. Eating more foods that contain a lot of potassium. If your potassium level is very low, you may need to get potassium through an IV and be monitored in the hospital. Follow these instructions at home: Eating and drinking  Eat a healthy diet. A healthy diet includes fresh fruits and vegetables, whole grains, healthy fats, and lean proteins. If told, eat more foods that contain a lot of potassium. These include: Nuts, such as peanuts and pistachios. Seeds, such as sunflower seeds and pumpkin seeds. Peas, lentils, and lima beans. Whole grain and bran cereals and breads. Fresh fruits and vegetables, such as apricots, avocado, bananas, cantaloupe, kiwi, oranges, tomatoes, asparagus, and potatoes. Juices, such as orange, tomato, and prune. Lean meats, including fish. Milk and milk products, such as yogurt. General instructions Take over-the-counter and prescription medicines only as told by your health care provider. This includes vitamins, natural food products, and supplements. Keep all follow-up visits. This is important. Contact a health care provider if: You have weakness that gets worse. You feel your heart pounding or racing. You vomit. You have diarrhea. You have diabetes and you have trouble  keeping your blood sugar in your target range. Get help right away if: You have chest pain. You have shortness of breath. You have vomiting or diarrhea that lasts for more than 2 days. You faint. These symptoms may be an emergency. Get help right away. Call 911. Do not wait to see if the symptoms will go away. Do not drive yourself to the hospital. Summary Hypokalemia means that the amount of potassium in the blood is lower than normal. This condition is diagnosed with a blood test. Hypokalemia may be treated by taking potassium supplements, adjusting the medicines that you take, or eating more foods that are high in potassium. If your potassium level is very low, you may need to get potassium through an IV and be monitored in the hospital. This information is not intended to replace advice given to you by your health care provider. Make sure you discuss any questions you have with your health care provider. Document Revised: 06/22/2021 Document Reviewed: 06/22/2021 Elsevier Patient Education  [place].

## 2022-03-21 ENCOUNTER — Encounter: Payer: Self-pay | Admitting: Hematology

## 2022-03-22 ENCOUNTER — Inpatient Hospital Stay (HOSPITAL_COMMUNITY): Payer: Commercial Managed Care - HMO

## 2022-03-22 ENCOUNTER — Encounter (HOSPITAL_COMMUNITY): Payer: Self-pay | Admitting: Emergency Medicine

## 2022-03-22 ENCOUNTER — Telehealth: Payer: Self-pay | Admitting: Hematology

## 2022-03-22 ENCOUNTER — Emergency Department (HOSPITAL_COMMUNITY): Payer: Commercial Managed Care - HMO

## 2022-03-22 ENCOUNTER — Other Ambulatory Visit: Payer: Self-pay

## 2022-03-22 ENCOUNTER — Inpatient Hospital Stay (HOSPITAL_COMMUNITY)
Admission: EM | Admit: 2022-03-22 | Discharge: 2022-03-25 | DRG: 175 | Disposition: A | Discharge status: Home / Self Care | Payer: Commercial Managed Care - HMO | Attending: Internal Medicine | Admitting: Internal Medicine

## 2022-03-22 DIAGNOSIS — I2609 Other pulmonary embolism with acute cor pulmonale: Secondary | ICD-10-CM | POA: Diagnosis present

## 2022-03-22 DIAGNOSIS — Z882 Allergy status to sulfonamides status: Secondary | ICD-10-CM | POA: Diagnosis not present

## 2022-03-22 DIAGNOSIS — I82411 Acute embolism and thrombosis of right femoral vein: Secondary | ICD-10-CM | POA: Diagnosis present

## 2022-03-22 DIAGNOSIS — E872 Acidosis, unspecified: Secondary | ICD-10-CM

## 2022-03-22 DIAGNOSIS — R7401 Elevation of levels of liver transaminase levels: Secondary | ICD-10-CM | POA: Diagnosis present

## 2022-03-22 DIAGNOSIS — D649 Anemia, unspecified: Secondary | ICD-10-CM

## 2022-03-22 DIAGNOSIS — J189 Pneumonia, unspecified organism: Secondary | ICD-10-CM | POA: Diagnosis present

## 2022-03-22 DIAGNOSIS — Z79899 Other long term (current) drug therapy: Secondary | ICD-10-CM | POA: Diagnosis not present

## 2022-03-22 DIAGNOSIS — R778 Other specified abnormalities of plasma proteins: Secondary | ICD-10-CM | POA: Diagnosis present

## 2022-03-22 DIAGNOSIS — Z20822 Contact with and (suspected) exposure to covid-19: Secondary | ICD-10-CM | POA: Diagnosis present

## 2022-03-22 DIAGNOSIS — G9341 Metabolic encephalopathy: Secondary | ICD-10-CM | POA: Diagnosis present

## 2022-03-22 DIAGNOSIS — I2699 Other pulmonary embolism without acute cor pulmonale: Secondary | ICD-10-CM | POA: Diagnosis present

## 2022-03-22 DIAGNOSIS — K219 Gastro-esophageal reflux disease without esophagitis: Secondary | ICD-10-CM

## 2022-03-22 DIAGNOSIS — D638 Anemia in other chronic diseases classified elsewhere: Secondary | ICD-10-CM | POA: Diagnosis present

## 2022-03-22 DIAGNOSIS — E785 Hyperlipidemia, unspecified: Secondary | ICD-10-CM | POA: Diagnosis present

## 2022-03-22 DIAGNOSIS — R739 Hyperglycemia, unspecified: Secondary | ICD-10-CM | POA: Diagnosis present

## 2022-03-22 DIAGNOSIS — J9600 Acute respiratory failure, unspecified whether with hypoxia or hypercapnia: Secondary | ICD-10-CM | POA: Diagnosis present

## 2022-03-22 DIAGNOSIS — R578 Other shock: Secondary | ICD-10-CM | POA: Diagnosis present

## 2022-03-22 DIAGNOSIS — I82401 Acute embolism and thrombosis of unspecified deep veins of right lower extremity: Secondary | ICD-10-CM

## 2022-03-22 DIAGNOSIS — F419 Anxiety disorder, unspecified: Secondary | ICD-10-CM | POA: Diagnosis present

## 2022-03-22 DIAGNOSIS — N179 Acute kidney failure, unspecified: Secondary | ICD-10-CM | POA: Diagnosis present

## 2022-03-22 DIAGNOSIS — Z888 Allergy status to other drugs, medicaments and biological substances status: Secondary | ICD-10-CM

## 2022-03-22 DIAGNOSIS — R7989 Other specified abnormal findings of blood chemistry: Secondary | ICD-10-CM

## 2022-03-22 DIAGNOSIS — R579 Shock, unspecified: Secondary | ICD-10-CM | POA: Diagnosis present

## 2022-03-22 DIAGNOSIS — C50212 Malignant neoplasm of upper-inner quadrant of left female breast: Secondary | ICD-10-CM | POA: Diagnosis present

## 2022-03-22 DIAGNOSIS — R570 Cardiogenic shock: Principal | ICD-10-CM

## 2022-03-22 DIAGNOSIS — I10 Essential (primary) hypertension: Secondary | ICD-10-CM | POA: Diagnosis present

## 2022-03-22 DIAGNOSIS — Z170 Estrogen receptor positive status [ER+]: Secondary | ICD-10-CM

## 2022-03-22 DIAGNOSIS — Z9221 Personal history of antineoplastic chemotherapy: Secondary | ICD-10-CM

## 2022-03-22 DIAGNOSIS — R0600 Dyspnea, unspecified: Secondary | ICD-10-CM

## 2022-03-22 HISTORY — DX: Other specified abnormal findings of blood chemistry: R79.89

## 2022-03-22 HISTORY — DX: Other specified abnormalities of plasma proteins: R77.8

## 2022-03-22 HISTORY — DX: Metabolic encephalopathy: G93.41

## 2022-03-22 HISTORY — DX: Other shock: R57.8

## 2022-03-22 HISTORY — DX: Acidosis, unspecified: E87.20

## 2022-03-22 HISTORY — DX: Dyspnea, unspecified: R06.00

## 2022-03-22 LAB — CBC WITH DIFFERENTIAL/PLATELET
Abs Immature Granulocytes: 0.03 10*3/uL (ref 0.00–0.07)
Basophils Absolute: 0 10*3/uL (ref 0.0–0.1)
Basophils Relative: 0 %
Eosinophils Absolute: 0 10*3/uL (ref 0.0–0.5)
Eosinophils Relative: 0 %
HCT: 28.4 % — ABNORMAL LOW (ref 36.0–46.0)
Hemoglobin: 9.2 g/dL — ABNORMAL LOW (ref 12.0–15.0)
Immature Granulocytes: 1 %
Lymphocytes Relative: 5 %
Lymphs Abs: 0.3 10*3/uL — ABNORMAL LOW (ref 0.7–4.0)
MCH: 28.1 pg (ref 26.0–34.0)
MCHC: 32.4 g/dL (ref 30.0–36.0)
MCV: 86.9 fL (ref 80.0–100.0)
Monocytes Absolute: 0.3 10*3/uL (ref 0.1–1.0)
Monocytes Relative: 5 %
Neutro Abs: 4.6 10*3/uL (ref 1.7–7.7)
Neutrophils Relative %: 89 %
Platelets: 248 10*3/uL (ref 150–400)
RBC: 3.27 MIL/uL — ABNORMAL LOW (ref 3.87–5.11)
RDW: 17.8 % — ABNORMAL HIGH (ref 11.5–15.5)
WBC: 5.2 10*3/uL (ref 4.0–10.5)
nRBC: 0 % (ref 0.0–0.2)

## 2022-03-22 LAB — COMPREHENSIVE METABOLIC PANEL
ALT: 32 U/L (ref 0–44)
ALT: 90 U/L — ABNORMAL HIGH (ref 0–44)
AST: 27 U/L (ref 15–41)
AST: 73 U/L — ABNORMAL HIGH (ref 15–41)
Albumin: 3.6 g/dL (ref 3.5–5.0)
Albumin: 2.5 g/dL — ABNORMAL LOW (ref 3.5–5.0)
Alkaline Phosphatase: 87 U/L (ref 38–126)
Alkaline Phosphatase: 70 U/L (ref 38–126)
Anion gap: 9 (ref 5–15)
Anion gap: 12 (ref 5–15)
BUN: 15 mg/dL (ref 8–23)
BUN: 16 mg/dL (ref 8–23)
CO2: 22 mmol/L (ref 22–32)
CO2: 14 mmol/L — ABNORMAL LOW (ref 22–32)
Calcium: 8.8 mg/dL — ABNORMAL LOW (ref 8.9–10.3)
Calcium: 6.9 mg/dL — ABNORMAL LOW (ref 8.9–10.3)
Chloride: 111 mmol/L (ref 98–111)
Chloride: 114 mmol/L — ABNORMAL HIGH (ref 98–111)
Creatinine, Ser: 0.78 mg/dL (ref 0.44–1.00)
Creatinine, Ser: 1.41 mg/dL — ABNORMAL HIGH (ref 0.44–1.00)
GFR, Estimated: >60 mL/min (ref >60)
GFR, Estimated: 42 mL/min — ABNORMAL LOW (ref >60)
Glucose, Bld: 140 mg/dL — ABNORMAL HIGH (ref 70–99)
Glucose, Bld: 257 mg/dL — ABNORMAL HIGH (ref 70–99)
Potassium: 3.9 mmol/L (ref 3.5–5.1)
Potassium: 4.2 mmol/L (ref 3.5–5.1)
Sodium: 142 mmol/L (ref 135–145)
Sodium: 140 mmol/L (ref 135–145)
Total Bilirubin: 1.1 mg/dL (ref 0.3–1.2)
Total Bilirubin: 1.2 mg/dL (ref 0.3–1.2)
Total Protein: 6.7 g/dL (ref 6.5–8.1)
Total Protein: 4.9 g/dL — ABNORMAL LOW (ref 6.5–8.1)

## 2022-03-22 LAB — CBC
HCT: 30.2 % — ABNORMAL LOW (ref 36.0–46.0)
HCT: 25.9 % — ABNORMAL LOW (ref 36.0–46.0)
Hemoglobin: 9 g/dL — ABNORMAL LOW (ref 12.0–15.0)
Hemoglobin: 7.8 g/dL — ABNORMAL LOW (ref 12.0–15.0)
MCH: 27.9 pg (ref 26.0–34.0)
MCH: 27.6 pg (ref 26.0–34.0)
MCHC: 29.8 g/dL — ABNORMAL LOW (ref 30.0–36.0)
MCHC: 30.1 g/dL (ref 30.0–36.0)
MCV: 93.5 fL (ref 80.0–100.0)
MCV: 91.5 fL (ref 80.0–100.0)
Platelets: 184 10*3/uL (ref 150–400)
Platelets: 148 10*3/uL — ABNORMAL LOW (ref 150–400)
RBC: 3.23 MIL/uL — ABNORMAL LOW (ref 3.87–5.11)
RBC: 2.83 MIL/uL — ABNORMAL LOW (ref 3.87–5.11)
RDW: 18.1 % — ABNORMAL HIGH (ref 11.5–15.5)
RDW: 18.3 % — ABNORMAL HIGH (ref 11.5–15.5)
WBC: 6 10*3/uL (ref 4.0–10.5)
WBC: 6.6 10*3/uL (ref 4.0–10.5)
nRBC: 1 % — ABNORMAL HIGH (ref 0.0–0.2)
nRBC: 1.2 % — ABNORMAL HIGH (ref 0.0–0.2)

## 2022-03-22 LAB — URINALYSIS, ROUTINE W REFLEX MICROSCOPIC
Bilirubin Urine: NEGATIVE
Glucose, UA: 50 mg/dL — AB
Ketones, ur: NEGATIVE mg/dL
Nitrite: NEGATIVE
Protein, ur: >=300 mg/dL — AB
Specific Gravity, Urine: 1.017 (ref 1.005–1.030)
pH: 8 (ref 5.0–8.0)

## 2022-03-22 LAB — HIV ANTIBODY (ROUTINE TESTING W REFLEX): HIV Screen 4th Generation wRfx: NONREACTIVE

## 2022-03-22 LAB — LACTIC ACID, PLASMA
Lactic Acid, Venous: 2 mmol/L (ref 0.5–1.9)
Lactic Acid, Venous: 4.4 mmol/L (ref 0.5–1.9)
Lactic Acid, Venous: 8.2 mmol/L (ref 0.5–1.9)
Lactic Acid, Venous: 6.1 mmol/L (ref 0.5–1.9)

## 2022-03-22 LAB — TROPONIN I (HIGH SENSITIVITY)
Troponin I (High Sensitivity): 352 ng/L (ref <18)
Troponin I (High Sensitivity): 410 ng/L (ref <18)

## 2022-03-22 LAB — BLOOD GAS, VENOUS
Acid-base deficit: 14.6 mmol/L — ABNORMAL HIGH (ref 0.0–2.0)
Bicarbonate: 15.2 mmol/L — ABNORMAL LOW (ref 20.0–28.0)
O2 Saturation: 12.6 %
Patient temperature: 37
pCO2, Ven: 50 mmHg (ref 44–60)
pH, Ven: 7.09 — CL (ref 7.25–7.43)
pO2, Ven: <31 mmHg — CL (ref 32–45)

## 2022-03-22 LAB — BRAIN NATRIURETIC PEPTIDE: B Natriuretic Peptide: 95 pg/mL (ref 0.0–100.0)

## 2022-03-22 LAB — PREPARE RBC (CROSSMATCH)

## 2022-03-22 LAB — PROTIME-INR
INR: 1.6 — ABNORMAL HIGH (ref 0.8–1.2)
Prothrombin Time: 18.6 seconds — ABNORMAL HIGH (ref 11.4–15.2)

## 2022-03-22 LAB — PROCALCITONIN: Procalcitonin: <0.1 ng/mL

## 2022-03-22 LAB — MAGNESIUM: Magnesium: 1.9 mg/dL (ref 1.7–2.4)

## 2022-03-22 LAB — SARS CORONAVIRUS 2 BY RT PCR: SARS Coronavirus 2 by RT PCR: NEGATIVE

## 2022-03-22 LAB — LIPASE, BLOOD: Lipase: 23 U/L (ref 11–51)

## 2022-03-22 LAB — APTT: aPTT: 35 seconds (ref 24–36)

## 2022-03-22 LAB — STREP PNEUMONIAE URINARY ANTIGEN: Strep Pneumo Urinary Antigen: NEGATIVE

## 2022-03-22 IMAGING — CT CT ABD-PELV W/ CM
2 of 5 series · 14 of 46 positions shown, 16 images · IV contrast (agent unspecified)
Comparison: [DATE].

CLINICAL DATA: Shortness of breath, acute generalized abdominal
pain.



[Series 4: axial st · axial · 0.70mm/px · z∈[-483,-88]mm · 11 of 93 slices shown, 13 images]
[im 7/93  soft-tissue]
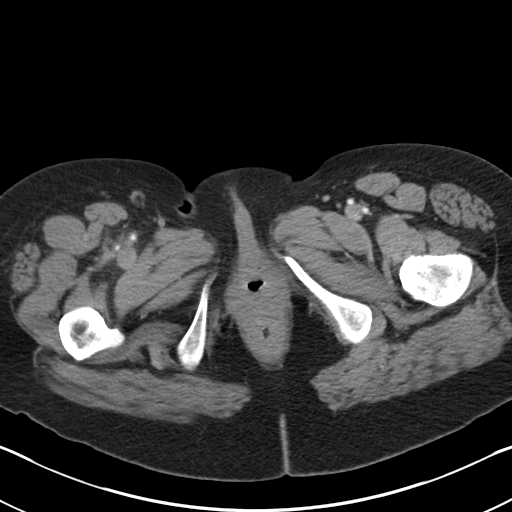
[im 7/93  bone]
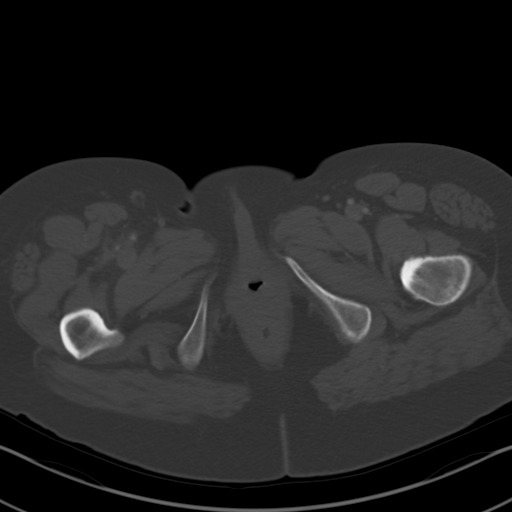
[im 13/93  soft-tissue]
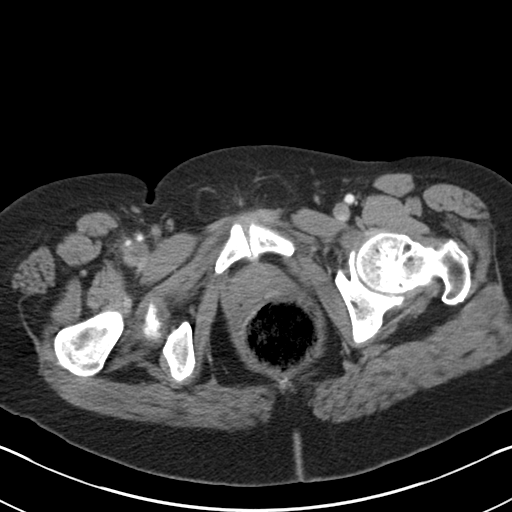
[im 25/93  soft-tissue]
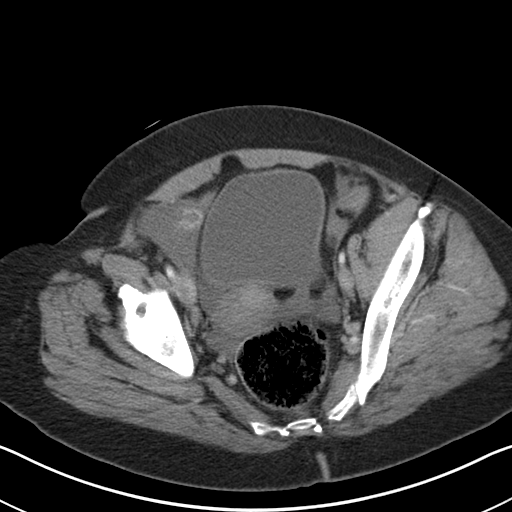
[im 31/93  soft-tissue]
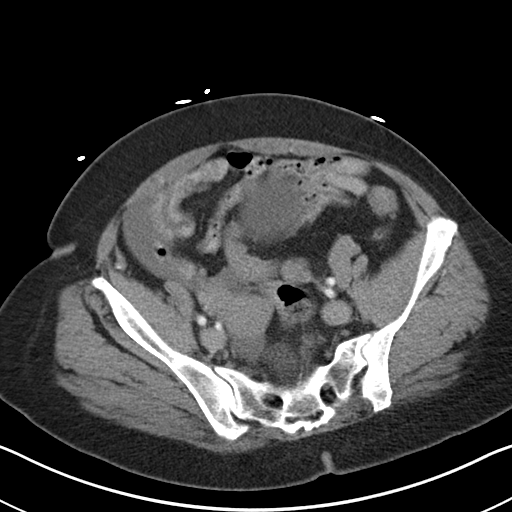
[im 37/93  soft-tissue]
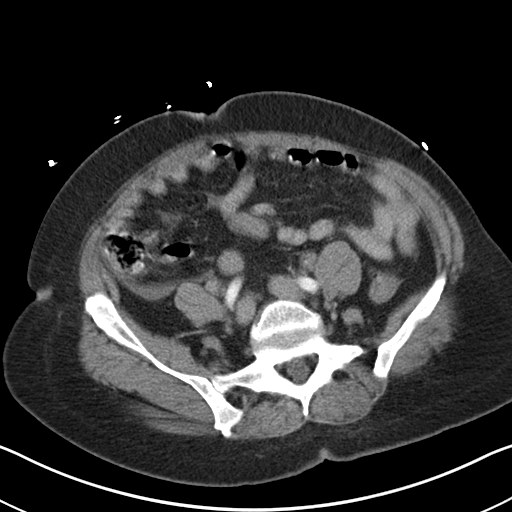
[im 50/93  soft-tissue]
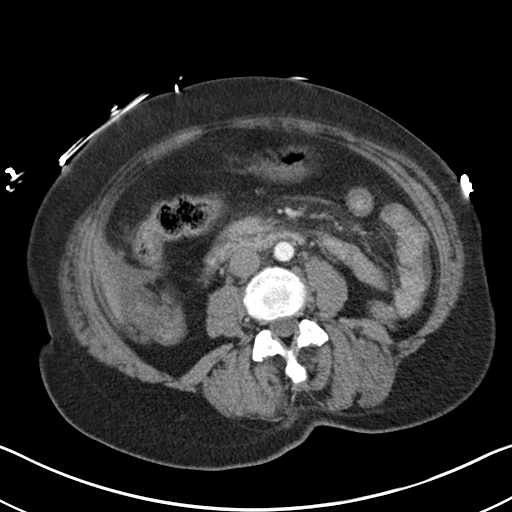
[im 56/93  soft-tissue]
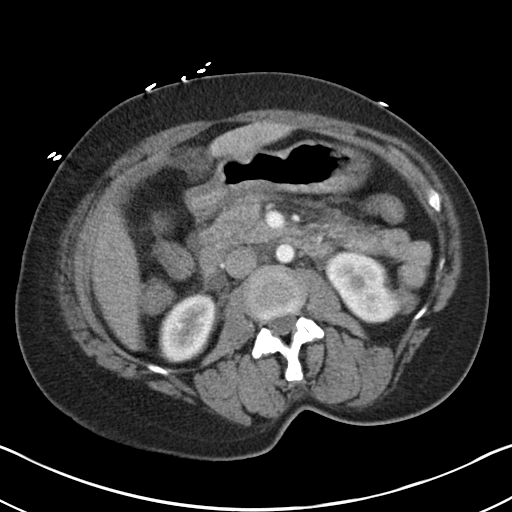
[im 62/93  soft-tissue]
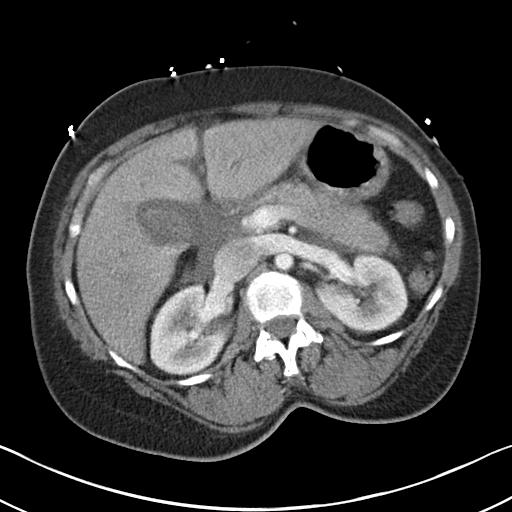
[im 68/93  soft-tissue]
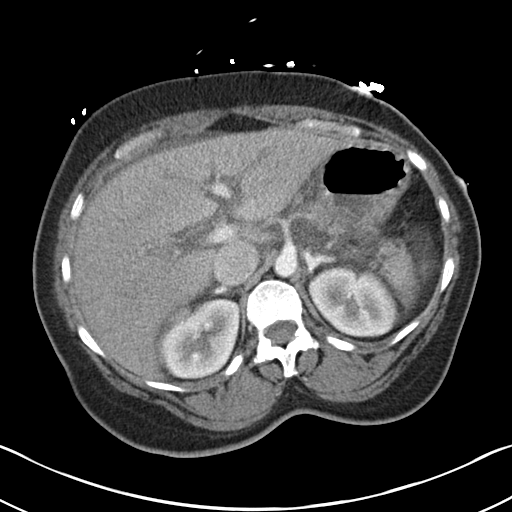
[im 68/93  bone]
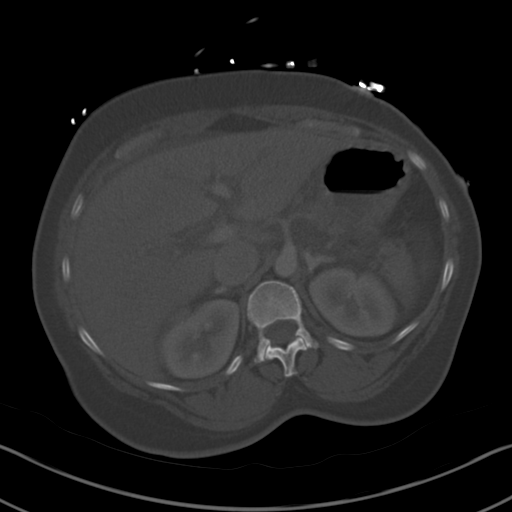
[im 80/93  soft-tissue]
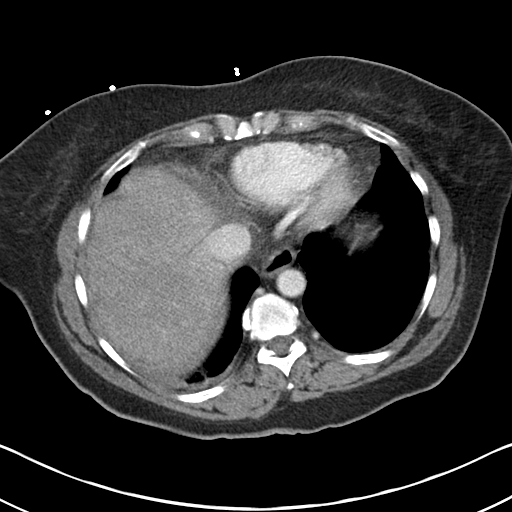
[im 86/93  soft-tissue]
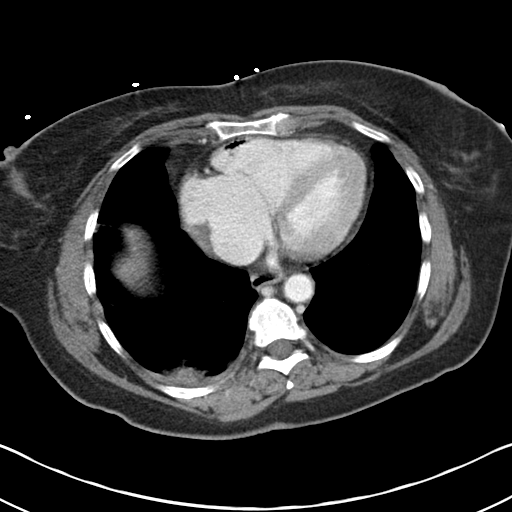

[Series 7: coronal st · coronal · 0.69mm/px · 3 of 100 slices shown]
[im 34/100  soft-tissue]
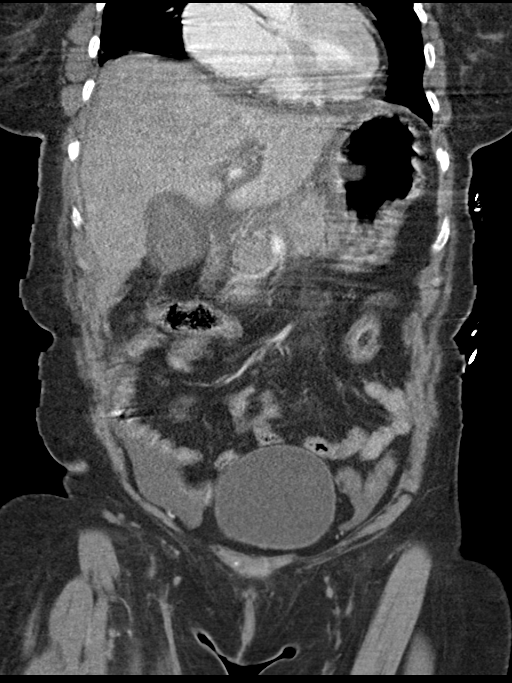
[im 45/100  soft-tissue]
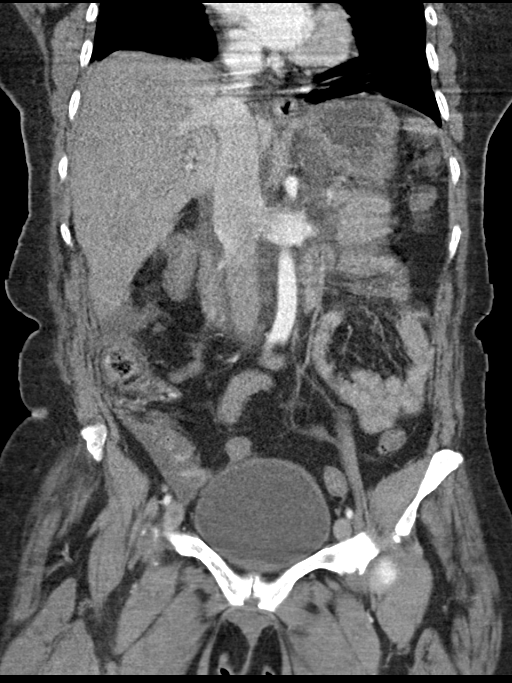
[im 56/100  soft-tissue]
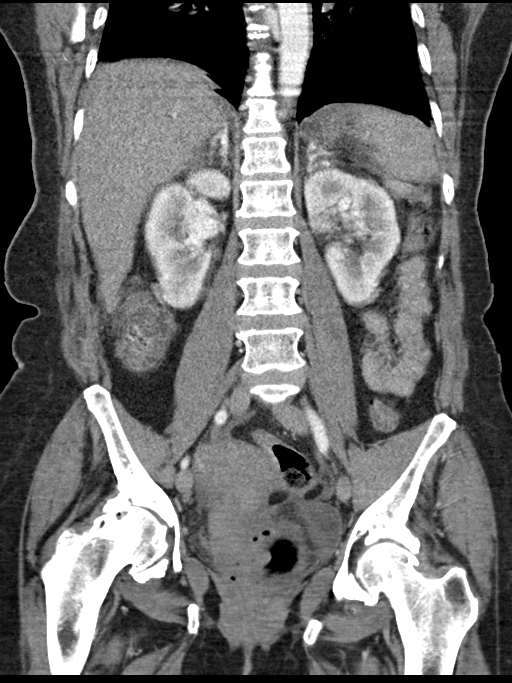

[14 of 46 positions shown; findings below may reference images not displayed]

RADIATION DOSE REDUCTION: This exam was performed according to the
departmental dose-optimization program which includes automated
exposure control, adjustment of the mA and/or kV according to
patient size and/or use of iterative reconstruction technique.

CONTRAST:  80mL OMNIPAQUE IOHEXOL 350 MG/ML SOLN
FINDINGS: CTA CHEST FINDINGS

Cardiovascular: Large bilateral pulmonary emboli are noted. RV/LV
ratio of 2.5 is noted suggesting right heart strain. No pericardial
effusion is noted.

Mediastinum/Nodes: Thyroid gland is unremarkable. Esophagus is
unremarkable. No mediastinal adenopathy is noted. 14 mm left
axillary lymph node is noted which is decreased compared to prior
exam.

Lungs/Pleura: No pneumothorax or pleural effusion is noted. Left
lung is clear. Mild right posterior basilar subsegmental atelectasis
is noted.

Musculoskeletal: No significant osseous abnormality is noted.
However, there is seen soft tissue gas in the cervical soft tissues
of unknown etiology.

Review of the MIP images confirms the above findings.

CT ABDOMEN and PELVIS FINDINGS

Hepatobiliary: No cholelithiasis is noted, but moderate gallbladder
wall thickening is noted concerning for cholecystitis or potentially
due to adjacent hepatocellular disease. Periportal low densities are
noted suggesting possible hepatitis. Stable 12 mm low density seen
in dome of left hepatic lobe most consistent with cyst.
Heterogeneous appearance of hepatic parenchyma is noted.

Pancreas: Mild amount of inflammatory stranding is noted around the
pancreas suggesting pancreatitis. No pseudocyst formation is noted.

Spleen: Normal in size without focal abnormality.

Adrenals/Urinary Tract: Adrenal glands are unremarkable. Kidneys are
normal, without renal calculi, focal lesion, or hydronephrosis.
Bladder is unremarkable.

Stomach/Bowel: The stomach appears normal. The appendix appears
normal. There is no evidence of bowel obstruction or inflammation.
Large amount of stool is noted in the rectum concerning for
impaction.

Vascular/Lymphatic: No significant vascular findings are present. No
enlarged abdominal or pelvic lymph nodes.

Reproductive: Uterus and bilateral adnexa are unremarkable.

Other: Small amount of free fluid or ascites is noted in the pelvis.
No hernia is noted.

Musculoskeletal: No acute or significant osseous findings.

Review of the MIP images confirms the above findings.
IMPRESSION: Large bilateral pulmonary emboli are noted. Positive for acute PE
with CT evidence of right heart strain (RV/LV Ratio = 2.5)
consistent with at least submassive (intermediate risk) PE. The
presence of right heart strain has been associated with an increased
risk of morbidity and mortality. Please refer to the "Code PE
Focused" order set in [REDACTED]. Critical Value/emergent results were
called by telephone at the time of interpretation on [DATE] at
[DATE] to provider BUSUMANE, RN, who verbally acknowledged
these results and stated that they were already aware of this
diagnosis.

14 mm left axillary lymph node is noted which is decreased compared
to prior exam.

Small foci of soft tissue gas are noted in the cervical soft tissues
anteriorly of uncertain etiology. Infection cannot be excluded.

Moderate gallbladder wall thickening is noted without
cholelithiasis. It is uncertain if this represents cholecystitis, or
potentially be secondary to adjacent hepatocellular disease. The
liver is somewhat heterogeneous in appearance with periportal
lucencies suggesting possible hepatitis.

Mild inflammatory stranding is noted around the pancreas suggesting
pancreatitis.

Small amount of ascites is noted in the pelvis.

Large amount of stool is noted in the rectum concerning for
impaction.

## 2022-03-22 IMAGING — DX DG CHEST 1V PORT
1 series · 1 of 1 positions shown · non-contrast
Comparison: Portable exam [3O] hours compared to [DATE]

CLINICAL DATA: Shortness of breath, increased weakness, had
chemotherapy on [REDACTED], history breast cancer

EXAM:
PORTABLE CHEST 1 VIEW

[chest ap]
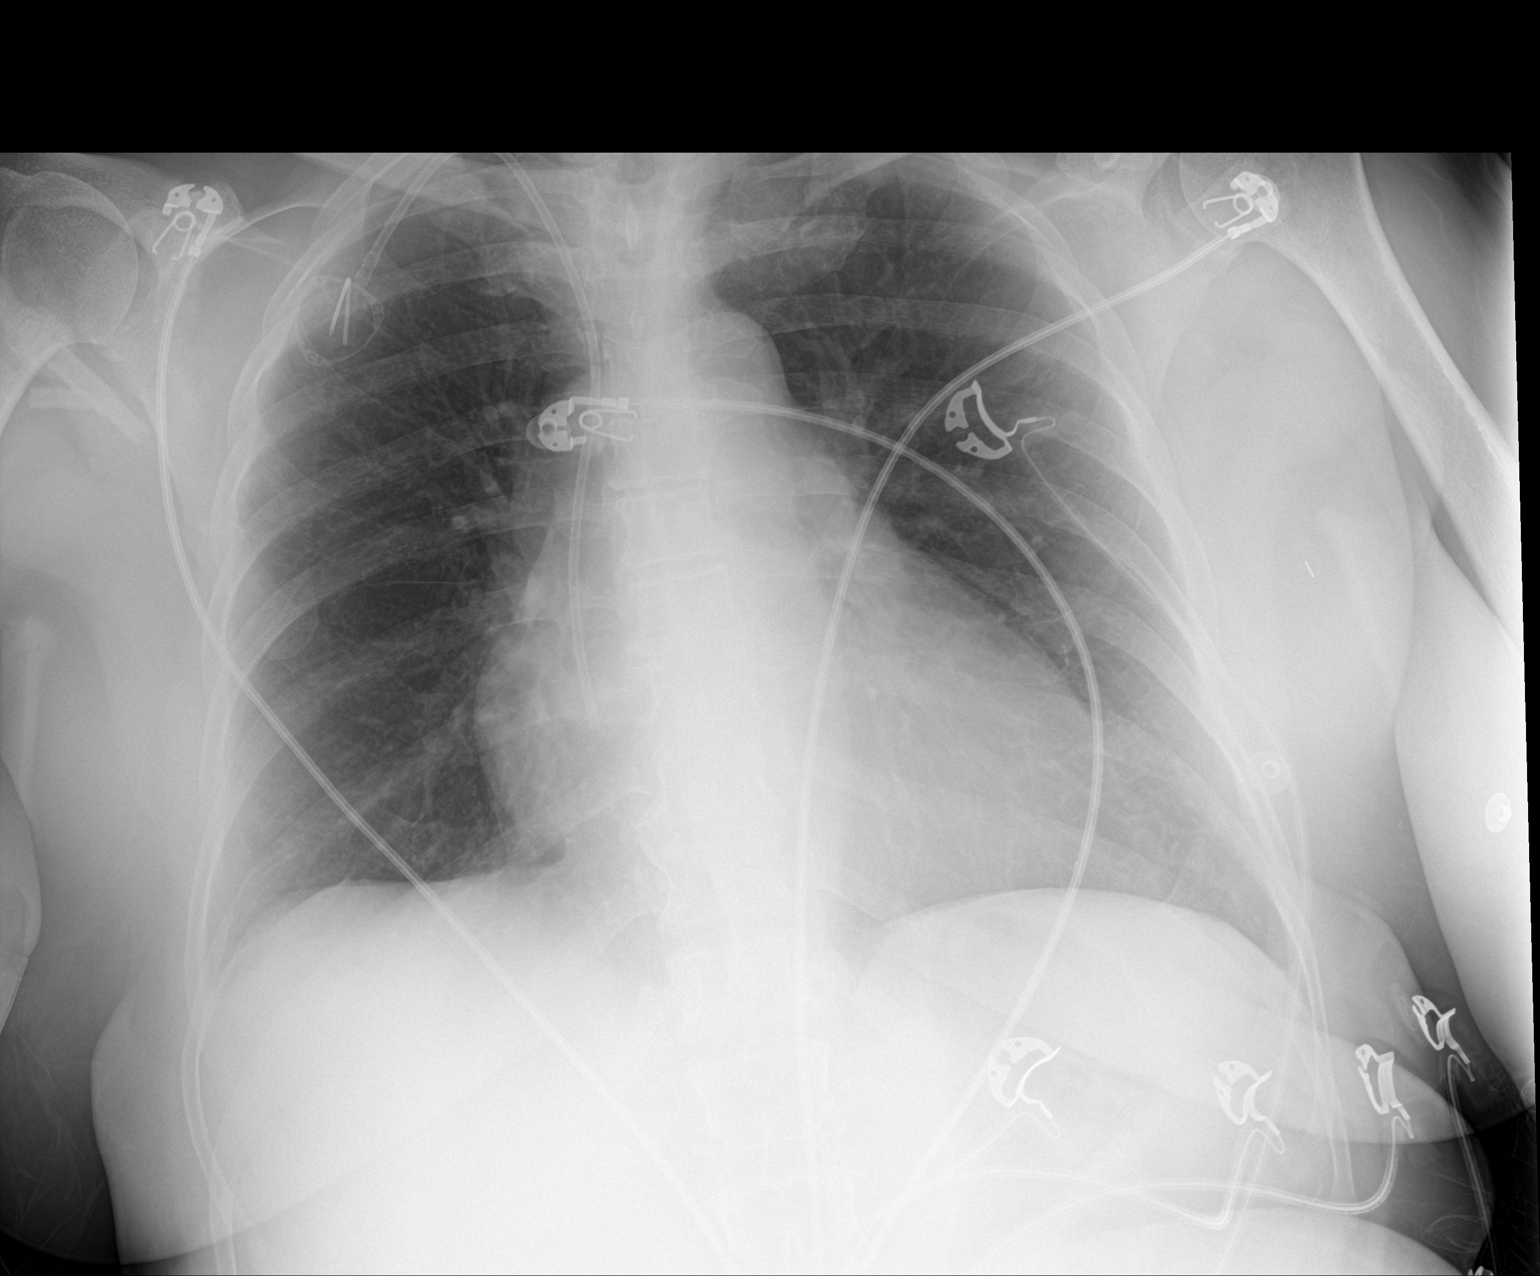

[1 of 1 positions shown; findings below may reference images not displayed]

FINDINGS: RIGHT jugular Port-A-Cath with tip projecting over RIGHT atrium.

Borderline enlargement of cardiac silhouette.

Mediastinal contours and pulmonary vascularity normal.

Lungs clear.

No infiltrate, pleural effusion, or pneumothorax.
IMPRESSION: No acute abnormalities.

## 2022-03-22 IMAGING — CT CT ANGIO CHEST
2 of 6 series · 16 of 46 positions shown · IV contrast (agent unspecified)
Comparison: [DATE].

CLINICAL DATA: Shortness of breath, acute generalized abdominal
pain.



[Series 3: thins · axial · 0.62mm/px · z∈[-178,+64]mm · 13 of 266 slices shown]
[im 12/266  lung]
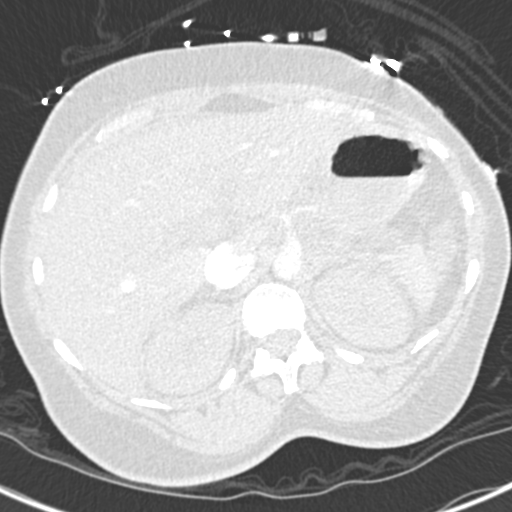
[im 35/266  soft-tissue]
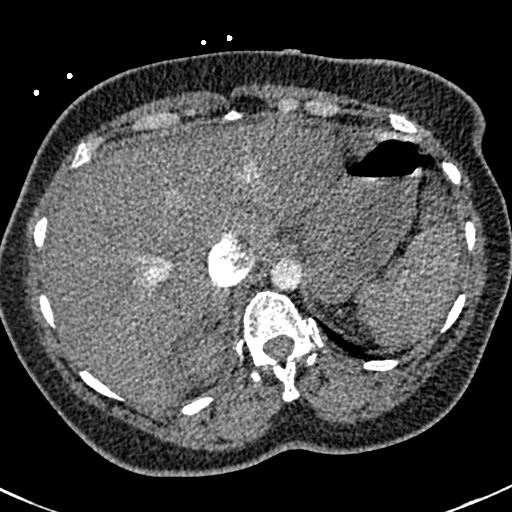
[im 58/266  lung]
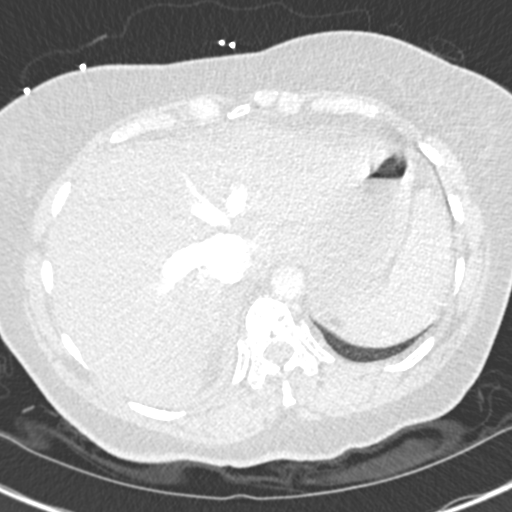
[im 70/266  soft-tissue]
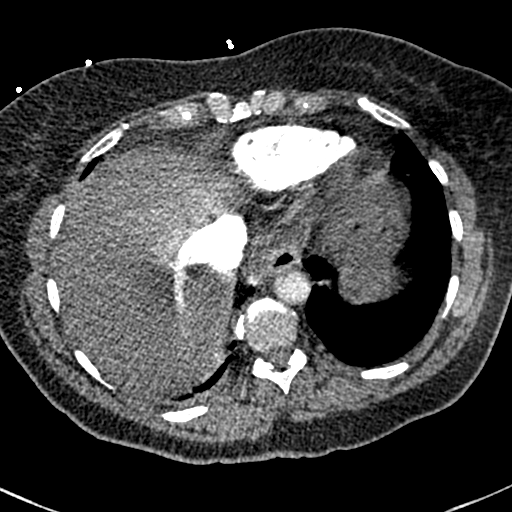
[im 93/266  lung]
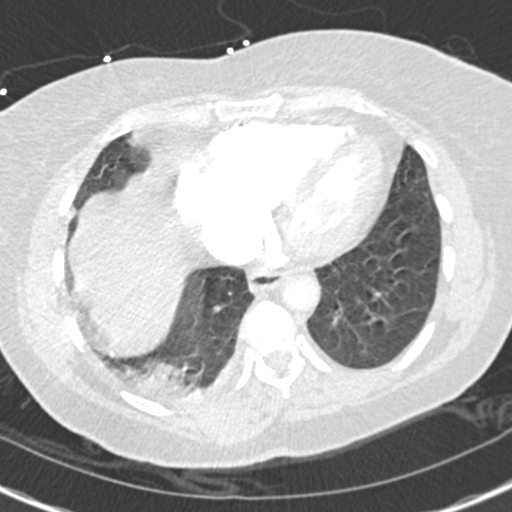
[im 116/266  soft-tissue]
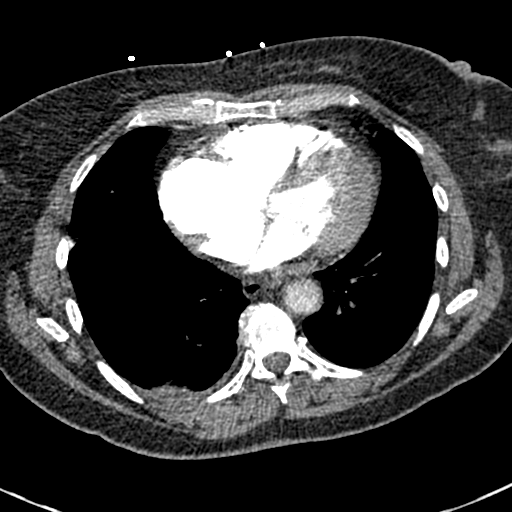
[im 139/266  lung]
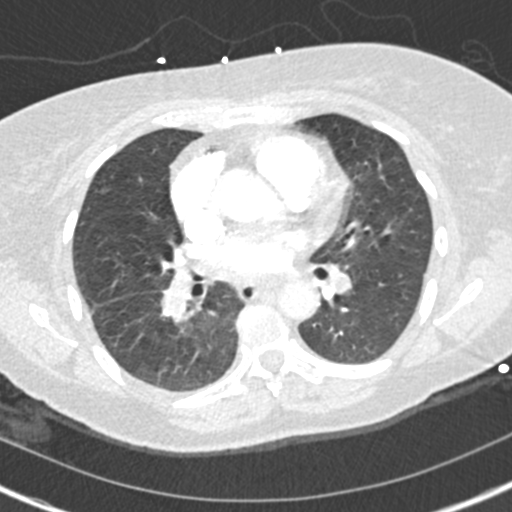
[im 150/266  soft-tissue]
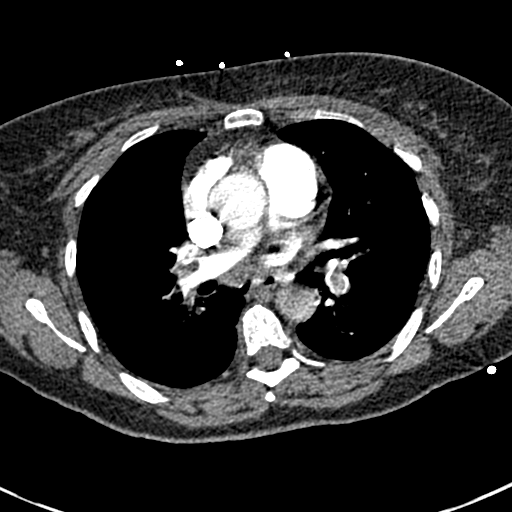
[im 173/266  lung]
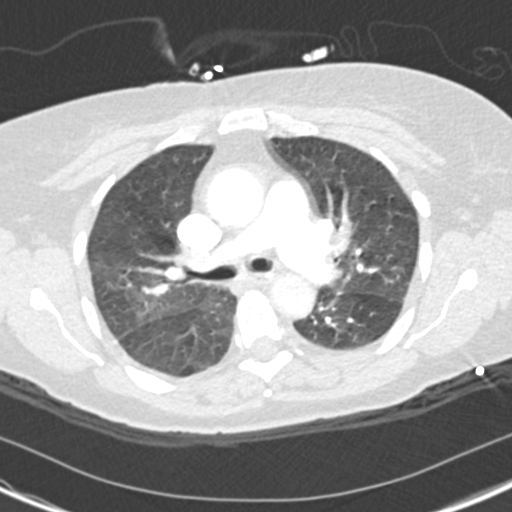
[im 196/266  soft-tissue]
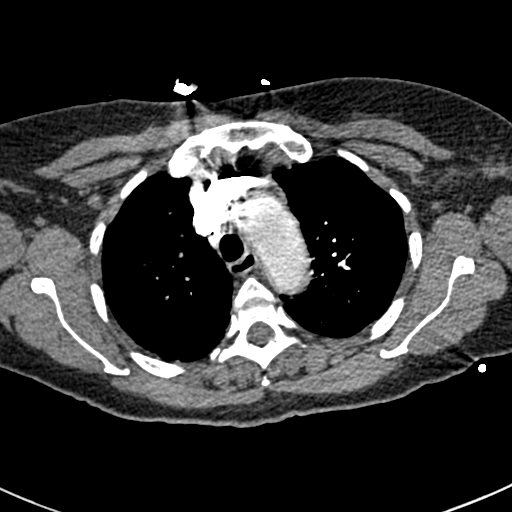
[im 208/266  lung]
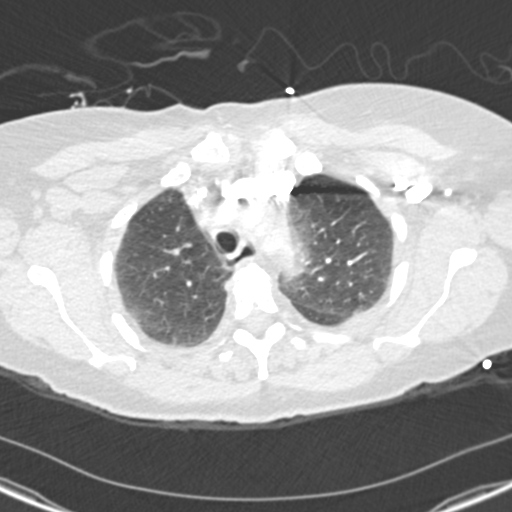
[im 231/266  soft-tissue]
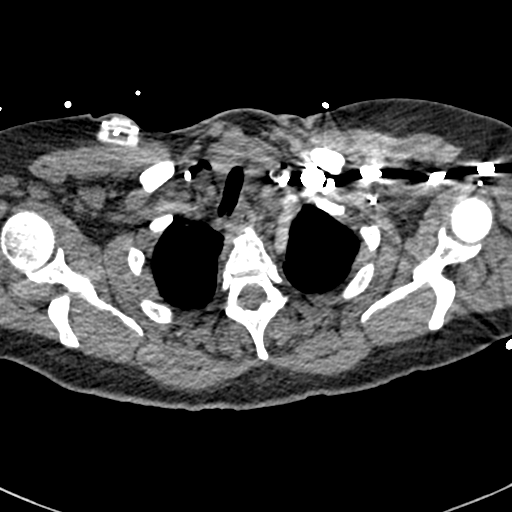
[im 254/266  lung]
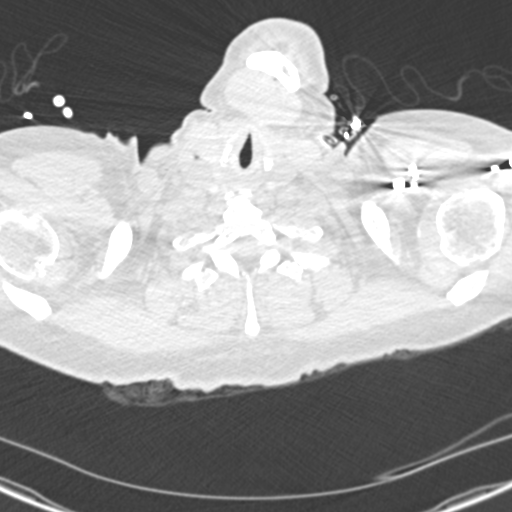

[Series 5: coronal mpr · coronal · 0.55mm/px · 3 of 130 slices shown]
[im 33/130  soft-tissue]
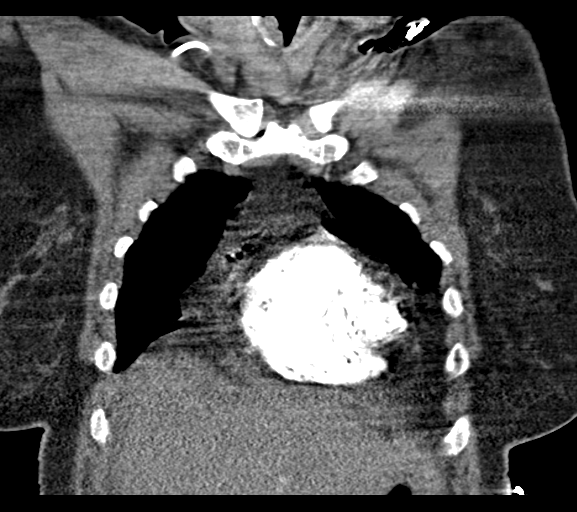
[im 65/130  soft-tissue]
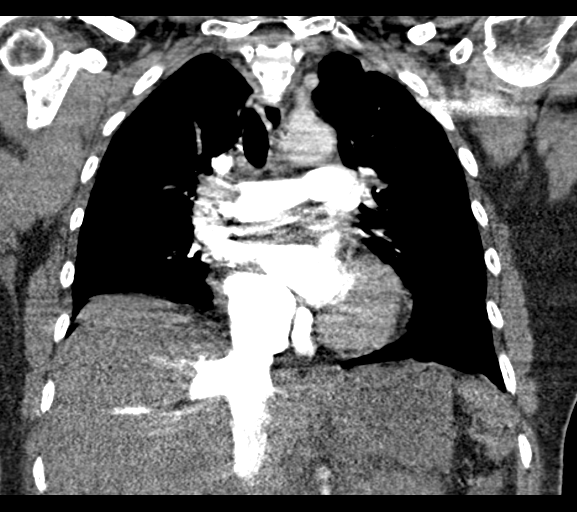
[im 97/130  soft-tissue]
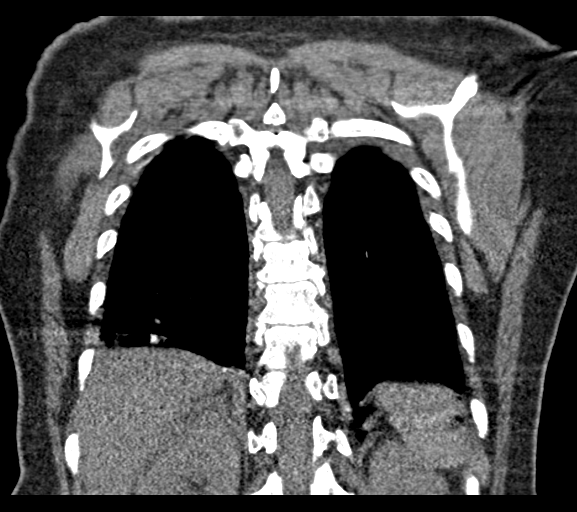

[16 of 46 positions shown; findings below may reference images not displayed]

RADIATION DOSE REDUCTION: This exam was performed according to the
departmental dose-optimization program which includes automated
exposure control, adjustment of the mA and/or kV according to
patient size and/or use of iterative reconstruction technique.

CONTRAST:  80mL OMNIPAQUE IOHEXOL 350 MG/ML SOLN
FINDINGS: CTA CHEST FINDINGS

Cardiovascular: Large bilateral pulmonary emboli are noted. RV/LV
ratio of 2.5 is noted suggesting right heart strain. No pericardial
effusion is noted.

Mediastinum/Nodes: Thyroid gland is unremarkable. Esophagus is
unremarkable. No mediastinal adenopathy is noted. 14 mm left
axillary lymph node is noted which is decreased compared to prior
exam.

Lungs/Pleura: No pneumothorax or pleural effusion is noted. Left
lung is clear. Mild right posterior basilar subsegmental atelectasis
is noted.

Musculoskeletal: No significant osseous abnormality is noted.
However, there is seen soft tissue gas in the cervical soft tissues
of unknown etiology.

Review of the MIP images confirms the above findings.

CT ABDOMEN and PELVIS FINDINGS

Hepatobiliary: No cholelithiasis is noted, but moderate gallbladder
wall thickening is noted concerning for cholecystitis or potentially
due to adjacent hepatocellular disease. Periportal low densities are
noted suggesting possible hepatitis. Stable 12 mm low density seen
in dome of left hepatic lobe most consistent with cyst.
Heterogeneous appearance of hepatic parenchyma is noted.

Pancreas: Mild amount of inflammatory stranding is noted around the
pancreas suggesting pancreatitis. No pseudocyst formation is noted.

Spleen: Normal in size without focal abnormality.

Adrenals/Urinary Tract: Adrenal glands are unremarkable. Kidneys are
normal, without renal calculi, focal lesion, or hydronephrosis.
Bladder is unremarkable.

Stomach/Bowel: The stomach appears normal. The appendix appears
normal. There is no evidence of bowel obstruction or inflammation.
Large amount of stool is noted in the rectum concerning for
impaction.

Vascular/Lymphatic: No significant vascular findings are present. No
enlarged abdominal or pelvic lymph nodes.

Reproductive: Uterus and bilateral adnexa are unremarkable.

Other: Small amount of free fluid or ascites is noted in the pelvis.
No hernia is noted.

Musculoskeletal: No acute or significant osseous findings.

Review of the MIP images confirms the above findings.
IMPRESSION: Large bilateral pulmonary emboli are noted. Positive for acute PE
with CT evidence of right heart strain (RV/LV Ratio = 2.5)
consistent with at least submassive (intermediate risk) PE. The
presence of right heart strain has been associated with an increased
risk of morbidity and mortality. Please refer to the "Code PE
Focused" order set in [REDACTED]. Critical Value/emergent results were
called by telephone at the time of interpretation on [DATE] at
[DATE] to provider BUSUMANE, RN, who verbally acknowledged
these results and stated that they were already aware of this
diagnosis.

14 mm left axillary lymph node is noted which is decreased compared
to prior exam.

Small foci of soft tissue gas are noted in the cervical soft tissues
anteriorly of uncertain etiology. Infection cannot be excluded.

Moderate gallbladder wall thickening is noted without
cholelithiasis. It is uncertain if this represents cholecystitis, or
potentially be secondary to adjacent hepatocellular disease. The
liver is somewhat heterogeneous in appearance with periportal
lucencies suggesting possible hepatitis.

Mild inflammatory stranding is noted around the pancreas suggesting
pancreatitis.

Small amount of ascites is noted in the pelvis.

Large amount of stool is noted in the rectum concerning for
impaction.

## 2022-03-22 MED ORDER — VANCOMYCIN HCL 1250 MG/250ML IV SOLN: 1250.0000 mg | INTRAVENOUS | Status: DC

## 2022-03-22 MED ORDER — CHLORHEXIDINE GLUCONATE CLOTH 2 % EX PADS
6.0000 | MEDICATED_PAD | Freq: Every day | CUTANEOUS | Status: DC
  Administered 2022-03-22 – 2022-03-25 (×4): 6 via TOPICAL

## 2022-03-22 MED ORDER — SODIUM CHLORIDE (PF) 0.9 % IJ SOLN
INTRAMUSCULAR | Status: AC
  Filled 2022-03-22: qty 50

## 2022-03-22 MED ORDER — SODIUM CHLORIDE 0.9 % IV BOLUS
1000.0000 mL | Freq: Once | INTRAVENOUS | Status: AC
  Administered 2022-03-22: 1000 mL via INTRAVENOUS

## 2022-03-22 MED ORDER — SODIUM BICARBONATE 8.4 % IV SOLN
50.0000 meq | Freq: Once | INTRAVENOUS | Status: AC
  Administered 2022-03-22: 50 meq via INTRAVENOUS
  Filled 2022-03-22: qty 50

## 2022-03-22 MED ORDER — NOREPINEPHRINE 4 MG/250ML-% IV SOLN
0.0000 ug/min | INTRAVENOUS | Status: DC
  Administered 2022-03-22: 2 ug/min via INTRAVENOUS
  Filled 2022-03-22 (×3): qty 250

## 2022-03-22 MED ORDER — CEFEPIME HCL 2 G IV SOLR
2.0000 g | Freq: Two times a day (BID) | INTRAVENOUS | Status: DC
  Administered 2022-03-23: 2 g via INTRAVENOUS
  Filled 2022-03-22: qty 12.5

## 2022-03-22 MED ORDER — SODIUM BICARBONATE 8.4 % IV SOLN
INTRAVENOUS | Status: DC
  Filled 2022-03-22: qty 1000

## 2022-03-22 MED ORDER — METRONIDAZOLE 500 MG/100ML IV SOLN
500.0000 mg | Freq: Once | INTRAVENOUS | Status: AC
Start: 2022-03-22 — End: 2022-03-22
  Administered 2022-03-22: 500 mg via INTRAVENOUS
  Filled 2022-03-22: qty 100

## 2022-03-22 MED ORDER — HEPARIN SODIUM (PORCINE) 5000 UNIT/ML IJ SOLN: 5000.0000 [IU] | Freq: Three times a day (TID) | INTRAMUSCULAR | Status: DC

## 2022-03-22 MED ORDER — HEPARIN (PORCINE) 25000 UT/250ML-% IV SOLN
900.0000 [IU]/h | INTRAVENOUS | Status: DC
  Administered 2022-03-22: 900 [IU]/h via INTRAVENOUS
  Filled 2022-03-22: qty 250

## 2022-03-22 MED ORDER — POLYETHYLENE GLYCOL 3350 17 G PO PACK: 17.0000 g | PACK | Freq: Every day | ORAL | Status: DC | PRN

## 2022-03-22 MED ORDER — SODIUM CHLORIDE 0.9 % IV BOLUS
500.0000 mL | Freq: Once | INTRAVENOUS | Status: AC
  Administered 2022-03-22: 500 mL via INTRAVENOUS

## 2022-03-22 MED ORDER — SODIUM CHLORIDE 0.9 % IV SOLN: 10.0000 mL/h | Freq: Once | INTRAVENOUS | Status: DC

## 2022-03-22 MED ORDER — LACTATED RINGERS IV SOLN: INTRAVENOUS | Status: DC

## 2022-03-22 MED ORDER — ORAL CARE MOUTH RINSE
15.0000 mL | Freq: Two times a day (BID) | OROMUCOSAL | Status: DC
  Administered 2022-03-22 – 2022-03-25 (×5): 15 mL via OROMUCOSAL

## 2022-03-22 MED ORDER — TENECTEPLASE 50 MG IV KIT
45.0000 mg | PACK | INTRAVENOUS | Status: AC
  Administered 2022-03-22: 45 mg via INTRAVENOUS
  Filled 2022-03-22: qty 10

## 2022-03-22 MED ORDER — VANCOMYCIN HCL IN DEXTROSE 1-5 GM/200ML-% IV SOLN
1000.0000 mg | Freq: Once | INTRAVENOUS | Status: DC
  Filled 2022-03-22: qty 200

## 2022-03-22 MED ORDER — VASOPRESSIN 20 UNITS/100 ML INFUSION FOR SHOCK
0.0000 [IU]/min | INTRAVENOUS | Status: DC
  Administered 2022-03-22: 0.03 [IU]/min via INTRAVENOUS
  Filled 2022-03-22: qty 100

## 2022-03-22 MED ORDER — SODIUM CHLORIDE 0.9 % IV SOLN
2.0000 g | Freq: Once | INTRAVENOUS | Status: AC
  Administered 2022-03-22: 2 g via INTRAVENOUS
  Filled 2022-03-22: qty 12.5

## 2022-03-22 MED ORDER — SODIUM BICARBONATE 8.4 % IV SOLN
INTRAVENOUS | Status: DC
  Filled 2022-03-22 (×2): qty 1000

## 2022-03-22 MED ORDER — LACTATED RINGERS IV BOLUS
500.0000 mL | Freq: Once | INTRAVENOUS | Status: DC
Start: 2022-03-22 — End: 2022-03-22

## 2022-03-22 MED ORDER — VANCOMYCIN HCL 1500 MG/300ML IV SOLN
1500.0000 mg | Freq: Once | INTRAVENOUS | Status: AC
  Administered 2022-03-22: 1500 mg via INTRAVENOUS
  Filled 2022-03-22: qty 300

## 2022-03-22 MED ORDER — SODIUM CHLORIDE 0.9 % IV SOLN: 2.0000 g | Freq: Three times a day (TID) | INTRAVENOUS | Status: DC

## 2022-03-22 MED ORDER — DOCUSATE SODIUM 100 MG PO CAPS: 100.0000 mg | ORAL_CAPSULE | Freq: Two times a day (BID) | ORAL | Status: DC | PRN

## 2022-03-22 MED ORDER — VANCOMYCIN VARIABLE DOSE PER UNSTABLE RENAL FUNCTION (PHARMACIST DOSING)
Status: DC
Start: 2022-03-22 — End: 2022-03-23

## 2022-03-22 MED ORDER — IOHEXOL 350 MG/ML SOLN
80.0000 mL | Freq: Once | INTRAVENOUS | Status: AC | PRN
  Administered 2022-03-22: 80 mL via INTRAVENOUS

## 2022-03-22 MED ORDER — ONDANSETRON HCL 4 MG/2ML IJ SOLN
4.0000 mg | Freq: Once | INTRAMUSCULAR | Status: AC
  Administered 2022-03-22: 4 mg via INTRAVENOUS
  Filled 2022-03-22: qty 2

## 2022-03-22 NOTE — ED Triage Notes (Signed)
Pt arriving via EMS from home with complaint of increased weakness and shortness of breath. Pt had chemo tx on Tuesday. Pt A&O x4 upon arrival.

## 2022-03-22 NOTE — Progress Notes (Signed)
Elink following sepsis bundle. °

## 2022-03-22 NOTE — Assessment & Plan Note (Signed)
dx'd CT scan 6/1 on acute hospital admission Had resultant obstructive shock  Got TNK at 1616 on 6/1

## 2022-03-22 NOTE — ED Notes (Addendum)
Lactic Acid 2.0 and Troponin 352 reported to Aletta Edouard, MD

## 2022-03-22 NOTE — Progress Notes (Signed)
ANTICOAGULATION CONSULT NOTE - Initial Consult  Pharmacy Consult for heparin s/p TNK Indication: pulmonary embolus  Allergies  Allergen Reactions   Prednisone Hypertension   Rosuvastatin     Other reaction(s): Myalgias (Muscle Pain)   Other     Had a rxn to cortisone knee injection - had elevated BP and HR   Robitussin (Alcohol Free) [Guaifenesin] Hives   Sulfa Antibiotics Hives    Patient Measurements: Height: '5\' 5"'$  (165.1 cm) Weight: 81.5 kg (179 lb 10.8 oz) IBW/kg (Calculated) : 57 Heparin Dosing Weight: 75 kg  Vital Signs: Temp: 97.9 F (36.6 C) (06/01 1116) Temp Source: Oral (06/01 1116) BP: 111/95 (06/01 1534) Pulse Rate: 91 (06/01 1438)  Labs: Recent Labs    03/20/22 0918 03/22/22 1151 03/22/22 1313 03/22/22 1526  HGB 8.9* 9.2*  --   --   HCT 26.9* 28.4*  --   --   PLT 243 248  --   --   CREATININE 0.69 0.78  --  1.41*  TROPONINIHS  --  352* 410*  --     Estimated Creatinine Clearance: 42.5 mL/min (A) (by C-G formula based on SCr of 1.41 mg/dL (H)).   Medical History: Past Medical History:  Diagnosis Date   Anxiety    Arthritis    right knee    GERD (gastroesophageal reflux disease)    Hyperlipidemia    Hypertension    Positive colorectal cancer screening using Cologuard test     Medications:  Scheduled:   [START ON 03/23/2022] heparin  5,000 Units Subcutaneous Q8H   sodium chloride (PF)       tenecteplase  45 mg Intravenous STAT    Assessment: 66 yo female presenting with shortness of breath and weakness. No anticoag PTA.  CT angio positive for bilateral large pulmonary emboli consistent with submassive PE. RHS noted (RV/LV ratio 2.5).  Patient received IV TNK '45mg'$  6/1 at 1616.    6/1: aPTT collected 30 minutes after TNK is 35 seconds. Ok to begin heparin drip without bolus now as aPTT is < 80 seconds.   Goal of Therapy:  Heparin 0.3-0.5 x 24 hours after TNK, then 0.3-0.7 Monitor platelets by anticoagulation protocol: Yes   Plan:  No  heparin bolus Begin heparin infusion at 900 units/hr Check heparin level 6 hours after infusion starts Monitor heparin level and CBC daily  Dimple Nanas, PharmD 03/22/2022 4:25 PM

## 2022-03-22 NOTE — ED Notes (Signed)
Dr. Melina Copa bedside and aware pts BP 60/45.

## 2022-03-22 NOTE — ED Notes (Signed)
Dr. Melina Copa bedside and aware pts BP 67/46.

## 2022-03-22 NOTE — Progress Notes (Signed)
A consult was received from an ED physician for vancomycin and cefepime per pharmacy dosing.  The patient's profile has been reviewed for ht/wt/allergies/indication/available labs.   A one time order has been placed for vancomycin 1500 mg IV x1 and cefepime 2g IV x1.     Further antibiotics/pharmacy consults should be ordered by admitting physician if indicated.                       Thank you,  Dimple Nanas, PharmD 03/22/2022 1:58 PM

## 2022-03-22 NOTE — Telephone Encounter (Signed)
Left message with follow-up appointment per 5/30 los.

## 2022-03-22 NOTE — ED Notes (Signed)
Pt having increased shortness of breath, resp rate approx 38-45. Pt placed on 2L O2 nasal cannula for comfort

## 2022-03-22 NOTE — H&P (Signed)
NAME:  Cassandra Brennan, MRN:  [id number], DOB:  12-08-1955, LOS: 0 ADMISSION DATE:  03/22/2022, CONSULTATION DATE:  6/1 REFERRING MD:  butler, CHIEF COMPLAINT:  shock    History of Present Illness:  66 yrear old female w/ hx as per below. Presented to [facility] ER w/ new onset weakness and fatigue X 24 hrs EMS arrival pulse thready and tachycardic w/ cold clammy skin. Reported isolated episode of vomiting   In ER HR 120s, sbp 60s, lactate 2-->4.4  CXR clear  Cultures sent. IVFs administered Empiric abx started.  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 Progressive over 24hrs. Lactate rising in ER from 2 to 4.4 in spite of 42m/kg IVFs. Cultures sent abx ordered (vanc and cefepime). Stat CT chest ordered.  Interim History / Subjective:  Progressive increase in WOB  Objective   Blood pressure (Abnormal) 72/40, pulse 88, temperature 97.9 F (36.6 C), temperature source Oral, resp. rate (Abnormal) 47, height '5\' 5"'$  (1.651 m), weight 81.5 kg, SpO2 97 %.        Intake/Output Summary (Last 24 hours) at 03/22/2022 1410 Last data filed at 03/22/2022 1359 Gross per 24 hour  Intake 1150.79 ml  Output no documentation  Net 1150.79 ml   Filed Weights   03/22/22 1123  Weight: 81.5 kg    Examination: General: acutely/critically ill appearing female. Currently on high dose NE infusion remaining hypotensive HENT: MM pale, sclera not icteric  Lungs: clear w/ shallow rapid respiratory effort Cardiovascular: tachycardic w/ no MRG Abdomen: soft not tender  Extremities: warm and dry  Neuro: awake, oriented x 3 but slow to respond. Generalized  weakness GU: due to void   Resolved Hospital Problem list     Assessment & Plan:   Principal Problem:   Shock ([place] Active Problems:   Lactic acidosis   Elevated troponin   Acute dyspnea   Acute metabolic encephalopathy   Chronic anemia   Malignant neoplasm of upper-inner quadrant of left breast in female, estrogen receptor positive (HCC)   GERD (gastroesophageal reflux disease)   Un-differentiated shock w/ lactic acidosis  -etiology not clear. Highest on ddx would be sepsis vs acute thrombo-embolic event Plan Completed 30 ml/kg PBW; will give another 5075mand cont IVFs Cont Norepi and adding Vasopressin MAP goal > 65 Stat ABG Serial lactates, serial PCT Cultures (blood and urine) CT angiogram (have alerted Pharmacy and discussed w/ family at bedside concern about life threatening PE) if CT angiogram + will give TNK and be ready w/ TNK should she arrest prior (family aware) Cont broad spec abx (vanc and cefepime)  STAT echo  Abnormal troponins Plan  12 lead Echo Cycle enzymes  Acute on chronic dyspnea CXR clear. Suspect acid base imbalance playing a roll but also consider PE Plan Cont supplemental oxygen  CT angiogram  Checking Venous ABG, if she ends-up needing intubation will push bicarb given concern for degree of respiratory compensation  Trying to avoid intubation if able given concern that loss of further pre-load might increase risk for further cardiopulmonary collapse if this is PE  Acute metabolic encephalopathy in setting of shock state Plan Holding sedating  meds Supportive care  Chronic anemia  Hgb has been holding in 8.9 to 9 region. No evidence of bleeding currently but could be dilutional . ER has ordered 1 uprbc in setting of shock Plan Ok to cont w/ 1 unit PRBC  Then cont to monitor   Hyperglycemia Plan SSI   Best Practice (right click and "Reselect all SmartList Selections" daily)   Diet/type: NPO DVT prophylaxis: prophylactic  heparin  GI prophylaxis: N/A Lines: Central line and yes and it is still needed Foley:  N/A Code Status:  full code Last date of multidisciplinary goals of care discussion [pending ]  Labs   CBC: Recent Labs  Lab 03/20/22 0918 03/22/22 1151  WBC 4.0 5.2  NEUTROABS 3.1 4.6  HGB 8.9* 9.2*  HCT 26.9* 28.4*  MCV 84.3 86.9  PLT 243 294    Basic Metabolic Panel: Recent Labs  Lab 03/20/22 0918 03/22/22 1151  NA 139 142  K 3.4* 3.9  CL 107 111  CO2 26 22  GLUCOSE 131* 140*  BUN 13 15  CREATININE 0.69 0.78  CALCIUM 9.4 8.8*  MG  --  1.9   GFR: Estimated Creatinine Clearance: 74.9 mL/min (by C-G formula based on SCr of 0.78 mg/dL). Recent Labs  Lab 03/20/22 0918 03/22/22 1151 03/22/22 1313  WBC 4.0 5.2  --   LATICACIDVEN  --  2.0* 4.4*    Liver Function Tests: Recent Labs  Lab 03/20/22 0918 03/22/22 1151  AST 16 27  ALT 23 32  ALKPHOS 84 87  BILITOT 0.6 1.1  PROT 6.9 6.7  ALBUMIN 4.0 3.6   Recent Labs  Lab 03/22/22 1151  LIPASE 23   No results for input(s): AMMONIA in the last 168 hours.  ABG No results found for: PHART, PCO2ART, PO2ART, HCO3, TCO2, ACIDBASEDEF, O2SAT   Coagulation Profile: No results for input(s): INR, PROTIME in the last 168 hours.  Cardiac Enzymes: No results for input(s): CKTOTAL, CKMB, CKMBINDEX, TROPONINI in the last 168 hours.  HbA1C: Hgb A1c MFr Bld  Date/Time Value Ref Range Status  03/15/2020 03:50 AM 5.4 <5.7 % of total Hgb Final    Comment:    For the purpose of screening for the presence of diabetes: . <5.7%       Consistent with the absence of diabetes 5.7-6.4%    Consistent with increased risk for diabetes             (prediabetes) > or =6.5%  Consistent with diabetes . This assay result is consistent with a decreased risk of diabetes. . Currently, no consensus exists regarding use of hemoglobin A1c for diagnosis of diabetes in children. . According to American Diabetes Association (ADA) guidelines,  hemoglobin A1c <7.0% represents optimal control in non-pregnant diabetic patients. Different metrics may apply to specific patient populations.  Standards of Medical Care in Diabetes(ADA). .     CBG: No results for input(s): GLUCAP in the last 168 hours.  Review of Systems:   See above   Past Medical History:  She,  has a past medical history of Anxiety, Arthritis, GERD (gastroesophageal reflux disease), Hyperlipidemia, Hypertension, and Positive colorectal cancer screening using Cologuard test.   Surgical History:   Past Surgical History:  Procedure Laterality Date   BREAST CYST EXCISION Right    pt stated in [place]     other GI testing     unsure but had to drink a chalky drink    PORTACATH PLACEMENT N/A 11/27/2021   Procedure:  INSERTION PORT-A-CATH;  Surgeon: Erroll Luna, MD;  Location: [facility]S;  Service: General;  Laterality: N/A;   TONSILLECTOMY     TUBAL LIGATION       Social History:   reports that she has never smoked. She has never used smokeless tobacco. She reports that she does not drink alcohol and does not use drugs.   Family History:  Her family history includes Breast cancer in her cousin; Colon cancer in her cousin and maternal uncle; Colon polyps in her sister, sister, and sister; Gastric cancer in her cousin; Kidney cancer in her nephew; Throat cancer in her maternal uncle. There is no history of Esophageal cancer, Rectal cancer, or Stomach cancer.   Allergies Allergies  Allergen Reactions   Prednisone Hypertension   Rosuvastatin     Other reaction(s): Myalgias (Muscle Pain)   Other     Had a rxn to cortisone knee injection - had elevated BP and HR   Robitussin (Alcohol Free) [Guaifenesin] Hives   Sulfa Antibiotics Hives     Home Medications  Prior to Admission medications   Medication Sig Start Date End Date Taking? Authorizing Provider  acetaminophen (TYLENOL) 500 MG tablet Take 500 mg by mouth every 8 (eight) hours as needed  for moderate pain.    [provider]  lidocaine-prilocaine (EMLA) cream Apply to affected area once 11/16/21   Truitt Merle, MD  metoprolol succinate (TOPROL-XL) 100 MG 24 hr tablet Take 100 mg by mouth daily. Take with or immediately following a meal.    [provider]  ondansetron (ZOFRAN) 8 MG tablet Take 1 tablet (8 mg total) by mouth 2 (two) times daily as needed. Start on the third day after chemotherapy. 11/08/21   Truitt Merle, MD  potassium chloride (KLOR-CON M) 10 MEQ tablet Take 1 tablet (10 mEq total) by mouth daily. 03/20/22   [name], NP  pravastatin (PRAVACHOL) 20 MG tablet Take 1 tablet (20 mg total) by mouth daily. 03/15/20   Cheyenne Adas, NP  prochlorperazine (COMPAZINE) 10 MG tablet Take 1 tablet (10 mg total) by mouth every 6 (six) hours as needed (Nausea or vomiting). 11/08/21   Truitt Merle, MD     Critical care time: 70 min     Erick Colace ACNP-BC [facility] Pager # [phone] OR # [phone] if no answer

## 2022-03-22 NOTE — Progress Notes (Addendum)
Pharmacy Antibiotic Note  Cassandra Brennan is a 66 y.o. female admitted on 03/22/2022 with sepsis of unknown source.  Endorses shortness of breath and increased weakness.  Patient recently received chemotherapy 3 days prior to admission for history of breast cancer.  Pharmacy has been consulted for vancomycin and cefepime dosing.  On admit: WBC 5.2, Scr 0.78, LA 2 >> 4.4, afebrile  PM UPDATE: repeat labs with worsened renal function (Scr 0.78 >> 1.4) likely due to acute illness. Will adjust antibiotics accordingly.   Plan: Vancomycin '1500mg'$  IV x1 in ED Vancomycin random level 6/2 with AM labs, re-dose vancomycin when level < 20 Cefepime 2g IV q12 hours F/u culture data, renal function, ability to narrow antibiotics  Height: '5\' 5"'$  (165.1 cm) Weight: 81.5 kg (179 lb 10.8 oz) IBW/kg (Calculated) : 57  Temp (24hrs), Avg:97.9 F (36.6 C), Min:97.9 F (36.6 C), Max:97.9 F (36.6 C)  Recent Labs  Lab 03/20/22 0918 03/22/22 1151 03/22/22 1313  WBC 4.0 5.2  --   CREATININE 0.69 0.78  --   LATICACIDVEN  --  2.0* 4.4*    Estimated Creatinine Clearance: 74.9 mL/min (by C-G formula based on SCr of 0.78 mg/dL).    Allergies  Allergen Reactions   Prednisone Hypertension   Rosuvastatin     Other reaction(s): Myalgias (Muscle Pain)   Other     Had a rxn to cortisone knee injection - had elevated BP and HR   Robitussin (Alcohol Free) [Guaifenesin] Hives   Sulfa Antibiotics Hives    Antimicrobials this admission: Vancomycin 6/1 >>  Cefepime 6/1 >>   Dose adjustments this admission:   Microbiology results: 6/1 BCx:  6/1 Ucx:   Thank you for allowing pharmacy to be a part of this patient's care.  Dimple Nanas, PharmD 03/22/2022 2:50 PM

## 2022-03-22 NOTE — ED Provider Notes (Signed)
[facility] Provider Note   CSN: [id number] Arrival date & time: 03/22/22  1103     History  No chief complaint on file.   Cassandra Brennan is a 66 y.o. female.  She has a history of breast cancer and is active with chemotherapy, follows with Dr. Earleen Newport.  Just had a dose of chemotherapy 3 days ago.  Since yesterday increased fatigue generalized weakness.  EMS found blood pressure to be low with thready pulses, cold and clammy.  Given IV fluids with improvement in her symptoms.  She endorses general fatigue and shortness of breath dyspnea on exertion.  Vomited once.  No diarrhea no chest pain.  Chronic cough minimally productive no fevers or chills.  She said she supposed to get an echo tomorrow for persistent tachycardia.  The history is provided by the patient and the EMS personnel.  Weakness Severity:  Severe Onset quality:  Gradual Duration:  1 day Timing:  Constant Progression:  Unchanged Chronicity:  New Relieved by:  Nothing Worsened by:  Activity Ineffective treatments:  None tried Associated symptoms: cough, nausea, shortness of breath and vomiting   Associated symptoms: no abdominal pain, no chest pain, no diarrhea, no dysuria, no falls, no fever, no headaches and no loss of consciousness   Shortness of breath:    Severity:  Moderate   Onset quality:  Gradual   Duration:  1 day   Progression:  Unchanged     Home Medications Prior to Admission medications   Medication Sig Start Date End Date Taking? Authorizing Provider  acetaminophen (TYLENOL) 500 MG tablet Take 500 mg by mouth every 8 (eight) hours as needed for moderate pain.    [provider]  lidocaine-prilocaine (EMLA) cream Apply to affected area once 11/16/21   Truitt Merle, MD  metoprolol succinate (TOPROL-XL) 100 MG 24 hr tablet Take 100 mg by mouth daily. Take with or immediately following a meal.    [provider]  ondansetron (ZOFRAN) 8 MG tablet Take 1  tablet (8 mg total) by mouth 2 (two) times daily as needed. Start on the third day after chemotherapy. 11/08/21   Truitt Merle, MD  potassium chloride (KLOR-CON M) 10 MEQ tablet Take 1 tablet (10 mEq total) by mouth daily. 03/20/22   [name], NP  pravastatin (PRAVACHOL) 20 MG tablet Take 1 tablet (20 mg total) by mouth daily. 03/15/20   Cheyenne Adas, NP  prochlorperazine (COMPAZINE) 10 MG tablet Take 1 tablet (10 mg total) by mouth every 6 (six) hours as needed (Nausea or vomiting). 11/08/21   Truitt Merle, MD      Allergies    Prednisone, Rosuvastatin, Other, Robitussin (alcohol free) [guaifenesin], and Sulfa antibiotics    Review of Systems   Review of Systems  Constitutional:  Positive for fatigue. Negative for fever.  HENT:  Negative for sore throat.   Eyes:  Negative for visual disturbance.  Respiratory:  Positive for cough and shortness of breath.   Cardiovascular:  Negative for chest pain.  Gastrointestinal:  Positive for nausea and vomiting. Negative for abdominal pain and diarrhea.  Genitourinary:  Negative for dysuria.  Musculoskeletal:  Negative for falls and neck pain.  Skin:  Negative for rash.  Neurological:  Positive for weakness. Negative for loss of consciousness and headaches.   Physical Exam Updated Vital Signs BP 120/85 (BP Location: Left Arm)   Pulse (!) 128   Temp 97.9 F (36.6 C) (Oral)   Resp 20   SpO2 98%  Physical Exam Vitals and nursing note reviewed.  Constitutional:      General: She is not in acute distress.    Appearance: Normal appearance. She is well-developed.  HENT:     Head: Normocephalic and atraumatic.  Eyes:     Conjunctiva/sclera: Conjunctivae normal.  Cardiovascular:     Rate and Rhythm: Regular rhythm. Tachycardia present.     Heart sounds: No murmur heard. Pulmonary:     Effort: Pulmonary effort is normal. No respiratory distress.     Breath sounds: Normal breath sounds.  Abdominal:     Palpations: Abdomen is soft.      Tenderness: There is no abdominal tenderness. There is no guarding or rebound.  Musculoskeletal:        General: Normal range of motion.     Cervical back: Neck supple.     Right lower leg: No edema.     Left lower leg: No edema.  Skin:    General: Skin is warm and dry.     Capillary Refill: Capillary refill takes less than 2 seconds.  Neurological:     General: No focal deficit present.     Mental Status: She is alert and oriented to person, place, and time.     Sensory: No sensory deficit.     Motor: No weakness.    ED Results / Procedures / Treatments   Labs (all labs ordered are listed, but only abnormal results are displayed) Labs Reviewed  COMPREHENSIVE METABOLIC PANEL - Abnormal; Notable for the following components:      Result Value   Glucose, Bld 140 (*)    Calcium 8.8 (*)    All other components within normal limits  LACTIC ACID, PLASMA - Abnormal; Notable for the following components:   Lactic Acid, Venous 2.0 (*)    All other components within normal limits  LACTIC ACID, PLASMA - Abnormal; Notable for the following components:   Lactic Acid, Venous 4.4 (*)    All other components within normal limits  CBC WITH DIFFERENTIAL/PLATELET - Abnormal; Notable for the following components:   RBC 3.27 (*)    Hemoglobin 9.2 (*)    HCT 28.4 (*)    RDW 17.8 (*)    Lymphs Abs 0.3 (*)    All other components within normal limits  BLOOD GAS, VENOUS - Abnormal; Notable for the following components:   pH, Ven 7.09 (*)    pO2, Ven <31 (*)    Bicarbonate 15.2 (*)    Acid-base deficit 14.6 (*)    All other components within normal limits  CBC - Abnormal; Notable for the following components:   RBC 3.23 (*)    Hemoglobin 9.0 (*)    HCT 30.2 (*)    MCHC 29.8 (*)    RDW 18.1 (*)    nRBC 1.0 (*)    All other components within normal limits  LACTIC ACID, PLASMA - Abnormal; Notable for the following components:   Lactic Acid, Venous 8.2 (*)    All other components within  normal limits  COMPREHENSIVE METABOLIC PANEL - Abnormal; Notable for the following components:   Chloride 114 (*)    CO2 14 (*)    Glucose, Bld 257 (*)    Creatinine, Ser 1.41 (*)    Calcium 6.9 (*)    Total Protein 4.9 (*)    Albumin 2.5 (*)    AST 73 (*)    ALT 90 (*)    GFR, Estimated 42 (*)    All other  components within normal limits  CBC - Abnormal; Notable for the following components:   RBC 2.83 (*)    Hemoglobin 7.8 (*)    HCT 25.9 (*)    RDW 18.3 (*)    Platelets 148 (*)    nRBC 1.2 (*)    All other components within normal limits  PROTIME-INR - Abnormal; Notable for the following components:   Prothrombin Time 18.6 (*)    INR 1.6 (*)    All other components within normal limits  TROPONIN I (HIGH SENSITIVITY) - Abnormal; Notable for the following components:   Troponin I (High Sensitivity) 352 (*)    All other components within normal limits  TROPONIN I (HIGH SENSITIVITY) - Abnormal; Notable for the following components:   Troponin I (High Sensitivity) 410 (*)    All other components within normal limits  SARS CORONAVIRUS 2 BY RT PCR  CULTURE, BLOOD (ROUTINE X 2)  CULTURE, BLOOD (ROUTINE X 2)  URINE CULTURE  LIPASE, BLOOD  MAGNESIUM  BRAIN NATRIURETIC PEPTIDE  APTT  URINALYSIS, ROUTINE W REFLEX MICROSCOPIC  PROCALCITONIN  HIV ANTIBODY (ROUTINE TESTING W REFLEX)  LACTIC ACID, PLASMA  STREP PNEUMONIAE URINARY ANTIGEN  URINALYSIS, ROUTINE W REFLEX MICROSCOPIC  CBC  BASIC METABOLIC PANEL  MAGNESIUM  PHOSPHORUS  PROCALCITONIN  TYPE AND SCREEN  PREPARE RBC (CROSSMATCH)    EKG EKG Interpretation  Date/Time:  Thursday March 22 2022 11:15:30 EDT Ventricular Rate:  127 PR Interval:  146 QRS Duration: 76 QT Interval:  321 QTC Calculation: 467 R Axis:   62 Text Interpretation: Sinus tachycardia Low voltage, precordial leads Borderline repolarization abnormality nonspecific ST depression new from prior 2/23 Confirmed by Aletta Edouard [phone]) on 03/22/2022  11:24:13 AM  Radiology CT Angio Chest PE W/Cm &/Or Wo Cm  Result Date: 03/22/2022 CLINICAL DATA:  Shortness of breath, acute generalized abdominal pain. EXAM: CT ANGIOGRAPHY CHEST CT ABDOMEN AND PELVIS WITH CONTRAST TECHNIQUE: Multidetector CT imaging of the chest was performed using the standard protocol during bolus administration of intravenous contrast. Multiplanar CT image reconstructions and MIPs were obtained to evaluate the vascular anatomy. Multidetector CT imaging of the abdomen and pelvis was performed using the standard protocol during bolus administration of intravenous contrast. RADIATION DOSE REDUCTION: This exam was performed according to the departmental dose-optimization program which includes automated exposure control, adjustment of the mA and/or kV according to patient size and/or use of iterative reconstruction technique. CONTRAST:  47m OMNIPAQUE IOHEXOL 350 MG/ML SOLN COMPARISON:  November 23, 2021. FINDINGS: CTA CHEST FINDINGS Cardiovascular: Large bilateral pulmonary emboli are noted. RV/LV ratio of 2.5 is noted suggesting right heart strain. No pericardial effusion is noted. Mediastinum/Nodes: Thyroid gland is unremarkable. Esophagus is unremarkable. No mediastinal adenopathy is noted. 14 mm left axillary lymph node is noted which is decreased compared to prior exam. Lungs/Pleura: No pneumothorax or pleural effusion is noted. Left lung is clear. Mild right posterior basilar subsegmental atelectasis is noted. Musculoskeletal: No significant osseous abnormality is noted. However, there is seen soft tissue gas in the cervical soft tissues of unknown etiology. Review of the MIP images confirms the above findings. CT ABDOMEN and PELVIS FINDINGS Hepatobiliary: No cholelithiasis is noted, but moderate gallbladder wall thickening is noted concerning for cholecystitis or potentially due to adjacent hepatocellular disease. Periportal low densities are noted suggesting possible hepatitis. Stable  12 mm low density seen in dome of left hepatic lobe most consistent with cyst. Heterogeneous appearance of hepatic parenchyma is noted. Pancreas: Mild amount of inflammatory stranding is noted around  the pancreas suggesting pancreatitis. No pseudocyst formation is noted. Spleen: Normal in size without focal abnormality. Adrenals/Urinary Tract: Adrenal glands are unremarkable. Kidneys are normal, without renal calculi, focal lesion, or hydronephrosis. Bladder is unremarkable. Stomach/Bowel: The stomach appears normal. The appendix appears normal. There is no evidence of bowel obstruction or inflammation. Large amount of stool is noted in the rectum concerning for impaction. Vascular/Lymphatic: No significant vascular findings are present. No enlarged abdominal or pelvic lymph nodes. Reproductive: Uterus and bilateral adnexa are unremarkable. Other: Small amount of free fluid or ascites is noted in the pelvis. No hernia is noted. Musculoskeletal: No acute or significant osseous findings. Review of the MIP images confirms the above findings. IMPRESSION: Large bilateral pulmonary emboli are noted. Positive for acute PE with CT evidence of right heart strain (RV/LV Ratio = 2.5) consistent with at least submassive (intermediate risk) PE. The presence of right heart strain has been associated with an increased risk of morbidity and mortality. Please refer to the "Code PE Focused" order set in EPIC. Critical Value/emergent results were called by telephone at the time of interpretation on 03/22/2022 at 4:23 pm to provider Farrell Ours, RN, who verbally acknowledged these results and stated that they were already aware of this diagnosis. 14 mm left axillary lymph node is noted which is decreased compared to prior exam. Small foci of soft tissue gas are noted in the cervical soft tissues anteriorly of uncertain etiology. Infection cannot be excluded. Moderate gallbladder wall thickening is noted without cholelithiasis. It is  uncertain if this represents cholecystitis, or potentially be secondary to adjacent hepatocellular disease. The liver is somewhat heterogeneous in appearance with periportal lucencies suggesting possible hepatitis. Mild inflammatory stranding is noted around the pancreas suggesting pancreatitis. Small amount of ascites is noted in the pelvis. Large amount of stool is noted in the rectum concerning for impaction. Electronically Signed   By: Marijo Conception M.D.   On: 03/22/2022 16:24   CT Abdomen Pelvis W Contrast  Result Date: 03/22/2022 CLINICAL DATA:  Shortness of breath, acute generalized abdominal pain. EXAM: CT ANGIOGRAPHY CHEST CT ABDOMEN AND PELVIS WITH CONTRAST TECHNIQUE: Multidetector CT imaging of the chest was performed using the standard protocol during bolus administration of intravenous contrast. Multiplanar CT image reconstructions and MIPs were obtained to evaluate the vascular anatomy. Multidetector CT imaging of the abdomen and pelvis was performed using the standard protocol during bolus administration of intravenous contrast. RADIATION DOSE REDUCTION: This exam was performed according to the departmental dose-optimization program which includes automated exposure control, adjustment of the mA and/or kV according to patient size and/or use of iterative reconstruction technique. CONTRAST:  76m OMNIPAQUE IOHEXOL 350 MG/ML SOLN COMPARISON:  November 23, 2021. FINDINGS: CTA CHEST FINDINGS Cardiovascular: Large bilateral pulmonary emboli are noted. RV/LV ratio of 2.5 is noted suggesting right heart strain. No pericardial effusion is noted. Mediastinum/Nodes: Thyroid gland is unremarkable. Esophagus is unremarkable. No mediastinal adenopathy is noted. 14 mm left axillary lymph node is noted which is decreased compared to prior exam. Lungs/Pleura: No pneumothorax or pleural effusion is noted. Left lung is clear. Mild right posterior basilar subsegmental atelectasis is noted. Musculoskeletal: No  significant osseous abnormality is noted. However, there is seen soft tissue gas in the cervical soft tissues of unknown etiology. Review of the MIP images confirms the above findings. CT ABDOMEN and PELVIS FINDINGS Hepatobiliary: No cholelithiasis is noted, but moderate gallbladder wall thickening is noted concerning for cholecystitis or potentially due to adjacent hepatocellular disease. Periportal low densities are  noted suggesting possible hepatitis. Stable 12 mm low density seen in dome of left hepatic lobe most consistent with cyst. Heterogeneous appearance of hepatic parenchyma is noted. Pancreas: Mild amount of inflammatory stranding is noted around the pancreas suggesting pancreatitis. No pseudocyst formation is noted. Spleen: Normal in size without focal abnormality. Adrenals/Urinary Tract: Adrenal glands are unremarkable. Kidneys are normal, without renal calculi, focal lesion, or hydronephrosis. Bladder is unremarkable. Stomach/Bowel: The stomach appears normal. The appendix appears normal. There is no evidence of bowel obstruction or inflammation. Large amount of stool is noted in the rectum concerning for impaction. Vascular/Lymphatic: No significant vascular findings are present. No enlarged abdominal or pelvic lymph nodes. Reproductive: Uterus and bilateral adnexa are unremarkable. Other: Small amount of free fluid or ascites is noted in the pelvis. No hernia is noted. Musculoskeletal: No acute or significant osseous findings. Review of the MIP images confirms the above findings. IMPRESSION: Large bilateral pulmonary emboli are noted. Positive for acute PE with CT evidence of right heart strain (RV/LV Ratio = 2.5) consistent with at least submassive (intermediate risk) PE. The presence of right heart strain has been associated with an increased risk of morbidity and mortality. Please refer to the "Code PE Focused" order set in EPIC. Critical Value/emergent results were called by telephone at the time  of interpretation on 03/22/2022 at 4:23 pm to provider Farrell Ours, RN, who verbally acknowledged these results and stated that they were already aware of this diagnosis. 14 mm left axillary lymph node is noted which is decreased compared to prior exam. Small foci of soft tissue gas are noted in the cervical soft tissues anteriorly of uncertain etiology. Infection cannot be excluded. Moderate gallbladder wall thickening is noted without cholelithiasis. It is uncertain if this represents cholecystitis, or potentially be secondary to adjacent hepatocellular disease. The liver is somewhat heterogeneous in appearance with periportal lucencies suggesting possible hepatitis. Mild inflammatory stranding is noted around the pancreas suggesting pancreatitis. Small amount of ascites is noted in the pelvis. Large amount of stool is noted in the rectum concerning for impaction. Electronically Signed   By: Marijo Conception M.D.   On: 03/22/2022 16:24   DG Chest Port 1 View  Result Date: 03/22/2022 CLINICAL DATA:  Shortness of breath, increased weakness, had chemotherapy on Tuesday, history breast cancer EXAM: PORTABLE CHEST 1 VIEW COMPARISON:  Portable exam 1210 hours compared to 12/07/2021 FINDINGS: RIGHT jugular Port-A-Cath with tip projecting over RIGHT atrium. Borderline enlargement of cardiac silhouette. Mediastinal contours and pulmonary vascularity normal. Lungs clear. No infiltrate, pleural effusion, or pneumothorax. IMPRESSION: No acute abnormalities. Electronically Signed   By: Lavonia Dana M.D.   On: 03/22/2022 12:16    Procedures .Critical Care Performed by: Hayden Rasmussen, MD Authorized by: Hayden Rasmussen, MD   Critical care provider statement:    Critical care time (minutes):  45   Critical care time was exclusive of:  Separately billable procedures and treating other patients   Critical care was necessary to treat or prevent imminent or life-threatening deterioration of the following conditions:   Shock   Critical care was time spent personally by me on the following activities:  Development of treatment plan with patient or surrogate, discussions with consultants, evaluation of patient's response to treatment, examination of patient, obtaining history from patient or surrogate, ordering and performing treatments and interventions, ordering and review of laboratory studies, ordering and review of radiographic studies, pulse oximetry, re-evaluation of patient's condition and review of old charts   I assumed  direction of critical care for this patient from another provider in my specialty: no      Medications Ordered in ED Medications  norepinephrine (LEVOPHED) '4mg'$  in 219m (0.016 mg/mL) premix infusion (25 mcg/min Intravenous Rate/Dose Change 03/22/22 1431)  0.9 %  sodium chloride infusion (0 mL/hr Intravenous Hold 03/22/22 1622)  vasopressin (PITRESSIN) 20 Units in sodium chloride 0.9 % 100 mL infusion-*FOR SHOCK* (0.03 Units/min Intravenous New Bag/Given 03/22/22 1456)  docusate sodium (COLACE) capsule 100 mg (has no administration in time range)  polyethylene glycol (MIRALAX / GLYCOLAX) packet 17 g (has no administration in time range)  vancomycin (VANCOREADY) IVPB 1250 mg/250 mL (has no administration in time range)  ceFEPIme (MAXIPIME) 2 g in sodium chloride 0.9 % 100 mL IVPB (has no administration in time range)  sodium chloride (PF) 0.9 % injection (has no administration in time range)  sodium bicarbonate 150 mEq in dextrose 5 % 1,150 mL infusion ( Intravenous New Bag/Given 03/22/22 1645)  heparin ADULT infusion 100 units/mL (25000 units/2558m (has no administration in time range)  sodium chloride 0.9 % bolus 500 mL (0 mLs Intravenous Stopped 03/22/22 1316)  iohexol (OMNIPAQUE) 350 MG/ML injection 80 mL (80 mLs Intravenous Contrast Given 03/22/22 1553)  ondansetron (ZOFRAN) injection 4 mg (4 mg Intravenous Given 03/22/22 1307)  sodium chloride 0.9 % bolus 1,000 mL (0 mLs Intravenous Stopped  03/22/22 1357)  sodium chloride 0.9 % bolus 1,000 mL (0 mLs Intravenous Stopped 03/22/22 1408)  ceFEPIme (MAXIPIME) 2 g in sodium chloride 0.9 % 100 mL IVPB (0 g Intravenous Stopped 03/22/22 1435)  metroNIDAZOLE (FLAGYL) IVPB 500 mg (500 mg Intravenous New Bag/Given 03/22/22 1404)  vancomycin (VANCOREADY) IVPB 1500 mg/300 mL (1,500 mg Intravenous New Bag/Given 03/22/22 1439)  sodium bicarbonate injection 50 mEq (50 mEq Intravenous Given 03/22/22 1539)  tenecteplase (TNKASE) injection for PE/MI 45 mg (45 mg Intravenous Given 03/22/22 1620)    ED Course/ Medical Decision Making/ A&P Clinical Course as of 03/22/22 1711  Thu Mar 22, 2022  1231 Chest x-ray showing cardiomegaly no gross infiltrates.  Port right upper chest.  Awaiting radiology reading. [MB]  [id number] showing low hemoglobin although stable from priors. [MB]  [id number] to patient's room for change in status.  Blood pressure dropped she is clammy.  Getting IV fluids through pressure bag.  Zofran ordered. [MB]  132778 EUMPNTIRWemaining low after fluid bolus.  She was initially clammy but seem to be drying out and more alert.  Initiating Levophed and paged critical care for evaluation. [MB]  [id number] attending evaluating patient.  Anticipate that they will be admitting to ICU. [MB]    Clinical Course User Index [MB] BuHayden RasmussenMD                           Medical Decision Making Amount and/or Complexity of Data Reviewed Labs: ordered. Radiology: ordered.  Risk Prescription drug management. Decision regarding hospitalization.   This patient complains of low blood pressure shortness of breath; this involves an extensive number of treatment Options and is a complaint that carries with it a high risk of complications and morbidity. The differential includes dehydration, sepsis, shock, PE, pneumothorax, pneumonia  I ordered, reviewed and interpreted labs, which included CBC with normal white count, low hemoglobin better than priors,  chemistries with elevated glucose elevated creatinine, mild elevation of LFTs, lactate mildly elevated but rising, troponins elevated and rising I ordered medication IV fluids IV antibiotics IV pressors and  reviewed PMP when indicated. I ordered imaging studies which included chest x-ray, CT chest, CT abdomen pelvis and I independently    visualized and interpreted imaging which showed large PE Additional history obtained from patient's family members and EMS Previous records obtained and reviewed in epic including recent oncology notes I consulted critical care and discussed lab and imaging findings and discussed disposition.  Cardiac monitoring reviewed, normal sinus rhythm Social determinants considered, no significant barriers Critical Interventions: Initiation of fluids and IV antibiotics for possible shock sepsis.  After the interventions stated above, I reevaluated the patient and found patient still to be with soft blood pressures and altered mental status. Admission and further testing considered, she needs admission to the hospital for further management.  At time of handoff to critical care she is still pending a CT chest abdomen and pelvis.  May end up needing coagulation condition guarded         Final Clinical Impression(s) / ED Diagnoses Final diagnoses:  Cardiogenic shock [facility])    Rx / DC Orders ED Discharge Orders     None         Hayden Rasmussen, MD 03/22/22 1715

## 2022-03-22 NOTE — ED Notes (Signed)
Pt increased to 3L O2 via nasal cannula

## 2022-03-23 ENCOUNTER — Encounter (HOSPITAL_COMMUNITY): Payer: Self-pay | Admitting: Student

## 2022-03-23 ENCOUNTER — Inpatient Hospital Stay (HOSPITAL_COMMUNITY): Payer: Commercial Managed Care - HMO

## 2022-03-23 ENCOUNTER — Other Ambulatory Visit (HOSPITAL_COMMUNITY): Payer: Commercial Managed Care - HMO

## 2022-03-23 DIAGNOSIS — N179 Acute kidney failure, unspecified: Secondary | ICD-10-CM

## 2022-03-23 DIAGNOSIS — J189 Pneumonia, unspecified organism: Secondary | ICD-10-CM

## 2022-03-23 DIAGNOSIS — I2699 Other pulmonary embolism without acute cor pulmonale: Secondary | ICD-10-CM | POA: Diagnosis not present

## 2022-03-23 DIAGNOSIS — I2609 Other pulmonary embolism with acute cor pulmonale: Secondary | ICD-10-CM

## 2022-03-23 LAB — ECHOCARDIOGRAM COMPLETE
AR max vel: 1.98 cm2
AV Area VTI: 2.04 cm2
AV Area mean vel: 1.9 cm2
AV Mean grad: 4 mmHg
AV Peak grad: 8 mmHg
Ao pk vel: 1.42 m/s
Area-P 1/2: 4.17 cm2
Calc EF: 41.9 %
Height: 65 in
S' Lateral: 3.5 cm
Single Plane A2C EF: 40.8 %
Single Plane A4C EF: 41.8 %
Weight: 3051.17 oz

## 2022-03-23 LAB — HEPARIN LEVEL (UNFRACTIONATED)
Heparin Unfractionated: 0.34 IU/mL (ref 0.30–0.70)
Heparin Unfractionated: 0.51 IU/mL (ref 0.30–0.70)
Heparin Unfractionated: 0.44 IU/mL (ref 0.30–0.70)

## 2022-03-23 LAB — CBC
HCT: 23.5 % — ABNORMAL LOW (ref 36.0–46.0)
Hemoglobin: 7.6 g/dL — ABNORMAL LOW (ref 12.0–15.0)
MCH: 27.8 pg (ref 26.0–34.0)
MCHC: 32.3 g/dL (ref 30.0–36.0)
MCV: 86.1 fL (ref 80.0–100.0)
Platelets: 170 10*3/uL (ref 150–400)
RBC: 2.73 MIL/uL — ABNORMAL LOW (ref 3.87–5.11)
RDW: 18.3 % — ABNORMAL HIGH (ref 11.5–15.5)
WBC: 6.1 10*3/uL (ref 4.0–10.5)
nRBC: 0 % (ref 0.0–0.2)

## 2022-03-23 LAB — BASIC METABOLIC PANEL
Anion gap: 6 (ref 5–15)
BUN: 21 mg/dL (ref 8–23)
CO2: 24 mmol/L (ref 22–32)
Calcium: 7.3 mg/dL — ABNORMAL LOW (ref 8.9–10.3)
Chloride: 113 mmol/L — ABNORMAL HIGH (ref 98–111)
Creatinine, Ser: 1.41 mg/dL — ABNORMAL HIGH (ref 0.44–1.00)
GFR, Estimated: 42 mL/min — ABNORMAL LOW (ref >60)
Glucose, Bld: 119 mg/dL — ABNORMAL HIGH (ref 70–99)
Potassium: 3.5 mmol/L (ref 3.5–5.1)
Sodium: 143 mmol/L (ref 135–145)

## 2022-03-23 LAB — URINE CULTURE: Culture: NO GROWTH

## 2022-03-23 LAB — PROCALCITONIN: Procalcitonin: 2.36 ng/mL

## 2022-03-23 LAB — PHOSPHORUS: Phosphorus: 3.5 mg/dL (ref 2.5–4.6)

## 2022-03-23 LAB — VANCOMYCIN, RANDOM: Vancomycin Rm: 14 ug/mL

## 2022-03-23 LAB — MRSA NEXT GEN BY PCR, NASAL: MRSA by PCR Next Gen: NOT DETECTED

## 2022-03-23 LAB — MAGNESIUM: Magnesium: 1.7 mg/dL (ref 1.7–2.4)

## 2022-03-23 MED ORDER — VANCOMYCIN HCL 750 MG/150ML IV SOLN
750.0000 mg | INTRAVENOUS | Status: DC
  Filled 2022-03-23: qty 150

## 2022-03-23 MED ORDER — MAGNESIUM SULFATE 2 GM/50ML IV SOLN
2.0000 g | Freq: Once | INTRAVENOUS | Status: AC
  Administered 2022-03-23: 2 g via INTRAVENOUS
  Filled 2022-03-23: qty 50

## 2022-03-23 MED ORDER — CEFDINIR 300 MG PO CAPS
300.0000 mg | ORAL_CAPSULE | Freq: Two times a day (BID) | ORAL | Status: DC
  Administered 2022-03-23 – 2022-03-25 (×5): 300 mg via ORAL
  Filled 2022-03-23 (×6): qty 1

## 2022-03-23 MED ORDER — SODIUM CHLORIDE 0.9% FLUSH: 10.0000 mL | Freq: Two times a day (BID) | INTRAVENOUS | Status: DC

## 2022-03-23 MED ORDER — LACTATED RINGERS IV SOLN: INTRAVENOUS | Status: DC

## 2022-03-23 MED ORDER — SODIUM CHLORIDE 0.9% FLUSH
10.0000 mL | INTRAVENOUS | Status: DC | PRN
  Administered 2022-03-25: 10 mL

## 2022-03-23 MED ORDER — HEPARIN (PORCINE) 25000 UT/250ML-% IV SOLN
850.0000 [IU]/h | INTRAVENOUS | Status: DC
Start: 2022-03-23 — End: 2022-03-24
  Administered 2022-03-23: 850 [IU]/h via INTRAVENOUS
  Filled 2022-03-23: qty 250

## 2022-03-23 MED ORDER — METOPROLOL TARTRATE 50 MG PO TABS
50.0000 mg | ORAL_TABLET | Freq: Two times a day (BID) | ORAL | Status: DC
Start: 2022-03-23 — End: 2022-03-25
  Administered 2022-03-23 – 2022-03-25 (×5): 50 mg via ORAL
  Filled 2022-03-23: qty 2
  Filled 2022-03-23 (×4): qty 1

## 2022-03-23 MED ORDER — SALINE SPRAY 0.65 % NA SOLN
1.0000 | NASAL | Status: DC | PRN
  Administered 2022-03-23: 1 via NASAL
  Filled 2022-03-23: qty 44

## 2022-03-23 MED ORDER — POTASSIUM CHLORIDE CRYS ER 20 MEQ PO TBCR
40.0000 meq | EXTENDED_RELEASE_TABLET | Freq: Once | ORAL | Status: AC
  Administered 2022-03-23: 40 meq via ORAL
  Filled 2022-03-23: qty 2

## 2022-03-23 NOTE — Progress Notes (Signed)
[facility] Progress Note Patient Name: JONAI WEYLAND DOB: 01/04/1956 MRN: [id number]   Date of Service  03/23/2022  HPI/Events of Note  Dry nose while on nasal o2  eICU Interventions  Nasal spray ordered      Intervention Category Minor Interventions: Routine modifications to care plan (e.g. PRN medications for pain, fever)  Margaretmary Lombard 03/23/2022, 4:13 AM

## 2022-03-23 NOTE — Progress Notes (Addendum)
[place] for heparin s/p TNK Indication: pulmonary embolus  Allergies  Allergen Reactions   Prednisone Hypertension   Rosuvastatin     Other reaction(s): Myalgias (Muscle Pain)   Other     Had a rxn to cortisone knee injection - had elevated BP and HR   Robitussin (Alcohol Free) [Guaifenesin] Hives   Sulfa Antibiotics Hives    Patient Measurements: Height: '5\' 5"'$  (165.1 cm) Weight: 86.5 kg (190 lb 11.2 oz) IBW/kg (Calculated) : 57 Heparin Dosing Weight: 75 kg  Vital Signs: Temp: 98.3 F (36.8 C) (06/02 1343) Temp Source: Oral (06/02 1343) BP: 132/71 (06/02 1200) Pulse Rate: 87 (06/02 1200)  Labs: Recent Labs    03/22/22 1151 03/22/22 1313 03/22/22 1526 03/22/22 1630 03/22/22 2327 03/23/22 0514  HGB 9.2*  --  9.0* 7.8*  --  7.6*  HCT 28.4*  --  30.2* 25.9*  --  23.5*  PLT 248  --  184 148*  --  170  APTT  --   --   --  35  --   --   LABPROT  --   --   --  18.6*  --   --   INR  --   --   --  1.6*  --   --   HEPARINUNFRC  --   --   --   --  0.34 0.51  CREATININE 0.78  --  1.41*  --   --  1.41*  TROPONINIHS 352* 410*  --   --   --   --      Estimated Creatinine Clearance: 43.8 mL/min (A) (by C-G formula based on SCr of 1.41 mg/dL (H)).   Medical History: Past Medical History:  Diagnosis Date   Acute dyspnea    Acute metabolic encephalopathy    Anxiety    Arthritis    right knee    Elevated troponin    GERD (gastroesophageal reflux disease)    Hyperlipidemia    Hypertension    Lactic acidosis    Obstructive cardiovascular shock ([place]) 03/22/2022   Positive colorectal cancer screening using Cologuard test     Medications:  Scheduled:   cefdinir  300 mg Oral Q12H   Chlorhexidine Gluconate Cloth  6 each Topical Q0600   mouth rinse  15 mL Mouth Rinse BID   metoprolol tartrate  50 mg Oral BID    Assessment: 66 yo female presenting with shortness of breath and weakness. No anticoag PTA.  CT angio positive for  bilateral large pulmonary emboli consistent with submassive PE. RHS noted (RV/LV ratio 2.5).  Patient received IV TNK '45mg'$  6/1 at 1616.    6/1: aPTT collected 30 minutes after TNK is 35 seconds. Ok to begin heparin drip without bolus now as aPTT is < 80 seconds.   03/23/2022 PM UPDATE Heparin level therapeutic at 0.44 on heparin 850 units/hr  Hgb down to 7.6, plts up 170 Scr remains up 1.41 No bleeding per discussion w/ RN  Goal of Therapy:  Heparin 0.3-0.5 x 24 hours after TNK, then 0.3-0.7 Monitor platelets by anticoagulation protocol: Yes   Plan:  Continue heparin at 850 units/hr Heparin goal now 0.3-0.7 as >24h since TNK administration Monitor daily heparin level, CBC, signs/symptoms of bleeding F/u ability to transition to PO anticoagulation  Dimple Nanas, PharmD 03/23/2022 2:51 PM

## 2022-03-23 NOTE — Progress Notes (Signed)
[facility] ADULT ICU REPLACEMENT PROTOCOL   The patient does apply for the [facility] Adult ICU Electrolyte Replacment Protocol based on the criteria listed below:   1.Exclusion criteria: TCTS patients, ECMO patients, and Dialysis patients 2. Is GFR >/= 30 ml/min? Yes.    Patient's GFR today is 42 3. Is SCr </= 2? Yes.   Patient's SCr is 1.41 mg/dL 4. Did SCr increase >/= 0.5 in 24 hours? No. 5.Pt's weight >40kg  Yes.   6. Abnormal electrolyte(s): K+ 3.5, Mag 1.7  7. Electrolytes replaced per protocol 8.  Call MD STAT for K+ </= 2.5, Phos </= 1, or Mag </= 1 Physician:  Lorayne Marek 03/23/2022 6:17 AM

## 2022-03-23 NOTE — Progress Notes (Signed)
Pharmacy Antibiotic Note  Cassandra Brennan is a 66 y.o. female admitted on 03/22/2022 with sepsis of unknown source.  Endorses shortness of breath and increased weakness.  Patient recently received chemotherapy 3 days prior to admission for history of breast cancer.  Pharmacy has been consulted for vancomycin and cefepime dosing.  On admit: WBC 5.2, Scr 0.78, LA 2 >> 4.4, afebrile  Today, 03/23/22 Patient is afebrile, serum creatinine stabilizing at 1.41, WBC down 6.1, pct 2.36 Random vancomycin level less than 20    Plan: Continue vancomycin at 750 mg IV every 24 hours Continue Cefepime at 2 g IV q12 hours Monitor clinical progress, renal function, vancomycin levels as indicated F/U C&S, abx deescalation / LOT  Height: '5\' 5"'$  (165.1 cm) Weight: 86.5 kg (190 lb 11.2 oz) IBW/kg (Calculated) : 57  Temp (24hrs), Avg:98.2 F (36.8 C), Min:97.7 F (36.5 C), Max:98.7 F (37.1 C)  Recent Labs  Lab 03/20/22 0918 03/22/22 1151 03/22/22 1313 03/22/22 1526 03/22/22 1630 03/22/22 1820 03/23/22 0514  WBC 4.0 5.2  --  6.0 6.6  --  6.1  CREATININE 0.69 0.78  --  1.41*  --   --  1.41*  LATICACIDVEN  --  2.0* 4.4* 8.2*  --  6.1*  --   VANCORANDOM  --   --   --   --   --   --  14     Estimated Creatinine Clearance: 43.8 mL/min (A) (by C-G formula based on SCr of 1.41 mg/dL (H)).    Allergies  Allergen Reactions   Prednisone Hypertension   Rosuvastatin     Other reaction(s): Myalgias (Muscle Pain)   Other     Had a rxn to cortisone knee injection - had elevated BP and HR   Robitussin (Alcohol Free) [Guaifenesin] Hives   Sulfa Antibiotics Hives    Antimicrobials this admission: Vancomycin 6/1 >>  Cefepime 6/1 >>   Dose adjustments this admission: 6/2 vancomycin dosing using random levels changed to vancomycin 750 mg IV q24h  Microbiology results: 6/1 BCx: sent 6/1 Ucx: sent 6/1 MRSA PCR: not detected   Thank you for allowing pharmacy to be a part of this patient's  care.  Royetta Asal, PharmD, BCPS Clinical Pharmacist [place] Please utilize Amion for appropriate phone number to reach the unit pharmacist ([place]) 03/23/2022 8:28 AM

## 2022-03-23 NOTE — TOC Progression Note (Signed)
Transition of Care [facility]) - Progression Note    Patient Details  Name: Cassandra Brennan MRN: [id number] Date of Birth: 12/07/1955  Transition of Care [facility]) CM/SW Contact  Genesis Paget, Juliann Pulse, RN Phone Number: 03/23/2022, 2:11 PM  Clinical Narrative: Will check benefit for eliquis/xarelto-await outcome.           Expected Discharge Plan and Services                                                 Social Determinants of Health (SDOH) Interventions    Readmission Risk Interventions     View : No data to display.

## 2022-03-23 NOTE — TOC Benefit Eligibility Note (Signed)
Transition of Care [facility]) Benefit Eligibility Note    Patient Details  Name: ZLATA ALCAIDE MRN: [id number] Date of Birth: Jan 23, 1956   Medication/Dose: Eliquis/Apixaban 70m po qd;xarelto/rivaroxaban 22mpo qd;Dx:PE.  Covered?: Yes  Tier: Other  Prescription Coverage Preferred Pharmacy: local  Spoke with Person/Company/Phone Number:: Sheene/ [employer]  Co-Pay: Prior auth need first to get copay [phone] Approval: Yes ([id number] Deductible: Met  Additional Notes: Eliquis/Apixaban 39m52mo qd;xarelto/rivaroxaban 26m22m qd;Dx:PE. generic not on formulary name brand is but requires prior auth for both scripts [id number] Number: 03/23/2022, 2:44 PM

## 2022-03-23 NOTE — Progress Notes (Signed)
  Transition of Care [facility]) Screening Note   Patient Details  Name: Cassandra Brennan Date of Birth: Oct 29, 1955   Transition of Care [facility]) CM/SW Contact:    Dessa Phi, RN Phone Number: 03/23/2022, 9:59 AM    Transition of Care Department [facility]) has reviewed patient and no TOC needs have been identified at this time. We will continue to monitor patient advancement through interdisciplinary progression rounds. If new patient transition needs arise, please place a TOC consult.

## 2022-03-23 NOTE — Progress Notes (Signed)
NAME:  Cassandra Brennan, MRN:  [id number], DOB:  August 17, 1956, LOS: 1 ADMISSION DATE:  03/22/2022, CONSULTATION DATE:  6/1 REFERRING MD:  butler, CHIEF COMPLAINT:  shock    History of Present Illness:  70 yrear old female w/ hx as per below. Presented to [facility] ER w/ new onset weakness and fatigue X 24 hrs EMS arrival pulse thready and tachycardic w/ cold clammy skin. Reported isolated episode of vomiting   In ER HR 120s, sbp 60s, lactate 2-->4.4  CXR clear  Cultures sent. IVFs administered Empiric abx started.  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 Progressive over 24hrs. Lactate rising in ER from 2 to 4.4 in spite of 77m/kg IVFs. Cultures sent abx ordered (vanc and cefepime). Stat CT chest ordered. Large bilateral pulmonary emboli are noted. Positive for acute PE with CT evidence of right heart strain (RV/LV Ratio = 2.5). Small foci of soft tissue gas are noted in the cervical soft tissues anteriorly of uncertain etiology. Infection cannot be excluded.  Moderate gallbladder wall thickening is noted without cholelithiasis. It is uncertain if this represents cholecystitis, or potentially be secondary to adjacent hepatocellular disease. The liver is somewhat heterogeneous in appearance with periportal lucencies suggesting possible hepatitis.Received TNK at bedside at 1616. Had complete resolution of shock state by 1700.    Mild inflammatory stranding is noted around the pancreas suggesting pancreatitis. 6/2 stable on IV heparin. No pain. Off all oxygen. Antibiotics narrowed. Transfer to tele  Interim History / Subjective:  Feels 100%  better. No pain or shortness of breath   Objective   Blood pressure 105/71, pulse 90, temperature 98.1 F (36.7 C), temperature source Oral, resp. rate (Abnormal) 28, height '5\' 5"'$  (1.651 m), weight 86.5 kg, SpO2 94 %.        Intake/Output Summary (Last 24 hours) at 03/23/2022 0854 Last data filed at 03/23/2022 0600 Gross per 24 hour  Intake 3751.78 ml  Output 550 ml  Net 3201.78 ml   Filed Weights   03/22/22 1123 03/22/22 1647 03/23/22 0600  Weight: 81.5 kg 83.7 kg 86.5 kg    Examination: General resting in bed no distress HENT NCAT no JVD  Pulm clear on room air  Card rrr Abd soft  Ext warm and dry  Neuro intact  Assessment & Plan:   Active Hospital Problems   Diagnosis Date Noted   Acute massive pulmonary embolism ([place] 03/22/2022    Priority: 1.   Pneumonia 03/23/2022    Priority: 2.   AKI (acute kidney injury) ([place] 03/23/2022    Priority: 3.   Chronic anemia     Priority: 5.   GERD (gastroesophageal reflux disease)    Malignant neoplasm of upper-inner quadrant of left breast in female, estrogen receptor positive ([place] 11/06/2021    Resolved Hospital Problems   Diagnosis Date Noted Date Resolved   Obstructive cardiovascular shock ([place] 03/22/2022 03/23/2022    Priority: 1.   Lactic acidosis  03/23/2022    Priority: 1.   Elevated troponin  03/23/2022    Priority: 2.   Acute dyspnea  03/23/2022    Priority: 3.   Acute metabolic encephalopathy  03/23/2022    Priority: 4.     Acute massive Pulmonary Emboli w/ now resolved Obstructive shock and respiratory failure s/p IV TNK on 6/1 Plan Cont heparin gtt today  DOAC in am assuming no issues Adv diet OOB F/u echo Transfer to tele   PNA. RLL  Plan Dc vanc and cefepime Complete 5 total d abx (changed to cefdinir)  AKI 2/2 shock Renal fxn stable Plan Cont gentle IVFs Am chem   Chronic anemia  Baseline hgb 8.9, currently down just over 1 gm at 7.6 no evidence of bleeding. Think may be element of  dilution  Plan Am cbc    Hyperglycemia Plan Was isolated; monitor am glucose   Breast cancer Plan F/u med/onc as out pt   Best Practice (right click and "Reselect all SmartList Selections" daily)   Diet/type: NPO DVT prophylaxis: prophylactic heparin  GI prophylaxis: N/A Lines: Central line and yes and it is still needed Foley:  N/A Code Status:  full code Last date of multidisciplinary goals of care discussion [pending ]  Critical care time:    Erick Colace ACNP-BC [place] Pager # [phone] OR # [phone] if no answer

## 2022-03-23 NOTE — Progress Notes (Signed)
[place] for heparin s/p TNK Indication: pulmonary embolus  Allergies  Allergen Reactions   Prednisone Hypertension   Rosuvastatin     Other reaction(s): Myalgias (Muscle Pain)   Other     Had a rxn to cortisone knee injection - had elevated BP and HR   Robitussin (Alcohol Free) [Guaifenesin] Hives   Sulfa Antibiotics Hives    Patient Measurements: Height: '5\' 5"'$  (165.1 cm) Weight: 86.5 kg (190 lb 11.2 oz) IBW/kg (Calculated) : 57 Heparin Dosing Weight: 75 kg  Vital Signs: Temp: 98.4 F (36.9 C) (06/02 0400) Temp Source: Oral (06/02 0400) BP: 105/71 (06/02 0600) Pulse Rate: 90 (06/02 0600)  Labs: Recent Labs    03/22/22 1151 03/22/22 1313 03/22/22 1526 03/22/22 1630 03/22/22 2327 03/23/22 0514  HGB 9.2*  --  9.0* 7.8*  --  7.6*  HCT 28.4*  --  30.2* 25.9*  --  23.5*  PLT 248  --  184 148*  --  170  APTT  --   --   --  35  --   --   LABPROT  --   --   --  18.6*  --   --   INR  --   --   --  1.6*  --   --   HEPARINUNFRC  --   --   --   --  0.34 0.51  CREATININE 0.78  --  1.41*  --   --  1.41*  TROPONINIHS 352* 410*  --   --   --   --      Estimated Creatinine Clearance: 43.8 mL/min (A) (by C-G formula based on SCr of 1.41 mg/dL (H)).   Medical History: Past Medical History:  Diagnosis Date   Anxiety    Arthritis    right knee    GERD (gastroesophageal reflux disease)    Hyperlipidemia    Hypertension    Positive colorectal cancer screening using Cologuard test     Medications:  Scheduled:   Chlorhexidine Gluconate Cloth  6 each Topical Q0600   mouth rinse  15 mL Mouth Rinse BID   vancomycin variable dose per unstable renal function (pharmacist dosing)   Does not apply See admin instructions    Assessment: 66 yo female presenting with shortness of breath and weakness. No anticoag PTA.  CT angio positive for bilateral large pulmonary emboli consistent with submassive PE. RHS noted (RV/LV ratio 2.5).  Patient  received IV TNK '45mg'$  6/1 at 1616.    6/1: aPTT collected 30 minutes after TNK is 35 seconds. Ok to begin heparin drip without bolus now as aPTT is < 80 seconds.   03/23/2022 05:14 HL 0.51 slightly supra-therapeutic on heparin infusing at 900 units/hr Hgb down to 7.6, plts up 170 Scr remains up 1.41 No bleeding per nurse  Goal of Therapy:  Heparin 0.3-0.5 x 24 hours after TNK, then 0.3-0.7 Monitor platelets by anticoagulation protocol: Yes   Plan:  Decrease heparin infusion to 850 units/hr Check heparin level 6 hours  Monitor daily heparin level, CBC, signs/symptoms of bleeding     Royetta Asal, PharmD, [place] Please utilize Amion for appropriate phone number to reach the unit pharmacist ([place]) 03/23/2022 7:54 AM

## 2022-03-23 NOTE — Progress Notes (Signed)
ANTICOAGULATION CONSULT NOTE - Initial Consult  Pharmacy Consult for heparin s/p TNK Indication: pulmonary embolus  Allergies  Allergen Reactions   Prednisone Hypertension   Rosuvastatin     Other reaction(s): Myalgias (Muscle Pain)   Other     Had a rxn to cortisone knee injection - had elevated BP and HR   Robitussin (Alcohol Free) [Guaifenesin] Hives   Sulfa Antibiotics Hives    Patient Measurements: Height: '5\' 5"'$  (165.1 cm) Weight: 83.7 kg (184 lb 8.4 oz) IBW/kg (Calculated) : 57 Heparin Dosing Weight: 75 kg  Vital Signs: Temp: 98.5 F (36.9 C) (06/02 0000) Temp Source: Oral (06/02 0000) BP: 114/82 (06/02 0000) Pulse Rate: 94 (06/02 0000)  Labs: Recent Labs    03/20/22 0918 03/20/22 0918 03/22/22 1151 03/22/22 1313 03/22/22 1526 03/22/22 1630 03/22/22 2327  HGB 8.9*   < > 9.2*  --  9.0* 7.8*  --   HCT 26.9*  --  28.4*  --  30.2* 25.9*  --   PLT 243  --  248  --  184 148*  --   APTT  --   --   --   --   --  35  --   LABPROT  --   --   --   --   --  18.6*  --   INR  --   --   --   --   --  1.6*  --   HEPARINUNFRC  --   --   --   --   --   --  0.34  CREATININE 0.69  --  0.78  --  1.41*  --   --   TROPONINIHS  --   --  352* 410*  --   --   --    < > = values in this interval not displayed.     Estimated Creatinine Clearance: 43.1 mL/min (A) (by C-G formula based on SCr of 1.41 mg/dL (H)).   Medical History: Past Medical History:  Diagnosis Date   Anxiety    Arthritis    right knee    GERD (gastroesophageal reflux disease)    Hyperlipidemia    Hypertension    Positive colorectal cancer screening using Cologuard test     Medications:  Scheduled:   Chlorhexidine Gluconate Cloth  6 each Topical Q0600   mouth rinse  15 mL Mouth Rinse BID   sodium chloride (PF)       vancomycin variable dose per unstable renal function (pharmacist dosing)   Does not apply See admin instructions    Assessment: 66 yo female presenting with shortness of breath and  weakness. No anticoag PTA.  CT angio positive for bilateral large pulmonary emboli consistent with submassive PE. RHS noted (RV/LV ratio 2.5).  Patient received IV TNK '45mg'$  6/1 at 1616.    6/1: aPTT collected 30 minutes after TNK is 35 seconds. Ok to begin heparin drip without bolus now as aPTT is < 80 seconds.   03/23/2022 HL 0.34 therapeutic on 900 units/hr Hgb down to 7.8, plts down to 148 Scr up 1.41 INR 1.6 Per RN no bleeding noted  Goal of Therapy:  Heparin 0.3-0.5 x 24 hours after TNK, then 0.3-0.7 Monitor platelets by anticoagulation protocol: Yes   Plan:  continue heparin infusion at 900 units/hr Check heparin level 6 hours  Monitor heparin level and CBC daily  Dolly Rias RPh 03/23/2022, 12:30 AM

## 2022-03-24 ENCOUNTER — Inpatient Hospital Stay (HOSPITAL_COMMUNITY): Payer: Commercial Managed Care - HMO

## 2022-03-24 ENCOUNTER — Other Ambulatory Visit: Payer: Self-pay

## 2022-03-24 DIAGNOSIS — Z170 Estrogen receptor positive status [ER+]: Secondary | ICD-10-CM

## 2022-03-24 DIAGNOSIS — I2699 Other pulmonary embolism without acute cor pulmonale: Secondary | ICD-10-CM

## 2022-03-24 DIAGNOSIS — R578 Other shock: Secondary | ICD-10-CM

## 2022-03-24 DIAGNOSIS — C50212 Malignant neoplasm of upper-inner quadrant of left female breast: Secondary | ICD-10-CM

## 2022-03-24 DIAGNOSIS — N179 Acute kidney failure, unspecified: Secondary | ICD-10-CM

## 2022-03-24 DIAGNOSIS — J189 Pneumonia, unspecified organism: Secondary | ICD-10-CM

## 2022-03-24 LAB — COMPREHENSIVE METABOLIC PANEL
ALT: 380 U/L — ABNORMAL HIGH (ref 0–44)
AST: 177 U/L — ABNORMAL HIGH (ref 15–41)
Albumin: 2.9 g/dL — ABNORMAL LOW (ref 3.5–5.0)
Alkaline Phosphatase: 74 U/L (ref 38–126)
Anion gap: 6 (ref 5–15)
BUN: 14 mg/dL (ref 8–23)
CO2: 25 mmol/L (ref 22–32)
Calcium: 8 mg/dL — ABNORMAL LOW (ref 8.9–10.3)
Chloride: 110 mmol/L (ref 98–111)
Creatinine, Ser: 0.83 mg/dL (ref 0.44–1.00)
GFR, Estimated: >60 mL/min (ref >60)
Glucose, Bld: 108 mg/dL — ABNORMAL HIGH (ref 70–99)
Potassium: 3.6 mmol/L (ref 3.5–5.1)
Sodium: 141 mmol/L (ref 135–145)
Total Bilirubin: 0.6 mg/dL (ref 0.3–1.2)
Total Protein: 5.3 g/dL — ABNORMAL LOW (ref 6.5–8.1)

## 2022-03-24 LAB — CBC
HCT: 22.4 % — ABNORMAL LOW (ref 36.0–46.0)
Hemoglobin: 7.2 g/dL — ABNORMAL LOW (ref 12.0–15.0)
MCH: 27.9 pg (ref 26.0–34.0)
MCHC: 32.1 g/dL (ref 30.0–36.0)
MCV: 86.8 fL (ref 80.0–100.0)
Platelets: 178 10*3/uL (ref 150–400)
RBC: 2.58 MIL/uL — ABNORMAL LOW (ref 3.87–5.11)
RDW: 18.5 % — ABNORMAL HIGH (ref 11.5–15.5)
WBC: 4.2 10*3/uL (ref 4.0–10.5)
nRBC: 0 % (ref 0.0–0.2)

## 2022-03-24 LAB — HEPARIN LEVEL (UNFRACTIONATED): Heparin Unfractionated: 0.31 IU/mL (ref 0.30–0.70)

## 2022-03-24 LAB — PROCALCITONIN: Procalcitonin: 0.66 ng/mL

## 2022-03-24 MED ORDER — APIXABAN 5 MG PO TABS
10.0000 mg | ORAL_TABLET | Freq: Two times a day (BID) | ORAL | Status: DC
  Administered 2022-03-24 – 2022-03-25 (×3): 10 mg via ORAL
  Filled 2022-03-24 (×3): qty 2

## 2022-03-24 MED ORDER — APIXABAN 5 MG PO TABS: 5.0000 mg | ORAL_TABLET | Freq: Two times a day (BID) | ORAL | Status: DC

## 2022-03-24 NOTE — Progress Notes (Signed)
Bilateral lower extremity venous duplex completed. Refer to "CV Proc" under chart review to view preliminary results.  03/24/2022 12:24 PM Kelby Aline., MHA, RVT, RDCS, RDMS

## 2022-03-24 NOTE — Progress Notes (Signed)
ANTICOAGULATION CONSULT NOTE  Pharmacy Consult for heparin s/p TNK>> apixaban 6/3 Indication: pulmonary embolus  Allergies  Allergen Reactions   Prednisone Hypertension   Rosuvastatin     Other reaction(s): Myalgias (Muscle Pain)   Other     Had a rxn to cortisone knee injection - had elevated BP and HR   Robitussin (Alcohol Free) [Guaifenesin] Hives   Sulfa Antibiotics Hives    Patient Measurements: Height: '5\' 5"'$  (165.1 cm) Weight: 86.6 kg (190 lb 14.7 oz) IBW/kg (Calculated) : 57 Heparin Dosing Weight: 75 kg  Vital Signs: Temp: 98.4 F (36.9 C) (06/03 0451) Temp Source: Oral (06/03 0451) BP: 133/84 (06/03 0900) Pulse Rate: 93 (06/03 0900)  Labs: Recent Labs    03/22/22 1151 03/22/22 1313 03/22/22 1526 03/22/22 1630 03/22/22 2327 03/23/22 0514 03/23/22 1430 03/24/22 0421  HGB 9.2*  --  9.0* 7.8*  --  7.6*  --  7.2*  HCT 28.4*  --  30.2* 25.9*  --  23.5*  --  22.4*  PLT 248  --  184 148*  --  170  --  178  APTT  --   --   --  35  --   --   --   --   LABPROT  --   --   --  18.6*  --   --   --   --   INR  --   --   --  1.6*  --   --   --   --   HEPARINUNFRC  --   --   --   --    < > 0.51 0.44 0.31  CREATININE 0.78  --  1.41*  --   --  1.41*  --  0.83  TROPONINIHS 352* 410*  --   --   --   --   --   --    < > = values in this interval not displayed.     Estimated Creatinine Clearance: 74.4 mL/min (by C-G formula based on SCr of 0.83 mg/dL).   Assessment: 66 yo female presenting with shortness of breath and weakness. No anticoag PTA.  CT angio positive for bilateral large pulmonary emboli consistent with submassive PE. RHS noted (RV/LV ratio 2.5).  Patient received IV TNK '45mg'$  6/1 at 1616.   03/24/2022 Heparin level 0.31 on 850 units/hr - therapeutic Hg 7.2, PLT 178 No bleeding reported To transition to apixaban- per CM note 6/2 prior auth required  Goal of Therapy:  Heparin 0.3-0.5 x 24 hours after TNK, then 0.3-0.7 Monitor platelets by anticoagulation  protocol: Yes   Plan:  DC heparin drip & labs Apixaban 10 mg po bid x 7 days followed by apixaban 5 mg po bid Will educate & provide 30 day free card prior to discharge  Eudelia Bunch, Pharm.D 03/24/2022 9:18 AM

## 2022-03-24 NOTE — Plan of Care (Signed)
  Problem: Health Behavior/Discharge Planning: Goal: Ability to manage health-related needs will improve Outcome: Progressing   Problem: Clinical Measurements: Goal: Respiratory complications will improve Outcome: Adequate for Discharge

## 2022-03-24 NOTE — Progress Notes (Signed)
PROGRESS NOTE    Cassandra Brennan  DEY:[id number] DOB: 1955-10-24 DOA: 03/22/2022 PCP: Everardo Beals, NP   Brief Narrative:  66 year old female with history of breast cancer currently on chemotherapy, anxiety, GERD, hyperlipidemia, hypertension, anemia of chronic disease presented with weakness and fatigue.  On presentation, she was tachycardic, hypotensive with lactic acidosis.  She was initially started on IV fluids, pressors, broad-spectrum antibiotics and admitted to ICU under PCCM service.  She was found to have large bilateral pulmonary embolism with CT evidence of right heart strain along with moderate gallbladder wall thickening without cholelithiasis.  She received TNK with resolution of shock.  She was started on IV heparin.  IV antibiotics were switched to oral.  She was transferred to [facility] service from 03/24/2022 onwards  Assessment & Plan:   Acute massive pulmonary embolism: Present on admission Obstructive shock and acute respiratory failure: Present on admission, now resolved -Patient received TNK with resolution of shock and was subsequently started on IV heparin.  Off pressors.  Respiratory status has improved and currently on room air.  She was transferred to [facility] service from 03/24/2022 onwards -PCCM had planned to switch her to oral NOAC from today onwards.  Will start Eliquis today.  Monitor her overnight and possible discharge in her in AM. -Check bilateral lower extremity ultrasound duplex -echo showed EF of 50 to 55% with indeterminate left ventricular diastolic parameters.  Small mobile mass on the anterior mitral valve leaflet seen which could be calcification versus vegetation.  Will discuss with cardiology.  Right lower lobe pneumonia -Initially treated with broad-spectrum antibiotics.  Currently on oral cefdinir: Finish 5-day course of treatment  AKI -Resolved  Elevated LFTs -Possibly from shock liver.  Repeat a.m. LFTs. -Imaging had shown gallbladder wall  thickening without cholelithiasis.  Patient does not have abdominal pain.  Check hepatitis panel in a.m.  Chronic anemia -Hemoglobin 7.2 this morning.  Transfuse if hemoglobin is less than 7.  Breast cancer -Currently on chemotherapy.  Outpatient follow-up  Physical deconditioning -PT eval  DVT prophylaxis: Eliquis Code Status: Full Family Communication: None at bedside Disposition Plan: Status is: Inpatient Remains inpatient appropriate because: Of severity of illness    Consultants: PCCM  Procedures: Echo  Antimicrobials:  Anti-infectives (From admission, onward)    Start     Dose/Rate Route Frequency Ordered Stop   03/23/22 1400  vancomycin (VANCOREADY) IVPB 1250 mg/250 mL  Status:  Discontinued        1,250 mg 166.7 mL/hr over 90 Minutes Intravenous Every 24 hours 03/22/22 1509 03/22/22 1823   03/23/22 1000  vancomycin (VANCOREADY) IVPB 750 mg/150 mL  Status:  Discontinued        750 mg 150 mL/hr over 60 Minutes Intravenous Every 24 hours 03/23/22 0818 03/23/22 0847   03/23/22 1000  cefdinir (OMNICEF) capsule 300 mg        300 mg Oral Every 12 hours 03/23/22 0847 03/27/22 0959   03/23/22 0200  ceFEPIme (MAXIPIME) 2 g in sodium chloride 0.9 % 100 mL IVPB  Status:  Discontinued        2 g 200 mL/hr over 30 Minutes Intravenous Every 12 hours 03/22/22 1822 03/23/22 0847   03/22/22 2200  ceFEPIme (MAXIPIME) 2 g in sodium chloride 0.9 % 100 mL IVPB  Status:  Discontinued        2 g 200 mL/hr over 30 Minutes Intravenous Every 8 hours 03/22/22 1509 03/22/22 1822   03/22/22 1823  vancomycin variable dose per unstable renal function (pharmacist dosing)  Status:  Discontinued         Does not apply See admin instructions 03/22/22 1823 03/23/22 0819   03/22/22 1400  ceFEPIme (MAXIPIME) 2 g in sodium chloride 0.9 % 100 mL IVPB        2 g 200 mL/hr over 30 Minutes Intravenous  Once 03/22/22 1355 03/22/22 1435   03/22/22 1400  metroNIDAZOLE (FLAGYL) IVPB 500 mg        500 mg 100  mL/hr over 60 Minutes Intravenous  Once 03/22/22 1355 03/22/22 1505   03/22/22 1400  vancomycin (VANCOCIN) IVPB 1000 mg/200 mL premix  Status:  Discontinued        1,000 mg 200 mL/hr over 60 Minutes Intravenous  Once 03/22/22 1355 03/22/22 1358   03/22/22 1400  vancomycin (VANCOREADY) IVPB 1500 mg/300 mL        1,500 mg 150 mL/hr over 120 Minutes Intravenous  Once 03/22/22 1358 03/22/22 1711        Subjective: Patient seen and examined at bedside.  Feels better.  Denies worsening shortness of breath, nausea, vomiting, bleeding from any orifices.  Objective: Vitals:   03/24/22 0011 03/24/22 0451 03/24/22 0500 03/24/22 0900  BP: 124/78 (!) 146/87  133/84  Pulse: 91 91  93  Resp: 18 16    Temp: 98.5 F (36.9 C) 98.4 F (36.9 C)    TempSrc: Oral Oral    SpO2: 98% 98%    Weight:   86.6 kg   Height:        Intake/Output Summary (Last 24 hours) at 03/24/2022 1142 Last data filed at 03/24/2022 0758 Gross per 24 hour  Intake 1441.07 ml  Output 600 ml  Net 841.07 ml   Filed Weights   03/22/22 1647 03/23/22 0600 03/24/22 0500  Weight: 83.7 kg 86.5 kg 86.6 kg    Examination:  General exam: Appears calm and comfortable. room air. Respiratory system: Bilateral decreased breath sounds at bases with some scattered crackles Cardiovascular system: S1 & S2 heard, Rate controlled Gastrointestinal system: Abdomen is nondistended, soft and nontender. Normal bowel sounds heard. Extremities: No cyanosis, clubbing; lower extremity edema present  Central nervous system: Alert and oriented. No focal neurological deficits. Moving extremities Skin: No rashes, lesions or ulcers Psychiatry: Mostly flat affect.  Currently not agitated.    Data Reviewed: I have personally reviewed following labs and imaging studies  CBC: Recent Labs  Lab 03/20/22 0918 03/22/22 1151 03/22/22 1526 03/22/22 1630 03/23/22 0514 03/24/22 0421  WBC 4.0 5.2 6.0 6.6 6.1 4.2  NEUTROABS 3.1 4.6  --   --   --   --    HGB 8.9* 9.2* 9.0* 7.8* 7.6* 7.2*  HCT 26.9* 28.4* 30.2* 25.9* 23.5* 22.4*  MCV 84.3 86.9 93.5 91.5 86.1 86.8  PLT 243 248 184 148* 170 270   Basic Metabolic Panel: Recent Labs  Lab 03/20/22 0918 03/22/22 1151 03/22/22 1526 03/23/22 0514 03/24/22 0421  NA 139 142 140 143 141  K 3.4* 3.9 4.2 3.5 3.6  CL 107 111 114* 113* 110  CO2 26 22 14* 24 25  GLUCOSE 131* 140* 257* 119* 108*  BUN '13 15 16 21 14  '$ CREATININE 0.69 0.78 1.41* 1.41* 0.83  CALCIUM 9.4 8.8* 6.9* 7.3* 8.0*  MG  --  1.9  --  1.7  --   PHOS  --   --   --  3.5  --    GFR: Estimated Creatinine Clearance: 74.4 mL/min (by C-G formula based on SCr of 0.83 mg/dL).  Liver Function Tests: Recent Labs  Lab 03/20/22 0918 03/22/22 1151 03/22/22 1526 03/24/22 0421  AST 16 27 73* 177*  ALT 23 32 90* 380*  ALKPHOS 84 87 70 74  BILITOT 0.6 1.1 1.2 0.6  PROT 6.9 6.7 4.9* 5.3*  ALBUMIN 4.0 3.6 2.5* 2.9*   Recent Labs  Lab 03/22/22 1151  LIPASE 23   No results for input(s): AMMONIA in the last 168 hours. Coagulation Profile: Recent Labs  Lab 03/22/22 1630  INR 1.6*   Cardiac Enzymes: No results for input(s): CKTOTAL, CKMB, CKMBINDEX, TROPONINI in the last 168 hours. BNP (last 3 results) No results for input(s): PROBNP in the last 8760 hours. HbA1C: No results for input(s): HGBA1C in the last 72 hours. CBG: No results for input(s): GLUCAP in the last 168 hours. Lipid Profile: No results for input(s): CHOL, HDL, LDLCALC, TRIG, CHOLHDL, LDLDIRECT in the last 72 hours. Thyroid Function Tests: No results for input(s): TSH, T4TOTAL, FREET4, T3FREE, THYROIDAB in the last 72 hours. Anemia Panel: No results for input(s): VITAMINB12, FOLATE, FERRITIN, TIBC, IRON, RETICCTPCT in the last 72 hours. Sepsis Labs: Recent Labs  Lab 03/22/22 1151 03/22/22 1313 03/22/22 1526 03/22/22 1820 03/23/22 0514 03/24/22 0421  PROCALCITON  --   --  <0.10  --  2.36 0.66  LATICACIDVEN 2.0* 4.4* 8.2* 6.1*  --   --     Recent  Results (from the past 240 hour(s))  SARS Coronavirus 2 by RT PCR (hospital order, performed in [facility] hospital lab) *cepheid single result test* Anterior Nasal Swab     Status: None   Collection Time: 03/22/22 11:59 AM   Specimen: Anterior Nasal Swab  Result Value Ref Range Status   SARS Coronavirus 2 by RT PCR NEGATIVE NEGATIVE Final    Comment: (NOTE) SARS-CoV-2 target nucleic acids are NOT DETECTED.  The SARS-CoV-2 RNA is generally detectable in upper and lower respiratory specimens during the acute phase of infection. The lowest concentration of SARS-CoV-2 viral copies this assay can detect is 250 copies / mL. A negative result does not preclude SARS-CoV-2 infection and should not be used as the sole basis for treatment or other patient management decisions.  A negative result may occur with improper specimen collection / handling, submission of specimen other than nasopharyngeal swab, presence of viral mutation(s) within the areas targeted by this assay, and inadequate number of viral copies (<250 copies / mL). A negative result must be combined with clinical observations, patient history, and epidemiological information.  Fact Sheet for Patients:   https://www.patel.info/  Fact Sheet for Healthcare Providers: https://hall.com/  This test is not yet approved or  cleared by the [place] FDA and has been authorized for detection and/or diagnosis of SARS-CoV-2 by FDA under an Emergency Use Authorization (EUA).  This EUA will remain in effect (meaning this test can be used) for the duration of the COVID-19 declaration under Section 564(b)(1) of the Act, 21 U.S.C. section 360bbb-3(b)(1), unless the authorization is terminated or revoked sooner.  Performed at [facility], [place] [address] [place], [place] [address]   Culture, blood (routine x 2)     Status: None (Preliminary result)   Collection Time:  03/22/22  1:12 PM   Specimen: BLOOD  Result Value Ref Range Status   Specimen Description   Final    BLOOD SITE NOT SPECIFIED Performed at [place] [address], [place],  [address]    Special Requests   Final    BOTTLES  DRAWN AEROBIC AND ANAEROBIC Blood Culture adequate volume Performed at [place] [address], [name], [place] [address]    Culture   Final    NO GROWTH 2 DAYS Performed at [place] [address], [place], [place] [address]    Report Status PENDING  Incomplete  Culture, blood (routine x 2)     Status: None (Preliminary result)   Collection Time: 03/22/22  1:12 PM   Specimen: BLOOD  Result Value Ref Range Status   Specimen Description   Final    BLOOD SITE NOT SPECIFIED Performed at [place] [address] [place], [place] [address]    Special Requests   Final    BOTTLES DRAWN AEROBIC AND ANAEROBIC Blood Culture results may not be optimal due to an inadequate volume of blood received in culture bottles Performed at [place] [address], [place], [place] [address]    Culture   Final    NO GROWTH 2 DAYS Performed at [place] [address] [place], [place] [address]    Report Status PENDING  Incomplete  MRSA Next Gen by PCR, Nasal     Status: None   Collection Time: 03/22/22  5:58 PM   Specimen: Nasal Mucosa; Nasal Swab  Result Value Ref Range Status   MRSA by PCR Next Gen NOT DETECTED NOT DETECTED Final    Comment: (NOTE) The GeneXpert MRSA Assay (FDA approved for NASAL specimens only), is one component of a comprehensive MRSA colonization surveillance program. It is not intended to diagnose MRSA infection nor to guide or monitor treatment for MRSA infections. Test performance is not FDA approved in patients less than 54 years old. Performed at [facility], [place] [address], [place], [place] [address]    Urine Culture     Status: None   Collection Time: 03/22/22  6:45 PM   Specimen: Urine, Clean Catch  Result Value Ref Range Status   Specimen Description   Final    URINE, CLEAN CATCH Performed at [facility], [place] [address], [place], [place] [address]    Special Requests   Final    NONE Performed at [facility], [place] [address], [place], [place] [address]    Culture   Final    NO GROWTH Performed at [facility], [place] [address], [place], [place] [address]    Report Status 03/23/2022 FINAL  Final         Radiology Studies: CT Angio Chest PE W/Cm &/Or Wo Cm  Result Date: 03/22/2022 CLINICAL DATA:  Shortness of breath, acute generalized abdominal pain. EXAM: CT ANGIOGRAPHY CHEST CT ABDOMEN AND PELVIS WITH CONTRAST TECHNIQUE: Multidetector CT imaging of the chest was performed using the standard protocol during bolus administration of intravenous contrast. Multiplanar CT image reconstructions and MIPs were obtained to evaluate the vascular anatomy. Multidetector CT imaging of the abdomen and pelvis was performed using the standard protocol during bolus administration of intravenous contrast. RADIATION DOSE REDUCTION: This exam was performed according to the departmental dose-optimization program which includes automated exposure control, adjustment of the mA and/or kV according to patient size and/or use of iterative reconstruction technique. CONTRAST:  38m OMNIPAQUE IOHEXOL 350 MG/ML SOLN COMPARISON:  November 23, 2021. FINDINGS: CTA CHEST FINDINGS Cardiovascular: Large bilateral pulmonary emboli are noted. RV/LV ratio of 2.5 is noted suggesting right heart strain. No pericardial effusion is noted. Mediastinum/Nodes: Thyroid gland is unremarkable. Esophagus  is unremarkable. No mediastinal adenopathy is noted. 14 mm left axillary lymph node is noted which is decreased compared to prior exam. Lungs/Pleura: No pneumothorax or pleural effusion is  noted. Left lung is clear. Mild right posterior basilar subsegmental atelectasis is noted. Musculoskeletal: No significant osseous abnormality is noted. However, there is seen soft tissue gas in the cervical soft tissues of unknown etiology. Review of the MIP images confirms the above findings. CT ABDOMEN and PELVIS FINDINGS Hepatobiliary: No cholelithiasis is noted, but moderate gallbladder wall thickening is noted concerning for cholecystitis or potentially due to adjacent hepatocellular disease. Periportal low densities are noted suggesting possible hepatitis. Stable 12 mm low density seen in dome of left hepatic lobe most consistent with cyst. Heterogeneous appearance of hepatic parenchyma is noted. Pancreas: Mild amount of inflammatory stranding is noted around the pancreas suggesting pancreatitis. No pseudocyst formation is noted. Spleen: Normal in size without focal abnormality. Adrenals/Urinary Tract: Adrenal glands are unremarkable. Kidneys are normal, without renal calculi, focal lesion, or hydronephrosis. Bladder is unremarkable. Stomach/Bowel: The stomach appears normal. The appendix appears normal. There is no evidence of bowel obstruction or inflammation. Large amount of stool is noted in the rectum concerning for impaction. Vascular/Lymphatic: No significant vascular findings are present. No enlarged abdominal or pelvic lymph nodes. Reproductive: Uterus and bilateral adnexa are unremarkable. Other: Small amount of free fluid or ascites is noted in the pelvis. No hernia is noted. Musculoskeletal: No acute or significant osseous findings. Review of the MIP images confirms the above findings. IMPRESSION: Large bilateral pulmonary emboli are noted. Positive for acute PE with CT evidence of right heart strain (RV/LV Ratio = 2.5) consistent with at least submassive (intermediate risk) PE. The presence of right heart strain has been associated with an increased risk of morbidity and mortality. Please refer  to the "Code PE Focused" order set in EPIC. Critical Value/emergent results were called by telephone at the time of interpretation on 03/22/2022 at 4:23 pm to provider Farrell Ours, RN, who verbally acknowledged these results and stated that they were already aware of this diagnosis. 14 mm left axillary lymph node is noted which is decreased compared to prior exam. Small foci of soft tissue gas are noted in the cervical soft tissues anteriorly of uncertain etiology. Infection cannot be excluded. Moderate gallbladder wall thickening is noted without cholelithiasis. It is uncertain if this represents cholecystitis, or potentially be secondary to adjacent hepatocellular disease. The liver is somewhat heterogeneous in appearance with periportal lucencies suggesting possible hepatitis. Mild inflammatory stranding is noted around the pancreas suggesting pancreatitis. Small amount of ascites is noted in the pelvis. Large amount of stool is noted in the rectum concerning for impaction. Electronically Signed   By: Marijo Conception M.D.   On: 03/22/2022 16:24   CT Abdomen Pelvis W Contrast  Result Date: 03/22/2022 CLINICAL DATA:  Shortness of breath, acute generalized abdominal pain. EXAM: CT ANGIOGRAPHY CHEST CT ABDOMEN AND PELVIS WITH CONTRAST TECHNIQUE: Multidetector CT imaging of the chest was performed using the standard protocol during bolus administration of intravenous contrast. Multiplanar CT image reconstructions and MIPs were obtained to evaluate the vascular anatomy. Multidetector CT imaging of the abdomen and pelvis was performed using the standard protocol during bolus administration of intravenous contrast. RADIATION DOSE REDUCTION: This exam was performed according to the departmental dose-optimization program which includes automated exposure control, adjustment of the mA and/or kV according to patient size and/or use of iterative reconstruction technique. CONTRAST:  20m OMNIPAQUE IOHEXOL 350 MG/ML SOLN  COMPARISON:  November 23, 2021. FINDINGS: CTA CHEST FINDINGS Cardiovascular: Large bilateral pulmonary emboli are noted. RV/LV ratio of 2.5 is noted suggesting right heart strain. No pericardial effusion is noted. Mediastinum/Nodes: Thyroid gland is unremarkable. Esophagus is unremarkable. No mediastinal adenopathy is noted. 14 mm left axillary lymph node is noted which is decreased compared to prior exam. Lungs/Pleura: No pneumothorax or pleural effusion is noted. Left lung is clear. Mild right posterior basilar subsegmental atelectasis is noted. Musculoskeletal: No significant osseous abnormality is noted. However, there is seen soft tissue gas in the cervical soft tissues of unknown etiology. Review of the MIP images confirms the above findings. CT ABDOMEN and PELVIS FINDINGS Hepatobiliary: No cholelithiasis is noted, but moderate gallbladder wall thickening is noted concerning for cholecystitis or potentially due to adjacent hepatocellular disease. Periportal low densities are noted suggesting possible hepatitis. Stable 12 mm low density seen in dome of left hepatic lobe most consistent with cyst. Heterogeneous appearance of hepatic parenchyma is noted. Pancreas: Mild amount of inflammatory stranding is noted around the pancreas suggesting pancreatitis. No pseudocyst formation is noted. Spleen: Normal in size without focal abnormality. Adrenals/Urinary Tract: Adrenal glands are unremarkable. Kidneys are normal, without renal calculi, focal lesion, or hydronephrosis. Bladder is unremarkable. Stomach/Bowel: The stomach appears normal. The appendix appears normal. There is no evidence of bowel obstruction or inflammation. Large amount of stool is noted in the rectum concerning for impaction. Vascular/Lymphatic: No significant vascular findings are present. No enlarged abdominal or pelvic lymph nodes. Reproductive: Uterus and bilateral adnexa are unremarkable. Other: Small amount of free fluid or ascites is noted in  the pelvis. No hernia is noted. Musculoskeletal: No acute or significant osseous findings. Review of the MIP images confirms the above findings. IMPRESSION: Large bilateral pulmonary emboli are noted. Positive for acute PE with CT evidence of right heart strain (RV/LV Ratio = 2.5) consistent with at least submassive (intermediate risk) PE. The presence of right heart strain has been associated with an increased risk of morbidity and mortality. Please refer to the "Code PE Focused" order set in EPIC. Critical Value/emergent results were called by telephone at the time of interpretation on 03/22/2022 at 4:23 pm to provider Farrell Ours, RN, who verbally acknowledged these results and stated that they were already aware of this diagnosis. 14 mm left axillary lymph node is noted which is decreased compared to prior exam. Small foci of soft tissue gas are noted in the cervical soft tissues anteriorly of uncertain etiology. Infection cannot be excluded. Moderate gallbladder wall thickening is noted without cholelithiasis. It is uncertain if this represents cholecystitis, or potentially be secondary to adjacent hepatocellular disease. The liver is somewhat heterogeneous in appearance with periportal lucencies suggesting possible hepatitis. Mild inflammatory stranding is noted around the pancreas suggesting pancreatitis. Small amount of ascites is noted in the pelvis. Large amount of stool is noted in the rectum concerning for impaction. Electronically Signed   By: Marijo Conception M.D.   On: 03/22/2022 16:24   DG Chest Port 1 View  Result Date: 03/22/2022 CLINICAL DATA:  Shortness of breath, increased weakness, had chemotherapy on Tuesday, history breast cancer EXAM: PORTABLE CHEST 1 VIEW COMPARISON:  Portable exam 1210 hours compared to 12/07/2021 FINDINGS: RIGHT jugular Port-A-Cath with tip projecting over RIGHT atrium. Borderline enlargement of cardiac silhouette. Mediastinal contours and pulmonary vascularity normal.  Lungs clear. No infiltrate, pleural effusion, or pneumothorax. IMPRESSION: No acute abnormalities. Electronically Signed   By: Lavonia Dana M.D.   On: 03/22/2022 12:16  ECHOCARDIOGRAM COMPLETE  Result Date: 03/23/2022    ECHOCARDIOGRAM REPORT   Patient Name:   Cassandra Brennan Date of Exam: 03/23/2022 Medical Rec #:  [id number]      Height:       65.0 in Accession #:    [id number]     Weight:       190.7 lb Date of Birth:  1956-06-24      BSA:          1.938 m Patient Age:    51 years       BP:           148/92 mmHg Patient Gender: F              HR:           99 bpm. Exam Location:  Inpatient Procedure: 2D Echo, Cardiac Doppler, Color Doppler and Strain Analysis Indications:    Pulm embolus/ chemo  History:        Patient has prior history of Echocardiogram examinations, most                 recent 11/22/2021. Signs/Symptoms:Dyspnea; Risk                 Factors:Dyslipidemia.  Sonographer:    Luisa Hart RDCS Referring Phys: [place]  Sonographer Comments: Technically challenging study due to limited acoustic windows. Image acquisition challenging due to patient body habitus. IMPRESSIONS  1. Left ventricular ejection fraction, by estimation, is 50 to 55%. The left ventricle has low normal function. The left ventricle has no regional wall motion abnormalities. Left ventricular diastolic parameters are indeterminate.  2. Right ventricular systolic function is normal. The right ventricular size is normal.  3. There's a small mobile mass on the anterior mitral valve leaflet (image 4) ddx includes calcification vs vegetation. No evidence of mitral valve regurgitation.  4. The aortic valve is grossly normal. Aortic valve regurgitation is not visualized.  5. Aortic no significant ascending aortic aneurysm. Conclusion(s)/Recommendation(s): There's a small mobile mass on the anterior mitral valve leaflet (image 4) ddx includes calcification vs vegetation. Recommend follow up TEE. FINDINGS  Left Ventricle: Left  ventricular ejection fraction, by estimation, is 50 to 55%. The left ventricle has low normal function. The left ventricle has no regional wall motion abnormalities. The left ventricular internal cavity size was normal in size. There is no left ventricular hypertrophy. Left ventricular diastolic parameters are indeterminate. Right Ventricle: The right ventricular size is normal. Right ventricular systolic function is normal. Left Atrium: Left atrial size was normal in size. Right Atrium: Right atrial size was normal in size. Pericardium: There is no evidence of pericardial effusion. Mitral Valve: There's a small mobile mass on the anterior mitral valve leaflet (image 4) ddx includes calcification vs vegetation. No evidence of mitral valve regurgitation. Tricuspid Valve: The tricuspid valve is normal in structure. Tricuspid valve regurgitation is trivial. Aortic Valve: The aortic valve is grossly normal. Aortic valve regurgitation is not visualized. Aortic valve mean gradient measures 4.0 mmHg. Aortic valve peak gradient measures 8.0 mmHg. Aortic valve area, by VTI measures 2.04 cm. Pulmonic Valve: The pulmonic valve was normal in structure. Pulmonic valve regurgitation is trivial. Aorta: No significant ascending aortic aneurysm.  LEFT VENTRICLE PLAX 2D LVIDd:         4.30 cm     Diastology LVIDs:         3.50 cm     LV e' medial:    9.36 cm/s  LV PW:         0.90 cm     LV E/e' medial:  8.4 LV IVS:        0.80 cm     LV e' lateral:   12.00 cm/s LVOT diam:     1.90 cm     LV E/e' lateral: 6.6 LV SV:         46 LV SV Index:   24 LVOT Area:     2.84 cm  LV Volumes (MOD) LV vol d, MOD A2C: 64.7 ml LV vol d, MOD A4C: 50.2 ml LV vol s, MOD A2C: 38.3 ml LV vol s, MOD A4C: 29.2 ml LV SV MOD A2C:     26.4 ml LV SV MOD A4C:     50.2 ml LV SV MOD BP:      24.2 ml RIGHT VENTRICLE RV Basal diam:  3.80 cm RV Mid diam:    2.20 cm RV S prime:     12.00 cm/s TAPSE (M-mode): 1.9 cm LEFT ATRIUM             Index        RIGHT ATRIUM            Index LA diam:        2.30 cm 1.19 cm/m   RA Area:     14.90 cm LA Vol (A2C):   43.5 ml 22.44 ml/m  RA Volume:   36.60 ml  18.88 ml/m LA Vol (A4C):   47.2 ml 24.35 ml/m LA Biplane Vol: 46.6 ml 24.04 ml/m  AORTIC VALVE                    PULMONIC VALVE AV Area (Vmax):    1.98 cm     PV Vmax:          0.93 m/s AV Area (Vmean):   1.90 cm     PV Vmean:         63.550 cm/s AV Area (VTI):     2.04 cm     PV VTI:           0.152 m AV Vmax:           141.50 cm/s  PV Peak grad:     3.4 mmHg AV Vmean:          97.700 cm/s  PV Mean grad:     2.0 mmHg AV VTI:            0.224 m      PR End Diast Vel: 7.29 msec AV Peak Grad:      8.0 mmHg AV Mean Grad:      4.0 mmHg LVOT Vmax:         99.00 cm/s LVOT Vmean:        65.600 cm/s LVOT VTI:          0.161 m LVOT/AV VTI ratio: 0.72  AORTA Ao Root diam: 2.60 cm Ao Asc diam:  3.70 cm MITRAL VALVE               TRICUSPID VALVE MV Area (PHT): 4.17 cm    TR Peak grad:   31.6 mmHg MV Decel Time: 182 msec    TR Vmax:        281.00 cm/s MV E velocity: 78.80 cm/s MV A velocity: 95.20 cm/s  SHUNTS MV E/A ratio:  0.83        Systemic VTI:  0.16 m  Systemic Diam: 1.90 cm Phineas Inches Electronically signed by Phineas Inches Signature Date/Time: 03/23/2022/12:04:34 PM    Final         Scheduled Meds:  apixaban  10 mg Oral BID   Followed by   Derrill Memo ON 03/31/2022] apixaban  5 mg Oral BID   cefdinir  300 mg Oral Q12H   Chlorhexidine Gluconate Cloth  6 each Topical Q0600   mouth rinse  15 mL Mouth Rinse BID   metoprolol tartrate  50 mg Oral BID   sodium chloride flush  10-40 mL Intracatheter Q12H   Continuous Infusions:        Aline August, MD [facility] 03/24/2022, 11:42 AM

## 2022-03-25 ENCOUNTER — Encounter: Payer: Self-pay | Admitting: Nurse Practitioner

## 2022-03-25 DIAGNOSIS — D649 Anemia, unspecified: Secondary | ICD-10-CM

## 2022-03-25 DIAGNOSIS — I82401 Acute embolism and thrombosis of unspecified deep veins of right lower extremity: Secondary | ICD-10-CM

## 2022-03-25 DIAGNOSIS — R778 Other specified abnormalities of plasma proteins: Secondary | ICD-10-CM

## 2022-03-25 LAB — COMPREHENSIVE METABOLIC PANEL
ALT: 258 U/L — ABNORMAL HIGH (ref 0–44)
AST: 70 U/L — ABNORMAL HIGH (ref 15–41)
Albumin: 3.1 g/dL — ABNORMAL LOW (ref 3.5–5.0)
Alkaline Phosphatase: 74 U/L (ref 38–126)
Anion gap: 5 (ref 5–15)
BUN: 11 mg/dL (ref 8–23)
CO2: 26 mmol/L (ref 22–32)
Calcium: 8.4 mg/dL — ABNORMAL LOW (ref 8.9–10.3)
Chloride: 110 mmol/L (ref 98–111)
Creatinine, Ser: 0.78 mg/dL (ref 0.44–1.00)
GFR, Estimated: >60 mL/min (ref >60)
Glucose, Bld: 103 mg/dL — ABNORMAL HIGH (ref 70–99)
Potassium: 3.6 mmol/L (ref 3.5–5.1)
Sodium: 141 mmol/L (ref 135–145)
Total Bilirubin: 0.8 mg/dL (ref 0.3–1.2)
Total Protein: 5.4 g/dL — ABNORMAL LOW (ref 6.5–8.1)

## 2022-03-25 LAB — MAGNESIUM: Magnesium: 1.9 mg/dL (ref 1.7–2.4)

## 2022-03-25 LAB — CBC WITH DIFFERENTIAL/PLATELET
Abs Immature Granulocytes: 0.05 10*3/uL (ref 0.00–0.07)
Basophils Absolute: 0 10*3/uL (ref 0.0–0.1)
Basophils Relative: 1 %
Eosinophils Absolute: 0.1 10*3/uL (ref 0.0–0.5)
Eosinophils Relative: 3 %
HCT: 22.7 % — ABNORMAL LOW (ref 36.0–46.0)
Hemoglobin: 7.2 g/dL — ABNORMAL LOW (ref 12.0–15.0)
Immature Granulocytes: 1 %
Lymphocytes Relative: 17 %
Lymphs Abs: 0.6 10*3/uL — ABNORMAL LOW (ref 0.7–4.0)
MCH: 27.8 pg (ref 26.0–34.0)
MCHC: 31.7 g/dL (ref 30.0–36.0)
MCV: 87.6 fL (ref 80.0–100.0)
Monocytes Absolute: 0.2 10*3/uL (ref 0.1–1.0)
Monocytes Relative: 5 %
Neutro Abs: 2.6 10*3/uL (ref 1.7–7.7)
Neutrophils Relative %: 73 %
Platelets: 212 10*3/uL (ref 150–400)
RBC: 2.59 MIL/uL — ABNORMAL LOW (ref 3.87–5.11)
RDW: 18.3 % — ABNORMAL HIGH (ref 11.5–15.5)
WBC: 3.5 10*3/uL — ABNORMAL LOW (ref 4.0–10.5)
nRBC: 0.6 % — ABNORMAL HIGH (ref 0.0–0.2)

## 2022-03-25 LAB — HEPATITIS PANEL, ACUTE
HCV Ab: NONREACTIVE
Hep A IgM: NONREACTIVE
Hep B C IgM: NONREACTIVE
Hepatitis B Surface Ag: NONREACTIVE

## 2022-03-25 MED ORDER — CEFDINIR 300 MG PO CAPS: 300.0000 mg | ORAL_CAPSULE | Freq: Two times a day (BID) | ORAL | 0 refills | Status: AC

## 2022-03-25 MED ORDER — HEPARIN SOD (PORK) LOCK FLUSH 100 UNIT/ML IV SOLN
500.0000 [IU] | INTRAVENOUS | Status: AC | PRN
Start: 2022-03-25 — End: 2022-03-25
  Administered 2022-03-25: 500 [IU]

## 2022-03-25 MED ORDER — FUROSEMIDE 10 MG/ML IJ SOLN
60.0000 mg | Freq: Once | INTRAMUSCULAR | Status: AC
  Administered 2022-03-25: 60 mg via INTRAVENOUS
  Filled 2022-03-25: qty 6

## 2022-03-25 MED ORDER — APIXABAN 5 MG PO TABS
ORAL_TABLET | ORAL | 0 refills | Status: DC
Start: 2022-03-25 — End: 2022-04-17

## 2022-03-25 NOTE — Discharge Instructions (Signed)
Information on my medicine - ELIQUIS (apixaban)  Why was Eliquis prescribed for you? Eliquis was prescribed to treat blood clots that may have been found in the veins of your legs (deep vein thrombosis) or in your lungs (pulmonary embolism) and to reduce the risk of them occurring again.  What do You need to know about Eliquis ? The starting dose is 10 mg (two 5 mg tablets) taken TWICE daily for the FIRST SEVEN (7) DAYS, then on 03/31/2022, the dose is reduced to ONE 5 mg tablet taken TWICE daily.  Eliquis may be taken with or without food.   Try to take the dose about the same time in the morning and in the evening. If you have difficulty swallowing the tablet whole please discuss with your pharmacist how to take the medication safely.  Take Eliquis exactly as prescribed and DO NOT stop taking Eliquis without talking to the doctor who prescribed the medication.  Stopping may increase your risk of developing a new blood clot.  Refill your prescription before you run out.  After discharge, you should have regular check-up appointments with your healthcare provider that is prescribing your Eliquis.    What do you do if you miss a dose? If a dose of ELIQUIS is not taken at the scheduled time, take it as soon as possible on the same day and twice-daily administration should be resumed. The dose should not be doubled to make up for a missed dose.  Important Safety Information A possible side effect of Eliquis is bleeding. You should call your healthcare provider right away if you experience any of the following: Bleeding from an injury or your nose that does not stop. Unusual colored urine (red or dark brown) or unusual colored stools (red or black). Unusual bruising for unknown reasons. A serious fall or if you hit your head (even if there is no bleeding).  Some medicines may interact with Eliquis and might increase your risk of bleeding or clotting while on Eliquis. To help avoid  this, consult your healthcare provider or pharmacist prior to using any new prescription or non-prescription medications, including herbals, vitamins, non-steroidal anti-inflammatory drugs (NSAIDs) and supplements.  This website has more information on Eliquis (apixaban): http://[email or web address].com/eliquis/home

## 2022-03-25 NOTE — Discharge Summary (Signed)
Physician Discharge Summary  Cassandra Brennan IRC:[id number] DOB: 1956/07/17 DOA: 03/22/2022  PCP: Everardo Beals, NP  Admit date: 03/22/2022 Discharge date: 03/25/2022  Admitted From: Home Disposition: Home  Recommendations for Outpatient Follow-up:  Follow up with PCP in 1 week with repeat CBC/BMP Outpatient follow-up with oncology Follow up in ED if symptoms worsen or new appear   Home Health: No Equipment/Devices: None  Discharge Condition: Stable CODE STATUS: Full Diet recommendation: Heart healthy  Brief/Interim Summary: 66 year old female with history of breast cancer currently on chemotherapy, anxiety, GERD, hyperlipidemia, hypertension, anemia of chronic disease presented with weakness and fatigue.  On presentation, she was tachycardic, hypotensive with lactic acidosis.  She was initially started on IV fluids, pressors, broad-spectrum antibiotics and admitted to ICU under PCCM service.  She was found to have large bilateral pulmonary embolism with CT evidence of right heart strain along with moderate gallbladder wall thickening without cholelithiasis.  She received TNK with resolution of shock.  She was started on IV heparin.  IV antibiotics were switched to oral.  She was transferred to [facility] service from 03/24/2022 onwards.  Subsequently she was switched to oral Eliquis.  She has tolerated Eliquis well.  She is hemodynamically stable and currently on room air.  She feels okay to go home today.  She will be discharged home on oral Eliquis.  Outpatient follow-up with PCP and oncology.  Discharge Diagnoses:   Acute massive pulmonary embolism: Present on admission Right common femoral vein DVT, age indeterminate Obstructive shock and acute respiratory failure: Present on admission, now resolved -Patient received TNK with resolution of shock and was subsequently started on IV heparin.  Off pressors.  Respiratory status has improved and currently on room air.  She was transferred to [facility]  service from 03/24/2022 onwards -Switched to oral Eliquis on 03/24/2022.  She has tolerated Eliquis well.  No signs of bleeding. lower extremity ultrasound duplex showed age-indeterminate right common femoral vein DVT -echo showed EF of 50 to 55% with indeterminate left ventricular diastolic parameters.  Small mobile mass on the anterior mitral valve leaflet seen which could be calcification versus vegetation.  Discussed with Dr. Stanton Kidney Branch/cardiology via secure chat on 03/24/2022 who thought that this could most likely be a calcification since blood cultures were negative.  Cardiology will arrange for outpatient 2D echo.   -Patient received a lot of IV fluids during hospitalization and is slightly fluid overloaded.  We will give 1 dose of IV Lasix prior to discharge. -Discharge patient home today on oral Eliquis.  Outpatient follow-up with PCP and oncology.  Right lower lobe pneumonia -Initially treated with broad-spectrum antibiotics.  Currently on oral cefdinir: Finish 5-day course of treatment.  Discharged home on 2 more days of oral cefdinir.   AKI -Resolved   Elevated LFTs -Possibly from shock liver.  Improving.  Outpatient follow-up. -Imaging had shown gallbladder wall thickening without cholelithiasis.  Patient does not have abdominal pain.     Chronic anemia -Hemoglobin 7.2 this morning.  Outpatient follow-up.  Might need transfusion as an outpatient if hemoglobin drops less than 7.  Breast cancer -Currently on chemotherapy.  Outpatient follow-up with oncology    Discharge Instructions  Discharge Instructions     Diet - low sodium heart healthy   Complete by: As directed    Increase activity slowly   Complete by: As directed       Allergies as of 03/25/2022       Reactions   Prednisone Hypertension   Rosuvastatin  Other reaction(s): Myalgias (Muscle Pain)   Other    Had a rxn to cortisone knee injection - had elevated BP and HR   Robitussin (alcohol Free) [guaifenesin]  Hives   Sulfa Antibiotics Hives        Medication List     STOP taking these medications    ALPRAZolam 0.25 MG tablet Commonly known as: XANAX       TAKE these medications    acetaminophen 500 MG tablet Commonly known as: TYLENOL Take 500 mg by mouth every 8 (eight) hours as needed for moderate pain.   apixaban 5 MG Tabs tablet Commonly known as: Eliquis Take 2 tablets ('10mg'$ ) twice daily for 11 doses, then 1 tablet ('5mg'$ ) twice daily   cefdinir 300 MG capsule Commonly known as: OMNICEF Take 1 capsule (300 mg total) by mouth every 12 (twelve) hours for 2 days.   lidocaine-prilocaine cream Commonly known as: EMLA Apply to affected area once   metoprolol succinate 100 MG 24 hr tablet Commonly known as: TOPROL-XL Take 100 mg by mouth daily. Take with or immediately following a meal.   Multivitamin Adults Tabs Take 1 tablet by mouth daily.   ondansetron 8 MG tablet Commonly known as: Zofran Take 1 tablet (8 mg total) by mouth 2 (two) times daily as needed. Start on the third day after chemotherapy. What changed: reasons to take this   potassium chloride 10 MEQ tablet Commonly known as: KLOR-CON M Take 1 tablet (10 mEq total) by mouth daily.   pravastatin 20 MG tablet Commonly known as: PRAVACHOL Take 1 tablet (20 mg total) by mouth daily.   prochlorperazine 10 MG tablet Commonly known as: COMPAZINE Take 1 tablet (10 mg total) by mouth every 6 (six) hours as needed (Nausea or vomiting).        Follow-up Information     Everardo Beals, NP. Schedule an appointment as soon as possible for a visit in 1 week(s).   Why: with repeat cbc/bmp Contact information: [place] [address] [phone]                Allergies  Allergen Reactions   Prednisone Hypertension   Rosuvastatin     Other reaction(s): Myalgias (Muscle Pain)   Other     Had a rxn to cortisone knee injection - had elevated BP and HR   Robitussin (Alcohol  Free) [Guaifenesin] Hives   Sulfa Antibiotics Hives    Consultations: PCCM Discussed with Dr. Branch/cardiology via secure chat   Procedures/Studies: CT Angio Chest PE W/Cm &/Or Wo Cm  Result Date: 03/22/2022 CLINICAL DATA:  Shortness of breath, acute generalized abdominal pain. EXAM: CT ANGIOGRAPHY CHEST CT ABDOMEN AND PELVIS WITH CONTRAST TECHNIQUE: Multidetector CT imaging of the chest was performed using the standard protocol during bolus administration of intravenous contrast. Multiplanar CT image reconstructions and MIPs were obtained to evaluate the vascular anatomy. Multidetector CT imaging of the abdomen and pelvis was performed using the standard protocol during bolus administration of intravenous contrast. RADIATION DOSE REDUCTION: This exam was performed according to the departmental dose-optimization program which includes automated exposure control, adjustment of the mA and/or kV according to patient size and/or use of iterative reconstruction technique. CONTRAST:  53m OMNIPAQUE IOHEXOL 350 MG/ML SOLN COMPARISON:  November 23, 2021. FINDINGS: CTA CHEST FINDINGS Cardiovascular: Large bilateral pulmonary emboli are noted. RV/LV ratio of 2.5 is noted suggesting right heart strain. No pericardial effusion is noted. Mediastinum/Nodes: Thyroid gland is unremarkable. Esophagus is unremarkable. No mediastinal adenopathy  is noted. 14 mm left axillary lymph node is noted which is decreased compared to prior exam. Lungs/Pleura: No pneumothorax or pleural effusion is noted. Left lung is clear. Mild right posterior basilar subsegmental atelectasis is noted. Musculoskeletal: No significant osseous abnormality is noted. However, there is seen soft tissue gas in the cervical soft tissues of unknown etiology. Review of the MIP images confirms the above findings. CT ABDOMEN and PELVIS FINDINGS Hepatobiliary: No cholelithiasis is noted, but moderate gallbladder wall thickening is noted concerning for  cholecystitis or potentially due to adjacent hepatocellular disease. Periportal low densities are noted suggesting possible hepatitis. Stable 12 mm low density seen in dome of left hepatic lobe most consistent with cyst. Heterogeneous appearance of hepatic parenchyma is noted. Pancreas: Mild amount of inflammatory stranding is noted around the pancreas suggesting pancreatitis. No pseudocyst formation is noted. Spleen: Normal in size without focal abnormality. Adrenals/Urinary Tract: Adrenal glands are unremarkable. Kidneys are normal, without renal calculi, focal lesion, or hydronephrosis. Bladder is unremarkable. Stomach/Bowel: The stomach appears normal. The appendix appears normal. There is no evidence of bowel obstruction or inflammation. Large amount of stool is noted in the rectum concerning for impaction. Vascular/Lymphatic: No significant vascular findings are present. No enlarged abdominal or pelvic lymph nodes. Reproductive: Uterus and bilateral adnexa are unremarkable. Other: Small amount of free fluid or ascites is noted in the pelvis. No hernia is noted. Musculoskeletal: No acute or significant osseous findings. Review of the MIP images confirms the above findings. IMPRESSION: Large bilateral pulmonary emboli are noted. Positive for acute PE with CT evidence of right heart strain (RV/LV Ratio = 2.5) consistent with at least submassive (intermediate risk) PE. The presence of right heart strain has been associated with an increased risk of morbidity and mortality. Please refer to the "Code PE Focused" order set in EPIC. Critical Value/emergent results were called by telephone at the time of interpretation on 03/22/2022 at 4:23 pm to provider Farrell Ours, RN, who verbally acknowledged these results and stated that they were already aware of this diagnosis. 14 mm left axillary lymph node is noted which is decreased compared to prior exam. Small foci of soft tissue gas are noted in the cervical soft tissues  anteriorly of uncertain etiology. Infection cannot be excluded. Moderate gallbladder wall thickening is noted without cholelithiasis. It is uncertain if this represents cholecystitis, or potentially be secondary to adjacent hepatocellular disease. The liver is somewhat heterogeneous in appearance with periportal lucencies suggesting possible hepatitis. Mild inflammatory stranding is noted around the pancreas suggesting pancreatitis. Small amount of ascites is noted in the pelvis. Large amount of stool is noted in the rectum concerning for impaction. Electronically Signed   By: Marijo Conception M.D.   On: 03/22/2022 16:24   CT Abdomen Pelvis W Contrast  Result Date: 03/22/2022 CLINICAL DATA:  Shortness of breath, acute generalized abdominal pain. EXAM: CT ANGIOGRAPHY CHEST CT ABDOMEN AND PELVIS WITH CONTRAST TECHNIQUE: Multidetector CT imaging of the chest was performed using the standard protocol during bolus administration of intravenous contrast. Multiplanar CT image reconstructions and MIPs were obtained to evaluate the vascular anatomy. Multidetector CT imaging of the abdomen and pelvis was performed using the standard protocol during bolus administration of intravenous contrast. RADIATION DOSE REDUCTION: This exam was performed according to the departmental dose-optimization program which includes automated exposure control, adjustment of the mA and/or kV according to patient size and/or use of iterative reconstruction technique. CONTRAST:  84m OMNIPAQUE IOHEXOL 350 MG/ML SOLN COMPARISON:  November 23, 2021. FINDINGS:  CTA CHEST FINDINGS Cardiovascular: Large bilateral pulmonary emboli are noted. RV/LV ratio of 2.5 is noted suggesting right heart strain. No pericardial effusion is noted. Mediastinum/Nodes: Thyroid gland is unremarkable. Esophagus is unremarkable. No mediastinal adenopathy is noted. 14 mm left axillary lymph node is noted which is decreased compared to prior exam. Lungs/Pleura: No pneumothorax  or pleural effusion is noted. Left lung is clear. Mild right posterior basilar subsegmental atelectasis is noted. Musculoskeletal: No significant osseous abnormality is noted. However, there is seen soft tissue gas in the cervical soft tissues of unknown etiology. Review of the MIP images confirms the above findings. CT ABDOMEN and PELVIS FINDINGS Hepatobiliary: No cholelithiasis is noted, but moderate gallbladder wall thickening is noted concerning for cholecystitis or potentially due to adjacent hepatocellular disease. Periportal low densities are noted suggesting possible hepatitis. Stable 12 mm low density seen in dome of left hepatic lobe most consistent with cyst. Heterogeneous appearance of hepatic parenchyma is noted. Pancreas: Mild amount of inflammatory stranding is noted around the pancreas suggesting pancreatitis. No pseudocyst formation is noted. Spleen: Normal in size without focal abnormality. Adrenals/Urinary Tract: Adrenal glands are unremarkable. Kidneys are normal, without renal calculi, focal lesion, or hydronephrosis. Bladder is unremarkable. Stomach/Bowel: The stomach appears normal. The appendix appears normal. There is no evidence of bowel obstruction or inflammation. Large amount of stool is noted in the rectum concerning for impaction. Vascular/Lymphatic: No significant vascular findings are present. No enlarged abdominal or pelvic lymph nodes. Reproductive: Uterus and bilateral adnexa are unremarkable. Other: Small amount of free fluid or ascites is noted in the pelvis. No hernia is noted. Musculoskeletal: No acute or significant osseous findings. Review of the MIP images confirms the above findings. IMPRESSION: Large bilateral pulmonary emboli are noted. Positive for acute PE with CT evidence of right heart strain (RV/LV Ratio = 2.5) consistent with at least submassive (intermediate risk) PE. The presence of right heart strain has been associated with an increased risk of morbidity and  mortality. Please refer to the "Code PE Focused" order set in EPIC. Critical Value/emergent results were called by telephone at the time of interpretation on 03/22/2022 at 4:23 pm to provider Farrell Ours, RN, who verbally acknowledged these results and stated that they were already aware of this diagnosis. 14 mm left axillary lymph node is noted which is decreased compared to prior exam. Small foci of soft tissue gas are noted in the cervical soft tissues anteriorly of uncertain etiology. Infection cannot be excluded. Moderate gallbladder wall thickening is noted without cholelithiasis. It is uncertain if this represents cholecystitis, or potentially be secondary to adjacent hepatocellular disease. The liver is somewhat heterogeneous in appearance with periportal lucencies suggesting possible hepatitis. Mild inflammatory stranding is noted around the pancreas suggesting pancreatitis. Small amount of ascites is noted in the pelvis. Large amount of stool is noted in the rectum concerning for impaction. Electronically Signed   By: Marijo Conception M.D.   On: 03/22/2022 16:24   DG Chest Port 1 View  Result Date: 03/22/2022 CLINICAL DATA:  Shortness of breath, increased weakness, had chemotherapy on Tuesday, history breast cancer EXAM: PORTABLE CHEST 1 VIEW COMPARISON:  Portable exam 1210 hours compared to 12/07/2021 FINDINGS: RIGHT jugular Port-A-Cath with tip projecting over RIGHT atrium. Borderline enlargement of cardiac silhouette. Mediastinal contours and pulmonary vascularity normal. Lungs clear. No infiltrate, pleural effusion, or pneumothorax. IMPRESSION: No acute abnormalities. Electronically Signed   By: Lavonia Dana M.D.   On: 03/22/2022 12:16   ECHOCARDIOGRAM COMPLETE  Result Date: 03/23/2022  ECHOCARDIOGRAM REPORT   Patient Name:   Cassandra Brennan Date of Exam: 03/23/2022 Medical Rec #:  [id number]      Height:       65.0 in Accession #:    [id number]     Weight:       190.7 lb Date of Birth:  [birth year]       BSA:          1.938 m Patient Age:    89 years       BP:           148/92 mmHg Patient Gender: F              HR:           99 bpm. Exam Location:  Inpatient Procedure: 2D Echo, Cardiac Doppler, Color Doppler and Strain Analysis Indications:    Pulm embolus/ chemo  History:        Patient has prior history of Echocardiogram examinations, most                 recent 11/22/2021. Signs/Symptoms:Dyspnea; Risk                 Factors:Dyslipidemia.  Sonographer:    Luisa Hart RDCS Referring Phys: [place]  Sonographer Comments: Technically challenging study due to limited acoustic windows. Image acquisition challenging due to patient body habitus. IMPRESSIONS  1. Left ventricular ejection fraction, by estimation, is 50 to 55%. The left ventricle has low normal function. The left ventricle has no regional wall motion abnormalities. Left ventricular diastolic parameters are indeterminate.  2. Right ventricular systolic function is normal. The right ventricular size is normal.  3. There's a small mobile mass on the anterior mitral valve leaflet (image 4) ddx includes calcification vs vegetation. No evidence of mitral valve regurgitation.  4. The aortic valve is grossly normal. Aortic valve regurgitation is not visualized.  5. Aortic no significant ascending aortic aneurysm. Conclusion(s)/Recommendation(s): There's a small mobile mass on the anterior mitral valve leaflet (image 4) ddx includes calcification vs vegetation. Recommend follow up TEE. FINDINGS  Left Ventricle: Left ventricular ejection fraction, by estimation, is 50 to 55%. The left ventricle has low normal function. The left ventricle has no regional wall motion abnormalities. The left ventricular internal cavity size was normal in size. There is no left ventricular hypertrophy. Left ventricular diastolic parameters are indeterminate. Right Ventricle: The right ventricular size is normal. Right ventricular systolic function is normal. Left Atrium:  Left atrial size was normal in size. Right Atrium: Right atrial size was normal in size. Pericardium: There is no evidence of pericardial effusion. Mitral Valve: There's a small mobile mass on the anterior mitral valve leaflet (image 4) ddx includes calcification vs vegetation. No evidence of mitral valve regurgitation. Tricuspid Valve: The tricuspid valve is normal in structure. Tricuspid valve regurgitation is trivial. Aortic Valve: The aortic valve is grossly normal. Aortic valve regurgitation is not visualized. Aortic valve mean gradient measures 4.0 mmHg. Aortic valve peak gradient measures 8.0 mmHg. Aortic valve area, by VTI measures 2.04 cm. Pulmonic Valve: The pulmonic valve was normal in structure. Pulmonic valve regurgitation is trivial. Aorta: No significant ascending aortic aneurysm.  LEFT VENTRICLE PLAX 2D LVIDd:         4.30 cm     Diastology LVIDs:         3.50 cm     LV e' medial:    9.36 cm/s LV PW:  0.90 cm     LV E/e' medial:  8.4 LV IVS:        0.80 cm     LV e' lateral:   12.00 cm/s LVOT diam:     1.90 cm     LV E/e' lateral: 6.6 LV SV:         46 LV SV Index:   24 LVOT Area:     2.84 cm  LV Volumes (MOD) LV vol d, MOD A2C: 64.7 ml LV vol d, MOD A4C: 50.2 ml LV vol s, MOD A2C: 38.3 ml LV vol s, MOD A4C: 29.2 ml LV SV MOD A2C:     26.4 ml LV SV MOD A4C:     50.2 ml LV SV MOD BP:      24.2 ml RIGHT VENTRICLE RV Basal diam:  3.80 cm RV Mid diam:    2.20 cm RV S prime:     12.00 cm/s TAPSE (M-mode): 1.9 cm LEFT ATRIUM             Index        RIGHT ATRIUM           Index LA diam:        2.30 cm 1.19 cm/m   RA Area:     14.90 cm LA Vol (A2C):   43.5 ml 22.44 ml/m  RA Volume:   36.60 ml  18.88 ml/m LA Vol (A4C):   47.2 ml 24.35 ml/m LA Biplane Vol: 46.6 ml 24.04 ml/m  AORTIC VALVE                    PULMONIC VALVE AV Area (Vmax):    1.98 cm     PV Vmax:          0.93 m/s AV Area (Vmean):   1.90 cm     PV Vmean:         63.550 cm/s AV Area (VTI):     2.04 cm     PV VTI:            0.152 m AV Vmax:           141.50 cm/s  PV Peak grad:     3.4 mmHg AV Vmean:          97.700 cm/s  PV Mean grad:     2.0 mmHg AV VTI:            0.224 m      PR End Diast Vel: 7.29 msec AV Peak Grad:      8.0 mmHg AV Mean Grad:      4.0 mmHg LVOT Vmax:         99.00 cm/s LVOT Vmean:        65.600 cm/s LVOT VTI:          0.161 m LVOT/AV VTI ratio: 0.72  AORTA Ao Root diam: 2.60 cm Ao Asc diam:  3.70 cm MITRAL VALVE               TRICUSPID VALVE MV Area (PHT): 4.17 cm    TR Peak grad:   31.6 mmHg MV Decel Time: 182 msec    TR Vmax:        281.00 cm/s MV E velocity: 78.80 cm/s MV A velocity: 95.20 cm/s  SHUNTS MV E/A ratio:  0.83        Systemic VTI:  0.16 m  Systemic Diam: 1.90 cm Phineas Inches Electronically signed by Phineas Inches Signature Date/Time: 03/23/2022/12:04:34 PM    Final    VAS [place] LOWER EXTREMITY VENOUS (DVT)  Result Date: 03/24/2022  Lower Venous DVT Study Patient Name:  MERRYN THAKER  Date of Exam:   03/24/2022 Medical Rec #: [id number]       Accession #:    [id number] Date of Birth: 25-Sep-1956       Patient Gender: F Patient Age:   77 years Exam Location:  [facility] Procedure:      VAS [place] LOWER EXTREMITY VENOUS (DVT) Referring Phys: Aline August --------------------------------------------------------------------------------  Indications: Pulmonary embolism.  Limitations: Poor ultrasound/tissue interface. Comparison Study: No prior study Performing Technologist: Maudry Mayhew MHA, RDMS, RVT, RDCS  Examination Guidelines: A complete evaluation includes B-mode imaging, spectral Doppler, color Doppler, and power Doppler as needed of all accessible portions of each vessel. Bilateral testing is considered an integral part of a complete examination. Limited examinations for reoccurring indications may be performed as noted. The reflux portion of the exam is performed with the patient in reverse Trendelenburg.   +---------+---------------+---------+-----------+----------+-----------------+ RIGHT    CompressibilityPhasicitySpontaneityPropertiesThrombus Aging    +---------+---------------+---------+-----------+----------+-----------------+ CFV      Partial        Yes      Yes                  Age Indeterminate +---------+---------------+---------+-----------+----------+-----------------+ SFJ      Full                                                           +---------+---------------+---------+-----------+----------+-----------------+ FV Prox  Full                                                           +---------+---------------+---------+-----------+----------+-----------------+ FV Mid   Full                                                           +---------+---------------+---------+-----------+----------+-----------------+ FV DistalFull                                                           +---------+---------------+---------+-----------+----------+-----------------+ PFV      Full                                                           +---------+---------------+---------+-----------+----------+-----------------+ POP      Full           Yes      Yes                                    +---------+---------------+---------+-----------+----------+-----------------+  PTV      Full                                                           +---------+---------------+---------+-----------+----------+-----------------+ PERO     Full                                                           +---------+---------------+---------+-----------+----------+-----------------+ EIV                              Yes        patent                      +---------+---------------+---------+-----------+----------+-----------------+   +---------+---------------+---------+-----------+----------+--------------+ LEFT      CompressibilityPhasicitySpontaneityPropertiesThrombus Aging +---------+---------------+---------+-----------+----------+--------------+ CFV      Full           Yes      Yes                                 +---------+---------------+---------+-----------+----------+--------------+ SFJ      Full                                                        +---------+---------------+---------+-----------+----------+--------------+ FV Prox  Full                                                        +---------+---------------+---------+-----------+----------+--------------+ FV Mid   Full                                                        +---------+---------------+---------+-----------+----------+--------------+ FV DistalFull                                                        +---------+---------------+---------+-----------+----------+--------------+ PFV      Full                                                        +---------+---------------+---------+-----------+----------+--------------+ POP      Full           Yes      Yes                                 +---------+---------------+---------+-----------+----------+--------------+  PTV      Full                                                        +---------+---------------+---------+-----------+----------+--------------+ PERO     Full                                                        +---------+---------------+---------+-----------+----------+--------------+     Summary: RIGHT: - Findings consistent with age indeterminate deep vein thrombosis involving the right common femoral vein. - No cystic structure found in the popliteal fossa.  LEFT: - There is no evidence of deep vein thrombosis in the lower extremity.  - No cystic structure found in the popliteal fossa.  *See table(s) above for measurements and observations. Electronically signed by Deitra Mayo MD on 03/24/2022 at 1:12:20  PM.    Final       Subjective: Patient seen and examined at bedside.  Feels better and wants to go home today.  Denies worsening shortness of breath, nausea or vomiting.  No black or bloody stools or blood in urine.  Discharge Exam: Vitals:   03/25/22 0607 03/25/22 1037  BP: (!) 164/80 (!) 156/99  Pulse: 96 (!) 103  Resp: 18   Temp: 98 F (36.7 C)   SpO2: 100%     General: Pt is alert, awake, not in acute distress.  Currently on room air. Cardiovascular: Intermittent mild tachycardia present; S1/S2 + Respiratory: bilateral decreased breath sounds at bases Abdominal: Soft, NT, ND, bowel sounds + Extremities: Bilateral lower extremity edema present; no cyanosis    The results of significant diagnostics from this hospitalization (including imaging, microbiology, ancillary and laboratory) are listed below for reference.     Microbiology: Recent Results (from the past 240 hour(s))  SARS Coronavirus 2 by RT PCR (hospital order, performed in [facility] hospital lab) *cepheid single result test* Anterior Nasal Swab     Status: None   Collection Time: 03/22/22 11:59 AM   Specimen: Anterior Nasal Swab  Result Value Ref Range Status   SARS Coronavirus 2 by RT PCR NEGATIVE NEGATIVE Final    Comment: (NOTE) SARS-CoV-2 target nucleic acids are NOT DETECTED.  The SARS-CoV-2 RNA is generally detectable in upper and lower respiratory specimens during the acute phase of infection. The lowest concentration of SARS-CoV-2 viral copies this assay can detect is 250 copies / mL. A negative result does not preclude SARS-CoV-2 infection and should not be used as the sole basis for treatment or other patient management decisions.  A negative result may occur with improper specimen collection / handling, submission of specimen other than nasopharyngeal swab, presence of viral mutation(s) within the areas targeted by this assay, and inadequate number of viral copies (<250 copies / mL). A negative  result must be combined with clinical observations, patient history, and epidemiological information.  Fact Sheet for Patients:   https://www.patel.info/  Fact Sheet for Healthcare Providers: https://hall.com/  This test is not yet approved or  cleared by the [place] FDA and has been authorized for detection and/or diagnosis of SARS-CoV-2 by FDA under an Emergency Use Authorization (EUA).  This EUA will remain in effect (  meaning this test can be used) for the duration of the COVID-19 declaration under Section 564(b)(1) of the Act, 21 U.S.C. section 360bbb-3(b)(1), unless the authorization is terminated or revoked sooner.  Performed at [facility], [place] [address] [place], [place] [address]   Culture, blood (routine x 2)     Status: None (Preliminary result)   Collection Time: 03/22/22  1:12 PM   Specimen: BLOOD  Result Value Ref Range Status   Specimen Description   Final    BLOOD SITE NOT SPECIFIED Performed at [facility] [address], [place], [place] [address]    Special Requests   Final    BOTTLES DRAWN AEROBIC AND ANAEROBIC Blood Culture adequate volume Performed at [facility] [address] [place], [place] [address]    Culture   Final    NO GROWTH 3 DAYS Performed at [facility], [place] [address], [place], [place] [address]    Report Status PENDING  Incomplete  Culture, blood (routine x 2)     Status: None (Preliminary result)   Collection Time: 03/22/22  1:12 PM   Specimen: BLOOD  Result Value Ref Range Status   Specimen Description   Final    BLOOD SITE NOT SPECIFIED Performed at [facility] [address], [place], [place] [address]    Special Requests   Final    BOTTLES DRAWN AEROBIC AND ANAEROBIC Blood Culture results may not be optimal due to an inadequate volume of blood received in culture bottles Performed at [facility] [address], [place], [place] [address]    Culture   Final    NO GROWTH 3 DAYS Performed at [facility], [place] [address], [place], [place] [address]    Report Status PENDING  Incomplete  MRSA Next Gen by PCR, Nasal     Status: None   Collection Time: 03/22/22  5:58 PM   Specimen: Nasal Mucosa; Nasal Swab  Result Value Ref Range Status   MRSA by PCR Next Gen NOT DETECTED NOT DETECTED Final    Comment: (NOTE) The GeneXpert MRSA Assay (FDA approved for NASAL specimens only), is one component of a comprehensive MRSA colonization surveillance program. It is not intended to diagnose MRSA infection nor to guide or monitor treatment for MRSA infections. Test performance is not FDA approved in patients less than 55 years old. Performed at [facility], [place] [address], [place], [place] [address]   Urine Culture     Status: None   Collection Time: 03/22/22  6:45 PM   Specimen: Urine, Clean Catch  Result Value Ref Range Status   Specimen Description   Final    URINE, CLEAN CATCH Performed at [facility], [place] [address] [place], [place] [address]    Special Requests   Final    NONE Performed at [facility], [place] [address] [place], [place] [address]    Culture   Final    NO GROWTH Performed at [facility], [place] [address], [place], [place] [address]    Report Status 03/23/2022 FINAL  Final     Labs: BNP (last 3 results) Recent Labs    03/22/22 1151  BNP 12.7   Basic Metabolic Panel: Recent Labs  Lab 03/22/22 1151 03/22/22 1526 03/23/22 0514 03/24/22 0421 03/25/22 0308  NA 142 140 143 141 141  K 3.9 4.2 3.5 3.6 3.6  CL 111 114*  113* 110 110  CO2 22 14* '24 25 26  '$ GLUCOSE 140* 257* 119* 108* 103*  BUN '15 16 21 14 11  '$ CREATININE 0.78 1.41* 1.41* 0.83 0.78  CALCIUM 8.8* 6.9* 7.3* 8.0* 8.4*  MG 1.9  --  1.7  --  1.9  PHOS  --   --  3.5  --   --    Liver Function  Tests: Recent Labs  Lab 03/20/22 0918 03/22/22 1151 03/22/22 1526 03/24/22 0421 03/25/22 0308  AST 16 27 73* 177* 70*  ALT 23 32 90* 380* 258*  ALKPHOS 84 87 70 74 74  BILITOT 0.6 1.1 1.2 0.6 0.8  PROT 6.9 6.7 4.9* 5.3* 5.4*  ALBUMIN 4.0 3.6 2.5* 2.9* 3.1*   Recent Labs  Lab 03/22/22 1151  LIPASE 23   No results for input(s): AMMONIA in the last 168 hours. CBC: Recent Labs  Lab 03/20/22 0918 03/20/22 0918 03/22/22 1151 03/22/22 1526 03/22/22 1630 03/23/22 0514 03/24/22 0421 03/25/22 0308  WBC 4.0   < > 5.2 6.0 6.6 6.1 4.2 3.5*  NEUTROABS 3.1  --  4.6  --   --   --   --  2.6  HGB 8.9*   < > 9.2* 9.0* 7.8* 7.6* 7.2* 7.2*  HCT 26.9*  --  28.4* 30.2* 25.9* 23.5* 22.4* 22.7*  MCV 84.3  --  86.9 93.5 91.5 86.1 86.8 87.6  PLT 243   < > 248 184 148* 170 178 212   < > = values in this interval not displayed.   Cardiac Enzymes: No results for input(s): CKTOTAL, CKMB, CKMBINDEX, TROPONINI in the last 168 hours. BNP: Invalid input(s): POCBNP CBG: No results for input(s): GLUCAP in the last 168 hours. D-Dimer No results for input(s): DDIMER in the last 72 hours. Hgb A1c No results for input(s): HGBA1C in the last 72 hours. Lipid Profile No results for input(s): CHOL, HDL, LDLCALC, TRIG, CHOLHDL, LDLDIRECT in the last 72 hours. Thyroid function studies No results for input(s): TSH, T4TOTAL, T3FREE, THYROIDAB in the last 72 hours.  Invalid input(s): FREET3 Anemia work up No results for input(s): VITAMINB12, FOLATE, FERRITIN, TIBC, IRON, RETICCTPCT in the last 72 hours. Urinalysis    Component Value Date/Time   COLORURINE YELLOW 03/22/2022 1845   APPEARANCEUR HAZY (A) 03/22/2022 1845   LABSPEC 1.017 03/22/2022 1845   PHURINE 8.0 03/22/2022 1845   GLUCOSEU 50 (A) 03/22/2022 1845   HGBUR SMALL (A) 03/22/2022 1845   BILIRUBINUR NEGATIVE 03/22/2022 1845   KETONESUR NEGATIVE 03/22/2022 1845   PROTEINUR >=300 (A) 03/22/2022 1845   NITRITE NEGATIVE 03/22/2022 1845    LEUKOCYTESUR LARGE (A) 03/22/2022 1845   Sepsis Labs Invalid input(s): PROCALCITONIN,  WBC,  LACTICIDVEN Microbiology Recent Results (from the past 240 hour(s))  SARS Coronavirus 2 by RT PCR (hospital order, performed in [facility] hospital lab) *cepheid single result test* Anterior Nasal Swab     Status: None   Collection Time: 03/22/22 11:59 AM   Specimen: Anterior Nasal Swab  Result Value Ref Range Status   SARS Coronavirus 2 by RT PCR NEGATIVE NEGATIVE Final    Comment: (NOTE) SARS-CoV-2 target nucleic acids are NOT DETECTED.  The SARS-CoV-2 RNA is generally detectable in upper and lower respiratory specimens during the acute phase of infection. The lowest concentration of SARS-CoV-2 viral copies this assay can detect is 250 copies / mL. A negative result does not preclude SARS-CoV-2 infection and should not be used as the sole basis for treatment or other patient management  decisions.  A negative result may occur with improper specimen collection / handling, submission of specimen other than nasopharyngeal swab, presence of viral mutation(s) within the areas targeted by this assay, and inadequate number of viral copies (<250 copies / mL). A negative result must be combined with clinical observations, patient history, and epidemiological information.  Fact Sheet for Patients:   https://www.patel.info/  Fact Sheet for Healthcare Providers: https://hall.com/  This test is not yet approved or  cleared by the [place] FDA and has been authorized for detection and/or diagnosis of SARS-CoV-2 by FDA under an Emergency Use Authorization (EUA).  This EUA will remain in effect (meaning this test can be used) for the duration of the COVID-19 declaration under Section 564(b)(1) of the Act, 21 U.S.C. section 360bbb-3(b)(1), unless the authorization is terminated or revoked sooner.  Performed at [facility], [place]  [address] [place], [place] [address]   Culture, blood (routine x 2)     Status: None (Preliminary result)   Collection Time: 03/22/22  1:12 PM   Specimen: BLOOD  Result Value Ref Range Status   Specimen Description   Final    BLOOD SITE NOT SPECIFIED Performed at [facility] [address], [place], [place] [address]    Special Requests   Final    BOTTLES DRAWN AEROBIC AND ANAEROBIC Blood Culture adequate volume Performed at [facility] [address], [place], [place] [address]    Culture   Final    NO GROWTH 3 DAYS Performed at [facility], [place] [address], [place], [place] [address]    Report Status PENDING  Incomplete  Culture, blood (routine x 2)     Status: None (Preliminary result)   Collection Time: 03/22/22  1:12 PM   Specimen: BLOOD  Result Value Ref Range Status   Specimen Description   Final    BLOOD SITE NOT SPECIFIED Performed at [facility] [address], [place], [place] [address]    Special Requests   Final    BOTTLES DRAWN AEROBIC AND ANAEROBIC Blood Culture results may not be optimal due to an inadequate volume of blood received in culture bottles Performed at [place] [address], [place], [place] [address]    Culture   Final    NO GROWTH 3 DAYS Performed at [facility], [place] [address] [place], [place] [address]    Report Status PENDING  Incomplete  MRSA Next Gen by PCR, Nasal     Status: None   Collection Time: 03/22/22  5:58 PM   Specimen: Nasal Mucosa; Nasal Swab  Result Value Ref Range Status   MRSA by PCR Next Gen NOT DETECTED NOT DETECTED Final    Comment: (NOTE) The GeneXpert MRSA Assay (FDA approved for NASAL specimens only), is one component of a comprehensive MRSA colonization surveillance program. It is not intended to diagnose MRSA infection nor to guide or monitor treatment for MRSA infections. Test performance is not FDA approved in  patients less than 81 years old. Performed at [facility], [place] [address], [place], [place] [address]   Urine Culture     Status: None   Collection Time: 03/22/22  6:45 PM   Specimen: Urine, Clean Catch  Result Value Ref Range Status   Specimen Description   Final    URINE, CLEAN CATCH Performed at [facility], [place] [address] [place],  [address]    Special  Requests   Final    NONE Performed at [facility], [place] [address], [place], [place] [address]    Culture   Final    NO GROWTH Performed at [facility], [place] [address], [place], [place] [address]    Report Status 03/23/2022 FINAL  Final     Time coordinating discharge: 35 minutes  SIGNED:   Aline August, MD  [facility] 03/25/2022, 10:50 AM

## 2022-03-25 NOTE — Progress Notes (Signed)
AVS and discharge instructions reviewed w/ patient. Patient verbalized understanding and had no further questions. 

## 2022-03-25 NOTE — Plan of Care (Signed)

## 2022-03-25 NOTE — TOC Transition Note (Signed)
Transition of Care [facility]) - CM/SW Discharge Note   Patient Details  Name: Cassandra Brennan MRN: [id number] Date of Birth: 1956-09-12  [facility] CM/SW Contact:  Ross Ludwig, LCSW Phone Number: 03/25/2022, 11:10 AM   Clinical Narrative:     Patient will be discharging back home.  Patient will need prior auth for her Eliquis, patient had a benefit check completed on cost of medications on 03/23/2022.  CSW signing off, please reconsult if other TOC needs arise.  Final next level of care: Home/Self Care Barriers to Discharge: Barriers Resolved   Patient Goals and CMS Choice Patient states their goals for this hospitalization and ongoing recovery are:: To return back home. CMS Medicare.gov Compare Post Acute Care list provided to:: Patient Choice offered to / list presented to : Patient  Discharge Placement                       Discharge Plan and Services                                     Social Determinants of Health (SDOH) Interventions     Readmission Risk Interventions     View : No data to display.

## 2022-03-26 ENCOUNTER — Inpatient Hospital Stay: Payer: Commercial Managed Care - HMO | Attending: Hematology

## 2022-03-26 ENCOUNTER — Encounter: Payer: Self-pay | Admitting: Nurse Practitioner

## 2022-03-26 ENCOUNTER — Inpatient Hospital Stay: Payer: Commercial Managed Care - HMO

## 2022-03-26 ENCOUNTER — Other Ambulatory Visit: Payer: Self-pay

## 2022-03-26 ENCOUNTER — Inpatient Hospital Stay (HOSPITAL_BASED_OUTPATIENT_CLINIC_OR_DEPARTMENT_OTHER): Payer: Commercial Managed Care - HMO | Admitting: Nurse Practitioner

## 2022-03-26 VITALS — HR 100 | Resp 18

## 2022-03-26 DIAGNOSIS — F419 Anxiety disorder, unspecified: Secondary | ICD-10-CM | POA: Diagnosis not present

## 2022-03-26 DIAGNOSIS — Z7901 Long term (current) use of anticoagulants: Secondary | ICD-10-CM | POA: Insufficient documentation

## 2022-03-26 DIAGNOSIS — I2699 Other pulmonary embolism without acute cor pulmonale: Secondary | ICD-10-CM | POA: Diagnosis not present

## 2022-03-26 DIAGNOSIS — Z170 Estrogen receptor positive status [ER+]: Secondary | ICD-10-CM

## 2022-03-26 DIAGNOSIS — Z79899 Other long term (current) drug therapy: Secondary | ICD-10-CM | POA: Diagnosis not present

## 2022-03-26 DIAGNOSIS — R5383 Other fatigue: Secondary | ICD-10-CM | POA: Diagnosis not present

## 2022-03-26 DIAGNOSIS — Z95828 Presence of other vascular implants and grafts: Secondary | ICD-10-CM

## 2022-03-26 DIAGNOSIS — C50212 Malignant neoplasm of upper-inner quadrant of left female breast: Secondary | ICD-10-CM | POA: Diagnosis present

## 2022-03-26 DIAGNOSIS — I82411 Acute embolism and thrombosis of right femoral vein: Secondary | ICD-10-CM

## 2022-03-26 DIAGNOSIS — K7689 Other specified diseases of liver: Secondary | ICD-10-CM | POA: Diagnosis not present

## 2022-03-26 DIAGNOSIS — Z86718 Personal history of other venous thrombosis and embolism: Secondary | ICD-10-CM | POA: Diagnosis not present

## 2022-03-26 DIAGNOSIS — Z882 Allergy status to sulfonamides status: Secondary | ICD-10-CM | POA: Insufficient documentation

## 2022-03-26 DIAGNOSIS — R197 Diarrhea, unspecified: Secondary | ICD-10-CM | POA: Diagnosis not present

## 2022-03-26 DIAGNOSIS — Z888 Allergy status to other drugs, medicaments and biological substances status: Secondary | ICD-10-CM | POA: Insufficient documentation

## 2022-03-26 DIAGNOSIS — R590 Localized enlarged lymph nodes: Secondary | ICD-10-CM | POA: Diagnosis not present

## 2022-03-26 LAB — CBC WITH DIFFERENTIAL (CANCER CENTER ONLY)
Abs Immature Granulocytes: 0.1 10*3/uL — ABNORMAL HIGH (ref 0.00–0.07)
Basophils Absolute: 0 10*3/uL (ref 0.0–0.1)
Basophils Relative: 0 %
Eosinophils Absolute: 0.1 10*3/uL (ref 0.0–0.5)
Eosinophils Relative: 2 %
HCT: 24 % — ABNORMAL LOW (ref 36.0–46.0)
Hemoglobin: 8.1 g/dL — ABNORMAL LOW (ref 12.0–15.0)
Immature Granulocytes: 2 %
Lymphocytes Relative: 9 %
Lymphs Abs: 0.4 10*3/uL — ABNORMAL LOW (ref 0.7–4.0)
MCH: 28.2 pg (ref 26.0–34.0)
MCHC: 33.8 g/dL (ref 30.0–36.0)
MCV: 83.6 fL (ref 80.0–100.0)
Monocytes Absolute: 0.2 10*3/uL (ref 0.1–1.0)
Monocytes Relative: 5 %
Neutro Abs: 3.7 10*3/uL (ref 1.7–7.7)
Neutrophils Relative %: 82 %
Platelet Count: 245 10*3/uL (ref 150–400)
RBC: 2.87 MIL/uL — ABNORMAL LOW (ref 3.87–5.11)
RDW: 17.6 % — ABNORMAL HIGH (ref 11.5–15.5)
WBC Count: 4.6 10*3/uL (ref 4.0–10.5)
nRBC: 0.9 % — ABNORMAL HIGH (ref 0.0–0.2)

## 2022-03-26 LAB — BPAM RBC
Blood Product Expiration Date: 202306252359
Unit Type and Rh: 7300

## 2022-03-26 LAB — CMP (CANCER CENTER ONLY)
ALT: 226 U/L — ABNORMAL HIGH (ref 0–44)
AST: 78 U/L — ABNORMAL HIGH (ref 15–41)
Albumin: 3.6 g/dL (ref 3.5–5.0)
Alkaline Phosphatase: 94 U/L (ref 38–126)
Anion gap: 8 (ref 5–15)
BUN: 10 mg/dL (ref 8–23)
CO2: 27 mmol/L (ref 22–32)
Calcium: 9 mg/dL (ref 8.9–10.3)
Chloride: 106 mmol/L (ref 98–111)
Creatinine: 0.78 mg/dL (ref 0.44–1.00)
GFR, Estimated: >60 mL/min (ref >60)
Glucose, Bld: 170 mg/dL — ABNORMAL HIGH (ref 70–99)
Potassium: 3.3 mmol/L — ABNORMAL LOW (ref 3.5–5.1)
Sodium: 141 mmol/L (ref 135–145)
Total Bilirubin: 0.7 mg/dL (ref 0.3–1.2)
Total Protein: 6 g/dL — ABNORMAL LOW (ref 6.5–8.1)

## 2022-03-26 LAB — TYPE AND SCREEN
ABO/RH(D): B POS
ABO/RH(D): B POS
Antibody Screen: NEGATIVE
Antibody Screen: NEGATIVE
Unit division: 0

## 2022-03-26 MED ORDER — HEPARIN SOD (PORK) LOCK FLUSH 100 UNIT/ML IV SOLN: 500.0000 [IU] | Freq: Once | INTRAVENOUS | Status: DC

## 2022-03-26 MED ORDER — SODIUM CHLORIDE 0.9% FLUSH
10.0000 mL | Freq: Once | INTRAVENOUS | Status: AC
  Administered 2022-03-26: 10 mL

## 2022-03-26 MED ORDER — SODIUM CHLORIDE 0.9% FLUSH: 10.0000 mL | Freq: Once | INTRAVENOUS | Status: DC

## 2022-03-26 NOTE — Progress Notes (Signed)
Cassandra Brennan   [phone] [phone]   Clinic Follow up Note   Patient Care Team: Everardo Beals, NP as PCP - General Erroll Luna, MD as Consulting Physician (General Surgery) Truitt Merle, MD as Consulting Physician (Hematology) Gery Pray, MD as Consulting Physician (Radiation Oncology) [name], RN as Oncology Nurse Navigator [name], RN as Oncology Nurse Navigator 03/26/2022  CHIEF COMPLAINT: Hospital follow-up, massive PE, left breast cancer breast cancer on neoadjuvant chemo  SUMMARY OF ONCOLOGIC HISTORY: Oncology History Overview Note   Cancer Staging  Malignant neoplasm of upper-inner quadrant of left breast in female, estrogen receptor positive ([place]) Staging form: Breast, AJCC 8th Edition - Clinical stage from 10/27/2021: Stage IIB (cT1c, cN1, cM0, G3, ER+, PR-, HER2-) - Signed by Truitt Merle, MD on 11/07/2021    Malignant neoplasm of upper-inner quadrant of left breast in female, estrogen receptor positive ([place])  10/19/2021 Mammogram   CLINICAL DATA:  Patient presents for palpable left axillary abnormality.   EXAM: DIGITAL DIAGNOSTIC BILATERAL MAMMOGRAM WITH TOMOSYNTHESIS AND CAD; ULTRASOUND LEFT BREAST LIMITED  IMPRESSION: Suspicious left breast mass 10 o'clock position.   Adjacent suspicious satellite nodule left breast 12 o'clock position.   Palpable mass left axilla corresponds with a large lymph node.   10/27/2021 Initial Biopsy   Diagnosis 1. Breast, left, needle core biopsy, 12 o'clock, ribbon - FIBROADENOMA - NO MALIGNANCY IDENTIFIED 2. Breast, left, needle core biopsy, 10 o'clock, coil - INVASIVE DUCTAL CARCINOMA WITH NECROSIS - DUCTAL CARCINOMA IN SITU - SEE COMMENT 3. Lymph node, needle/core biopsy, left axilla, tribell - INVASIVE DUCTAL CARCINOMA WITH NECROSIS - NO NODAL TISSUE IDENTIFIED - SEE COMMENT Microscopic Comment 2. and 2. Based on the biopsy, the carcinoma appears Nottingham grade  3 of 3 and measures 0.8 cm in greatest linear extent.  3. PROGNOSTIC INDICATORS Results: The tumor cells are NEGATIVE for Her2 (1+). Estrogen Receptor: 30%, POSITIVE, WEAK STAINING INTENSITY Progesterone Receptor: 0%, NEGATIVE Proliferation Marker Ki67: 60%   10/27/2021 Cancer Staging   Staging form: Breast, AJCC 8th Edition - Clinical stage from 10/27/2021: Stage IIB (cT1c, cN1, cM0, G3, ER+, PR-, HER2-) - Signed by [name], NP on 11/28/2021 Stage prefix: Initial diagnosis Histologic grading system: 3 grade system    11/06/2021 Initial Diagnosis   Malignant neoplasm of upper-inner quadrant of left breast in female, estrogen receptor positive ([place])    11/15/2021 Breast MRI   IMPRESSION: 1.9 cm mass in the upper-inner quadrant of the left breast corresponding with the known invasive ductal carcinoma. Enlarged left axillary lymph node corresponding with known metastatic disease.   RECOMMENDATION: Treatment planning of the known left breast cancer and axillary metastasis is recommended.   Additional imaging evaluation of the mass in the liver is recommended with CT with liver mass protocol or MRI.   11/22/2021 Imaging   Bone scan IMPRESSION: No scintigraphic evidence of bony metastatic disease. Degenerative changes as above.   11/22/2021 Echocardiogram   Baseline echo LVEF 55-60%, normal GLS -21.7%   11/23/2021 Imaging   CT CAP IMPRESSION: 1. Bulky lymphadenopathy in the left axilla including a 5.0 x 2.8 cm lymph node. 2. 15 mm nodule identified in the upper inner quadrant of the left breast. 3. No suspicious pulmonary nodule or mass. No evidence for metastatic disease in the abdomen or pelvis. 4. 11 mm cyst in the dome of the left liver. Adjacent tiny hypoattenuating lesions in the right liver are too small to characterize, but likely benign. Attention on follow-up  recommended. 5. Prominent stool volume raises the question of clinical constipation.   11/28/2021 -  Chemotherapy    Patient is on Treatment Plan : BREAST ADJUVANT DOSE DENSE AC q14d / PACLitaxel q7d       Genetic Testing   [facility] CancerNext was Negative. Report date is 11/26/2021.   The CancerNext gene panel offered by [employer] includes sequencing, rearrangement analysis, and RNA analysis for the following 36 genes:   APC, ATM, AXIN2, BARD1, BMPR1A, BRCA1, BRCA2, BRIP1, CDH1, CDK4, CDKN2A, CHEK2, DICER1, HOXB13, EPCAM, GREM1, MLH1, MSH2, MSH3, MSH6, MUTYH, NBN, NF1, NTHL1, PALB2, PMS2, POLD1, POLE, PTEN, RAD51C, RAD51D, RECQL, SMAD4, SMARCA4, STK11, and TP53.      CURRENT THERAPY: Neoadjuvant ddAC q2 weeks x4 09/27/21 - 01/08/22 followed by weekly Taxol x12 starting 01/22/22 (completed 9 of 12 cycles as of current)  INTERVAL HISTORY: Cassandra Brennan returns for follow-up.  Last seen by me 03/20/2022 prior to cycle 9 of 12 weekly Taxol.  She was tolerating chemo well except progressive fatigue and exertional dyspnea.  The day after she developed extreme fatigue and dyspnea and "everything was shutting down."  She was brought in by EMS, chest x-ray was negative.  CTA on 03/22/2022 showed large bilateral pulmonary emboli with evidence of right heart strain, no pericardial effusion.  Mild right posterior basilar subsegmental atelectasis was noted.  It did show the left axillary lymph node had decreased compared to prior.  She was given TNK.  Doppler showed age indeterminant DVT involving the right common femoral vein.  Echo 03/23/2022 showed LVEF 50-55% (previously 55-60% on 11/22/2021 prior to chemo).  She improved and was transitioned to Eliquis and discharged home 03/25/2022.   She presents today in a wheelchair.  She continues antibiotics for possible pneumonia, stools are little softer.  She is still tired but breathing has improved, feels much better.  Tolerating Eliquis without bleeding.  In hindsight she has felt a pain in the right groin for some time but did not report it because she did not know what it was.  No obvious  right greater than left leg edema.  All other systems were reviewed with the patient and are negative.  MEDICAL HISTORY:  Past Medical History:  Diagnosis Date   Acute dyspnea    Acute metabolic encephalopathy    Anxiety    Arthritis    right knee    Elevated troponin    GERD (gastroesophageal reflux disease)    Hyperlipidemia    Hypertension    Lactic acidosis    Obstructive cardiovascular shock ([place]) 03/22/2022   Positive colorectal cancer screening using Cologuard test     SURGICAL HISTORY: Past Surgical History:  Procedure Laterality Date   BREAST CYST EXCISION Right    pt stated in [place]     other GI testing     unsure but had to drink a chalky drink    PORTACATH PLACEMENT N/A 11/27/2021   Procedure: INSERTION PORT-A-CATH;  Surgeon: Erroll Luna, MD;  Location: [facility];  Service: General;  Laterality: N/A;   TONSILLECTOMY     TUBAL LIGATION      I have reviewed the social history and family history with the patient and they are unchanged from previous note.  ALLERGIES:  is allergic to prednisone, rosuvastatin, other, robitussin (alcohol free) [guaifenesin], and sulfa antibiotics.  MEDICATIONS:  Current Outpatient Medications  Medication Sig Dispense Refill   acetaminophen (TYLENOL) 500 MG tablet Take 500 mg by mouth every 8 (eight) hours  as needed for moderate pain.     apixaban (ELIQUIS) 5 MG TABS tablet Take 2 tablets (8m) twice daily for 11 doses, then 1 tablet (552m twice daily 71 tablet 0   cefdinir (OMNICEF) 300 MG capsule Take 1 capsule (300 mg total) by mouth every 12 (twelve) hours for 2 days. 4 capsule 0   lidocaine-prilocaine (EMLA) cream Apply to affected area once 30 g 3   metoprolol succinate (TOPROL-XL) 100 MG 24 hr tablet Take 100 mg by mouth daily. Take with or immediately following a meal.     Multiple Vitamins-Minerals (MULTIVITAMIN ADULTS) TABS Take 1 tablet by mouth daily.     ondansetron (ZOFRAN) 8 MG tablet Take 1 tablet (8  mg total) by mouth 2 (two) times daily as needed. Start on the third day after chemotherapy. (Patient taking differently: Take 8 mg by mouth 2 (two) times daily as needed for nausea or vomiting. Start on the third day after chemotherapy.) 30 tablet 1   potassium chloride (KLOR-CON M) 10 MEQ tablet Take 1 tablet (10 mEq total) by mouth daily. 30 tablet 0   pravastatin (PRAVACHOL) 20 MG tablet Take 1 tablet (20 mg total) by mouth daily. 30 tablet 0   prochlorperazine (COMPAZINE) 10 MG tablet Take 1 tablet (10 mg total) by mouth every 6 (six) hours as needed (Nausea or vomiting). 30 tablet 1   No current facility-administered medications for this visit.    PHYSICAL EXAMINATION: ECOG PERFORMANCE STATUS: 1 - Symptomatic but completely ambulatory  Vitals:   03/26/22 0857  Pulse: 100  Resp: 18  SpO2: 97%   O2 sat 97-98% on room air during ambulation There were no vitals filed for this visit.  GENERAL:alert, no distress and comfortable LUNGS: clear with normal breathing effort HEART: regular rate & rhythm, trace bilateral lower leg edema  NEURO: alert & oriented x 3 with fluent speech, no focal motor/sensory deficits PAC without erythema  LABORATORY DATA:  I have reviewed the data as listed    Latest Ref Rng & Units 03/26/2022    7:48 AM 03/25/2022    3:08 AM 03/24/2022    4:21 AM  CBC  WBC 4.0 - 10.5 K/uL 4.6   3.5   4.2    Hemoglobin 12.0 - 15.0 g/dL 8.1   7.2   7.2    Hematocrit 36.0 - 46.0 % 24.0   22.7   22.4    Platelets 150 - 400 K/uL 245   212   178          Latest Ref Rng & Units 03/26/2022    7:48 AM 03/25/2022    3:08 AM 03/24/2022    4:21 AM  CMP  Glucose 70 - 99 mg/dL 170   103   108    BUN 8 - 23 mg/dL '10   11   14    ' Creatinine 0.44 - 1.00 mg/dL 0.78   0.78   0.83    Sodium 135 - 145 mmol/L 141   141   141    Potassium 3.5 - 5.1 mmol/L 3.3   3.6   3.6    Chloride 98 - 111 mmol/L 106   110   110    CO2 22 - 32 mmol/L '27   26   25    ' Calcium 8.9 - 10.3 mg/dL 9.0   8.4    8.0    Total Protein 6.5 - 8.1 g/dL 6.0   5.4   5.3    Total  Bilirubin 0.3 - 1.2 mg/dL 0.7   0.8   0.6    Alkaline Phos 38 - 126 U/L 94   74   74    AST 15 - 41 U/L 78   70   177    ALT 0 - 44 U/L 226   258   380        RADIOGRAPHIC STUDIES: I have personally reviewed the radiological images as listed and agreed with the findings in the report. VAS [place] LOWER EXTREMITY VENOUS (DVT)  Result Date: 03/24/2022  Lower Venous DVT Study Patient Name:  Cassandra Brennan  Date of Exam:   03/24/2022 Medical Rec #: [id number]       Accession #:    [id number] Date of Birth: [birth year]       Patient Gender: F Patient Age:   54 years Exam Location:  [facility] Procedure:      VAS [place] LOWER EXTREMITY VENOUS (DVT) Referring Phys: Aline August --------------------------------------------------------------------------------  Indications: Pulmonary embolism.  Limitations: Poor ultrasound/tissue interface. Comparison Study: No prior study Performing Technologist: Maudry Mayhew MHA, RDMS, RVT, RDCS  Examination Guidelines: A complete evaluation includes B-mode imaging, spectral Doppler, color Doppler, and power Doppler as needed of all accessible portions of each vessel. Bilateral testing is considered an integral part of a complete examination. Limited examinations for reoccurring indications may be performed as noted. The reflux portion of the exam is performed with the patient in reverse Trendelenburg.  +---------+---------------+---------+-----------+----------+-----------------+ RIGHT    CompressibilityPhasicitySpontaneityPropertiesThrombus Aging    +---------+---------------+---------+-----------+----------+-----------------+ CFV      Partial        Yes      Yes                  Age Indeterminate +---------+---------------+---------+-----------+----------+-----------------+ SFJ      Full                                                            +---------+---------------+---------+-----------+----------+-----------------+ FV Prox  Full                                                           +---------+---------------+---------+-----------+----------+-----------------+ FV Mid   Full                                                           +---------+---------------+---------+-----------+----------+-----------------+ FV DistalFull                                                           +---------+---------------+---------+-----------+----------+-----------------+ PFV      Full                                                           +---------+---------------+---------+-----------+----------+-----------------+  POP      Full           Yes      Yes                                    +---------+---------------+---------+-----------+----------+-----------------+ PTV      Full                                                           +---------+---------------+---------+-----------+----------+-----------------+ PERO     Full                                                           +---------+---------------+---------+-----------+----------+-----------------+ EIV                              Yes        patent                      +---------+---------------+---------+-----------+----------+-----------------+   +---------+---------------+---------+-----------+----------+--------------+ LEFT     CompressibilityPhasicitySpontaneityPropertiesThrombus Aging +---------+---------------+---------+-----------+----------+--------------+ CFV      Full           Yes      Yes                                 +---------+---------------+---------+-----------+----------+--------------+ SFJ      Full                                                        +---------+---------------+---------+-----------+----------+--------------+ FV Prox  Full                                                         +---------+---------------+---------+-----------+----------+--------------+ FV Mid   Full                                                        +---------+---------------+---------+-----------+----------+--------------+ FV DistalFull                                                        +---------+---------------+---------+-----------+----------+--------------+ PFV      Full                                                        +---------+---------------+---------+-----------+----------+--------------+  POP      Full           Yes      Yes                                 +---------+---------------+---------+-----------+----------+--------------+ PTV      Full                                                        +---------+---------------+---------+-----------+----------+--------------+ PERO     Full                                                        +---------+---------------+---------+-----------+----------+--------------+     Summary: RIGHT: - Findings consistent with age indeterminate deep vein thrombosis involving the right common femoral vein. - No cystic structure found in the popliteal fossa.  LEFT: - There is no evidence of deep vein thrombosis in the lower extremity.  - No cystic structure found in the popliteal fossa.  *See table(s) above for measurements and observations. Electronically signed by Deitra Mayo MD on 03/24/2022 at 1:12:20 PM.    Final      ASSESSMENT & PLAN: Cassandra Brennan is a 66 y.o. postmenopausal female    Acute bilateral pulmonary emboli, at least submassive, with right heart strain, right femoral DVT -She had been tolerating chemo well except progressive fatigue, on 03/20/2022 she reported exertional dyspnea (prior to cycle 9 weekly taxol).  This progressed over the next day and EMS was called -I reviewed her ED and hospital course from 6/1-6/423. CTA on 03/22/2022 showed large bilateral pulmonary emboli with evidence of  right heart strain, no pericardial effusion, and mild right posterior basilar subsegmental atelectasis was noted.  -S/p IV TNK; she was also started on antibiotics for possible pneumonia -03/23/2022 Doppler showed age indeterminant DVT involving the right common femoral vein.  - Echo showed LVEF 50-55% (previously 55-60% on 11/22/2021 prior to chemo).  She has a history of anthracycline chemo -Respiratory improved and transition to Eliquis, discharge 03/25/22 -Cassandra Brennan appears improved, stable for outpatient management.  Tolerating Eliquis without bleeding.  O2 sat 97-98% on room air on ambulation -Duration of anticoagulation is unknown at this time, possibly lifelong.  We reviewed the risk factors for DVT/PE include malignancy, chemotherapy, immobility.  Continue Eliquis -She may be a candidate to hold [facility] for hypercoagulable work-up in the future -Further chemo is currently on hold -She knows to remain mobile at home but nothing strenuous given the right heart strain -Follow-up next week, or sooner if needed  2. 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 B/l diagnostic MM and left [place] 10/19/21 showed: 2 cm mass at 10 o'clock; 5 mm indeterminate satellite mass at 12 o'clock; palpable left axilla mass corresponds with large lymph node. Biopsy 10/27/21 confirmed invasive ductal carcinoma with necrosis to both 10 o'clock and lymph node, grade 3. ER weakly positive (30%). 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  Neoadjuvant chemotherapy has been recommended.  She was seen by Dr. Brantley Stage and will likely proceed with lumpectomy, SLNB and targeted lymph node dissection following chemo.  She would benefit from adjuvant radiation if she has lumpectomy. -breast MRI 11/15/2021  was previously reviewed, which shows the biopsy proven malignancy in the upper inner left breat measuring 1.5 x1.3 x1.9 cm, a biopsy proven malignant 4.3 cm left axillary LN and 2 other abnormal LNs measuring 1.3 and 1.5 cm, and a 1.3 liver mass likely representing a cyst.  -CT CAP 11/23/2021 was previously reviewed, which shows known left breast cancer and bulky axillary adenopathy, a liver cyst, and tiny indeterminate liver nodules too small to characterize. We will monitor these in the future. No evidence of distant metastasis.  -Bone scan is negative for osseous metastasis.  -Baseline echo is normal, LVEF 55-60%. -Left breast mass in the upper inner quadrant measured 2 cm and 2 palpable lymph nodes largest measuring 4.5 cm on 2/7 the day she began neoadjuvant chemo, for reference -She began neoadjuvant AC with cycle 09/27/22, tolerated well with mild fatigue and nausea.   -S/p cycle 2 AC the upper inner left breast mass is no longer appreciable and axillary adenopathy has improved. -S/p cycle 4 AC the left breast mass and axillary adenopathy are not palpable. She has had an excellent clinical response to neoadjuvant chemo        -began weekly taxol 01/22/22, s/p 9 cycles.  Tolerating well except progressive fatigue and reported exertional dyspnea 03/20/2022 prior to cycle 9 -She was subsequently admitted for acute bilateral at least submassive PE 03/22/22 - 03/25/22.  CTA showed decreased size of left axillary lymph node -We discussed further chemo is currently on hold.  I spoke to her breast surgeon Dr. Brantley Stage who notes this will delay her breast surgery by 3 months.  Radiation oncology (Dr. Sondra Come) is also aware -She may be a candidate for AI while she recovers and waits for surgery, will defer to Dr. Burr Medico -Follow-up 6/12 with Dr. Burr Medico to discuss her care plan   3.  Hypertension and anxiety -Continue medications. -Follow-up with primary care physician -I will refill metoprolol and  xanax   Plan: -Reviewed recent ED/hospital course and today's labs -Hold chemo -Continue Eliquis -Complete antibiotics; reviewed symptom management for loose stools -K 3.3, increase oral K to 1 tab twice daily -Follow-up 6/12, or sooner if needed   All questions were answered. The patient knows to call the clinic with any problems, questions or concerns. No barriers to learning was detected. I spent 20 minutes counseling the patient face to face. The total time spent in the appointment was 30 minutes and more than 50% was on counseling and review of test results and coordination of care.     [name], NP 03/26/22

## 2022-03-27 LAB — CULTURE, BLOOD (ROUTINE X 2)
Culture: NO GROWTH
Culture: NO GROWTH
Special Requests: ADEQUATE

## 2022-03-28 ENCOUNTER — Telehealth: Payer: Self-pay | Admitting: Nurse Practitioner

## 2022-03-28 NOTE — Telephone Encounter (Signed)
06/05 LOS not scheduled per provider's instruction. MD will not be in town yet.

## 2022-04-02 ENCOUNTER — Other Ambulatory Visit: Payer: Self-pay | Admitting: Nurse Practitioner

## 2022-04-02 ENCOUNTER — Inpatient Hospital Stay: Payer: Commercial Managed Care - HMO | Admitting: Nurse Practitioner

## 2022-04-02 ENCOUNTER — Telehealth: Payer: Self-pay

## 2022-04-02 ENCOUNTER — Inpatient Hospital Stay: Payer: Commercial Managed Care - HMO

## 2022-04-02 ENCOUNTER — Telehealth: Payer: Self-pay | Admitting: Nurse Practitioner

## 2022-04-02 MED ORDER — ALPRAZOLAM 0.25 MG PO TABS: 0.2500 mg | ORAL_TABLET | Freq: Every day | ORAL | 0 refills | Status: DC | PRN

## 2022-04-02 NOTE — Telephone Encounter (Signed)
This nurse called patient and left a message stating the provider has called in her prescription for xanax.  No further concerns at this time.  Patient does know to call clinic if there are any questions or concerns.

## 2022-04-02 NOTE — Telephone Encounter (Signed)
I called Cassandra Brennan at home. She is doing well, still mild DOE but up and mobile at home. Yesterday she had an episode of tachycardia that caused her to feel anxious. She rested and took deep breaths and felt better. She notes xanax was stopped at discharge but not sure why. She has been taking K BID until today. She is eating and drinking. Tolerating eliquis, denies bleeding. Otherwise has no complaints.   Chemo remains on hold. Will f/up with her and update schedule once Cassandra Brennan returns to clinic tomorrow. Continue eliquis. I will look into xanax. She appreciates the call.   [name], NP

## 2022-04-03 ENCOUNTER — Encounter: Payer: Self-pay | Admitting: *Deleted

## 2022-04-03 ENCOUNTER — Inpatient Hospital Stay (HOSPITAL_BASED_OUTPATIENT_CLINIC_OR_DEPARTMENT_OTHER): Payer: Commercial Managed Care - HMO | Admitting: Hematology

## 2022-04-03 DIAGNOSIS — C50212 Malignant neoplasm of upper-inner quadrant of left female breast: Secondary | ICD-10-CM

## 2022-04-03 DIAGNOSIS — Z170 Estrogen receptor positive status [ER+]: Secondary | ICD-10-CM | POA: Diagnosis not present

## 2022-04-04 ENCOUNTER — Telehealth: Payer: Self-pay | Admitting: Hematology

## 2022-04-04 ENCOUNTER — Encounter: Payer: Self-pay | Admitting: Hematology

## 2022-04-04 NOTE — Progress Notes (Signed)
[place]   [phone] [phone]   Clinic Follow up Note   Patient Care Team: Everardo Beals, NP as PCP - General Erroll Luna, MD as Consulting Physician (General Surgery) Truitt Merle, MD as Consulting Physician (Hematology) Gery Pray, MD as Consulting Physician (Radiation Oncology) [name], RN as Oncology Nurse Navigator [name], RN as Oncology Nurse Navigator  Date of Service:  04/04/2022  CHIEF COMPLAINT: f/u recent hospital discharge   CURRENT THERAPY:  Neoadjuvant Taxol, weekly, starting 01/22/22, held since 03/20/2022  ASSESSMENT & PLAN:  Cassandra Brennan is a 66 y.o. post-menopausal female with   1. 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 B/l diagnostic MM and left [place] 10/19/21 showed: 2 cm mass at 10 o'clock; 5 mm indeterminate satellite mass at 12 o'clock; palpable left axilla mass corresponds with large lymph node. Biopsy 10/27/21 confirmed invasive ductal carcinoma with necrosis to both 10 o'clock and lymph node, grade 3. ER weakly positive (30%). -breast MRI 11/15/21 shows the biopsy proven malignancy in the upper inner left breat measuring 1.9 cm, a biopsy proven malignant 4.3 cm left axillary LN and 2 other abnormal LNs measuring 1.3 and 1.5 cm, and a 1.3 liver mass likely representing a cyst.  -CT CAP 11/23/21 shows known left breast cancer and bulky axillary adenopathy, a liver cyst, and tiny indeterminate liver nodules too small to characterize. We will monitor these in the future. No evidence of distant metastasis.  -Bone scan is negative for osseous metastasis.  -Baseline echo on 11/22/21 is normal, LVEF 55-60%. -Left breast mass in the upper inner quadrant measured 2 cm and 2 palpable lymph nodes largest measuring 4.5 cm on 2/7 the day she began neoadjuvant chemo. -She received 4 cycles  of neoadjuvant AC 09/27/21 - 01/08/22, tolerated moderate well with fatigue and nausea. Breast mass and adenopathy are no longer palpable. -she moved to weekly taxol on 01/22/22.  -She unfortunately was hospitalized on March 22, 2022 for massive PE and cardiogenic shock.  She was discharged on March 25, 2022.  I reviewed chart in detail.  I discussed with her that her PE is probably related to her underlying malignancy and chemotherapy.  Given the severity of her PE, and she has only 3 doses of Taxol left, I do not plan to restart chemotherapy. -She is recovering well from recent hospital stay. -Plan to repeat a breast MRI in the next few weeks to evaluate her response to neoadjuvant chemotherapy. -Her breast surgery will be postponed for about 3 months due to the massive PE -I plan to start her on AI after next visit while she is waiting for surgery    2. [place] B/L PE on 6/3 -CT scan on March 22, 2022 showed large bilateral pulmonary emboli with right heart strain. -Status post thrombolysis -Tolerating Eliquis well, will continue for at least 6 months, possible indefinitely   3. Hypertension and anxiety -Continue medications and f/u with PCP     PLAN: -We will cancel her rest of weekly Taxol -Lab and follow-up in 2 weeks, plan to start her on aromatase inhibitor after next visit -Bilateral breast MRI with and without contrast before next visit   No problem-specific Assessment & Plan notes found for this encounter.   SUMMARY OF ONCOLOGIC HISTORY: Oncology History Overview Note   Cancer Staging  Malignant neoplasm of upper-inner quadrant of left breast  in female, estrogen receptor positive ([place]) Staging form: Breast, AJCC 8th Edition - Clinical stage from 10/27/2021: Stage IIB (cT1c, cN1, cM0, G3, ER+, PR-, HER2-) - Signed by Truitt Merle, MD on 11/07/2021    Malignant neoplasm of upper-inner quadrant of left breast in female, estrogen receptor positive ([place])  10/19/2021 Mammogram   CLINICAL  DATA:  Patient presents for palpable left axillary abnormality.   EXAM: DIGITAL DIAGNOSTIC BILATERAL MAMMOGRAM WITH TOMOSYNTHESIS AND CAD; ULTRASOUND LEFT BREAST LIMITED  IMPRESSION: Suspicious left breast mass 10 o'clock position.   Adjacent suspicious satellite nodule left breast 12 o'clock position.   Palpable mass left axilla corresponds with a large lymph node.   10/27/2021 Initial Biopsy   Diagnosis 1. Breast, left, needle core biopsy, 12 o'clock, ribbon - FIBROADENOMA - NO MALIGNANCY IDENTIFIED 2. Breast, left, needle core biopsy, 10 o'clock, coil - INVASIVE DUCTAL CARCINOMA WITH NECROSIS - DUCTAL CARCINOMA IN SITU - SEE COMMENT 3. Lymph node, needle/core biopsy, left axilla, tribell - INVASIVE DUCTAL CARCINOMA WITH NECROSIS - NO NODAL TISSUE IDENTIFIED - SEE COMMENT Microscopic Comment 2. and 2. Based on the biopsy, the carcinoma appears Nottingham grade 3 of 3 and measures 0.8 cm in greatest linear extent.  3. PROGNOSTIC INDICATORS Results: The tumor cells are NEGATIVE for Her2 (1+). Estrogen Receptor: 30%, POSITIVE, WEAK STAINING INTENSITY Progesterone Receptor: 0%, NEGATIVE Proliferation Marker Ki67: 60%   10/27/2021 Cancer Staging   Staging form: Breast, AJCC 8th Edition - Clinical stage from 10/27/2021: Stage IIB (cT1c, cN1, cM0, G3, ER+, PR-, HER2-) - Signed by [name], NP on 11/28/2021 Stage prefix: Initial diagnosis Histologic grading system: 3 grade system   11/06/2021 Initial Diagnosis   Malignant neoplasm of upper-inner quadrant of left breast in female, estrogen receptor positive ([place])   11/15/2021 Breast MRI   IMPRESSION: 1.9 cm mass in the upper-inner quadrant of the left breast corresponding with the known invasive ductal carcinoma. Enlarged left axillary lymph node corresponding with known metastatic disease.   RECOMMENDATION: Treatment planning of the known left breast cancer and axillary metastasis is recommended.   Additional imaging  evaluation of the mass in the liver is recommended with CT with liver mass protocol or MRI.   11/22/2021 Imaging   Bone scan IMPRESSION: No scintigraphic evidence of bony metastatic disease. Degenerative changes as above.   11/22/2021 Echocardiogram   Baseline echo LVEF 55-60%, normal GLS -21.7%   11/23/2021 Imaging   CT CAP IMPRESSION: 1. Bulky lymphadenopathy in the left axilla including a 5.0 x 2.8 cm lymph node. 2. 15 mm nodule identified in the upper inner quadrant of the left breast. 3. No suspicious pulmonary nodule or mass. No evidence for metastatic disease in the abdomen or pelvis. 4. 11 mm cyst in the dome of the left liver. Adjacent tiny hypoattenuating lesions in the right liver are too small to characterize, but likely benign. Attention on follow-up recommended. 5. Prominent stool volume raises the question of clinical constipation.   11/28/2021 -  Chemotherapy   Patient is on Treatment Plan : BREAST ADJUVANT DOSE DENSE AC q14d / PACLitaxel q7d      Genetic Testing   [facility] CancerNext was Negative. Report date is 11/26/2021.   The CancerNext gene panel offered by [employer] includes sequencing, rearrangement analysis, and RNA analysis for the following 36 genes:   APC, ATM, AXIN2, BARD1, BMPR1A, BRCA1, BRCA2, BRIP1, CDH1, CDK4, CDKN2A, CHEK2, DICER1, HOXB13, EPCAM, GREM1, MLH1, MSH2, MSH3, MSH6, MUTYH, NBN, NF1, NTHL1, PALB2, PMS2, POLD1, POLE, PTEN, RAD51C, RAD51D,  RECQL, SMAD4, SMARCA4, STK11, and TP53.       INTERVAL HISTORY:  Cassandra Brennan is scheduled for a phone visit to follow-up her recent hospital admission for bilateral PE and cardiogenic shock.  I have verified her identity on the phone.  She is recovering from her recent hospital stay, able to tolerate more activities at home, and feeling better overall.  She is able to do self-care and some light housework now.  No pain, dyspnea, or other new symptoms.  She is tolerating Eliquis well, no signs of bleeding.  MEDICAL  HISTORY:  Past Medical History:  Diagnosis Date   Acute dyspnea    Acute metabolic encephalopathy    Anxiety    Arthritis    right knee    Elevated troponin    GERD (gastroesophageal reflux disease)    Hyperlipidemia    Hypertension    Lactic acidosis    Obstructive cardiovascular shock ([place]) 03/22/2022   Positive colorectal cancer screening using Cologuard test     SURGICAL HISTORY: Past Surgical History:  Procedure Laterality Date   BREAST CYST EXCISION Right    pt stated in [place]     other GI testing     unsure but had to drink a chalky drink    PORTACATH PLACEMENT N/A 11/27/2021   Procedure: INSERTION PORT-A-CATH;  Surgeon: Erroll Luna, MD;  Location: [facility];  Service: General;  Laterality: N/A;   TONSILLECTOMY     TUBAL LIGATION      I have reviewed the social history and family history with the patient and they are unchanged from previous note.  ALLERGIES:  is allergic to prednisone, rosuvastatin, other, robitussin (alcohol free) [guaifenesin], and sulfa antibiotics.  MEDICATIONS:  Current Outpatient Medications  Medication Sig Dispense Refill   acetaminophen (TYLENOL) 500 MG tablet Take 500 mg by mouth every 8 (eight) hours as needed for moderate pain.     ALPRAZolam (XANAX) 0.25 MG tablet Take 1 tablet (0.25 mg total) by mouth daily as needed for anxiety. 30 tablet 0   apixaban (ELIQUIS) 5 MG TABS tablet Take 2 tablets (32m) twice daily for 11 doses, then 1 tablet (574m twice daily 71 tablet 0   lidocaine-prilocaine (EMLA) cream Apply to affected area once 30 g 3   metoprolol succinate (TOPROL-XL) 100 MG 24 hr tablet Take 100 mg by mouth daily. Take with or immediately following a meal.     Multiple Vitamins-Minerals (MULTIVITAMIN ADULTS) TABS Take 1 tablet by mouth daily.     ondansetron (ZOFRAN) 8 MG tablet Take 1 tablet (8 mg total) by mouth 2 (two) times daily as needed. Start on the third day after chemotherapy. (Patient taking differently:  Take 8 mg by mouth 2 (two) times daily as needed for nausea or vomiting. Start on the third day after chemotherapy.) 30 tablet 1   potassium chloride (KLOR-CON M) 10 MEQ tablet Take 1 tablet (10 mEq total) by mouth daily. 30 tablet 0   pravastatin (PRAVACHOL) 20 MG tablet Take 1 tablet (20 mg total) by mouth daily. 30 tablet 0   prochlorperazine (COMPAZINE) 10 MG tablet Take 1 tablet (10 mg total) by mouth every 6 (six) hours as needed (Nausea or vomiting). 30 tablet 1   No current facility-administered medications for this visit.    PHYSICAL EXAMINATION: ECOG PERFORMANCE STATUS: 1 - Symptomatic but completely ambulatory  There were no vitals filed for this visit.  Wt Readings from Last 3 Encounters:  03/25/22 182 lb 3.2  oz (82.6 kg)  03/20/22 179 lb 9.6 oz (81.5 kg)  03/12/22 179 lb 12 oz (81.5 kg)     LABORATORY DATA:  I have reviewed the data as listed    Latest Ref Rng & Units 03/26/2022    7:48 AM 03/25/2022    3:08 AM 03/24/2022    4:21 AM  CBC  WBC 4.0 - 10.5 K/uL 4.6  3.5  4.2   Hemoglobin 12.0 - 15.0 g/dL 8.1  7.2  7.2   Hematocrit 36.0 - 46.0 % 24.0  22.7  22.4   Platelets 150 - 400 K/uL 245  212  178         Latest Ref Rng & Units 03/26/2022    7:48 AM 03/25/2022    3:08 AM 03/24/2022    4:21 AM  CMP  Glucose 70 - 99 mg/dL 170  103  108   BUN 8 - 23 mg/dL _0 Creatinine 0.44 - 1.00 mg/dL 0.78  0.78  0.83   Sodium 135 - 145 mmol/L 141  141  141   Potassium 3.5 - 5.1 mmol/L 3.3  3.6  3.6   Chloride 98 - 111 mmol/L 106  110  110   CO2 22 - 32 mmol/L _1 Calcium 8.9 - 10.3 mg/dL 9.0  8.4  8.0   Total Protein 6.5 - 8.1 g/dL 6.0  5.4  5.3   Total Bilirubin 0.3 - 1.2 mg/dL 0.7  0.8  0.6   Alkaline Phos 38 - 126 U/L 94  74  74   AST 15 - 41 U/L 78  70  177   ALT 0 - 44 U/L 226  258  380       RADIOGRAPHIC STUDIES: I have personally reviewed the radiological images as listed and agreed with the findings in the report. No results found.    No  orders of the defined types were placed in this encounter.  All questions were answered. The patient knows to call the clinic with any problems, questions or concerns. No barriers to learning was detected. The total time spent in the appointment was 22 minutes.     Truitt Merle, MD 04/03/2022

## 2022-04-04 NOTE — Telephone Encounter (Signed)
Scheduled follow-up appointment per 6/13 los. Patient is aware. 

## 2022-04-09 ENCOUNTER — Inpatient Hospital Stay: Payer: Commercial Managed Care - HMO

## 2022-04-10 ENCOUNTER — Encounter: Payer: Self-pay | Admitting: *Deleted

## 2022-04-12 ENCOUNTER — Ambulatory Visit (HOSPITAL_COMMUNITY)
Admission: RE | Admit: 2022-04-12 | Discharge: 2022-04-12 | Disposition: A | Discharge status: Home / Self Care | Payer: Commercial Managed Care - HMO | Source: Ambulatory Visit | Attending: Nurse Practitioner | Admitting: Nurse Practitioner

## 2022-04-12 DIAGNOSIS — Z170 Estrogen receptor positive status [ER+]: Secondary | ICD-10-CM | POA: Diagnosis present

## 2022-04-12 DIAGNOSIS — C50212 Malignant neoplasm of upper-inner quadrant of left female breast: Secondary | ICD-10-CM | POA: Diagnosis present

## 2022-04-12 IMAGING — MR MR BREAST BILAT WO/W CM
9 of 12 series · 29 of 48 positions shown · IV contrast (8ML GADAVIST)
Comparison: Previous exams, including pretreatment Breast MRI
[DATE].

CLINICAL DATA: 65-year-old with biopsy-proven grade 3 invasive
ductal carcinoma and DCIS involving the UPPER INNER QUADRANT of the
LEFT breast (coil clip) and biopsy-proven metastatic disease to a
LEFT axillary lymph node ([place] clip). She also has a
biopsy-proven benign fibroadenoma in the upper breast at 12 o'clock
(ribbon clip).

The patient recently completed neoadjuvant chemotherapy and MRI is
obtained to evaluate treatment response.
EXAM:
BILATERAL BREAST MRI WITH AND WITHOUT CONTRAST
TECHNIQUE: Multiplanar, multisequence MR images of both breasts were obtained
prior to and following the intravenous administration of 8 ml of
Gadavist.

[Series 2: T2 · axial · 3.0mm · 0.91mm/px · 1 of 59 slices shown]
[im 1/59]
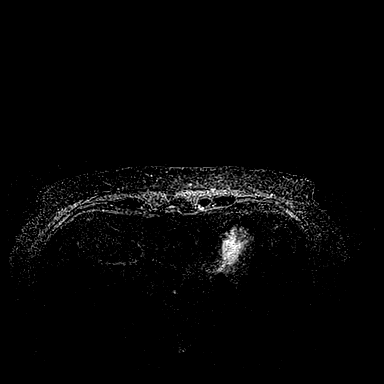

[Series 3: T1 fat-sat · axial · 1.2mm · 0.78mm/px · z∈[-108,+64]mm · 5 of 144 slices shown (1 of 5)]
[im 1/144]
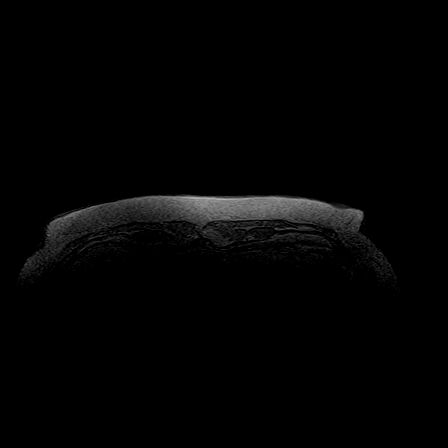
[im 36/144]
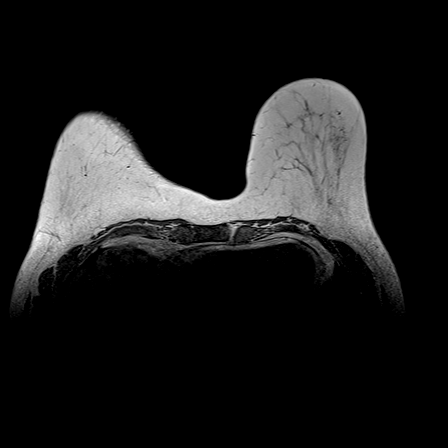
[im 72/144]
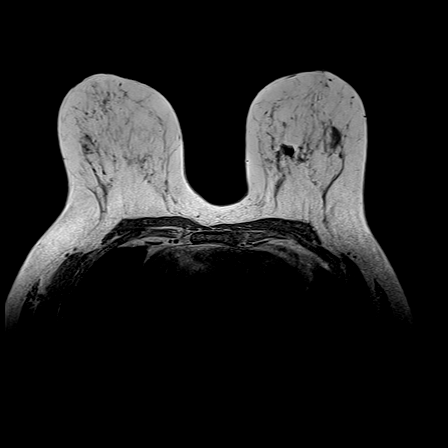
[im 108/144]
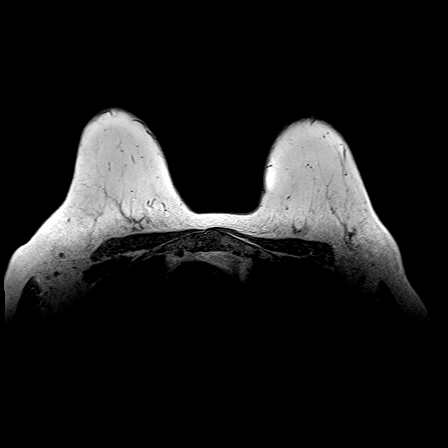
[im 144/144]
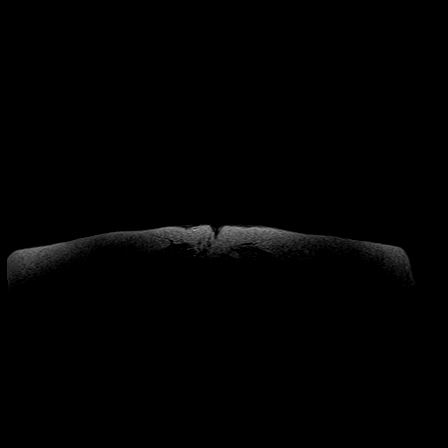

[Series 4: T1 fat-sat · axial · 1.2mm · 0.84mm/px · z∈[-108,+64]mm · 5 of 144 slices shown (2 of 5)]
[im 1/144]
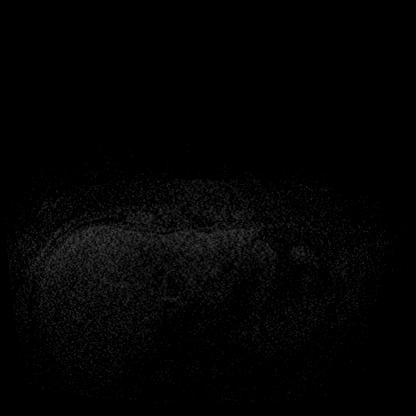
[im 36/144]
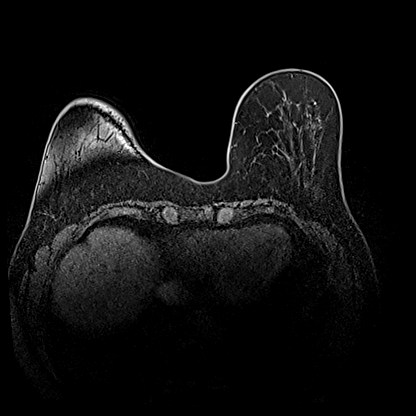
[im 72/144]
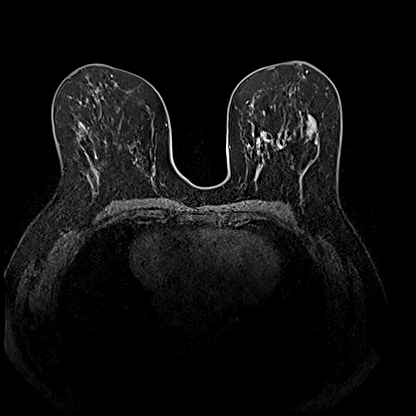
[im 108/144]
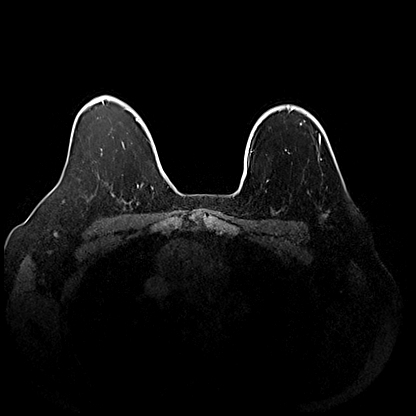
[im 144/144]
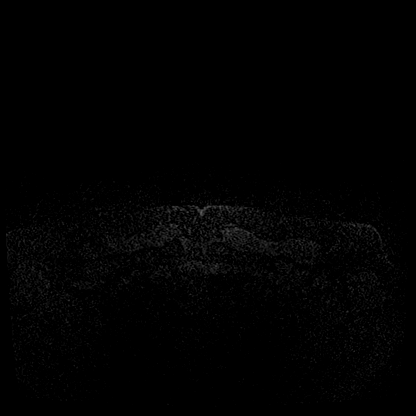

[Series 5: T1 fat-sat · axial · 1.2mm · 0.84mm/px · z∈[-108,+64]mm · 5 of 144 slices shown (3 of 5)]
[im 1/144]
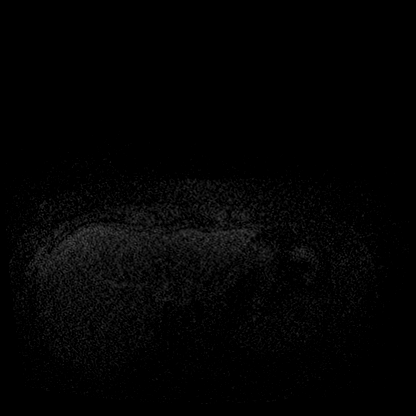
[im 36/144]
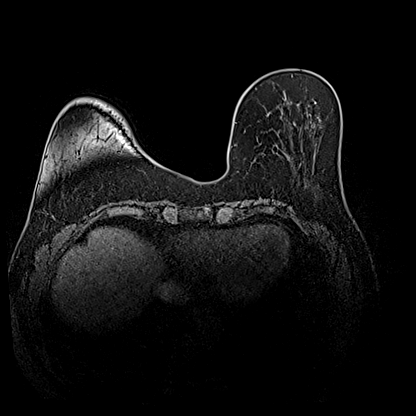
[im 72/144]
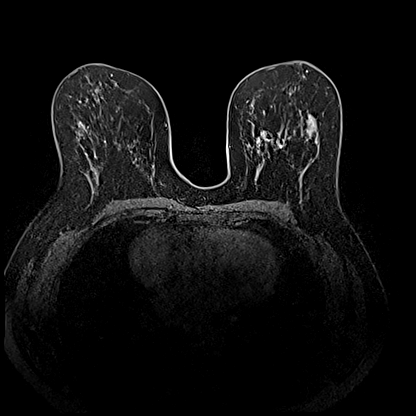
[im 108/144]
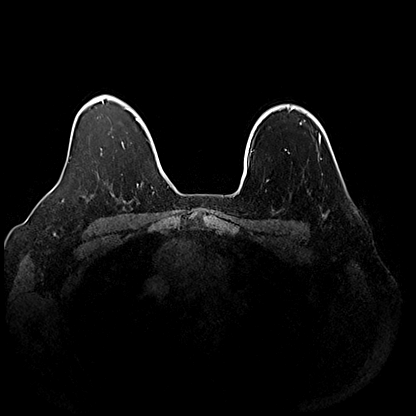
[im 144/144]
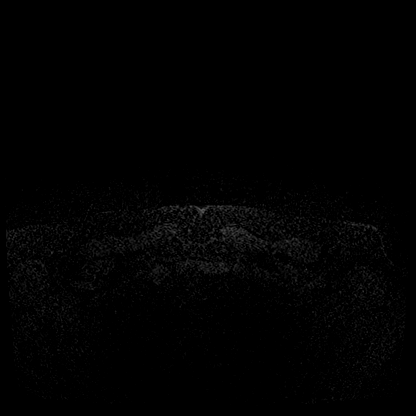

[Series 6: T1 fat-sat · axial · 1.2mm · 0.84mm/px · z∈[-108,+64]mm · 5 of 144 slices shown (4 of 5)]
[im 1/144]
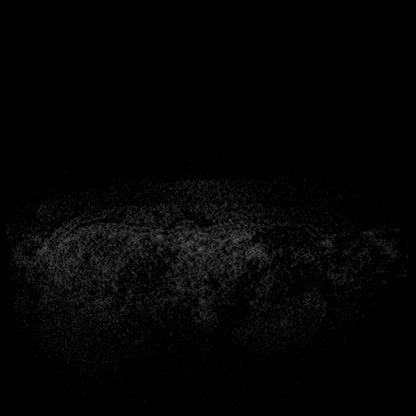
[im 36/144]
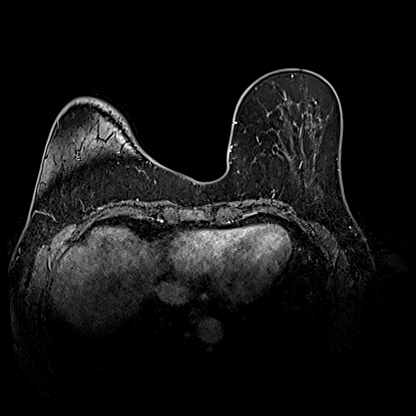
[im 72/144]
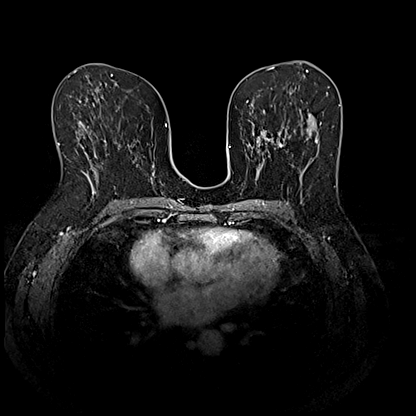
[im 108/144]
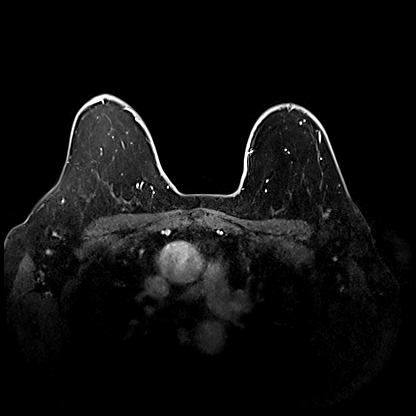
[im 144/144]
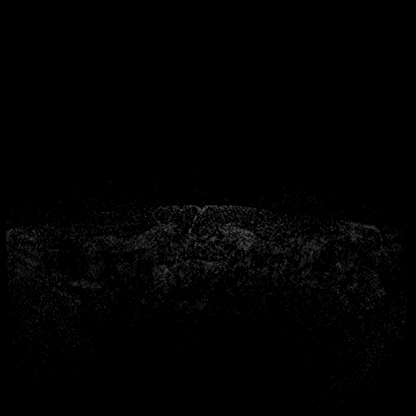

[Series 7: T1 · axial · 1.2mm · 0.84mm/px · z∈[-108,+64]mm · 5 of 144 slices shown (1 of 3)]
[im 1/144]
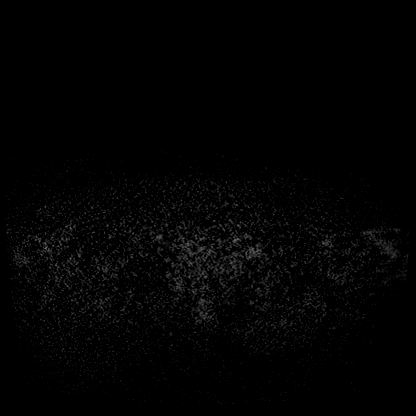
[im 36/144]
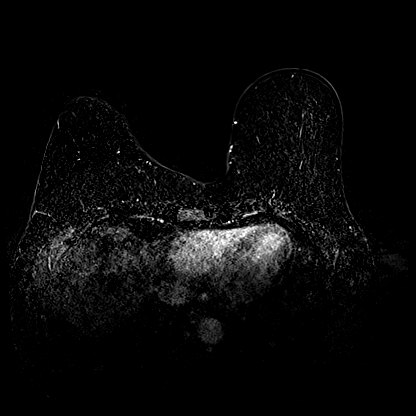
[im 72/144]
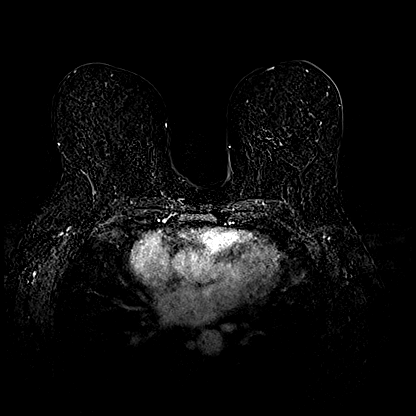
[im 108/144]
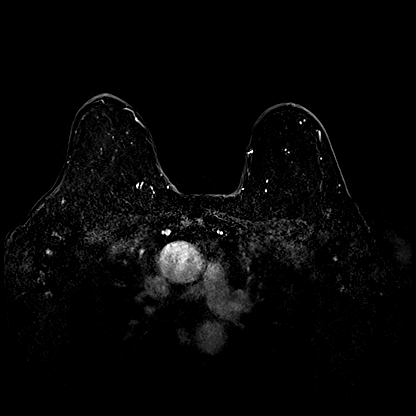
[im 144/144]
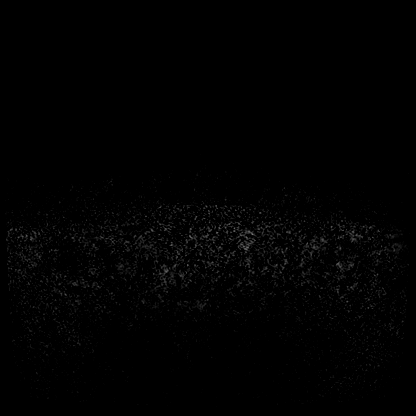

[Series 8: T1 · coronal · 350.0mm · 0.84mm/px · 1 of 3 slices shown (2 of 3)]
[im 1/3]
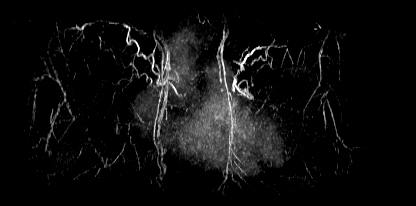

[Series 9: T1 · axial · 172.8mm · 0.84mm/px · 1 of 2 slices shown (3 of 3)]
[im 1/2]
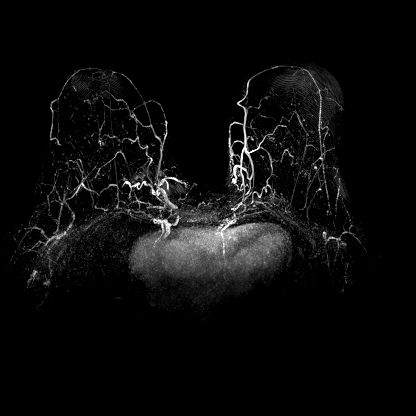

[Series 10: T1 fat-sat · axial · 1.2mm · 0.84mm/px · 1 of 144 slices shown (5 of 5)]
[im 1/144]
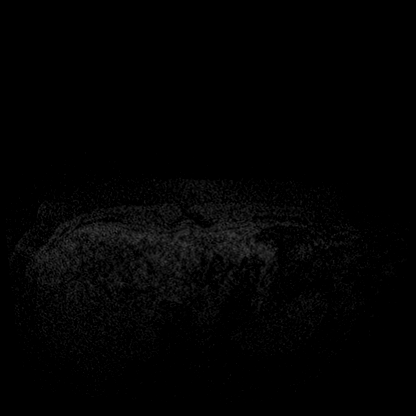

[29 of 48 positions shown; findings below may reference images not displayed]

Three-dimensional MR images were rendered by post-processing of the
original MR data on an independent workstation. The
three-dimensional MR images were interpreted, and findings are
reported in the following complete MRI report for this study. Three
dimensional images were evaluated at the independent interpreting
workstation using the DynaCAD thin client.
FINDINGS: Breast composition: b. Scattered fibroglandular tissue.

Background parenchymal enhancement: Mild to moderate.

RIGHT breast: No suspicious mass or abnormal enhancement.

LEFT breast: Interval complete imaging response to neoadjuvant
chemotherapy as there is no residual enhancing mass in the UPPER
INNER QUADRANT at the site of the blooming artifact related to the
biopsy clip.

No suspicious mass or abnormal enhancement elsewhere.

Lymph nodes: The biopsy-proven metastatic lymph node in the LEFT
axilla has normalized in appearance after neoadjuvant chemotherapy.
The 2 adjacent lymph nodes which were previously abnormal have also
normalized in appearance.

No new or suspicious lymphadenopathy elsewhere.

Ancillary findings: Benign cyst in the LEFT hepatic lobe (as noted
on prior CTs).
IMPRESSION: 1. Complete imaging response of the biopsy-proven malignancy in the
UPPER INNER QUADRANT of the LEFT breast.
2. Complete imaging response of the biopsy-proven metastatic LEFT
axillary lymph node and the 2 adjacent previously abnormal lymph
nodes in the LEFT axilla as they are all now normal in appearance.
3. Benign cyst in the LEFT hepatic lobe.

RECOMMENDATION:
Treatment plan.

BI-RADS CATEGORY  6: Known biopsy-proven malignancy.

## 2022-04-12 MED ORDER — GADOBUTROL 1 MMOL/ML IV SOLN
8.0000 mL | Freq: Once | INTRAVENOUS | Status: AC | PRN
  Administered 2022-04-12: 8 mL via INTRAVENOUS

## 2022-04-16 ENCOUNTER — Encounter: Payer: Self-pay | Admitting: *Deleted

## 2022-04-17 ENCOUNTER — Inpatient Hospital Stay: Payer: Commercial Managed Care - HMO | Admitting: Hematology

## 2022-04-17 ENCOUNTER — Other Ambulatory Visit: Payer: Self-pay

## 2022-04-17 ENCOUNTER — Encounter: Payer: Self-pay | Admitting: *Deleted

## 2022-04-17 ENCOUNTER — Inpatient Hospital Stay (HOSPITAL_BASED_OUTPATIENT_CLINIC_OR_DEPARTMENT_OTHER): Payer: Commercial Managed Care - HMO

## 2022-04-17 VITALS — BP 153/84 | HR 88 | Temp 98.4°F | Resp 18 | Ht 65.0 in | Wt 175.0 lb

## 2022-04-17 DIAGNOSIS — C50212 Malignant neoplasm of upper-inner quadrant of left female breast: Secondary | ICD-10-CM | POA: Diagnosis not present

## 2022-04-17 DIAGNOSIS — Z170 Estrogen receptor positive status [ER+]: Secondary | ICD-10-CM | POA: Diagnosis not present

## 2022-04-17 DIAGNOSIS — Z95828 Presence of other vascular implants and grafts: Secondary | ICD-10-CM

## 2022-04-17 LAB — CMP (CANCER CENTER ONLY)
ALT: 24 U/L (ref 0–44)
AST: 18 U/L (ref 15–41)
Albumin: 4.2 g/dL (ref 3.5–5.0)
Alkaline Phosphatase: 92 U/L (ref 38–126)
Anion gap: 7 (ref 5–15)
BUN: 13 mg/dL (ref 8–23)
CO2: 25 mmol/L (ref 22–32)
Calcium: 9.5 mg/dL (ref 8.9–10.3)
Chloride: 109 mmol/L (ref 98–111)
Creatinine: 0.71 mg/dL (ref 0.44–1.00)
GFR, Estimated: >60 mL/min (ref >60)
Glucose, Bld: 114 mg/dL — ABNORMAL HIGH (ref 70–99)
Potassium: 3.6 mmol/L (ref 3.5–5.1)
Sodium: 141 mmol/L (ref 135–145)
Total Bilirubin: 0.4 mg/dL (ref 0.3–1.2)
Total Protein: 6.9 g/dL (ref 6.5–8.1)

## 2022-04-17 LAB — CBC WITH DIFFERENTIAL (CANCER CENTER ONLY)
Abs Immature Granulocytes: 0.01 10*3/uL (ref 0.00–0.07)
Basophils Absolute: 0 10*3/uL (ref 0.0–0.1)
Basophils Relative: 1 %
Eosinophils Absolute: 0.4 10*3/uL (ref 0.0–0.5)
Eosinophils Relative: 6 %
HCT: 30.3 % — ABNORMAL LOW (ref 36.0–46.0)
Hemoglobin: 10.2 g/dL — ABNORMAL LOW (ref 12.0–15.0)
Immature Granulocytes: 0 %
Lymphocytes Relative: 16 %
Lymphs Abs: 0.9 10*3/uL (ref 0.7–4.0)
MCH: 28.2 pg (ref 26.0–34.0)
MCHC: 33.7 g/dL (ref 30.0–36.0)
MCV: 83.7 fL (ref 80.0–100.0)
Monocytes Absolute: 0.3 10*3/uL (ref 0.1–1.0)
Monocytes Relative: 5 %
Neutro Abs: 4.1 10*3/uL (ref 1.7–7.7)
Neutrophils Relative %: 72 %
Platelet Count: 228 10*3/uL (ref 150–400)
RBC: 3.62 MIL/uL — ABNORMAL LOW (ref 3.87–5.11)
RDW: 16.5 % — ABNORMAL HIGH (ref 11.5–15.5)
WBC Count: 5.6 10*3/uL (ref 4.0–10.5)
nRBC: 0 % (ref 0.0–0.2)

## 2022-04-17 MED ORDER — ANASTROZOLE 1 MG PO TABS: 1.0000 mg | ORAL_TABLET | Freq: Every day | ORAL | 2 refills | Status: DC

## 2022-04-17 MED ORDER — APIXABAN 5 MG PO TABS: ORAL_TABLET | ORAL | 2 refills | Status: DC

## 2022-04-17 MED ORDER — SODIUM CHLORIDE 0.9% FLUSH
10.0000 mL | Freq: Once | INTRAVENOUS | Status: AC | PRN
  Administered 2022-04-17: 10 mL

## 2022-04-25 ENCOUNTER — Other Ambulatory Visit: Payer: Self-pay | Admitting: Nurse Practitioner

## 2022-04-30 ENCOUNTER — Telehealth: Payer: Self-pay

## 2022-04-30 NOTE — Telephone Encounter (Signed)
This nurse received a message from this patient stating that she has been burning when she urinates, and having vaginal itching.  Patient is wanting to know what can she take for the symptoms.  This nurse made MD aware.

## 2022-05-14 ENCOUNTER — Other Ambulatory Visit: Payer: Self-pay

## 2022-05-14 ENCOUNTER — Ambulatory Visit: Payer: Self-pay | Admitting: Surgery

## 2022-05-14 DIAGNOSIS — C50912 Malignant neoplasm of unspecified site of left female breast: Secondary | ICD-10-CM

## 2022-05-15 ENCOUNTER — Encounter: Payer: Self-pay | Admitting: *Deleted

## 2022-05-16 ENCOUNTER — Other Ambulatory Visit: Payer: Self-pay | Admitting: Surgery

## 2022-05-16 DIAGNOSIS — C50912 Malignant neoplasm of unspecified site of left female breast: Secondary | ICD-10-CM

## 2022-05-18 ENCOUNTER — Encounter: Payer: Self-pay | Admitting: Hematology

## 2022-05-19 ENCOUNTER — Other Ambulatory Visit: Payer: Self-pay

## 2022-05-24 ENCOUNTER — Other Ambulatory Visit: Payer: Self-pay

## 2022-05-24 ENCOUNTER — Ambulatory Visit: Payer: Commercial Managed Care - HMO | Admitting: Physical Therapy

## 2022-05-25 ENCOUNTER — Other Ambulatory Visit: Payer: Self-pay

## 2022-05-28 ENCOUNTER — Inpatient Hospital Stay (HOSPITAL_BASED_OUTPATIENT_CLINIC_OR_DEPARTMENT_OTHER): Payer: Medicare HMO | Admitting: Hematology

## 2022-05-28 ENCOUNTER — Encounter: Payer: Self-pay | Admitting: Hematology

## 2022-05-28 ENCOUNTER — Inpatient Hospital Stay: Payer: Medicare HMO | Attending: Hematology

## 2022-05-28 ENCOUNTER — Other Ambulatory Visit: Payer: Self-pay

## 2022-05-28 ENCOUNTER — Inpatient Hospital Stay: Payer: Medicare HMO

## 2022-05-28 VITALS — BP 156/82 | HR 80 | Temp 98.4°F | Resp 18 | Ht 65.0 in | Wt 174.8 lb

## 2022-05-28 DIAGNOSIS — I10 Essential (primary) hypertension: Secondary | ICD-10-CM | POA: Diagnosis not present

## 2022-05-28 DIAGNOSIS — Z882 Allergy status to sulfonamides status: Secondary | ICD-10-CM | POA: Insufficient documentation

## 2022-05-28 DIAGNOSIS — M2550 Pain in unspecified joint: Secondary | ICD-10-CM | POA: Diagnosis not present

## 2022-05-28 DIAGNOSIS — K7689 Other specified diseases of liver: Secondary | ICD-10-CM | POA: Diagnosis not present

## 2022-05-28 DIAGNOSIS — Z79899 Other long term (current) drug therapy: Secondary | ICD-10-CM | POA: Insufficient documentation

## 2022-05-28 DIAGNOSIS — Z888 Allergy status to other drugs, medicaments and biological substances status: Secondary | ICD-10-CM | POA: Insufficient documentation

## 2022-05-28 DIAGNOSIS — I2699 Other pulmonary embolism without acute cor pulmonale: Secondary | ICD-10-CM | POA: Insufficient documentation

## 2022-05-28 DIAGNOSIS — C50212 Malignant neoplasm of upper-inner quadrant of left female breast: Secondary | ICD-10-CM | POA: Diagnosis not present

## 2022-05-28 DIAGNOSIS — Z79811 Long term (current) use of aromatase inhibitors: Secondary | ICD-10-CM | POA: Insufficient documentation

## 2022-05-28 DIAGNOSIS — Z170 Estrogen receptor positive status [ER+]: Secondary | ICD-10-CM

## 2022-05-28 DIAGNOSIS — Z7901 Long term (current) use of anticoagulants: Secondary | ICD-10-CM | POA: Insufficient documentation

## 2022-05-28 DIAGNOSIS — Z95828 Presence of other vascular implants and grafts: Secondary | ICD-10-CM

## 2022-05-28 DIAGNOSIS — R232 Flushing: Secondary | ICD-10-CM | POA: Insufficient documentation

## 2022-05-28 DIAGNOSIS — F419 Anxiety disorder, unspecified: Secondary | ICD-10-CM | POA: Insufficient documentation

## 2022-05-28 LAB — CBC WITH DIFFERENTIAL (CANCER CENTER ONLY)
Abs Immature Granulocytes: 0.01 10*3/uL (ref 0.00–0.07)
Basophils Absolute: 0 10*3/uL (ref 0.0–0.1)
Basophils Relative: 0 %
Eosinophils Absolute: 0.3 10*3/uL (ref 0.0–0.5)
Eosinophils Relative: 5 %
HCT: 33.3 % — ABNORMAL LOW (ref 36.0–46.0)
Hemoglobin: 11.5 g/dL — ABNORMAL LOW (ref 12.0–15.0)
Immature Granulocytes: 0 %
Lymphocytes Relative: 21 %
Lymphs Abs: 1.1 10*3/uL (ref 0.7–4.0)
MCH: 29.2 pg (ref 26.0–34.0)
MCHC: 34.5 g/dL (ref 30.0–36.0)
MCV: 84.5 fL (ref 80.0–100.0)
Monocytes Absolute: 0.3 10*3/uL (ref 0.1–1.0)
Monocytes Relative: 6 %
Neutro Abs: 3.7 10*3/uL (ref 1.7–7.7)
Neutrophils Relative %: 68 %
Platelet Count: 229 10*3/uL (ref 150–400)
RBC: 3.94 MIL/uL (ref 3.87–5.11)
RDW: 15.7 % — ABNORMAL HIGH (ref 11.5–15.5)
WBC Count: 5.4 10*3/uL (ref 4.0–10.5)
nRBC: 0 % (ref 0.0–0.2)

## 2022-05-28 LAB — CMP (CANCER CENTER ONLY)
ALT: 29 U/L (ref 0–44)
AST: 17 U/L (ref 15–41)
Albumin: 4.5 g/dL (ref 3.5–5.0)
Alkaline Phosphatase: 104 U/L (ref 38–126)
Anion gap: 4 — ABNORMAL LOW (ref 5–15)
BUN: 15 mg/dL (ref 8–23)
CO2: 31 mmol/L (ref 22–32)
Calcium: 9.6 mg/dL (ref 8.9–10.3)
Chloride: 106 mmol/L (ref 98–111)
Creatinine: 0.79 mg/dL (ref 0.44–1.00)
GFR, Estimated: >60 mL/min (ref >60)
Glucose, Bld: 100 mg/dL — ABNORMAL HIGH (ref 70–99)
Potassium: 3.9 mmol/L (ref 3.5–5.1)
Sodium: 141 mmol/L (ref 135–145)
Total Bilirubin: 0.5 mg/dL (ref 0.3–1.2)
Total Protein: 7.6 g/dL (ref 6.5–8.1)

## 2022-05-28 LAB — TYPE AND SCREEN
ABO/RH(D): B POS
Antibody Screen: NEGATIVE

## 2022-05-28 MED ORDER — ENOXAPARIN SODIUM 120 MG/0.8ML IJ SOSY: 120.0000 mg | PREFILLED_SYRINGE | INTRAMUSCULAR | 0 refills | Status: DC

## 2022-05-28 MED ORDER — SODIUM CHLORIDE 0.9% FLUSH
10.0000 mL | Freq: Once | INTRAVENOUS | Status: AC
  Administered 2022-05-28: 10 mL

## 2022-05-28 MED ORDER — HEPARIN SOD (PORK) LOCK FLUSH 100 UNIT/ML IV SOLN
500.0000 [IU] | Freq: Once | INTRAVENOUS | Status: AC
  Administered 2022-05-28: 500 [IU]

## 2022-05-28 NOTE — Progress Notes (Signed)
[place]   [phone] [phone]   Clinic Follow up Note   Patient Care Team: Everardo Beals, NP as PCP - General Erroll Luna, MD as Consulting Physician (General Surgery) Truitt Merle, MD as Consulting Physician (Hematology) Gery Pray, MD as Consulting Physician (Radiation Oncology) [name], RN as Oncology Nurse Navigator [name], RN as Oncology Nurse Navigator  Date of Service:  05/28/2022  CHIEF COMPLAINT: f/u of left breast cancer  CURRENT THERAPY:  Anastrozole, starting 04/17/22  ASSESSMENT & PLAN:  Cassandra Brennan is a 66 y.o. post-menopausal female with   1. 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 B/l diagnostic MM and left [place] 10/19/21 showed: 2 cm mass at 10 o'clock; 5 mm indeterminate satellite mass at 12 o'clock; palpable left axilla mass corresponds with large lymph node. Biopsy 10/27/21 confirmed invasive ductal carcinoma with necrosis to both 10 o'clock and lymph node, grade 3. ER weakly positive (30%). -breast MRI 11/15/21 shows the biopsy proven malignancy in the upper inner left breat measuring 1.9 cm, a biopsy proven malignant 4.3 cm left axillary LN and 2 other abnormal LNs measuring 1.3 and 1.5 cm, and a 1.3 liver mass likely representing a cyst.  -CT CAP 11/23/21 shows known left breast cancer and bulky axillary adenopathy, a liver cyst, and tiny indeterminate liver nodules too small to characterize. We will monitor these in the future. No evidence of distant metastasis.  -Bone scan is negative for osseous metastasis.  -Baseline echo on 11/22/21 is normal, LVEF 55-60%. -Left breast mass in the upper inner quadrant measured 2 cm and 2 palpable lymph nodes largest measuring 4.5 cm on 2/7 the day she began neoadjuvant chemo. -She received 4 cycles of neoadjuvant AC 09/27/21 - 01/08/22,  tolerated moderate well with fatigue and nausea. Breast mass and adenopathy are no longer palpable. -she started weekly taxol 01/22/22. She was hospitalized on 03/22/22 for massive PE and cardiogenic shock. Given likely relation of these to chemo and severity of the PE, Taxol was discontinued after 03/20/22 dose. -post-treatment breast MRI on 04/12/22 showed complete imaging response to treatment. -she is scheduled for left lumpectomy on 07/10/22 under Dr. Brantley Stage, postponed due to massive PE. Because of the delay, she started anastrozole on 04/17/22. She is tolerating it well -She reports she is recovering well from chemo and hospitalization. Labs reviewed, improving off chemo.    2. Massive B/L PE -s/p C9 taxol, she experienced fatigue, tachycardia, and dyspnea, admitted 03/22/22. CT showed large bilateral pulmonary emboli with right heart strain. -previously discussed this is likely related to underlying malignancy and chemotherapy. Discontinued taxol. -Status post thrombolysis -Tolerating Eliquis well, will continue for at least 6 months, possible indefinitely -I recommend transitioning to bridging Lovenox for 3 days before her surgery. Her daughter is a Marine scientist and can administer this to her.   3. Hypertension and anxiety -Continue medications and f/u with PCP     PLAN: -continue anastrozole -bridge eliquis to Lovenox as instructed, she will stop eliquis after 9/14, and start Lovenox 183m daily on 9/15 for 3 days then stop. REstart Eliquis after surgery per Dr. CBrantley Stage -lumpectomy 07/10/22 with Dr. CBrantley Stage -lab and f/u in early 07/2022   No problem-specific Assessment & Plan notes found for this encounter.   SUMMARY OF ONCOLOGIC HISTORY: Oncology History Overview Note   Cancer Staging  Malignant neoplasm of upper-inner  quadrant of left breast in female, estrogen receptor positive ([place]) Staging form: Breast, AJCC 8th Edition - Clinical stage from 10/27/2021: Stage IIB (cT1c, cN1, cM0, G3,  ER+, PR-, HER2-) - Signed by Truitt Merle, MD on 11/07/2021    Malignant neoplasm of upper-inner quadrant of left breast in female, estrogen receptor positive ([place])  10/19/2021 Mammogram   CLINICAL DATA:  Patient presents for palpable left axillary abnormality.   EXAM: DIGITAL DIAGNOSTIC BILATERAL MAMMOGRAM WITH TOMOSYNTHESIS AND CAD; ULTRASOUND LEFT BREAST LIMITED  IMPRESSION: Suspicious left breast mass 10 o'clock position.   Adjacent suspicious satellite nodule left breast 12 o'clock position.   Palpable mass left axilla corresponds with a large lymph node.   10/27/2021 Initial Biopsy   Diagnosis 1. Breast, left, needle core biopsy, 12 o'clock, ribbon - FIBROADENOMA - NO MALIGNANCY IDENTIFIED 2. Breast, left, needle core biopsy, 10 o'clock, coil - INVASIVE DUCTAL CARCINOMA WITH NECROSIS - DUCTAL CARCINOMA IN SITU - SEE COMMENT 3. Lymph node, needle/core biopsy, left axilla, tribell - INVASIVE DUCTAL CARCINOMA WITH NECROSIS - NO NODAL TISSUE IDENTIFIED - SEE COMMENT Microscopic Comment 2. and 2. Based on the biopsy, the carcinoma appears Nottingham grade 3 of 3 and measures 0.8 cm in greatest linear extent.  3. PROGNOSTIC INDICATORS Results: The tumor cells are NEGATIVE for Her2 (1+). Estrogen Receptor: 30%, POSITIVE, WEAK STAINING INTENSITY Progesterone Receptor: 0%, NEGATIVE Proliferation Marker Ki67: 60%   10/27/2021 Cancer Staging   Staging form: Breast, AJCC 8th Edition - Clinical stage from 10/27/2021: Stage IIB (cT1c, cN1, cM0, G3, ER+, PR-, HER2-) - Signed by [name], NP on 11/28/2021 Stage prefix: Initial diagnosis Histologic grading system: 3 grade system   11/06/2021 Initial Diagnosis   Malignant neoplasm of upper-inner quadrant of left breast in female, estrogen receptor positive ([place])   11/15/2021 Breast MRI   IMPRESSION: 1.9 cm mass in the upper-inner quadrant of the left breast corresponding with the known invasive ductal carcinoma. Enlarged left  axillary lymph node corresponding with known metastatic disease.   RECOMMENDATION: Treatment planning of the known left breast cancer and axillary metastasis is recommended.   Additional imaging evaluation of the mass in the liver is recommended with CT with liver mass protocol or MRI.   11/22/2021 Imaging   Bone scan IMPRESSION: No scintigraphic evidence of bony metastatic disease. Degenerative changes as above.   11/22/2021 Echocardiogram   Baseline echo LVEF 55-60%, normal GLS -21.7%   11/23/2021 Imaging   CT CAP IMPRESSION: 1. Bulky lymphadenopathy in the left axilla including a 5.0 x 2.8 cm lymph node. 2. 15 mm nodule identified in the upper inner quadrant of the left breast. 3. No suspicious pulmonary nodule or mass. No evidence for metastatic disease in the abdomen or pelvis. 4. 11 mm cyst in the dome of the left liver. Adjacent tiny hypoattenuating lesions in the right liver are too small to characterize, but likely benign. Attention on follow-up recommended. 5. Prominent stool volume raises the question of clinical constipation.   11/28/2021 -  Chemotherapy   Patient is on Treatment Plan : BREAST ADJUVANT DOSE DENSE AC q14d / PACLitaxel q7d      Genetic Testing   [facility] CancerNext was Negative. Report date is 11/26/2021.   The CancerNext gene panel offered by [employer] includes sequencing, rearrangement analysis, and RNA analysis for the following 36 genes:   APC, ATM, AXIN2, BARD1, BMPR1A, BRCA1, BRCA2, BRIP1, CDH1, CDK4, CDKN2A, CHEK2, DICER1, HOXB13, EPCAM, GREM1, MLH1, MSH2, MSH3, MSH6, MUTYH, NBN, NF1, NTHL1, PALB2, PMS2, POLD1,  POLE, PTEN, RAD51C, RAD51D, RECQL, SMAD4, SMARCA4, STK11, and TP53.       INTERVAL HISTORY:  Cassandra Brennan is here for a follow up of breast cancer. She was last seen by me on 04/17/22. She presents to the clinic alone. She reports she is recovering well from chemo. She notes her energy is returning. She reports she is having hot flashes at night,  which she feels are tolerable, and some mild joint pain that resolves on its own during the day. She notes she has not needed to take any pain medicine for the joint pain.   All other systems were reviewed with the patient and are negative.  MEDICAL HISTORY:  Past Medical History:  Diagnosis Date   Acute dyspnea    Acute metabolic encephalopathy    Anxiety    Arthritis    right knee    Elevated troponin    GERD (gastroesophageal reflux disease)    Hyperlipidemia    Hypertension    Lactic acidosis    Obstructive cardiovascular shock ([place]) 03/22/2022   Positive colorectal cancer screening using Cologuard test     SURGICAL HISTORY: Past Surgical History:  Procedure Laterality Date   BREAST CYST EXCISION Right    pt stated in [place]     other GI testing     unsure but had to drink a chalky drink    PORTACATH PLACEMENT N/A 11/27/2021   Procedure: INSERTION PORT-A-CATH;  Surgeon: Erroll Luna, MD;  Location: [facility];  Service: General;  Laterality: N/A;   TONSILLECTOMY     TUBAL LIGATION      I have reviewed the social history and family history with the patient and they are unchanged from previous note.  ALLERGIES:  is allergic to prednisone, rosuvastatin, other, robitussin (alcohol free) [guaifenesin], and sulfa antibiotics.  MEDICATIONS:  Current Outpatient Medications  Medication Sig Dispense Refill   enoxaparin (LOVENOX) 120 MG/0.8ML injection Inject 0.8 mLs (120 mg total) into the skin daily. Take in mornings. Start on 07/06/2022 for 3 days, stop Eliquis after evening dose on 07/05/2022. 2.4 mL 0   acetaminophen (TYLENOL) 500 MG tablet Take 500 mg by mouth every 8 (eight) hours as needed for moderate pain.     ALPRAZolam (XANAX) 0.25 MG tablet Take 1 tablet (0.25 mg total) by mouth daily as needed for anxiety. 30 tablet 0   anastrozole (ARIMIDEX) 1 MG tablet Take 1 tablet (1 mg total) by mouth daily. 30 tablet 2   apixaban (ELIQUIS) 5 MG TABS tablet Take 1  tablet (40m) twice daily 60 tablet 2   lidocaine-prilocaine (EMLA) cream Apply to affected area once 30 g 3   metoprolol succinate (TOPROL-XL) 100 MG 24 hr tablet Take 100 mg by mouth daily. Take with or immediately following a meal.     Multiple Vitamins-Minerals (MULTIVITAMIN ADULTS) TABS Take 1 tablet by mouth daily.     ondansetron (ZOFRAN) 8 MG tablet Take 1 tablet (8 mg total) by mouth 2 (two) times daily as needed. Start on the third day after chemotherapy. (Patient taking differently: Take 8 mg by mouth 2 (two) times daily as needed for nausea or vomiting. Start on the third day after chemotherapy.) 30 tablet 1   pravastatin (PRAVACHOL) 20 MG tablet Take 1 tablet (20 mg total) by mouth daily. 30 tablet 0   prochlorperazine (COMPAZINE) 10 MG tablet Take 1 tablet (10 mg total) by mouth every 6 (six) hours as needed (Nausea or vomiting). 30 tablet 1  No current facility-administered medications for this visit.    PHYSICAL EXAMINATION: ECOG PERFORMANCE STATUS: 0 - Asymptomatic  Vitals:   05/28/22 1029  BP: (!) 156/82  Pulse: 80  Resp: 18  Temp: 98.4 F (36.9 C)  SpO2: 100%   Wt Readings from Last 3 Encounters:  05/28/22 174 lb 12.8 oz (79.3 kg)  04/17/22 175 lb (79.4 kg)  03/25/22 182 lb 3.2 oz (82.6 kg)     GENERAL:alert, no distress and comfortable SKIN: skin color, texture, turgor are normal, no rashes or significant lesions EYES: normal, Conjunctiva are pink and non-injected, sclera clear  NECK: supple, thyroid normal size, non-tender, without nodularity LYMPH:  no palpable lymphadenopathy in the cervical, axillary LUNGS: clear to auscultation and percussion with normal breathing effort HEART: regular rate & rhythm and no murmurs and no lower extremity edema ABDOMEN:abdomen soft, non-tender and normal bowel sounds Musculoskeletal:no cyanosis of digits and no clubbing  NEURO: alert & oriented x 3 with fluent speech, no focal motor/sensory deficits BREAST: No palpable  mass, nodules or adenopathy bilaterally. Breast exam benign.   LABORATORY DATA:  I have reviewed the data as listed    Latest Ref Rng & Units 05/28/2022    9:42 AM 04/17/2022    8:07 AM 03/26/2022    7:48 AM  CBC  WBC 4.0 - 10.5 K/uL 5.4  5.6  4.6   Hemoglobin 12.0 - 15.0 g/dL 11.5  10.2  8.1   Hematocrit 36.0 - 46.0 % 33.3  30.3  24.0   Platelets 150 - 400 K/uL 229  228  245         Latest Ref Rng & Units 05/28/2022    9:42 AM 04/17/2022    8:07 AM 03/26/2022    7:48 AM  CMP  Glucose 70 - 99 mg/dL 100  114  170   BUN 8 - 23 mg/dL _0 Creatinine 0.44 - 1.00 mg/dL 0.79  0.71  0.78   Sodium 135 - 145 mmol/L 141  141  141   Potassium 3.5 - 5.1 mmol/L 3.9  3.6  3.3   Chloride 98 - 111 mmol/L 106  109  106   CO2 22 - 32 mmol/L _1 Calcium 8.9 - 10.3 mg/dL 9.6  9.5  9.0   Total Protein 6.5 - 8.1 g/dL 7.6  6.9  6.0   Total Bilirubin 0.3 - 1.2 mg/dL 0.5  0.4  0.7   Alkaline Phos 38 - 126 U/L 104  92  94   AST 15 - 41 U/L 17  18  78   ALT 0 - 44 U/L 29  24  226       RADIOGRAPHIC STUDIES: I have personally reviewed the radiological images as listed and agreed with the findings in the report. No results found.    No orders of the defined types were placed in this encounter.  All questions were answered. The patient knows to call the clinic with any problems, questions or concerns. No barriers to learning was detected. The total time spent in the appointment was 30 minutes.     Truitt Merle, MD 05/28/2022   I, Wilburn Mylar, am acting as scribe for Truitt Merle, MD.   I have reviewed the above documentation for accuracy and completeness, and I agree with the above.

## 2022-05-29 ENCOUNTER — Encounter: Payer: Self-pay | Admitting: *Deleted

## 2022-05-29 ENCOUNTER — Telehealth: Payer: Self-pay | Admitting: Hematology

## 2022-05-29 DIAGNOSIS — C50212 Malignant neoplasm of upper-inner quadrant of left female breast: Secondary | ICD-10-CM

## 2022-05-29 NOTE — Telephone Encounter (Signed)
Per 8/7 los called and spoke to pt about appointment pt confirmed appointment

## 2022-05-30 ENCOUNTER — Other Ambulatory Visit: Payer: Self-pay

## 2022-05-31 ENCOUNTER — Other Ambulatory Visit: Payer: Self-pay

## 2022-06-03 ENCOUNTER — Other Ambulatory Visit: Payer: Self-pay

## 2022-06-09 ENCOUNTER — Encounter (HOSPITAL_COMMUNITY): Payer: Self-pay

## 2022-06-09 ENCOUNTER — Ambulatory Visit (HOSPITAL_COMMUNITY)
Admission: EM | Admit: 2022-06-09 | Discharge: 2022-06-09 | Disposition: A | Discharge status: Home / Self Care | Payer: Medicare HMO | Attending: Internal Medicine | Admitting: Internal Medicine

## 2022-06-09 DIAGNOSIS — R350 Frequency of micturition: Secondary | ICD-10-CM | POA: Insufficient documentation

## 2022-06-09 DIAGNOSIS — N3090 Cystitis, unspecified without hematuria: Secondary | ICD-10-CM | POA: Diagnosis present

## 2022-06-09 DIAGNOSIS — R300 Dysuria: Secondary | ICD-10-CM | POA: Diagnosis present

## 2022-06-09 LAB — POCT URINALYSIS DIPSTICK, ED / UC
Bilirubin Urine: NEGATIVE
Bilirubin Urine: NEGATIVE
Glucose, UA: NEGATIVE mg/dL
Glucose, UA: NEGATIVE mg/dL
Ketones, ur: NEGATIVE mg/dL
Ketones, ur: NEGATIVE mg/dL
Nitrite: NEGATIVE
Nitrite: NEGATIVE
Protein, ur: NEGATIVE mg/dL
Protein, ur: NEGATIVE mg/dL
Specific Gravity, Urine: 1.01 (ref 1.005–1.030)
Specific Gravity, Urine: <=1.005 (ref 1.005–1.030)
Urobilinogen, UA: 0.2 mg/dL (ref 0.0–1.0)
Urobilinogen, UA: 0.2 mg/dL (ref 0.0–1.0)
pH: 5 (ref 5.0–8.0)
pH: 5.5 (ref 5.0–8.0)

## 2022-06-09 MED ORDER — NITROFURANTOIN MONOHYD MACRO 100 MG PO CAPS: 100.0000 mg | ORAL_CAPSULE | Freq: Two times a day (BID) | ORAL | 0 refills | Status: DC

## 2022-06-09 MED ORDER — PHENAZOPYRIDINE HCL 200 MG PO TABS
200.0000 mg | ORAL_TABLET | Freq: Three times a day (TID) | ORAL | 0 refills | Status: DC
Start: 2022-06-09 — End: 2022-09-10

## 2022-06-09 NOTE — ED Triage Notes (Signed)
Pt present to the office for dysuria x 1 day.

## 2022-06-09 NOTE — Discharge Instructions (Addendum)
Advised to increase fluid intake in order to flush the urinary system. Advised take the Macrobid 1 every 12 hours to treat the bladder infection. Advised to take the Pyridium 1 every 6 hours which will help to relieve the burning on urination. A culture of the urine has sent to the lab, this takes to 48 hours to grow out.  If you do not receive a call from this office within 48 hours it indicates that the bacteria will treat the urinary infection.

## 2022-06-09 NOTE — ED Provider Notes (Signed)
[place]    CSN: [id number] Arrival date & time: 06/09/22  1704      History   Chief Complaint No chief complaint on file.   HPI Cassandra Brennan is a 66 y.o. female.   66 year old female presents with dysuria.  Patient indicates for the past 24 hours she has been having frequency, urgency, and dysuria.  Patient indicates that she has also had mild lower back pain.  She has not had any fever or chills, no nausea or vomiting.  Patient also indicates that she has had a cystitis episode about a month ago which she took antibiotics for an resolved.  Patient indicates she is on several different medications which she is taken in some of these are cancer type medications.  She is concerned about these possibly causing her symptoms.     Past Medical History:  Diagnosis Date   Acute dyspnea    Acute metabolic encephalopathy    Anxiety    Arthritis    right knee    Elevated troponin    GERD (gastroesophageal reflux disease)    Hyperlipidemia    Hypertension    Lactic acidosis    Obstructive cardiovascular shock ([place]) 03/22/2022   Positive colorectal cancer screening using Cologuard test     Patient Active Problem List   Diagnosis Date Noted   Right leg DVT ([place]) 03/25/2022   Pneumonia 03/23/2022   AKI (acute kidney injury) ([place]) 03/23/2022   Acute massive pulmonary embolism ([place]) 03/22/2022   GERD (gastroesophageal reflux disease)    Chronic anemia    Cardiogenic shock ([place])    Anxiety 12/08/2021   Genetic testing 11/30/2021   Port-A-Cath in place 11/28/2021   Malignant neoplasm of upper-inner quadrant of left breast in female, estrogen receptor positive ([place]) 11/06/2021   Hypertension 03/15/2020    Past Surgical History:  Procedure Laterality Date   BREAST CYST EXCISION Right    pt stated in [place]     other GI testing     unsure but had to drink a chalky drink    PORTACATH PLACEMENT N/A 11/27/2021   Procedure: INSERTION PORT-A-CATH;   Surgeon: Erroll Luna, MD;  Location: [facility];  Service: General;  Laterality: N/A;   TONSILLECTOMY     TUBAL LIGATION      OB History   No obstetric history on file.      Home Medications    Prior to Admission medications   Medication Sig Start Date End Date Taking? Authorizing Provider  nitrofurantoin, macrocrystal-monohydrate, (MACROBID) 100 MG capsule Take 1 capsule (100 mg total) by mouth 2 (two) times daily. 06/09/22  Yes Nyoka Lint, PA-C  phenazopyridine (PYRIDIUM) 200 MG tablet Take 1 tablet (200 mg total) by mouth 3 (three) times daily. 06/09/22  Yes Nyoka Lint, PA-C  acetaminophen (TYLENOL) 500 MG tablet Take 500 mg by mouth every 8 (eight) hours as needed for moderate pain.    [provider]  ALPRAZolam Duanne Moron) 0.25 MG tablet Take 1 tablet (0.25 mg total) by mouth daily as needed for anxiety. 04/02/22   [name], NP  anastrozole (ARIMIDEX) 1 MG tablet Take 1 tablet (1 mg total) by mouth daily. 04/17/22   Truitt Merle, MD  apixaban Arne Cleveland) 5 MG TABS tablet Take 1 tablet ('5mg'$ ) twice daily 04/17/22   Truitt Merle, MD  enoxaparin (LOVENOX) 120 MG/0.8ML injection Inject 0.8 mLs (120 mg total) into the skin daily. Take in mornings. Start on 07/06/2022 for 3 days, stop  Eliquis after evening dose on 07/05/2022. 05/28/22   Truitt Merle, MD  lidocaine-prilocaine (EMLA) cream Apply to affected area once 11/16/21   Truitt Merle, MD  metoprolol succinate (TOPROL-XL) 100 MG 24 hr tablet Take 100 mg by mouth daily. Take with or immediately following a meal.    [provider]  Multiple Vitamins-Minerals (MULTIVITAMIN ADULTS) TABS Take 1 tablet by mouth daily.    [provider]  ondansetron (ZOFRAN) 8 MG tablet Take 1 tablet (8 mg total) by mouth 2 (two) times daily as needed. Start on the third day after chemotherapy. Patient taking differently: Take 8 mg by mouth 2 (two) times daily as needed for nausea or vomiting. Start on the third day after chemotherapy. 11/08/21    Truitt Merle, MD  pravastatin (PRAVACHOL) 20 MG tablet Take 1 tablet (20 mg total) by mouth daily. 03/15/20   Cheyenne Adas, NP  prochlorperazine (COMPAZINE) 10 MG tablet Take 1 tablet (10 mg total) by mouth every 6 (six) hours as needed (Nausea or vomiting). 11/08/21   Truitt Merle, MD    Family History Family History  Problem Relation Age of Onset   Colon polyps Sister    Colon polyps Sister    Colon polyps Sister    Colon cancer Maternal Uncle        dx. 46s   Throat cancer Maternal Uncle    Colon cancer Cousin        dx. >50   Gastric cancer Cousin        dx. >50   Breast cancer Cousin        dx. 82s   Kidney cancer Nephew    Esophageal cancer Neg Hx    Rectal cancer Neg Hx    Stomach cancer Neg Hx     Social History Social History   Tobacco Use   Smoking status: Never   Smokeless tobacco: Never  Vaping Use   Vaping Use: Never used  Substance Use Topics   Alcohol use: No   Drug use: No     Allergies   Prednisone, Rosuvastatin, Other, Robitussin (alcohol free) [guaifenesin], and Sulfa antibiotics   Review of Systems Review of Systems  Genitourinary:  Positive for dysuria and frequency.     Physical Exam Triage Vital Signs ED Triage Vitals [06/09/22 1854]  Enc Vitals Group     BP (!) 163/94     Pulse Rate 82     Resp 18     Temp 98.1 F (36.7 C)     Temp Source Oral     SpO2 98 %     Weight      Height      Head Circumference      Peak Flow      Pain Score      Pain Loc      Pain Edu?      Excl. in [place]?    No data found.  Updated Vital Signs BP (!) 163/94 (BP Location: Left Arm)   Pulse 82   Temp 98.1 F (36.7 C) (Oral)   Resp 18   SpO2 98%   Visual Acuity Right Eye Distance:   Left Eye Distance:   Bilateral Distance:    Right Eye Near:   Left Eye Near:    Bilateral Near:     Physical Exam Constitutional:      Appearance: Normal appearance.  Abdominal:     General: Abdomen is flat. Bowel sounds are normal.     Palpations:  Abdomen is soft.     Tenderness: There is no abdominal tenderness.  Neurological:     Mental Status: She is alert.      UC Treatments / Results  Labs (all labs ordered are listed, but only abnormal results are displayed) Labs Reviewed  POCT URINALYSIS DIPSTICK, ED / UC - Abnormal; Notable for the following components:      Result Value   Hgb urine dipstick LARGE (*)    Leukocytes,Ua MODERATE (*)    All other components within normal limits  POCT URINALYSIS DIPSTICK, ED / UC - Abnormal; Notable for the following components:   Hgb urine dipstick LARGE (*)    Leukocytes,Ua SMALL (*)    All other components within normal limits  URINE CULTURE    EKG   Radiology No results found.  Procedures Procedures (including critical care time)  Medications Ordered in UC Medications - No data to display  Initial Impression / Assessment and Plan / UC Course  I have reviewed the triage vital signs and the nursing notes.  Pertinent labs & imaging results that were available during my care of the patient were reviewed by me and considered in my medical decision making (see chart for details).    Plan: 1.  Advised take the Macrobid 1 every 12 hours until completed to treat the cystitis. 2.  Advised take the Pyridium 1 every 6 hours to relieve the burning. 3.  Urine culture is pending. 4.  Advised to follow-up with PCP or return to urgent care if symptoms fail to improve. Final Clinical Impressions(s) / UC Diagnoses   Final diagnoses:  Dysuria  Frequency of urination  Cystitis     Discharge Instructions      Advised to increase fluid intake in order to flush the urinary system. Advised take the Macrobid 1 every 12 hours to treat the bladder infection. Advised to take the Pyridium 1 every 6 hours which will help to relieve the burning on urination. A culture of the urine has sent to the lab, this takes to 48 hours to grow out.  If you do not receive a call from this office  within 48 hours it indicates that the bacteria will treat the urinary infection.    ED Prescriptions     Medication Sig Dispense Auth. Provider   nitrofurantoin, macrocrystal-monohydrate, (MACROBID) 100 MG capsule Take 1 capsule (100 mg total) by mouth 2 (two) times daily. 10 capsule Nyoka Lint, PA-C   phenazopyridine (PYRIDIUM) 200 MG tablet Take 1 tablet (200 mg total) by mouth 3 (three) times daily. 6 tablet Nyoka Lint, PA-C      PDMP not reviewed this encounter.   Nyoka Lint, PA-C 06/09/22 1928

## 2022-06-10 LAB — URINE CULTURE: Culture: <10000 — AB

## 2022-06-18 ENCOUNTER — Telehealth: Payer: Self-pay

## 2022-06-18 NOTE — Telephone Encounter (Signed)
Pt called stating she's having 8/10 joint pain in her left upper thigh/groin area.  Pt would like to know what Dr. Burr Medico recommends.  Pt denied taking anything for pain, denied swelling and redness in the area.  Pt thought the joint pain was probably brought on by her Pravastatin, Anastrozole, and her shoes she was wearing.  Pt stated the pain is only when she walks.  Pt stated she's been on the Pravastatin for several years.  Pt just recently started Anastrozole at which time the left groin pain started and has gradually gotten worse.  Pt stated she changed her shoes which has decreased some of the pain she is experiencing when she walks.  Pt stated she has an appt on 06/19/2022 at the [facility] at which time she will probably purchase more supportive shoes based on her analysis there.  Instructed pt to take Tylenol for her pain and to apply Voltaren Gel to the left groin are to help with reducing the inflammation.  Also, gave pt a list of foods to avoid to help reduce the inflammation and instructed pt to apply warm compresses to the area.  Pt verbalized understanding of instructions.  Pt also asked if there were foods she should avoid while taking Eliquis and Lovenox.  Sent staff message to Oral chemo pharmacist Leron Croak [facility] regarding pt's question.  Wells Guiles responded with Lovenox does not have any food interacts but the pt should avoid Grapefruit and Grapefruit juice while taking Eliquis.  Notified pt of Rebecca's advise via MyChart.  Pt also stated that her PCP told her she does not need to take any supplemental vitamin D since she's taking a MV daily which contains vitamin D.  Informed pt that she should take a vitamin D supplement since she over the age of 62, received chemotherapy, and is now taking Anastrozole.  Informed pt that Dr. Burr Medico and [name], NP usually recommend their pt's to take at least 2,000 mg to 3,'000mg'$  daily.  Informed pt that this RN will check with Dr. Burr Medico or [name], NP to confirm.  Spoke with [name], NP and she recommended the pt supplement with Vitamin D 2,000 mg daily.  Sent MyChart message stating the vitamin D recommendation as well.

## 2022-06-26 ENCOUNTER — Telehealth: Payer: Self-pay

## 2022-06-26 NOTE — Telephone Encounter (Signed)
This nurse received a call from this patient stating she needs to know of MD would like her to restart taking her Anastrozole. She stopped taking it on Friday 9/1 due to muscle and joint pain.  Patient stated that she went to see her primary care physician and he completed an xray and was told that she has arthritis and he discontinued her Pravastatin and started her on Meloxicam 7.5 mg for the pain.  Patient is requesting to know if she should restart taking her Anastrozole now.  This nurse advised patient that her request will be forwarded to the MD for recommendation.  No further questions or concerns at the time.

## 2022-07-03 ENCOUNTER — Encounter (HOSPITAL_BASED_OUTPATIENT_CLINIC_OR_DEPARTMENT_OTHER): Payer: Self-pay | Admitting: Surgery

## 2022-07-03 ENCOUNTER — Other Ambulatory Visit: Payer: Self-pay

## 2022-07-03 NOTE — Progress Notes (Signed)
Per Dr. Annamaria Boots, oncologist, pt is to: "-bridge eliquis to Lovenox as instructed, she will stop eliquis after 9/14, and start Lovenox '120mg'$  daily on 9/15 for 3 days then stop. REstart Eliquis after surgery per Dr. Brantley Stage"

## 2022-07-03 NOTE — Progress Notes (Signed)
Chart reviewed with Dr. Gifford Shave, anesthesiologist. Faythe Ghee to proceed with surgery as planned barring any significant changes to pt health and as long as patient receives and follows MD instructions regarding holing Eliquis and Lovenox.

## 2022-07-05 NOTE — Anesthesia Preprocedure Evaluation (Signed)
Anesthesia Evaluation  Patient identified by MRN, date of birth, ID band Patient awake    Reviewed: Allergy & Precautions, NPO status , Patient's Chart, lab work & pertinent test results  Airway Mallampati: II  TM Distance: >3 FB Neck ROM: Full    Dental no notable dental hx.    Pulmonary PE   Pulmonary exam normal breath sounds clear to auscultation       Cardiovascular hypertension, Pt. on home beta blockers + DVT  Normal cardiovascular exam Rhythm:Regular Rate:Normal  ECHO: Left ventricular ejection fraction, by estimation, is 50 to 55%. The left ventricle has low normal function. The left ventricle has no regional wall motion abnormalities. Left ventricular diastolic parameters are indeterminate. Right ventricular systolic function is normal. The right ventricular size is normal. There's a small mobile mass on the anterior mitral valve leaflet (image 4) ddx includes calcification vs vegetation. No evidence of mitral valve regurgitation. The aortic valve is grossly normal. Aortic valve regurgitation is not visualized.  Aortic no significant ascending aortic aneurysm.   Neuro/Psych negative neurological ROS  negative psych ROS   GI/Hepatic negative GI ROS, Neg liver ROS,   Endo/Other  negative endocrine ROS  Renal/GU negative Renal ROS  negative genitourinary   Musculoskeletal negative musculoskeletal ROS (+)   Abdominal   Peds negative pediatric ROS (+)  Hematology negative hematology ROS (+) Blood dyscrasia, ,   Anesthesia Other Findings LEFT BREAST CANCER  Reproductive/Obstetrics negative OB ROS                            Anesthesia Physical Anesthesia Plan  ASA: 3  Anesthesia Plan: General   Post-op Pain Management: Regional block*   Induction: Intravenous  PONV Risk Score and Plan: 3 and Ondansetron, Dexamethasone, Midazolam and Treatment may vary due to age or medical  condition  Airway Management Planned: LMA  Additional Equipment:   Intra-op Plan:   Post-operative Plan: Extubation in OR  Informed Consent: I have reviewed the patients History and Physical, chart, labs and discussed the procedure including the risks, benefits and alternatives for the proposed anesthesia with the patient or authorized representative who has indicated his/her understanding and acceptance.     Dental advisory given  Plan Discussed with: CRNA and Surgeon  Anesthesia Plan Comments:         Anesthesia Quick Evaluation

## 2022-07-05 NOTE — Progress Notes (Signed)

## 2022-07-09 ENCOUNTER — Ambulatory Visit
Admission: RE | Admit: 2022-07-09 | Discharge: 2022-07-09 | Disposition: A | Discharge status: Home / Self Care | Payer: Medicare HMO | Source: Ambulatory Visit | Attending: Surgery | Admitting: Surgery

## 2022-07-09 ENCOUNTER — Ambulatory Visit
Admission: RE | Admit: 2022-07-09 | Discharge: 2022-07-09 | Disposition: A | Discharge status: Home / Self Care | Payer: Commercial Managed Care - HMO | Source: Ambulatory Visit | Attending: Surgery | Admitting: Surgery

## 2022-07-09 ENCOUNTER — Other Ambulatory Visit: Payer: Self-pay | Admitting: Surgery

## 2022-07-09 DIAGNOSIS — C50912 Malignant neoplasm of unspecified site of left female breast: Secondary | ICD-10-CM

## 2022-07-10 ENCOUNTER — Encounter (HOSPITAL_BASED_OUTPATIENT_CLINIC_OR_DEPARTMENT_OTHER): Payer: Self-pay | Admitting: Surgery

## 2022-07-10 ENCOUNTER — Other Ambulatory Visit: Payer: Self-pay | Admitting: Surgery

## 2022-07-10 ENCOUNTER — Ambulatory Visit
Admission: RE | Admit: 2022-07-10 | Discharge: 2022-07-10 | Disposition: A | Discharge status: Home / Self Care | Payer: Medicare HMO | Source: Ambulatory Visit | Attending: Surgery | Admitting: Surgery

## 2022-07-10 ENCOUNTER — Ambulatory Visit
Admission: RE | Admit: 2022-07-10 | Discharge: 2022-07-10 | Disposition: A | Discharge status: Home / Self Care | Payer: Commercial Managed Care - HMO | Source: Ambulatory Visit | Attending: Surgery | Admitting: Surgery

## 2022-07-10 ENCOUNTER — Ambulatory Visit (HOSPITAL_BASED_OUTPATIENT_CLINIC_OR_DEPARTMENT_OTHER)
Admission: RE | Admit: 2022-07-10 | Discharge: 2022-07-10 | Disposition: A | Discharge status: Home / Self Care | Payer: Medicare HMO | Source: Ambulatory Visit | Attending: Surgery | Admitting: Surgery

## 2022-07-10 ENCOUNTER — Ambulatory Visit (HOSPITAL_BASED_OUTPATIENT_CLINIC_OR_DEPARTMENT_OTHER): Payer: Medicare HMO | Admitting: Anesthesiology

## 2022-07-10 ENCOUNTER — Encounter (HOSPITAL_BASED_OUTPATIENT_CLINIC_OR_DEPARTMENT_OTHER)
Admission: RE | Disposition: A | Discharge status: Home / Self Care | Payer: Self-pay | Source: Ambulatory Visit | Attending: Surgery

## 2022-07-10 ENCOUNTER — Other Ambulatory Visit: Payer: Self-pay

## 2022-07-10 DIAGNOSIS — C50912 Malignant neoplasm of unspecified site of left female breast: Secondary | ICD-10-CM

## 2022-07-10 DIAGNOSIS — C773 Secondary and unspecified malignant neoplasm of axilla and upper limb lymph nodes: Secondary | ICD-10-CM | POA: Insufficient documentation

## 2022-07-10 DIAGNOSIS — C50412 Malignant neoplasm of upper-outer quadrant of left female breast: Secondary | ICD-10-CM | POA: Insufficient documentation

## 2022-07-10 DIAGNOSIS — Z01818 Encounter for other preprocedural examination: Secondary | ICD-10-CM

## 2022-07-10 DIAGNOSIS — Z170 Estrogen receptor positive status [ER+]: Secondary | ICD-10-CM | POA: Insufficient documentation

## 2022-07-10 DIAGNOSIS — Z86711 Personal history of pulmonary embolism: Secondary | ICD-10-CM | POA: Insufficient documentation

## 2022-07-10 DIAGNOSIS — Z7901 Long term (current) use of anticoagulants: Secondary | ICD-10-CM | POA: Diagnosis not present

## 2022-07-10 DIAGNOSIS — Z86718 Personal history of other venous thrombosis and embolism: Secondary | ICD-10-CM | POA: Insufficient documentation

## 2022-07-10 DIAGNOSIS — I10 Essential (primary) hypertension: Secondary | ICD-10-CM | POA: Diagnosis not present

## 2022-07-10 DIAGNOSIS — I2699 Other pulmonary embolism without acute cor pulmonale: Secondary | ICD-10-CM | POA: Diagnosis not present

## 2022-07-10 DIAGNOSIS — Z9221 Personal history of antineoplastic chemotherapy: Secondary | ICD-10-CM | POA: Insufficient documentation

## 2022-07-10 HISTORY — PX: RADIOACTIVE SEED GUIDED AXILLARY SENTINEL LYMPH NODE: SHX6735

## 2022-07-10 HISTORY — DX: Other pulmonary embolism without acute cor pulmonale: I26.99

## 2022-07-10 HISTORY — DX: Pneumonia, unspecified organism: J18.9

## 2022-07-10 HISTORY — DX: Acute embolism and thrombosis of unspecified femoral vein: I82.419

## 2022-07-10 HISTORY — PX: BREAST LUMPECTOMY WITH RADIOACTIVE SEED AND SENTINEL LYMPH NODE BIOPSY: SHX6550

## 2022-07-10 SURGERY — BREAST LUMPECTOMY WITH RADIOACTIVE SEED AND SENTINEL LYMPH NODE BIOPSY
Anesthesia: General | Site: Breast | Laterality: Left

## 2022-07-10 MED ORDER — PROPOFOL 10 MG/ML IV BOLUS
INTRAVENOUS | Status: AC
  Filled 2022-07-10: qty 20

## 2022-07-10 MED ORDER — LACTATED RINGERS IV SOLN: INTRAVENOUS | Status: DC

## 2022-07-10 MED ORDER — OXYCODONE HCL 5 MG PO TABS
5.0000 mg | ORAL_TABLET | Freq: Once | ORAL | Status: AC | PRN
  Administered 2022-07-10: 5 mg via ORAL

## 2022-07-10 MED ORDER — CEFAZOLIN SODIUM-DEXTROSE 2-4 GM/100ML-% IV SOLN
2.0000 g | INTRAVENOUS | Status: AC
  Administered 2022-07-10: 2 g via INTRAVENOUS

## 2022-07-10 MED ORDER — EPHEDRINE SULFATE (PRESSORS) 50 MG/ML IJ SOLN
INTRAMUSCULAR | Status: DC | PRN
  Administered 2022-07-10 (×2): 10 mg via INTRAVENOUS

## 2022-07-10 MED ORDER — PROPOFOL 500 MG/50ML IV EMUL
INTRAVENOUS | Status: DC | PRN
  Administered 2022-07-10: 25 ug/kg/min via INTRAVENOUS

## 2022-07-10 MED ORDER — OXYCODONE HCL 5 MG/5ML PO SOLN: 5.0000 mg | Freq: Once | ORAL | Status: AC | PRN

## 2022-07-10 MED ORDER — ROPIVACAINE HCL 5 MG/ML IJ SOLN
INTRAMUSCULAR | Status: DC | PRN
  Administered 2022-07-10: 30 mL via PERINEURAL

## 2022-07-10 MED ORDER — HYDROMORPHONE HCL 1 MG/ML IJ SOLN: 0.2500 mg | INTRAMUSCULAR | Status: DC | PRN

## 2022-07-10 MED ORDER — LIDOCAINE HCL (CARDIAC) PF 100 MG/5ML IV SOSY
PREFILLED_SYRINGE | INTRAVENOUS | Status: DC | PRN
  Administered 2022-07-10: 100 mg via INTRAVENOUS

## 2022-07-10 MED ORDER — MIDAZOLAM HCL 2 MG/2ML IJ SOLN
INTRAMUSCULAR | Status: AC
  Filled 2022-07-10: qty 2

## 2022-07-10 MED ORDER — FENTANYL CITRATE (PF) 100 MCG/2ML IJ SOLN
100.0000 ug | Freq: Once | INTRAMUSCULAR | Status: AC
  Administered 2022-07-10: 50 ug via INTRAVENOUS

## 2022-07-10 MED ORDER — MAGTRACE LYMPHATIC TRACER
INTRAMUSCULAR | Status: DC | PRN
  Administered 2022-07-10: 2 mL via INTRAMUSCULAR

## 2022-07-10 MED ORDER — LIDOCAINE 2% (20 MG/ML) 5 ML SYRINGE
INTRAMUSCULAR | Status: AC
  Filled 2022-07-10: qty 5

## 2022-07-10 MED ORDER — ONDANSETRON HCL 4 MG/2ML IJ SOLN: 4.0000 mg | Freq: Once | INTRAMUSCULAR | Status: DC | PRN

## 2022-07-10 MED ORDER — ACETAMINOPHEN 500 MG PO TABS
ORAL_TABLET | ORAL | Status: AC
  Filled 2022-07-10: qty 2

## 2022-07-10 MED ORDER — SODIUM CHLORIDE 0.9 % IV SOLN
INTRAVENOUS | Status: DC | PRN
  Administered 2022-07-10: 500 mL

## 2022-07-10 MED ORDER — SODIUM CHLORIDE 0.9 % IV SOLN
INTRAVENOUS | Status: AC
  Filled 2022-07-10: qty 10

## 2022-07-10 MED ORDER — KETOROLAC TROMETHAMINE 30 MG/ML IJ SOLN: 30.0000 mg | Freq: Once | INTRAMUSCULAR | Status: DC | PRN

## 2022-07-10 MED ORDER — FENTANYL CITRATE (PF) 100 MCG/2ML IJ SOLN
INTRAMUSCULAR | Status: DC | PRN
  Administered 2022-07-10 (×2): 50 ug via INTRAVENOUS
  Administered 2022-07-10 (×2): 25 ug via INTRAVENOUS

## 2022-07-10 MED ORDER — MIDAZOLAM HCL 2 MG/2ML IJ SOLN
2.0000 mg | Freq: Once | INTRAMUSCULAR | Status: AC
  Administered 2022-07-10: 1 mg via INTRAVENOUS

## 2022-07-10 MED ORDER — OXYCODONE HCL 5 MG PO TABS
ORAL_TABLET | ORAL | Status: AC
  Filled 2022-07-10: qty 1

## 2022-07-10 MED ORDER — OXYCODONE HCL 5 MG PO TABS: 5.0000 mg | ORAL_TABLET | Freq: Four times a day (QID) | ORAL | 0 refills | Status: DC | PRN

## 2022-07-10 MED ORDER — CHLORHEXIDINE GLUCONATE CLOTH 2 % EX PADS: 6.0000 | MEDICATED_PAD | Freq: Once | CUTANEOUS | Status: DC

## 2022-07-10 MED ORDER — BUPIVACAINE-EPINEPHRINE (PF) 0.25% -1:200000 IJ SOLN
INTRAMUSCULAR | Status: DC | PRN
  Administered 2022-07-10: 21 mL

## 2022-07-10 MED ORDER — ONDANSETRON HCL 4 MG/2ML IJ SOLN
INTRAMUSCULAR | Status: DC | PRN
  Administered 2022-07-10: 4 mg via INTRAVENOUS

## 2022-07-10 MED ORDER — CEFAZOLIN SODIUM-DEXTROSE 2-4 GM/100ML-% IV SOLN
INTRAVENOUS | Status: AC
  Filled 2022-07-10: qty 100

## 2022-07-10 MED ORDER — ACETAMINOPHEN 500 MG PO TABS
1000.0000 mg | ORAL_TABLET | ORAL | Status: AC
  Administered 2022-07-10: 1000 mg via ORAL

## 2022-07-10 MED ORDER — ONDANSETRON HCL 4 MG/2ML IJ SOLN
INTRAMUSCULAR | Status: AC
  Filled 2022-07-10: qty 2

## 2022-07-10 MED ORDER — HEMOSTATIC AGENTS (NO CHARGE) OPTIME
TOPICAL | Status: DC | PRN
  Administered 2022-07-10: 1 via TOPICAL

## 2022-07-10 MED ORDER — PHENYLEPHRINE HCL (PRESSORS) 10 MG/ML IV SOLN
INTRAVENOUS | Status: DC | PRN
  Administered 2022-07-10 (×5): 80 ug via INTRAVENOUS

## 2022-07-10 MED ORDER — FENTANYL CITRATE (PF) 100 MCG/2ML IJ SOLN
INTRAMUSCULAR | Status: AC
  Filled 2022-07-10: qty 2

## 2022-07-10 MED ORDER — BUPIVACAINE-EPINEPHRINE (PF) 0.25% -1:200000 IJ SOLN
INTRAMUSCULAR | Status: AC
  Filled 2022-07-10: qty 30

## 2022-07-10 MED ORDER — PROPOFOL 10 MG/ML IV BOLUS
INTRAVENOUS | Status: DC | PRN
  Administered 2022-07-10: 50 mg via INTRAVENOUS
  Administered 2022-07-10: 150 mg via INTRAVENOUS

## 2022-07-10 SURGICAL SUPPLY — 54 items
ADH SKN CLS APL DERMABOND .7 (GAUZE/BANDAGES/DRESSINGS) ×2
APL PRP STRL LF DISP 70% ISPRP (MISCELLANEOUS) ×2
APPLIER CLIP 9.375 MED OPEN (MISCELLANEOUS) ×4
APR CLP MED 9.3 20 MLT OPN (MISCELLANEOUS) ×4
BINDER BREAST LRG (GAUZE/BANDAGES/DRESSINGS) IMPLANT
BINDER BREAST MEDIUM (GAUZE/BANDAGES/DRESSINGS) IMPLANT
BINDER BREAST XLRG (GAUZE/BANDAGES/DRESSINGS) IMPLANT
BINDER BREAST XXLRG (GAUZE/BANDAGES/DRESSINGS) IMPLANT
BIOPATCH RED 1 DISK 7.0 (GAUZE/BANDAGES/DRESSINGS) IMPLANT
BLADE SURG 15 STRL LF DISP TIS (BLADE) ×2 IMPLANT
BLADE SURG 15 STRL SS (BLADE) ×2
CANISTER SUC SOCK COL 7IN (MISCELLANEOUS) IMPLANT
CANISTER SUCT 1200ML W/VALVE (MISCELLANEOUS) ×2 IMPLANT
CHLORAPREP W/TINT 26 (MISCELLANEOUS) ×2 IMPLANT
CLIP APPLIE 9.375 MED OPEN (MISCELLANEOUS) ×2 IMPLANT
COVER BACK TABLE 60X90IN (DRAPES) ×2 IMPLANT
COVER MAYO STAND STRL (DRAPES) ×2 IMPLANT
COVER PROBE W GEL 5X96 (DRAPES) ×2 IMPLANT
DERMABOND ADVANCED .7 DNX12 (GAUZE/BANDAGES/DRESSINGS) ×2 IMPLANT
DRAIN CHANNEL 19F RND (DRAIN) IMPLANT
DRAPE LAPAROSCOPIC ABDOMINAL (DRAPES) ×2 IMPLANT
DRAPE UTILITY XL STRL (DRAPES) ×2 IMPLANT
DRSG TEGADERM 4X4.75 (GAUZE/BANDAGES/DRESSINGS) IMPLANT
ELECT COATED BLADE 2.86 ST (ELECTRODE) ×2 IMPLANT
ELECT REM PT RETURN 9FT ADLT (ELECTROSURGICAL) ×2
ELECTRODE REM PT RTRN 9FT ADLT (ELECTROSURGICAL) ×2 IMPLANT
EVACUATOR SILICONE 100CC (DRAIN) IMPLANT
GLOVE BIOGEL PI IND STRL 8 (GLOVE) ×2 IMPLANT
GLOVE ECLIPSE 8.0 STRL XLNG CF (GLOVE) ×2 IMPLANT
GOWN STRL REUS W/ TWL LRG LVL3 (GOWN DISPOSABLE) ×4 IMPLANT
GOWN STRL REUS W/ TWL XL LVL3 (GOWN DISPOSABLE) ×2 IMPLANT
GOWN STRL REUS W/TWL LRG LVL3 (GOWN DISPOSABLE) ×4
GOWN STRL REUS W/TWL XL LVL3 (GOWN DISPOSABLE) ×2
HEMOSTAT ARISTA ABSORB 3G PWDR (HEMOSTASIS) IMPLANT
HEMOSTAT SNOW SURGICEL 2X4 (HEMOSTASIS) IMPLANT
KIT MARKER MARGIN INK (KITS) ×2 IMPLANT
NDL HYPO 25X1 1.5 SAFETY (NEEDLE) ×2 IMPLANT
NDL SAFETY ECLIP 18X1.5 (MISCELLANEOUS) IMPLANT
NEEDLE HYPO 25X1 1.5 SAFETY (NEEDLE) ×2 IMPLANT
NS IRRIG 1000ML POUR BTL (IV SOLUTION) ×2 IMPLANT
PACK BASIN DAY SURGERY FS (CUSTOM PROCEDURE TRAY) ×2 IMPLANT
PENCIL SMOKE EVACUATOR (MISCELLANEOUS) ×2 IMPLANT
SLEEVE SCD COMPRESS KNEE MED (STOCKING) ×2 IMPLANT
SPIKE FLUID TRANSFER (MISCELLANEOUS) IMPLANT
SPONGE T-LAP 4X18 ~~LOC~~+RFID (SPONGE) ×2 IMPLANT
SUT ETHILON 2 0 FS 18 (SUTURE) IMPLANT
SUT MNCRL AB 4-0 PS2 18 (SUTURE) ×2 IMPLANT
SUT VICRYL 3-0 CR8 SH (SUTURE) ×2 IMPLANT
SYR CONTROL 10ML LL (SYRINGE) ×2 IMPLANT
TOWEL GREEN STERILE FF (TOWEL DISPOSABLE) ×2 IMPLANT
TRACER MAGTRACE VIAL (MISCELLANEOUS) IMPLANT
TRAY FAXITRON CT DISP (TRAY / TRAY PROCEDURE) ×2 IMPLANT
TUBE CONNECTING 20X1/4 (TUBING) ×2 IMPLANT
YANKAUER SUCT BULB TIP NO VENT (SUCTIONS) ×2 IMPLANT

## 2022-07-10 NOTE — Anesthesia Postprocedure Evaluation (Signed)
Anesthesia Post Note  Patient: Cassandra Brennan  Procedure(s) Performed: LEFT BREAST LUMPECTOMY WITH RADIOACTIVE SEED AND LEFT  SENTINEL LYMPH NODE MAPPING (Left: Breast) LEFT AXILLARY SEED LYMPH NODE BIOPSY (Left: Axilla)     Patient location during evaluation: PACU Anesthesia Type: General Level of consciousness: awake and alert Pain management: pain level controlled Vital Signs Assessment: post-procedure vital signs reviewed and stable Respiratory status: spontaneous breathing, nonlabored ventilation, respiratory function stable and patient connected to nasal cannula oxygen Cardiovascular status: blood pressure returned to baseline and stable Postop Assessment: no apparent nausea or vomiting Anesthetic complications: no   No notable events documented.  Last Vitals:  Vitals:   07/10/22 1000 07/10/22 1016  BP: 125/72 132/89  Pulse: 78 68  Resp: 15 16  Temp:  (!) 36.3 C  SpO2: 100% 98%    Last Pain:  Vitals:   07/10/22 1014  TempSrc:   PainSc: 5                  Analyn Matusek S

## 2022-07-10 NOTE — H&P (Signed)
History of Present Illness: Cassandra Brennan is a 66 y.o. female who is seen today for long-term follow-up of her stage IIb left breast cancer. She has finished neoadjuvant chemotherapy but unfortunately developed a massive pulmonary embolus last month. She is doing better and has no chest pain or shortness of breath today. She is maintained on anticoagulation. Follow-up MRI showed a complete response to her tumor. Of note she had a 1.9 cm mass in the left breast upper outer quadrant in multiple large left axillar lymph nodes. Core biopsy 1 showed metastatic disease although this is resolved on her recent MRI..  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 B/l diagnostic MM and left [place] 10/19/21 showed: 2 cm mass at 10 o'clock; 5 mm indeterminate satellite mass at 12 o'clock; palpable left axilla mass corresponds with large lymph node. Biopsy 10/27/21 confirmed invasive ductal carcinoma with necrosis to both 10 o'clock and lymph node, grade 3. ER weakly positive (30%). -breast MRI 11/15/21 shows the biopsy proven malignancy in the upper inner left breat measuring 1.9 cm, a biopsy proven malignant 4.3 cm left axillary LN and 2 other abnormal LNs measuring 1.3 and 1.5 cm, and a 1.3 liver mass likely representing a cyst.  -CT CAP 11/23/21 shows known left breast cancer and bulky axillary adenopathy, a liver cyst, and tiny indeterminate liver nodules too small to characterize. We will monitor these in the future. No evidence of distant metastasis.  -Bone scan is negative for osseous metastasis.  -Baseline echo on 11/22/21 is normal, LVEF 55-60%. -Left breast mass in the upper inner quadrant measured 2 cm and 2 palpable lymph nodes largest measuring 4.5 cm on 2/7 the day she began neoadjuvant chemo. -She received 4 cycles of neoadjuvant AC 09/27/21 - 01/08/22, tolerated moderate well with fatigue and  nausea. Breast mass and adenopathy are no longer palpable. -she moved to weekly taxol on 01/22/22.  -She was hospitalized on 03/22/22 for massive PE and cardiogenic shock. Given likely relation of these to chemo and severity of the PE, Taxol was discontinued. -post-treatment breast MRI on 04/12/22 showed complete imaging response to treatment. I reviewed the results with her today. -plan to refer her back to Dr. Brantley Stage next month in anticipation of surgery in 06/2022, postponed due to massive PE. Because of the delay, we discussed starting antiestrogen therapy now. --Given the ER positivity, I recommend aromatase inhibitor to reduce her risk of cancer recurrence, and start her while she is waiting for surgery. The potential benefit and side effects, which includes but not limited to, hot flash, skin and vaginal dryness, metabolic changes ( increased blood glucose, cholesterol, weight, etc.), slightly in increased risk of cardiovascular disease, cataracts, muscular and joint discomfort, osteopenia and osteoporosis, etc, and management of side effects, were discussed with her in great details. She is interested, and I called in anastrozole for her to start now.  2. Massive B/L PE -s/p C9 taxol, she experienced fatigue, tachycardia, and dyspnea, admitted 03/22/22. CT showed large bilateral pulmonary emboli with right heart strain. -previously discussed this is likely related to underlying malignancy and chemotherapy. Discontinued taxol. -Status post thrombolysis -Tolerating Eliquis well, will continue for at least 6 months, possible indefinitely    Review of Systems: A complete review of systems was obtained from the patient. I have reviewed this information and discussed as appropriate with the patient. See HPI as well for other ROS.  Medical History: Past Medical History:  Diagnosis Date  Anxiety  Arthritis  GERD (gastroesophageal reflux disease)  History of cancer  Hyperlipidemia  Hypertension    Patient Active Problem List  Diagnosis  Malignant neoplasm of upper-inner quadrant of left breast in female, estrogen receptor positive (CMS-HCC)  Hypertension   Past Surgical History:  Procedure Laterality Date  LAPAROSCOPIC TUBAL LIGATION N/A  TONSILLECTOMY N/A  Date Unknown    Allergies  Allergen Reactions  Guaifenesin Hives  Other Other (See Comments)  Had a rxn to cortisone knee injection - had elevated BP and HR   Current Outpatient Medications on File Prior to Visit  Medication Sig Dispense Refill  ALPRAZolam (XANAX) 0.5 MG tablet Take by mouth  anastrozole (ARIMIDEX) 1 mg tablet  ELIQUIS 5 mg tablet  metoprolol succinate (TOPROL-XL) 100 MG XL tablet  pravastatin (PRAVACHOL) 20 MG tablet Take 20 mg by mouth once daily  fluticasone propionate (FLONASE) 50 mcg/actuation nasal spray fluticasone propionate 50 mcg/actuation nasal spray,suspension (Patient not taking: Reported on 05/14/2022)   No current facility-administered medications on file prior to visit.   Family History  Problem Relation Age of Onset  High blood pressure (Hypertension) Mother  Obesity Sister  High blood pressure (Hypertension) Sister  High blood pressure (Hypertension) Brother  Colon cancer Maternal Uncle  Diabetes Maternal Grandmother  Stroke Maternal Grandfather  Diabetes Paternal Grandmother  Diabetes Son  Diabetes Daughter    Social History   Tobacco Use  Smoking Status Never  Smokeless Tobacco Never    Social History   Socioeconomic History  Marital status: Unknown  Tobacco Use  Smoking status: Never  Smokeless tobacco: Never  Vaping Use  Vaping Use: Never used  Substance and Sexual Activity  Alcohol use: Not Currently  Drug use: Never   Objective:   There were no vitals filed for this visit.  There is no height or weight on file to calculate BMI.  Physical Exam Constitutional:  Appearance: Normal appearance.  Cardiovascular:  Rate and Rhythm: Normal rate.   Pulmonary:  Effort: Pulmonary effort is normal.  Breath sounds: No stridor.  Chest:  Breasts: Right: Normal. No swelling, inverted nipple, mass or nipple discharge.  Left: Normal. No swelling, inverted nipple, mass or nipple discharge.   Musculoskeletal:  Cervical back: Normal range of motion.  Lymphadenopathy:  Upper Body:  Right upper body: No supraclavicular or axillary adenopathy.  Left upper body: No supraclavicular or axillary adenopathy.  Neurological:  Mental Status: She is alert.  Psychiatric:  Mood and Affect: Mood normal.    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  The patient recently completed neoadjuvant chemotherapy and MRI is obtained to evaluate treatment response.  EXAM: BILATERAL BREAST MRI WITH AND WITHOUT CONTRAST  TECHNIQUE: Multiplanar, multisequence MR images of both breasts were obtained prior to and following the intravenous administration of 8 ml of Gadavist.  Three-dimensional MR images were rendered by post-processing of the original MR data on an independent workstation. The three-dimensional MR images were interpreted, and findings are reported in the following complete MRI report for this study. Three dimensional images were evaluated at the independent interpreting workstation using the DynaCAD thin client.  COMPARISON: Previous exams, including pretreatment Breast MRI 11/15/2021.  FINDINGS: Breast composition: b. Scattered fibroglandular tissue.  Background parenchymal enhancement: Mild to moderate.  RIGHT breast: No suspicious mass or abnormal enhancement.  LEFT breast: Interval complete imaging  response to neoadjuvant chemotherapy as there is no residual enhancing mass in the [place] at the site of the blooming artifact related to the biopsy clip.  No suspicious mass or abnormal enhancement elsewhere.  Lymph nodes: The biopsy-proven metastatic lymph node in the LEFT axilla has normalized in appearance after neoadjuvant chemotherapy. The 2 adjacent lymph nodes which were previously abnormal have also normalized in appearance.  No new or suspicious lymphadenopathy elsewhere.  Ancillary findings: Benign cyst in the LEFT hepatic lobe (as noted on prior CTs).  IMPRESSION: 1. Complete imaging response of the biopsy-proven malignancy in the UPPER INNER QUADRANT of the LEFT breast. 2. Complete imaging response of the biopsy-proven metastatic LEFT axillary lymph node and the 2 adjacent previously abnormal lymph nodes in the LEFT axilla as they are all now normal in appearance. 3. Benign cyst in the LEFT hepatic lobe.  RECOMMENDATION: Treatment plan.  BI-RADS CATEGORY 6: Known biopsy-proven malignancy.   Electronically Signed By: Evangeline Dakin M.D. On: 04/13/2022 11:06   Assessment and Plan:   Diagnoses and all orders for this visit:  Malignant neoplasm of upper-inner quadrant of left breast in female, estrogen receptor positive (CMS-HCC) - Ambulatory Referral to Physical Therapy    Reviewed MRI with the patient and family today. She had a complete response. She would like to proceed with left breast seed lumpectomy with targeted left axillar lymph node biopsy and left breast sentinel lymph node mapping. Her port will stay in place for now. We discussed breast conserving surgery versus mastectomy reconstruction. Discussed local regional recurrence rates, long-term complications, potential for distant metastatic disease, and the additional surgery is required for both depending on findings and/or reexcision if necessary. Discussed lymphedema and will refer  to physical therapy. Discussed lymph node mapping, seed use and expected postop recovery. Discussed complications of each. Discussed complications of bleeding, infection, reexcision of surgery, cosmetic deformity, pain, drainage, poor wound healing, cardiovascular risk, potential exacerbation of underlying medical problems and rarely death. She would like to proceed with breast conserving surgery on the left.  No follow-ups on file.  Kennieth Francois, MD   I had direct face-to-face contact with the patient for a total of 25 minutes and greater than 50% of that time was spent providing counseling and/or coordination of care for the patient regarding breast cancer surgery.

## 2022-07-10 NOTE — Anesthesia Procedure Notes (Signed)
Procedure Name: LMA Insertion Date/Time: 07/10/2022 7:30 AM  Performed by: Maryella Shivers, CRNAPre-anesthesia Checklist: Patient identified, Emergency Drugs available, Suction available and Patient being monitored Patient Re-evaluated:Patient Re-evaluated prior to induction Oxygen Delivery Method: Circle system utilized Preoxygenation: Pre-oxygenation with 100% oxygen Induction Type: IV induction Ventilation: Mask ventilation without difficulty LMA: LMA inserted LMA Size: 4.0 Number of attempts: 1 Airway Equipment and Method: Bite block Placement Confirmation: positive ETCO2 Tube secured with: Tape Dental Injury: Teeth and Oropharynx as per pre-operative assessment

## 2022-07-10 NOTE — Op Note (Signed)
Preoperative diagnosis: Stage II left breast cancer status post neoadjuvant chemotherapy upper outer quadrant  Postoperative diagnosis: Same  Procedure: Left breast seed localized lumpectomy using 2 seeds and seed localized left axillar lymph node biopsy with left axillary sent lymph node mapping using mag trace and completion left axillary lymph node dissection due to grossly abnormal lymph nodes  Surgeon: Thomas Cornett, MD  Anesthesia: General with 0.25% Marcaine with epinephrine local and left pectoral block per anesthesia  Drains: 19 round to left axilla  EBL: 10 cc  IV fluids: Per anesthesia record  Indications for procedure: The patient is a 66-year-old female who has completed neoadjuvant chemotherapy for stage II left breast cancer.  Her surgical treatment is delayed due to pulmonary embolus and she is stabilized from that.  She presents today for definitive surgery.  She desires breast conserving surgery.  She had significant response to chemotherapy and a complete response.  She also had a fibroadenoma that was felt to be discordant in the same left breast therefore a second seed was deployed.  She had 2 seeds in the breast and 1 in the axilla since she had a positive node pretreatment.  We discussed sentinel lymph node mapping.  We discussed lumpectomy.  Pros and cons of surgery as well as the need for additional surgery were discussed and we also discussed the role of axillar lymph node dissection.  We discussed potential problems lymphedema, nerve injury, blood vessel injury, bleeding postoperative due to blood thinner, cardiovascular event, pulmonary embolus, DVT, death, cardiovascular event or neurological event.  We discussed alternative therapies.  She wished to conserve her breast.The procedure has been discussed with the patient. Alternatives to surgery have been discussed with the patient.  Risks of surgery include bleeding,  Infection,  Seroma formation, death,  and the need for  further surgery.   The patient understands and wishes to proceed.       Description of procedure: The patient was met in the holding area and questions were answered.  Left breast was marked as correct site.  She had 2 seeds placed in the left breast and left axilla as an outpatient yesterday.  All questions were answered.  She underwent pectoral block.  She was then taken back to the operating room.  She is placed supine upon the OR table.  After induction of general esthesia, the nipple was prepped in sterile fashion and 2 cc of mag tracer injected and massaged.  This was for 5 minutes.  Her left breast was then prepped and draped in a sterile fashion.  A second timeout was performed.  Proper patient, site and procedure were verified again and she received appropriate preoperative antibiotics.  Of note she had a left pectoral block placed in the holding area.  Neoprobe was used to localize both seeds left breast upper outer quadrant.  A curvilinear incision was made in the left breast upper outer quadrant dissection was carried down around all tissue which included both seeds.  There appeared to be residual mass effect in the area where the tumor was.  I made sure to get wide around this.  This entire area is excised as 1 specimen of both seeds and both clips in it.  This was verified by the Faxitron.  Elected to shave the medial and lateral margins since I thought these were closed.  The deep margin was the pectoralis muscle and the anterior margin was the skin.  Specimen was oriented with ink and the Faxitron did indeed   reveal both seeds and clips to be present.  This passed off the field.  The cavity is irrigated and and clips were used to mark the cavity.  We then put local anesthetic throughout.  We closed with 3-0 Vicryl for Monocryl.  Neoprobe used to identify the seed in the left axilla.  A 4 cm incision was made in the left axilla.  The hotspot identified.  She had significant abnormal nodes in  the left axilla.  Unclear if his treatment artifact or there is residual disease but they were not normal in appearance.  I did find the targeted node with the seed and removed it.  It also was a sentinel node.  Upon further inspection though there were multiple abnormal lymph nodes in the left axilla on examination.  In light of these findings and the fact that she had neoadjuvant chemotherapy, I thought there is high risk for multiple nodes with disease.  Given the bulky adenopathy I encountered in the left axilla, I felt the node dissection will be warranted given the nature of her cancer.  Also she had neoadjuvant chemotherapy.  I did not feel a frozen section would be helpful with the multiple abnormal nodes given their appearance.  I elected to perform the node dissection at this point time given the clinical findings.  The long thoracic nerves identified and preserved.  The x-ray vein was then defined and preserved.  The thoracodorsal trunk was identified and preserved.  All tissue between his boundaries was excised.  The small vessels and lymphatics were controlled with clips.  We then passed the extra contents off the field.  Palpation and level 2 did not feel anything up under the pack or more medial Level 3.  There were no other palpable nodes that I could feel after this.  The cavity was irrigated.  It was made hemostatic with cautery.  Arista was placed.  We then placed a drain through a separate stab incision in the left axilla and secured to the skin with 2-0 nylon..  3-0 Vicryl was used to close the subcutaneous space and 4 Monocryl was used to close skin.  Dermabond applied to both incisions.  Breast binder placed.  All counts found to be correct.  The patient was awoke extubated taken to recovery in satisfactory condition. 

## 2022-07-10 NOTE — Discharge Instructions (Addendum)
[facility] Phone Number [phone]  BREAST BIOPSY/ PARTIAL MASTECTOMY: POST OP INSTRUCTIONS  Always review your discharge instruction sheet given to you by the facility where your surgery was performed.  IF YOU HAVE DISABILITY OR FAMILY LEAVE FORMS, YOU MUST BRING THEM TO THE OFFICE FOR PROCESSING.  DO NOT GIVE THEM TO YOUR DOCTOR.  A prescription for pain medication may be given to you upon discharge.  Take your pain medication as prescribed, if needed.  If narcotic pain medicine is not needed, then you may take acetaminophen (Tylenol) or ibuprofen (Advil) as needed. Take your usually prescribed medications unless otherwise directed If you need a refill on your pain medication, please contact your pharmacy.  They will contact our office to request authorization.  Prescriptions will not be filled after 5pm or on week-ends. You should eat very light the first 24 hours after surgery, such as soup, crackers, pudding, etc.  Resume your normal diet the day after surgery. Most patients will experience some swelling and bruising in the breast.  Ice packs and a good support bra will help.  Swelling and bruising can take several days to resolve.  It is common to experience some constipation if taking pain medication after surgery.  Increasing fluid intake and taking a stool softener will usually help or prevent this problem from occurring.  A mild laxative (Milk of Magnesia or Miralax) should be taken according to package directions if there are no bowel movements after 48 hours. Unless discharge instructions indicate otherwise, you may remove your bandages 24-48 hours after surgery, and you may shower at that time.  You may have steri-strips (small skin tapes) in place directly over the incision.  These strips should be left on the skin for 7-10 days.  If your surgeon used skin glue on the incision, you may shower in 24 hours.  The glue will flake off over the next 2-3 weeks.  Any  sutures or staples will be removed at the office during your follow-up visit. ACTIVITIES:  You may resume regular daily activities (gradually increasing) beginning the next day.  Wearing a good support bra or sports bra minimizes pain and swelling.  You may have sexual intercourse when it is comfortable. You may drive when you no longer are taking prescription pain medication, you can comfortably wear a seatbelt, and you can safely maneuver your car and apply brakes. RETURN TO WORK:  ______________________________________________________________________________________ Dennis Bast should see your doctor in the office for a follow-up appointment approximately two weeks after your surgery.  Your doctor's nurse will typically make your follow-up appointment when she calls you with your pathology report.  Expect your pathology report 2-3 business days after your surgery.  You may call to check if you do not hear from [place] after three days. OTHER INSTRUCTIONS: _______________________________________________________________________________________________ _____________________________________________________________________________________________________________________________________ _____________________________________________________________________________________________________________________________________ _____________________________________________________________________________________________________________________________________  WHEN TO CALL YOUR DOCTOR: Fever over 101.0 Nausea and/or vomiting. Extreme swelling or bruising. Continued bleeding from incision. Increased pain, redness, or drainage from the incision.  The clinic staff is available to answer your questions during regular business hours.  Please don't hesitate to call and ask to speak to one of the nurses for clinical concerns.  If you have a medical emergency, go to the nearest emergency room or call 911.  A surgeon from [facility] is always on call at the hospital.  For further questions, please visit [email or web address]    No Tylenol until after 12:30pm today if needed  Post Anesthesia Home Care Instructions  Activity: Get plenty of rest for the remainder of the day. A responsible individual must stay with you for 24 hours following the procedure.  For the next 24 hours, DO NOT: -Drive a car -Paediatric nurse -Drink alcoholic beverages -Take any medication unless instructed by your physician -Make any legal decisions or sign important papers.  Meals: Start with liquid foods such as gelatin or soup. Progress to regular foods as tolerated. Avoid greasy, spicy, heavy foods. If nausea and/or vomiting occur, drink only clear liquids until the nausea and/or vomiting subsides. Call your physician if vomiting continues.  Special Instructions/Symptoms: Your throat may feel dry or sore from the anesthesia or the breathing tube placed in your throat during surgery. If this causes discomfort, gargle with warm salt water. The discomfort should disappear within 24 hours.  If you had a scopolamine patch placed behind your ear for the management of post- operative nausea and/or vomiting:  1. The medication in the patch is effective for 72 hours, after which it should be removed.  Wrap patch in a tissue and discard in the trash. Wash hands thoroughly with soap and water. 2. You may remove the patch earlier than 72 hours if you experience unpleasant side effects which may include dry mouth, dizziness or visual disturbances. 3. Avoid touching the patch. Wash your hands with soap and water after contact with the patch.

## 2022-07-10 NOTE — Anesthesia Procedure Notes (Signed)
Anesthesia Procedure Image    

## 2022-07-10 NOTE — Transfer of Care (Signed)
Immediate Anesthesia Transfer of Care Note  Patient: Cassandra Brennan  Procedure(s) Performed: LEFT BREAST LUMPECTOMY WITH RADIOACTIVE SEED AND LEFT  SENTINEL LYMPH NODE MAPPING (Left: Breast) LEFT AXILLARY SEED LYMPH NODE BIOPSY (Left: Axilla)  Patient Location: PACU  Anesthesia Type:GA combined with regional for post-op pain  Level of Consciousness: sedated  Airway & Oxygen Therapy: Patient Spontanous Breathing and Patient connected to face mask oxygen  Post-op Assessment: Report given to RN and Post -op Vital signs reviewed and stable  Post vital signs: Reviewed and stable  Last Vitals:  Vitals Value Taken Time  BP 122/75 07/10/22 0925  Temp    Pulse 77 07/10/22 0926  Resp 17 07/10/22 0926  SpO2 100 % 07/10/22 0926  Vitals shown include unvalidated device data.  Last Pain:  Vitals:   07/10/22 0626  TempSrc: Oral  PainSc: 0-No pain      Patients Stated Pain Goal: 3 (93/26/71 2458)  Complications: No notable events documented.

## 2022-07-10 NOTE — Anesthesia Procedure Notes (Signed)
Anesthesia Regional Block: Pectoralis block   Pre-Anesthetic Checklist: , timeout performed,  Correct Patient, Correct Site, Correct Laterality,  Correct Procedure, Correct Position, site marked,  Risks and benefits discussed,  Surgical consent,  Pre-op evaluation,  At surgeon's request and post-op pain management  Laterality: Left  Prep: chloraprep       Needles:  Injection technique: Single-shot  Needle Type: Echogenic Needle     Needle Length: 9cm      Additional Needles:   Procedures:,,,, ultrasound used (permanent image in chart),,    Narrative:  Start time: 07/10/2022 6:56 AM End time: 07/10/2022 7:06 AM Injection made incrementally with aspirations every 5 mL.  Performed by: Personally  Anesthesiologist: Myrtie Soman, MD  Additional Notes: Patient tolerated the procedure well without complications

## 2022-07-10 NOTE — Interval H&P Note (Signed)
History and Physical Interval Note:  07/10/2022 7:22 AM  Cassandra Brennan  has presented today for surgery, with the diagnosis of LEFT BREAST CANCER.  The various methods of treatment have been discussed with the patient and family. After consideration of risks, benefits and other options for treatment, the patient has consented to  Procedure(s): LEFT BREAST LUMPECTOMY WITH RADIOACTIVE SEED AND LEFT  SENTINEL LYMPH NODE MAPPING (Left) LEFT AXILLARY SEED LYMPH NODE BIOPSY (Left) as a surgical intervention.  The patient's history has been reviewed, patient examined, no change in status, stable for surgery.  I have reviewed the patient's chart and labs.  Questions were answered to the patient's satisfaction.     Turner Daniels MD

## 2022-07-10 NOTE — Progress Notes (Signed)
Assisted Dr. Rose with left, pectoralis, ultrasound guided block. Side rails up, monitors on throughout procedure. See vital signs in flow sheet. Tolerated Procedure well. 

## 2022-07-11 ENCOUNTER — Encounter (HOSPITAL_BASED_OUTPATIENT_CLINIC_OR_DEPARTMENT_OTHER): Payer: Self-pay | Admitting: Surgery

## 2022-07-16 ENCOUNTER — Other Ambulatory Visit: Payer: Self-pay | Admitting: Hematology

## 2022-07-17 ENCOUNTER — Encounter: Payer: Self-pay | Admitting: Surgery

## 2022-07-17 ENCOUNTER — Encounter: Payer: Self-pay | Admitting: *Deleted

## 2022-07-19 LAB — SURGICAL PATHOLOGY

## 2022-07-20 ENCOUNTER — Encounter: Payer: Self-pay | Admitting: Surgery

## 2022-07-23 ENCOUNTER — Inpatient Hospital Stay: Payer: Medicare HMO

## 2022-07-23 ENCOUNTER — Telehealth: Payer: Self-pay | Admitting: Pharmacy Technician

## 2022-07-23 ENCOUNTER — Other Ambulatory Visit (HOSPITAL_COMMUNITY): Payer: Self-pay

## 2022-07-23 ENCOUNTER — Inpatient Hospital Stay: Payer: Medicare HMO | Admitting: Hematology

## 2022-07-23 ENCOUNTER — Other Ambulatory Visit: Payer: Self-pay

## 2022-07-23 ENCOUNTER — Telehealth: Payer: Self-pay | Admitting: Pharmacist

## 2022-07-23 ENCOUNTER — Telehealth: Payer: Self-pay | Admitting: *Deleted

## 2022-07-23 ENCOUNTER — Encounter: Payer: Self-pay | Admitting: Hematology

## 2022-07-23 VITALS — BP 151/81 | HR 89 | Temp 98.4°F | Resp 16 | Ht 65.0 in | Wt 175.7 lb

## 2022-07-23 DIAGNOSIS — Z170 Estrogen receptor positive status [ER+]: Secondary | ICD-10-CM | POA: Insufficient documentation

## 2022-07-23 DIAGNOSIS — Z86711 Personal history of pulmonary embolism: Secondary | ICD-10-CM | POA: Insufficient documentation

## 2022-07-23 DIAGNOSIS — Z882 Allergy status to sulfonamides status: Secondary | ICD-10-CM | POA: Insufficient documentation

## 2022-07-23 DIAGNOSIS — Z7901 Long term (current) use of anticoagulants: Secondary | ICD-10-CM | POA: Insufficient documentation

## 2022-07-23 DIAGNOSIS — Z86718 Personal history of other venous thrombosis and embolism: Secondary | ICD-10-CM | POA: Insufficient documentation

## 2022-07-23 DIAGNOSIS — I2699 Other pulmonary embolism without acute cor pulmonale: Secondary | ICD-10-CM | POA: Insufficient documentation

## 2022-07-23 DIAGNOSIS — C50212 Malignant neoplasm of upper-inner quadrant of left female breast: Secondary | ICD-10-CM | POA: Insufficient documentation

## 2022-07-23 DIAGNOSIS — Z510 Encounter for antineoplastic radiation therapy: Secondary | ICD-10-CM | POA: Insufficient documentation

## 2022-07-23 DIAGNOSIS — Z79899 Other long term (current) drug therapy: Secondary | ICD-10-CM | POA: Insufficient documentation

## 2022-07-23 DIAGNOSIS — F419 Anxiety disorder, unspecified: Secondary | ICD-10-CM | POA: Insufficient documentation

## 2022-07-23 DIAGNOSIS — Z79811 Long term (current) use of aromatase inhibitors: Secondary | ICD-10-CM | POA: Insufficient documentation

## 2022-07-23 DIAGNOSIS — Z888 Allergy status to other drugs, medicaments and biological substances status: Secondary | ICD-10-CM | POA: Insufficient documentation

## 2022-07-23 DIAGNOSIS — I10 Essential (primary) hypertension: Secondary | ICD-10-CM | POA: Insufficient documentation

## 2022-07-23 DIAGNOSIS — M25552 Pain in left hip: Secondary | ICD-10-CM | POA: Insufficient documentation

## 2022-07-23 LAB — CMP (CANCER CENTER ONLY)
ALT: 28 U/L (ref 0–44)
AST: 16 U/L (ref 15–41)
Albumin: 4.1 g/dL (ref 3.5–5.0)
Alkaline Phosphatase: 97 U/L (ref 38–126)
Anion gap: 5 (ref 5–15)
BUN: 12 mg/dL (ref 8–23)
CO2: 31 mmol/L (ref 22–32)
Calcium: 9.4 mg/dL (ref 8.9–10.3)
Chloride: 107 mmol/L (ref 98–111)
Creatinine: 0.71 mg/dL (ref 0.44–1.00)
GFR, Estimated: >60 mL/min (ref >60)
Glucose, Bld: 103 mg/dL — ABNORMAL HIGH (ref 70–99)
Potassium: 4 mmol/L (ref 3.5–5.1)
Sodium: 143 mmol/L (ref 135–145)
Total Bilirubin: 0.4 mg/dL (ref 0.3–1.2)
Total Protein: 6.7 g/dL (ref 6.5–8.1)

## 2022-07-23 LAB — CBC WITH DIFFERENTIAL (CANCER CENTER ONLY)
Abs Immature Granulocytes: 0.01 10*3/uL (ref 0.00–0.07)
Basophils Absolute: 0 10*3/uL (ref 0.0–0.1)
Basophils Relative: 0 %
Eosinophils Absolute: 0.3 10*3/uL (ref 0.0–0.5)
Eosinophils Relative: 3 %
HCT: 32.8 % — ABNORMAL LOW (ref 36.0–46.0)
Hemoglobin: 11.3 g/dL — ABNORMAL LOW (ref 12.0–15.0)
Immature Granulocytes: 0 %
Lymphocytes Relative: 16 %
Lymphs Abs: 1.2 10*3/uL (ref 0.7–4.0)
MCH: 29.2 pg (ref 26.0–34.0)
MCHC: 34.5 g/dL (ref 30.0–36.0)
MCV: 84.8 fL (ref 80.0–100.0)
Monocytes Absolute: 0.3 10*3/uL (ref 0.1–1.0)
Monocytes Relative: 4 %
Neutro Abs: 5.5 10*3/uL (ref 1.7–7.7)
Neutrophils Relative %: 77 %
Platelet Count: 288 10*3/uL (ref 150–400)
RBC: 3.87 MIL/uL (ref 3.87–5.11)
RDW: 14.7 % (ref 11.5–15.5)
WBC Count: 7.3 10*3/uL (ref 4.0–10.5)
nRBC: 0 % (ref 0.0–0.2)

## 2022-07-23 LAB — TYPE AND SCREEN
ABO/RH(D): B POS
Antibody Screen: NEGATIVE

## 2022-07-23 MED ORDER — CAPECITABINE 500 MG PO TABS
825.0000 mg/m2 | ORAL_TABLET | Freq: Two times a day (BID) | ORAL | 1 refills | Status: DC
  Filled 2022-07-23: qty 90, 15d supply, fill #0

## 2022-07-23 NOTE — Telephone Encounter (Signed)
Oral Oncology Patient Advocate Encounter   Received notification that prior authorization for Capecitabine is required.   PA submitted on 07/23/2022 Key [id number] Status is pending     Lady Deutscher, CPhT-Adv Oncology Pharmacy Patient [place] Direct Number: [phone]  Fax: [phone]

## 2022-07-23 NOTE — Progress Notes (Signed)
[place]   Telephone:([phone] [phone]   Clinic Follow up Note   Patient Care Team: Everardo Beals, NP as PCP - General Erroll Luna, MD as Consulting Physician (General Surgery) Truitt Merle, MD as Consulting Physician (Hematology) Gery Pray, MD as Consulting Physician (Radiation Oncology) [name], RN as Oncology Nurse Navigator [name], RN as Oncology Nurse Navigator  Date of Service:  07/23/2022  CHIEF COMPLAINT: f/u of left breast cancer  CURRENT THERAPY:  Pending concurrent chemoRT with Xeloda  ASSESSMENT & PLAN:  Cassandra Brennan is a 66 y.o. post-menopausal female with   1. Malignant neoplasm of upper-inner quadrant of left breast, Stage IIB, c(T1c, N1), yp(T0, N1) ER 30% weak+, PR-/HER2-, Grade 3 -presented with palpable left breast and axillary masses. Biopsy 10/27/21 confirmed invasive ductal carcinoma with necrosis to both 10 o'clock and lymph node, grade 3. ER weakly positive (30%). -breast MRI 11/15/21 shows the biopsy proven malignancy in the upper inner left breat measuring 1.9 cm, a biopsy proven malignant 4.3 cm left axillary LN and 2 other abnormal LNs measuring 1.3 and 1.5 cm, and a 1.3 liver mass likely representing a cyst.  -CT CAP 11/23/21 shows known left breast cancer and bulky axillary adenopathy, a liver cyst, and tiny indeterminate liver nodules too small to characterize. We will monitor these in the future. No evidence of distant metastasis.  -Bone scan is negative for osseous metastasis.  -Baseline echo on 11/22/21 is normal, LVEF 55-60%. -s/p 4 cycles of neoadjuvant AC 09/27/21 - 01/08/22, tolerated moderate well with fatigue and nausea. Breast mass and adenopathy no longer palpable. -she received weekly taxol 01/22/22 - 03/20/22, discontinued due to hospitalization for massive PE and cardiogenic shock.  -she took anastrozole 04/17/22 - 06/22/22 due to delay in surgery from hospitalization, discontinued due to  arthralgias. -s/p left lumpectomy on 07/10/22 under Dr. Brantley Stage, path showed complete pathologic response in breast but 1/4 positive lymph nodes. Repeat prognostic panel on the positive node showed ER 20% weak, PR 2% moderate, and Her2 negative. -I reviewed the pathology results with her today. Given her weakly positive low percentage ER expression in her positive lymph node, I recommend adjuvant Xeloda for 6 months be start with concurrent radiation. I will call in for her to [name] today  -Potential side effect discussed with her, especially fatigue, skin toxicity, diarrhea, risk of infection etc.  She voiced good understanding and agrees to proceed. -due to arthralgia from anastrozole, I recommend switching to tamoxifen or exemestane. Plan to switch after she finishes radiation.   2. Massive B/L PE -s/p C9 taxol, she experienced fatigue, tachycardia, and dyspnea, admitted 03/22/22. CT showed large bilateral pulmonary emboli with right heart strain. -previously discussed this is likely related to underlying malignancy and chemotherapy. Discontinued taxol. -Status post thrombolysis -Tolerating Eliquis well, will continue for at least 6 months, possible indefinitely   3. Hypertension and anxiety -Continue medications and f/u with PCP     PLAN: -I will call in Xeloda, she will take 1500 mg twice daily with concurrent radiation, Monday through Friday. -lab and f/u on 10/23 (I anticipate she will start RT on 10/23)  -f/u and CT sim with rad onc 10/16   No problem-specific Assessment & Plan notes found for this encounter.   SUMMARY OF ONCOLOGIC HISTORY: Oncology History Overview Note   Cancer Staging  Malignant neoplasm of upper-inner quadrant of left breast in female, estrogen receptor positive ([name]) Staging form: Breast, AJCC 8th Edition - Clinical  stage from 10/27/2021: Stage IIB (cT1c, cN1, cM0, G3, ER+, PR-, HER2-) - Signed by [name], NP on 11/28/2021 - Pathologic stage from  07/10/2022: No Stage Recommended (ypT0, pN1, cM0, G3, ER+, PR-, HER2-) - Unsigned     Malignant neoplasm of upper-inner quadrant of left breast in female, estrogen receptor positive ([name])  10/19/2021 Mammogram   CLINICAL DATA:  Patient presents for palpable left axillary abnormality.   EXAM: DIGITAL DIAGNOSTIC BILATERAL MAMMOGRAM WITH TOMOSYNTHESIS AND CAD; ULTRASOUND LEFT BREAST LIMITED  IMPRESSION: Suspicious left breast mass 10 o'clock position.   Adjacent suspicious satellite nodule left breast 12 o'clock position.   Palpable mass left axilla corresponds with a large lymph node.   10/27/2021 Initial Biopsy   Diagnosis 1. Breast, left, needle core biopsy, 12 o'clock, ribbon - FIBROADENOMA - NO MALIGNANCY IDENTIFIED 2. Breast, left, needle core biopsy, 10 o'clock, coil - INVASIVE DUCTAL CARCINOMA WITH NECROSIS - DUCTAL CARCINOMA IN SITU - SEE COMMENT 3. Lymph node, needle/core biopsy, left axilla, tribell - INVASIVE DUCTAL CARCINOMA WITH NECROSIS - NO NODAL TISSUE IDENTIFIED - SEE COMMENT Microscopic Comment 2. and 2. Based on the biopsy, the carcinoma appears Nottingham grade 3 of 3 and measures 0.8 cm in greatest linear extent.  3. PROGNOSTIC INDICATORS Results: The tumor cells are NEGATIVE for Her2 (1+). Estrogen Receptor: 30%, POSITIVE, WEAK STAINING INTENSITY Progesterone Receptor: 0%, NEGATIVE Proliferation Marker Ki67: 60%   10/27/2021 Cancer Staging   Staging form: Breast, AJCC 8th Edition - Clinical stage from 10/27/2021: Stage IIB (cT1c, cN1, cM0, G3, ER+, PR-, HER2-) - Signed by [name], NP on 11/28/2021 Stage prefix: Initial diagnosis Histologic grading system: 3 grade system   11/06/2021 Initial Diagnosis   Malignant neoplasm of upper-inner quadrant of left breast in female, estrogen receptor positive ([place])   11/15/2021 Breast MRI   IMPRESSION: 1.9 cm mass in the upper-inner quadrant of the left breast corresponding with the known invasive ductal  carcinoma. Enlarged left axillary lymph node corresponding with known metastatic disease.   RECOMMENDATION: Treatment planning of the known left breast cancer and axillary metastasis is recommended.   Additional imaging evaluation of the mass in the liver is recommended with CT with liver mass protocol or MRI.   11/22/2021 Imaging   Bone scan IMPRESSION: No scintigraphic evidence of bony metastatic disease. Degenerative changes as above.   11/22/2021 Echocardiogram   Baseline echo LVEF 55-60%, normal GLS -21.7%   11/23/2021 Imaging   CT CAP IMPRESSION: 1. Bulky lymphadenopathy in the left axilla including a 5.0 x 2.8 cm lymph node. 2. 15 mm nodule identified in the upper inner quadrant of the left breast. 3. No suspicious pulmonary nodule or mass. No evidence for metastatic disease in the abdomen or pelvis. 4. 11 mm cyst in the dome of the left liver. Adjacent tiny hypoattenuating lesions in the right liver are too small to characterize, but likely benign. Attention on follow-up recommended. 5. Prominent stool volume raises the question of clinical constipation.   11/28/2021 -  Chemotherapy   Patient is on Treatment Plan : BREAST ADJUVANT DOSE DENSE AC q14d / PACLitaxel q7d      Genetic Testing   [facility] CancerNext was Negative. Report date is 11/26/2021.   The CancerNext gene panel offered by [employer] includes sequencing, rearrangement analysis, and RNA analysis for the following 36 genes:   APC, ATM, AXIN2, BARD1, BMPR1A, BRCA1, BRCA2, BRIP1, CDH1, CDK4, CDKN2A, CHEK2, DICER1, HOXB13, EPCAM, GREM1, MLH1, MSH2, MSH3, MSH6, MUTYH, NBN, NF1, NTHL1, PALB2, PMS2,  POLD1, POLE, PTEN, RAD51C, RAD51D, RECQL, SMAD4, SMARCA4, STK11, and TP53.    07/10/2022 Definitive Surgery   FINAL MICROSCOPIC DIAGNOSIS:   A. BREAST, LEFT, LUMPECTOMY:  - No residual carcinoma identified  - Treatment effect present (see comment)  - Ductal carcinoma in situ present, ypTis  - Margins negative for ductal carcinoma  in situ  - See oncology table   B. BREAST, LEFT ADDITIONAL MEDIAL MARGIN, EXCISION:  - Negative for carcinoma   C. BREAST, LEFT ADDITIONAL LATERAL MARGIN, EXCISION:  - Negative for carcinoma   D. LYMPH NODE, LEFT AXILLARY, SENTINEL, EXCISION:  - Positive for carcinoma (1/1)   E. LYMPH NODE, LEFT AXILLARY, SENTINEL, EXCISION:  - Negative for carcinoma (0/1)   F. LYMPH NODE, LEFT, AXILLARY CONTENTS:  - Two lymph nodes identified  - Negative for carcinoma (0/2)    ADDENDUM:  Part D: Left axillary sentinel lymph node,   PROGNOSTIC INDICATOR RESULTS:  The tumor cells are NEGATIVE for Her2 (0).  Estrogen Receptor:       POSITIVE, 20%, WEAK STAINING  Progesterone Receptor:   POSITIVE, 2%, MODERATE STAINING       INTERVAL HISTORY:  Cassandra Brennan is here for a follow up of breast cancer. She was last seen by me on 05/28/22. She presents to the clinic alone. She reports she has recovered well from surgery.  She notes she is having left hip pain and is scheduled to receive a cortisone shot for pain relief.   All other systems were reviewed with the patient and are negative.  MEDICAL HISTORY:  Past Medical History:  Diagnosis Date   Acute dyspnea    Acute metabolic encephalopathy    Anxiety    Arthritis    right knee    Elevated troponin    Femoral DVT (deep venous thrombosis) (HCC)    GERD (gastroesophageal reflux disease)    Hyperlipidemia    Hypertension    Lactic acidosis    Obstructive cardiovascular shock ([place]) 03/22/2022   Pneumonia    Positive colorectal cancer screening using Cologuard test    Pulmonary embolism ([place])    03/22/22    SURGICAL HISTORY: Past Surgical History:  Procedure Laterality Date   BREAST CYST EXCISION Right    pt stated in [place] Left 07/10/2022   Procedure: LEFT BREAST LUMPECTOMY WITH RADIOACTIVE SEED AND LEFT  SENTINEL LYMPH NODE MAPPING;  Surgeon: Erroll Luna,  MD;  Location: [place];  Service: General;  Laterality: Left;   BREAST SURGERY     other GI testing     unsure but had to drink a chalky drink    PORTACATH PLACEMENT N/A 11/27/2021   Procedure: INSERTION PORT-A-CATH;  Surgeon: Erroll Luna, MD;  Location: [facility];  Service: General;  Laterality: N/A;   RADIOACTIVE SEED GUIDED AXILLARY SENTINEL LYMPH NODE Left 07/10/2022   Procedure: LEFT AXILLARY SEED LYMPH NODE BIOPSY;  Surgeon: Erroll Luna, MD;  Location: [facility];  Service: General;  Laterality: Left;   TONSILLECTOMY     TUBAL LIGATION      I have reviewed the social history and family history with the patient and they are unchanged from previous note.  ALLERGIES:  is allergic to prednisone, rosuvastatin, other, robitussin (alcohol free) [guaifenesin], and sulfa antibiotics.  MEDICATIONS:  Current Outpatient Medications  Medication Sig Dispense Refill   capecitabine (XELODA) 500 MG tablet Take 3 tablets (1,500 mg total) by mouth 2 (  two) times daily after a meal. Take on days of radiation, Monday through Friday 90 tablet 1   acetaminophen (TYLENOL) 500 MG tablet Take 500 mg by mouth every 8 (eight) hours as needed for moderate pain.     ALPRAZolam (XANAX) 0.25 MG tablet Take 1 tablet (0.25 mg total) by mouth daily as needed for anxiety. 30 tablet 0   anastrozole (ARIMIDEX) 1 MG tablet Take 1 tablet (1 mg total) by mouth daily. 30 tablet 2   ELIQUIS 5 MG TABS tablet TAKE 1 TABLET(5 MG) BY MOUTH TWICE DAILY 60 tablet 2   lidocaine-prilocaine (EMLA) cream Apply to affected area once 30 g 3   metoprolol succinate (TOPROL-XL) 100 MG 24 hr tablet Take 100 mg by mouth daily. Take with or immediately following a meal.     Multiple Vitamins-Minerals (MULTIVITAMIN ADULTS) TABS Take 1 tablet by mouth daily.     nitrofurantoin, macrocrystal-monohydrate, (MACROBID) 100 MG capsule Take 1 capsule (100 mg total) by mouth 2 (two) times daily. 10 capsule 0    ondansetron (ZOFRAN) 8 MG tablet Take 1 tablet (8 mg total) by mouth 2 (two) times daily as needed. Start on the third day after chemotherapy. (Patient taking differently: Take 8 mg by mouth 2 (two) times daily as needed for nausea or vomiting. Start on the third day after chemotherapy.) 30 tablet 1   oxyCODONE (OXY IR/ROXICODONE) 5 MG immediate release tablet Take 1 tablet (5 mg total) by mouth every 6 (six) hours as needed for severe pain. 15 tablet 0   phenazopyridine (PYRIDIUM) 200 MG tablet Take 1 tablet (200 mg total) by mouth 3 (three) times daily. 6 tablet 0   pravastatin (PRAVACHOL) 20 MG tablet Take 1 tablet (20 mg total) by mouth daily. 30 tablet 0   prochlorperazine (COMPAZINE) 10 MG tablet Take 1 tablet (10 mg total) by mouth every 6 (six) hours as needed (Nausea or vomiting). 30 tablet 1   No current facility-administered medications for this visit.    PHYSICAL EXAMINATION: ECOG PERFORMANCE STATUS: 0 - Asymptomatic  Vitals:   07/23/22 1117  BP: (!) 151/81  Pulse: 89  Resp: 16  Temp: 98.4 F (36.9 C)  SpO2: 100%   Wt Readings from Last 3 Encounters:  07/23/22 175 lb 11.2 oz (79.7 kg)  07/10/22 173 lb 4.5 oz (78.6 kg)  05/28/22 174 lb 12.8 oz (79.3 kg)     GENERAL:alert, no distress and comfortable SKIN: skin color normal, no rashes or significant lesions EYES: normal, Conjunctiva are pink and non-injected, sclera clear  NEURO: alert & oriented x 3 with fluent speech  LABORATORY DATA:  I have reviewed the data as listed    Latest Ref Rng & Units 07/23/2022   10:00 AM 05/28/2022    9:42 AM 04/17/2022    8:07 AM  CBC  WBC 4.0 - 10.5 K/uL 7.3  5.4  5.6   Hemoglobin 12.0 - 15.0 g/dL 11.3  11.5  10.2   Hematocrit 36.0 - 46.0 % 32.8  33.3  30.3   Platelets 150 - 400 K/uL 288  229  228         Latest Ref Rng & Units 07/23/2022   10:00 AM 05/28/2022    9:42 AM 04/17/2022    8:07 AM  CMP  Glucose 70 - 99 mg/dL 103  100  114   BUN 8 - 23 mg/dL '12  15  13   ' Creatinine  0.44 - 1.00 mg/dL 0.71  0.79  0.71  Sodium 135 - 145 mmol/L 143  141  141   Potassium 3.5 - 5.1 mmol/L 4.0  3.9  3.6   Chloride 98 - 111 mmol/L 107  106  109   CO2 22 - 32 mmol/L '31  31  25   ' Calcium 8.9 - 10.3 mg/dL 9.4  9.6  9.5   Total Protein 6.5 - 8.1 g/dL 6.7  7.6  6.9   Total Bilirubin 0.3 - 1.2 mg/dL 0.4  0.5  0.4   Alkaline Phos 38 - 126 U/L 97  104  92   AST 15 - 41 U/L '16  17  18   ' ALT 0 - 44 U/L '28  29  24       ' RADIOGRAPHIC STUDIES: I have personally reviewed the radiological images as listed and agreed with the findings in the report. No results found.    No orders of the defined types were placed in this encounter.  All questions were answered. The patient knows to call the clinic with any problems, questions or concerns. No barriers to learning was detected. The total time spent in the appointment was 40 minutes.     Truitt Merle, MD 07/23/2022   I, Wilburn Mylar, am acting as scribe for Truitt Merle, MD.   I have reviewed the above documentation for accuracy and completeness, and I agree with the above.

## 2022-07-23 NOTE — Telephone Encounter (Signed)
Oral Oncology Pharmacist Encounter  Received new prescription for Xeloda (capecitabine) for the treatment of stage IIB breast cancer, ER/PR positive, HER-2 negative in conjunction with radiation, planned for duration of radiation therapy.  CBC w/ Diff and CMP from 07/23/22 assessed, no relevant lab abnormalities requiring baseline dose adjustment required at this time. Prescription dose and frequency assessed for appropriateness.   Current medication list in Epic reviewed, DDIs with Xeloda identified: Category C DDI between Xeloda and Ondansetron due to risk of Qtc prolongation with fluorouracil products. Noted patient only taking PRN and PO route, risk higher with IV administration. No change in therapy warranted at this time.   Evaluated chart and no patient barriers to medication adherence noted.   Patient agreement for treatment documented in MD note on 07/23/22.  Prescription has been e-scribed to the [facility] for benefits analysis and approval.  Oral Oncology Clinic will continue to follow for insurance authorization, copayment issues, initial counseling and start date.  Leron Croak, PharmD, BCPS, BCOP Hematology/Oncology Clinical Pharmacist Cassandra Brennan and [place] [phone] 07/23/2022 12:50 PM

## 2022-07-23 NOTE — Telephone Encounter (Signed)
Requested pathology to repeat prognostic panel on LN per Dr. Burr Medico request.

## 2022-07-24 ENCOUNTER — Other Ambulatory Visit (HOSPITAL_COMMUNITY): Payer: Self-pay

## 2022-07-24 ENCOUNTER — Telehealth: Payer: Self-pay | Admitting: Hematology

## 2022-07-24 NOTE — Telephone Encounter (Signed)
Spoke with patient confirming 10/13 appointment. Patient needed appointment moved to 10/23 because of another appointment on 10/13.

## 2022-07-24 NOTE — Telephone Encounter (Signed)
Oral Oncology Patient Advocate Encounter  Prior Authorization for Capecitabine has been approved.    PA# [id number] Effective dates: 07/23/2022 through 10/22/2023  Patients co-pay is $13.50.    Lady Deutscher, CPhT-Adv Oncology Pharmacy Patient [place] Direct Number: [phone]  Fax: [phone]

## 2022-07-25 ENCOUNTER — Other Ambulatory Visit: Payer: Self-pay

## 2022-07-31 ENCOUNTER — Encounter (HOSPITAL_COMMUNITY): Payer: Self-pay

## 2022-08-02 ENCOUNTER — Encounter: Payer: Self-pay | Admitting: Physical Therapy

## 2022-08-02 ENCOUNTER — Other Ambulatory Visit: Payer: Self-pay | Admitting: Nurse Practitioner

## 2022-08-02 ENCOUNTER — Ambulatory Visit: Payer: Medicare HMO | Attending: Surgery | Admitting: Physical Therapy

## 2022-08-02 DIAGNOSIS — R293 Abnormal posture: Secondary | ICD-10-CM | POA: Insufficient documentation

## 2022-08-02 DIAGNOSIS — M25612 Stiffness of left shoulder, not elsewhere classified: Secondary | ICD-10-CM | POA: Insufficient documentation

## 2022-08-02 DIAGNOSIS — C50212 Malignant neoplasm of upper-inner quadrant of left female breast: Secondary | ICD-10-CM | POA: Insufficient documentation

## 2022-08-02 DIAGNOSIS — Z780 Asymptomatic menopausal state: Secondary | ICD-10-CM

## 2022-08-02 DIAGNOSIS — Z483 Aftercare following surgery for neoplasm: Secondary | ICD-10-CM | POA: Insufficient documentation

## 2022-08-02 DIAGNOSIS — Z170 Estrogen receptor positive status [ER+]: Secondary | ICD-10-CM | POA: Diagnosis not present

## 2022-08-02 NOTE — Therapy (Signed)
OUTPATIENT PHYSICAL THERAPY BREAST CANCER POST OP FOLLOW UP   Patient Name: Cassandra Brennan MRN: [id number] DOB:[birth year], 66 y.o., female Today's Date: 08/02/2022   PT End of Session - 08/02/22 1006     Visit Number 2    Number of Visits 10    Date for PT Re-Evaluation 08/30/22    PT Start Time 20    PT Stop Time 5102    PT Time Calculation (min) 50 min    Activity Tolerance Patient tolerated treatment well    Behavior During Therapy WFL for tasks assessed/performed             Past Medical History:  Diagnosis Date   Acute dyspnea    Acute metabolic encephalopathy    Anxiety    Arthritis    right knee    Elevated troponin    Femoral DVT (deep venous thrombosis) (HCC)    GERD (gastroesophageal reflux disease)    Hyperlipidemia    Hypertension    Lactic acidosis    Obstructive cardiovascular shock ([place]) 03/22/2022   Pneumonia    Positive colorectal cancer screening using Cologuard test    Pulmonary embolism ([place])    03/22/22   Past Surgical History:  Procedure Laterality Date   BREAST CYST EXCISION Right    pt stated in [place] Left 07/10/2022   Procedure: LEFT BREAST LUMPECTOMY WITH RADIOACTIVE SEED AND LEFT  SENTINEL LYMPH NODE MAPPING;  Surgeon: Erroll Luna, MD;  Location: [place];  Service: General;  Laterality: Left;   BREAST SURGERY     other GI testing     unsure but had to drink a chalky drink    PORTACATH PLACEMENT N/A 11/27/2021   Procedure: INSERTION PORT-A-CATH;  Surgeon: Erroll Luna, MD;  Location: [facility];  Service: General;  Laterality: N/A;   RADIOACTIVE SEED GUIDED AXILLARY SENTINEL LYMPH NODE Left 07/10/2022   Procedure: LEFT AXILLARY SEED LYMPH NODE BIOPSY;  Surgeon: Erroll Luna, MD;  Location: [place];  Service: General;  Laterality: Left;   TONSILLECTOMY     TUBAL LIGATION     Patient Active Problem List   Diagnosis Date  Noted   Right leg DVT ([place]) 03/25/2022   Pneumonia 03/23/2022   AKI (acute kidney injury) ([place]) 03/23/2022   Acute massive pulmonary embolism ([place]) 03/22/2022   GERD (gastroesophageal reflux disease)    Chronic anemia    Cardiogenic shock ([place])    Anxiety 12/08/2021   Genetic testing 11/30/2021   Port-A-Cath in place 11/28/2021   Malignant neoplasm of upper-inner quadrant of left breast in female, estrogen receptor positive ([place]) 11/06/2021   Hypertension 03/15/2020    PCP: Everardo Beals, NP   REFERRING PROVIDER: Erroll Luna, MD   REFERRING DIAG: Left breast cancer  THERAPY DIAG:  Stiffness of left shoulder, not elsewhere classified  Aftercare following surgery for neoplasm  Abnormal posture  Malignant neoplasm of upper-inner quadrant of left breast in female, estrogen receptor positive ([place])  Rationale for Evaluation and Treatment Rehabilitation  ONSET DATE: 10/19/21  SUBJECTIVE:  SUBJECTIVE STATEMENT: I am doing ok since surgery. I can't wash my back but I have never been able to do that.   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 It measures 2 cm and is located in the upper inner quadrant. It is weakly ER positive, PR negative and HER2 negative with a Ki67 of 60%. L breast lumpectomy and SLNB on 07/10/22 (1/4), had blood clots in her lung  PATIENT GOALS:  Reassess how my recovery is going related to arm function, pain, and swelling.  PAIN:  Are you having pain? 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 but had to stop because it flares up her hip   OBJECTIVE:   PATIENT  SURVEYS:  QUICK DASH:  Quick Dash - 08/02/22 0001     Open a tight or new jar No difficulty    Do heavy household chores (wash walls, wash floors) Moderate difficulty    Carry a shopping bag or briefcase Mild difficulty    Wash your back Unable    Use a knife to cut food No difficulty    Recreational activities in which you take some force or impact through your arm, shoulder, or hand (golf, hammering, tennis) Mild difficulty    During the past week, to what extent has your arm, shoulder or hand problem interfered with your normal social activities with family, friends, neighbors, or groups? Slightly    During the past week, to what extent has your arm, shoulder or hand problem limited your work or other regular daily activities Slightly    Arm, shoulder, or hand pain. Mild    Tingling (pins and needles) in your arm, shoulder, or hand None    Difficulty Sleeping Mild difficulty    DASH Score 27.27 %              OBSERVATIONS:  Well healing lumpectomy and SLNB scars thought increased scar tissue palpable  POSTURE:  Forward head and rounded shoulders posture   UPPER EXTREMITY AROM/PROM:   A/PROM Right 11/08/2021 Left 11/08/2021 Left 08/02/22  Shoulder extension 51 55 68  Shoulder flexion 155 141 131  Shoulder abduction 150 135 105  Shoulder internal rotation 55 66 46  Shoulder external rotation 84 88 88                          (Blank rows = not tested)       CERVICAL AROM: All within normal limits   UPPER EXTREMITY STRENGTH: WNL     LYMPHEDEMA ASSESSMENTS:    LANDMARK RIGHT 11/08/2021 RIGHT 10.12.23 LEFT 11/08/2021 LEFT 08/02/22  10 cm proximal to olecranon process 32 30.9 32.7 31  Olecranon process 27 27.5 27.'8 27  10 ' cm proximal to ulnar styloid process 24.9 22.8 25.2 23.4  Just proximal to ulnar styloid process 17.8 17.4 17.5 17.1  Across hand at thumb web space 19.8 20.5 21.3 19.9  At base of 2nd digit 6.6 7 7.2 6.6  (Blank rows = not  tested)     Surgery type/Date: L lumpectomy and SLNB 07/10/22 Number of lymph nodes removed: 1/4 Current/past treatment (chemo, radiation, hormone therapy): did neo adj chemo had to stop early due to blood clot, then will continue chemo pills and have radiation at the end of this month  Other symptoms:  Heaviness/tightness No Pain No Pitting edema No Infections No Decreased scar mobility Yes Stemmer sign No   PATIENT EDUCATION:  Education details: ABC class, scar mobilization, importance of exercise, use compression bra Person educated: Patient Education method: Explanation and Handouts Education comprehension: verbalized understanding   HOME EXERCISE PROGRAM:  Reviewed previously given post op HEP. Begin walking or using stationary bike  ASSESSMENT:  CLINICAL IMPRESSION: Pt returns to PT after completing neoadjuvant chemo and undergoing a L lumpectomy and SLNB (1/4) on 07/10/22. Pt reports she has not begun exercising because she has been fearful of pulling her stitches. Educated pt today in post op exercises and educated her to begin them. She would benefit from skilled PT services to improve L shoulder ROM and scar mobility.   Pt will benefit from skilled therapeutic intervention to improve on the following deficits: Decreased knowledge of precautions, impaired UE functional use, pain, decreased ROM, postural dysfunction.   PT treatment/interventions: ADL/Self care home management, Therapeutic exercises, Therapeutic activity, Patient/Family education, Self Care, Joint mobilization, Orthotic/Fit training, Manual lymph drainage, Taping, Manual therapy, and Re-evaluation     GOALS: Goals reviewed with patient? Yes  LONG TERM GOALS:  (STG=LTG)  GOALS Name Target Date  Goal status  1 Pt will demonstrate she has regained full shoulder ROM and function post operatively compared to baselines.  Baseline: 08/30/2022 INITIAL  2 Pt will demonstrate 141 degrees of left shoulder  flexion to allow her to reach overhead. Baseline: 131 08/30/2022 INITIAL  3 Pt will demonstrate 135 degrees of L shoulder abduction to allow her to reach out to the side. Baseline: 105 08/30/2022 INITIAL  4 Pt will be independent in a home exercise program for continued stretching and strengthening. 08/30/2022 INITIAL     PLAN: PT FREQUENCY/DURATION: 2x/wk for 4 wks  PLAN FOR NEXT SESSION: pulleys, ball, PROM to L shoulder   [facility]  [address], [address]  [place] [address]  [phone]  After Breast Cancer Class It is recommended you attend the ABC class to be educated on lymphedema risk reduction. This class is free of charge and lasts for 1 hour. It is a 1-time class. You will need to download the Webex app either on your phone or computer. We will send you a link the night before or the morning of the class. You should be able to click on that link to join the class. This is not a confidential class. You don't have to turn your camera on, but other participants may be able to see your email address.  Scar massage You can begin gentle scar massage to you incision sites. Gently place one hand on the incision and move the skin (without sliding on the skin) in various directions. Do this for a few minutes and then you can gently massage either coconut oil or vitamin E cream into the scars.  Compression garment You should continue wearing your compression bra until you feel like you no longer have swelling.  Home exercise Program Continue doing the exercises you were given until you feel like you can do them without feeling any tightness at the end.   Walking Program Studies show that 30 minutes of walking per day (fast enough to elevate your heart rate) can significantly reduce the risk of a cancer recurrence. If you can't walk due to other medical reasons, we encourage you to find another activity you could do (like a stationary bike or water  exercise).  Posture After  breast cancer surgery, people frequently sit with rounded shoulders posture because it puts their incisions on slack and feels better. If you sit like this and scar tissue forms in that position, you can become very tight and have pain sitting or standing with good posture. Try to be aware of your posture and sit and stand up tall to heal properly.  Follow up PT: It is recommended you return every 3 months for the first 3 years following surgery to be assessed on the SOZO machine for an L-Dex score. This helps prevent clinically significant lymphedema in 95% of patients. These follow up screens are 10 minute appointments that you are not billed for.  [employer], PT 08/02/2022, 10:59 AM

## 2022-08-03 ENCOUNTER — Other Ambulatory Visit: Payer: Self-pay

## 2022-08-03 ENCOUNTER — Telehealth: Payer: Medicare HMO | Admitting: Hematology

## 2022-08-03 NOTE — Progress Notes (Signed)
Location of Breast Cancer:upper-inner quadrant of left breast    Histology per Pathology Report:   10/27/2021 Diagnosis 1. Breast, left, needle core biopsy, 12 o'clock, ribbon - FIBROADENOMA - NO MALIGNANCY IDENTIFIED 2. Breast, left, needle core biopsy, 10 o'clock, coil - INVASIVE DUCTAL CARCINOMA WITH NECROSIS - DUCTAL CARCINOMA IN SITU - SEE COMMENT 3. Lymph node, needle/core biopsy, left axilla, tribell - INVASIVE DUCTAL CARCINOMA WITH NECROSIS - NO NODAL TISSUE IDENTIFIED  07/10/2022 FINAL MICROSCOPIC DIAGNOSIS:   A. BREAST, LEFT, LUMPECTOMY:  - No residual carcinoma identified  - Treatment effect present (see comment)  - Ductal carcinoma in situ present, ypTis  - Margins negative for ductal carcinoma in situ  - See oncology table   B. BREAST, LEFT ADDITIONAL MEDIAL MARGIN, EXCISION:  - Negative for carcinoma   C. BREAST, LEFT ADDITIONAL LATERAL MARGIN, EXCISION:  - Negative for carcinoma   D. LYMPH NODE, LEFT AXILLARY, SENTINEL, EXCISION:  - Positive for carcinoma (1/1)   E. LYMPH NODE, LEFT AXILLARY, SENTINEL, EXCISION:  - Negative for carcinoma (0/1)   F. LYMPH NODE, LEFT, AXILLARY CONTENTS:  - Two lymph nodes identified  - Negative for carcinoma (0/2)   Receptor Status: ER(30%), PR (0%), Her2-neu (negative), Ki-(80%)  Did patient present with symptoms (if so, please note symptoms) or was this found on screening mammography?: had palpable left breast and axillary masses.   Past/Anticipated interventions by surgeon, if QQI:[facility] breast seed localized lumpectomy using 2 seeds and seed localized left axillar lymph node biopsy with left axillary sent lymph node mapping using mag trace and completion left axillary lymph node dissection due to grossly abnormal lymph nodes 07/10/2022 by Dr. Brantley Stage.  Past/Anticipated interventions by medical oncology, if any: Chemotherapy s/p 4 cycles of neoadjuvant AC 09/27/21 - 01/08/22, weekly taxol 01/22/22 - 03/20/22, discontinued due  to hospitalization for massive PE and cardiogenic shock,  anastrozole 04/17/22 - 06/22/22. Xeloda twice daily with concurrent radiation.   Lymphedema issues, if any:  {:18581} {t:21944}   Pain issues, if any:  {:18581} {PAIN DESCRIPTION:21022940}  SAFETY ISSUES: Prior radiation? {:18581} Pacemaker/ICD? {:18581} Possible current pregnancy?no Is the patient on methotrexate? {:18581}  Current Complaints / other details:  ***    Jacqulyn Liner, RN 08/03/2022,8:19 AM

## 2022-08-05 NOTE — Progress Notes (Signed)
Radiation Oncology         [phone] ________________________________  Name: Cassandra Brennan MRN: [id number]  Date: 08/06/2022  DOB: [birth year]  Re-Evaluation Note  CC: Everardo Beals, NP  Truitt Merle, MD  No diagnosis found.  Diagnosis: S/p neoadjuvant chemotherapy, anastrozole, and left lumpectomy: Left Breast UIQ, DCIS and 1 positive axillary SLN with no residual invasive disease in the breast. 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 She was seen in the multidisciplinary breast clinic on 11/08/21.   She underwent genetic testing on her consultation date. Results showed no clinically significant variants detected by +RNAinsight testing.  Bilateral breast MRI on 11/15/21 showed the biopsy proven malignancy in the upper inner left breat measuring 1.9 cm, a biopsy proven malignant 4.3 cm left axillary lymph node, 2 other abnormal lymph node's measuring 1.3 and 1.5 cm, and a 1.3 liver mass likely representing a cyst.  Whole body bone scan on 11/22/21 showed no evidence of bony metastatic disease  CT of the chest abdomen and pelvis on 11/23/21 showed  the biopsy proven left breast cancer, bulky axillary adenopathy, a liver cyst, and tiny indeterminate liver nodules too small to characterize. CT otherwise showed no evidence of distant metastatic disease.  She has been treated with 4 cycles of neoadjuvant AC from 09/27/21 through 01/08/22. She tolerated AC moderately well other than fatigue and nausea. Her breast mass and adenopathy were no longer palpable following treatment. She then received weekly taxol from 01/22/22 through 03/20/22. This was discontinued at cycle 9 due to hospitalization (admitted 03/22/22) for a  massive PE and cardiogenic shock. CT  of the abdomen and pelvis performed while inpatient showed large bilateral pulmonary emboli with a right heart strain. At discharge she was started on eliquis which she is tolerating well. Following taxol, she received anastrozole from 04/17/22 through 06/22/22. This was initiated early due to an delay in surgery from her hospitalization, and discontinued on 06/22/22 due to arthralgias.   Bilateral breast MRI on 04/12/22 showed evidence of a complete treatment response in the upper inner left breast malignancy, metastatic left axillary lymph node, and the 2 adjacent previously abnormal lymph nodes in the left axilla. MRI also redemonstrated the benign cyst in the left hepatic lobe.   She opted to proceed with left breast lumpectomy with SLN biopsies on 07/10/22 under the care of Dr. Brantley Stage. Pathology from the procedure revealed no residual invasive carcinoma but residual DCIS. All margins negative for in-situ disease. Nodal status of 1/2 left axillary sentinel lymph nodes positive for carcinoma, and 2/2 left axillary lymph nodes negative for carcinoma. Repeat prognostic indicators for the positive node showed: estrogen receptor 20% positive with weak staining intensity; progesterone receptor 2% positive with moderate staining intensity; Her2 status negative.   Given her weakly positive and low percentage ER expression in her positive lymph node, Dr. Burr Medico has recommended adjuvant Xeloda for 6 months concurrent with radiation. After she completes radiation, Dr. Burr Medico recommends switching to tamoxifen or exemestane given her issues with arthralgia from anastrozole.   Of note: The patient had several ED encounters this past February.  -- On 12/07/21 she presented to the ED with lower back pain. X-ray of the lumbar spine showed evidence of osteoarthritic facet disease,  degenerative disc disease, and endplate spurring. No acute fractures, dislocations, or osseous  abnormalities were appreciated. Work-up was overall reassuring and she was given tylenol for a suspected muscle strain.  -- She returned to the ED later that day for evaluation of an episode of lightheadedness and near syncope. She also endorsed urinary frequency since leaving the ED earlier that day, redness around her port site, and redness to the left breast. Work-up showed findings suspicious for sepsis secondary to uti and possibly bacteremia from an infected port. She was treated with antibiotics and discharged home.   On review of systems, the patient reports ***. She denies *** and any other symptoms.    Allergies:  is allergic to prednisone, rosuvastatin, other, robitussin (alcohol free) [guaifenesin], and sulfa antibiotics.  Meds: Current Outpatient Medications  Medication Sig Dispense Refill   acetaminophen (TYLENOL) 500 MG tablet Take 500 mg by mouth every 8 (eight) hours as needed for moderate pain.     ALPRAZolam (XANAX) 0.25 MG tablet Take 1 tablet (0.25 mg total) by mouth daily as needed for anxiety. 30 tablet 0   anastrozole (ARIMIDEX) 1 MG tablet Take 1 tablet (1 mg total) by mouth daily. 30 tablet 2   capecitabine (XELODA) 500 MG tablet Take 3 tablets (1,500 mg total) by mouth 2 (two) times daily after a meal. Take on days of radiation, Monday through Friday 90 tablet 1   ELIQUIS 5 MG TABS tablet TAKE 1 TABLET(5 MG) BY MOUTH TWICE DAILY 60 tablet 2   lidocaine-prilocaine (EMLA) cream Apply to affected area once 30 g 3   LISINOPRIL PO Take by mouth.     metoprolol succinate (TOPROL-XL) 100 MG 24 hr tablet Take 100 mg by mouth daily. Take with or immediately following a meal.     Multiple Vitamins-Minerals (MULTIVITAMIN ADULTS) TABS Take 1 tablet by mouth daily.     nitrofurantoin, macrocrystal-monohydrate, (MACROBID) 100 MG capsule Take 1 capsule (100 mg total) by mouth 2 (two) times daily. 10 capsule 0   ondansetron (ZOFRAN) 8 MG tablet Take 1 tablet (8 mg total) by mouth 2  (two) times daily as needed. Start on the third day after chemotherapy. (Patient taking differently: Take 8 mg by mouth 2 (two) times daily as needed for nausea or vomiting. Start on the third day after chemotherapy.) 30 tablet 1   oxyCODONE (OXY IR/ROXICODONE) 5 MG immediate release tablet Take 1 tablet (5 mg total) by mouth every 6 (six) hours as needed for severe pain. 15 tablet 0   phenazopyridine (PYRIDIUM) 200 MG tablet Take 1 tablet (200 mg total) by mouth 3 (three) times daily. 6 tablet 0   pravastatin (PRAVACHOL) 20 MG tablet Take 1 tablet (20 mg total) by mouth daily. 30 tablet 0   prochlorperazine (COMPAZINE) 10 MG tablet Take 1 tablet (10 mg total) by mouth every 6 (six) hours as needed (Nausea or vomiting). 30 tablet 1   No current facility-administered medications for this encounter.    Physical Findings: The patient is in no acute distress. Patient is alert and oriented.  vitals were not taken for this visit.  No significant changes. Lungs are clear to auscultation bilaterally. Heart has regular rate and rhythm. No palpable cervical, supraclavicular, or axillary adenopathy. Abdomen soft, non-tender, normal bowel sounds. Right Breast: no palpable mass, nipple discharge or bleeding. Left Breast: ***  Lab Findings: Lab Results  Component Value Date   WBC 7.3 07/23/2022   HGB 11.3 (L) 07/23/2022   HCT 32.8 (L) 07/23/2022  MCV 84.8 07/23/2022   PLT 288 07/23/2022    Radiographic Findings: MM Breast Surgical Specimen  Result Date: 07/10/2022 CLINICAL DATA:  Assess surgical specimen following radioactive seed localization a left axillary lymph node. EXAM: SPECIMEN RADIOGRAPH OF THE LEFT AXILLA COMPARISON:  Previous exam(s). FINDINGS: Status post excision of the left axilla. The radioactive seed and biopsy marker clip are present, completely intact, and were marked for pathology. IMPRESSION: Specimen radiograph of the left axilla. Electronically Signed   By: Lajean Manes M.D.    On: 07/10/2022 08:43  MM Breast Surgical Specimen  Result Date: 07/10/2022 CLINICAL DATA:  Specimen mammogram following radioactive seed localization and excision of 2 adjacent left breast lesions. EXAM: SPECIMEN RADIOGRAPH OF THE LEFT BREAST COMPARISON:  Previous exam(s). FINDINGS: Status post excision of the left breast. Both radioactive seeds and the ribbon and coil clips are within the specimen. IMPRESSION: Specimen radiograph of the left breast. Electronically Signed   By: Lajean Manes M.D.   On: 07/10/2022 08:12  MM CLIP PLACEMENT LEFT  Result Date: 07/09/2022 CLINICAL DATA:  Mammogram status post radioactive seed localization of a left axillary lymph node. EXAM: DIAGNOSTIC LEFT MAMMOGRAM POST ULTRASOUND-GUIDED RADIOACTIVE SEED PLACEMENT COMPARISON:  Previous exam(s). FINDINGS: Mammographic images were obtained following ultrasound-guided radioactive seed placement. These demonstrate the radioactive seed is along the anterior margin of the abnormal lymph node with the biopsy marking clip. IMPRESSION: Appropriate location of the radioactive seed. Final Assessment: Post Procedure Mammograms for Seed Placement Electronically Signed   By: Ammie Ferrier M.D.   On: 07/09/2022 14:25  [place] LT RADIOACTIVE SEED LOC  Result Date: 07/09/2022 CLINICAL DATA:  66 year old female presenting for radioactive seed localization of a metastatic left axillary lymph node. EXAM: ULTRASOUND GUIDED RADIOACTIVE SEED LOCALIZATION OF THE LEFT BREAST COMPARISON:  Previous exam(s). FINDINGS: Patient presents for radioactive seed localization prior to targeted left axillary lymph node dissection. I met with the patient and we discussed the procedure of seed localization including benefits and alternatives. We discussed the high likelihood of a successful procedure. We discussed the risks of the procedure including infection, bleeding, tissue injury and further surgery. We discussed the low dose of radioactivity involved in the  procedure. Informed, written consent was given. The usual time-out protocol was performed immediately prior to the procedure. Using ultrasound guidance, sterile technique, 1% lidocaine and an I-125 radioactive seed, the abnormal lymph node in the left axilla was localized using a inferolateral approach. The follow-up mammogram images confirm the seed in the expected location and were marked for Dr. Brantley Stage. Follow-up survey of the patient confirms presence of the radioactive seed. Order number of I-125 seed:  [id number]. Total activity:  6.834 millicuries reference Date: 06/11/2022 The patient tolerated the procedure well and was released from the [facility]. She was given instructions regarding seed removal. IMPRESSION: Radioactive seed localization left axilla. No apparent complications. Electronically Signed   By: Ammie Ferrier M.D.   On: 07/09/2022 14:24  MM LT RADIOACTIVE SEED LOC MAMMO GUIDE  Result Date: 07/09/2022 CLINICAL DATA:  66 year old female presenting for radioactive seed localization of a malignancy in the upper inner left breast and a benign but discordant biopsy site in the superior left breast. EXAM: MAMMOGRAPHIC GUIDED RADIOACTIVE SEED LOCALIZATION OF THE LEFT BREAST COMPARISON:  Previous exam(s). FINDINGS: Patient presents for radioactive seed localization prior to left breast lumpectomy and excisional biopsy. I met with the patient and we discussed the procedure of seed localization including benefits and alternatives. We discussed the high likelihood of  a successful procedure. We discussed the risks of the procedure including infection, bleeding, tissue injury and further surgery. We discussed the low dose of radioactivity involved in the procedure. Informed, written consent was given. The usual time-out protocol was performed immediately prior to the procedure. Using mammographic guidance, sterile technique, 1% lidocaine and an I-125 radioactive seed, the coil shaped biopsy marking  clip in the upper inner left breast was localized using a superior approach. The follow-up mammogram images confirm the seed in the expected location and were marked for Dr. Brantley Stage. Follow-up survey of the patient confirms presence of the radioactive seed. Order number of I-125 seed:  [id number]. Total activity:  4.431 millicuries reference Date: 07/05/2022 Using mammographic guidance, sterile technique, 1% lidocaine and an I-125 radioactive seed, the ribbon shaped biopsy marking clip in the superior left breast was localized using a superior approach. The follow-up mammogram images confirm the seed in the expected location and were marked for Dr. Brantley Stage. Follow-up survey of the patient confirms presence of the radioactive seed. Order number of I-125 seed:  [id number]. Total activity:  9.509 millicuries reference Date: 07/05/2022 The patient tolerated the procedure well and was released from the [facility]. She was given instructions regarding seed removal. IMPRESSION: Radioactive seed localization left breast x2. No apparent complications. Electronically Signed   By: Ammie Ferrier M.D.   On: 07/09/2022 14:22  MM LT RAD SEED EA ADD LESION LOC MAMMO  Result Date: 07/09/2022 CLINICAL DATA:  66 year old female presenting for radioactive seed localization of a malignancy in the upper inner left breast and a benign but discordant biopsy site in the superior left breast. EXAM: MAMMOGRAPHIC GUIDED RADIOACTIVE SEED LOCALIZATION OF THE LEFT BREAST COMPARISON:  Previous exam(s). FINDINGS: Patient presents for radioactive seed localization prior to left breast lumpectomy and excisional biopsy. I met with the patient and we discussed the procedure of seed localization including benefits and alternatives. We discussed the high likelihood of a successful procedure. We discussed the risks of the procedure including infection, bleeding, tissue injury and further surgery. We discussed the low dose of radioactivity  involved in the procedure. Informed, written consent was given. The usual time-out protocol was performed immediately prior to the procedure. Using mammographic guidance, sterile technique, 1% lidocaine and an I-125 radioactive seed, the coil shaped biopsy marking clip in the upper inner left breast was localized using a superior approach. The follow-up mammogram images confirm the seed in the expected location and were marked for Dr. Brantley Stage. Follow-up survey of the patient confirms presence of the radioactive seed. Order number of I-125 seed:  [id number]. Total activity:  0.998 millicuries reference Date: 07/05/2022 Using mammographic guidance, sterile technique, 1% lidocaine and an I-125 radioactive seed, the ribbon shaped biopsy marking clip in the superior left breast was localized using a superior approach. The follow-up mammogram images confirm the seed in the expected location and were marked for Dr. Brantley Stage. Follow-up survey of the patient confirms presence of the radioactive seed. Order number of I-125 seed:  [id number]. Total activity:  7.673 millicuries reference Date: 07/05/2022 The patient tolerated the procedure well and was released from the [facility]. She was given instructions regarding seed removal. IMPRESSION: Radioactive seed localization left breast x2. No apparent complications. Electronically Signed   By: Ammie Ferrier M.D.   On: 07/09/2022 14:22   Impression:  S/p neoadjuvant chemotherapy, anastrozole, and left lumpectomy: Left Breast UIQ, DCIS and 1 positive axillary SLN with no residual invasive disease in the breast. 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 ***  -----------------------------------  Blair Promise, PhD, MD  This document serves as a record of services personally performed by Gery Pray, MD. It was created on his behalf by Roney Mans, a trained medical scribe. The creation of this record is based on the scribe's personal observations and the provider's statements to them. This document has been checked and approved by the attending provider.

## 2022-08-06 ENCOUNTER — Other Ambulatory Visit: Payer: Self-pay

## 2022-08-06 ENCOUNTER — Ambulatory Visit
Admission: RE | Admit: 2022-08-06 | Discharge: 2022-08-06 | Disposition: A | Discharge status: Home / Self Care | Payer: Medicare HMO | Source: Ambulatory Visit | Attending: Radiation Oncology | Admitting: Radiation Oncology

## 2022-08-06 ENCOUNTER — Encounter: Payer: Self-pay | Admitting: Radiation Oncology

## 2022-08-06 ENCOUNTER — Ambulatory Visit: Payer: Medicare HMO | Admitting: Rehabilitation

## 2022-08-06 ENCOUNTER — Encounter: Payer: Self-pay | Admitting: Rehabilitation

## 2022-08-06 ENCOUNTER — Other Ambulatory Visit (HOSPITAL_COMMUNITY): Payer: Self-pay

## 2022-08-06 VITALS — BP 139/86 | HR 104 | Temp 96.8°F | Resp 18 | Ht 65.0 in | Wt 171.0 lb

## 2022-08-06 DIAGNOSIS — Z170 Estrogen receptor positive status [ER+]: Secondary | ICD-10-CM | POA: Insufficient documentation

## 2022-08-06 DIAGNOSIS — Z79811 Long term (current) use of aromatase inhibitors: Secondary | ICD-10-CM | POA: Diagnosis not present

## 2022-08-06 DIAGNOSIS — R293 Abnormal posture: Secondary | ICD-10-CM

## 2022-08-06 DIAGNOSIS — Z79899 Other long term (current) drug therapy: Secondary | ICD-10-CM | POA: Insufficient documentation

## 2022-08-06 DIAGNOSIS — M25612 Stiffness of left shoulder, not elsewhere classified: Secondary | ICD-10-CM

## 2022-08-06 DIAGNOSIS — C50212 Malignant neoplasm of upper-inner quadrant of left female breast: Secondary | ICD-10-CM | POA: Insufficient documentation

## 2022-08-06 DIAGNOSIS — Z7901 Long term (current) use of anticoagulants: Secondary | ICD-10-CM | POA: Insufficient documentation

## 2022-08-06 DIAGNOSIS — Z510 Encounter for antineoplastic radiation therapy: Secondary | ICD-10-CM | POA: Diagnosis not present

## 2022-08-06 DIAGNOSIS — Z483 Aftercare following surgery for neoplasm: Secondary | ICD-10-CM

## 2022-08-06 MED ORDER — CAPECITABINE 500 MG PO TABS
825.0000 mg/m2 | ORAL_TABLET | Freq: Two times a day (BID) | ORAL | 1 refills | Status: DC
  Filled 2022-08-06: qty 96, 22d supply, fill #0
  Filled 2022-08-06: qty 99, 17d supply, fill #0
  Filled 2022-08-24: qty 102, 17d supply, fill #1

## 2022-08-06 NOTE — Telephone Encounter (Signed)
Oral Chemotherapy Pharmacist Encounter  I spoke with patient for overview of: Xeloda for the treatment of stage IIB breast cancer, ER/PR positive, HER-2 negative in conjunction with radiation, planned for ~6.5 weeks.  Counseled patient on administration, dosing, side effects, monitoring, drug-food interactions, safe handling, storage, and disposal.  Patient will take Xeloda 500mg tablets, 3 tablets (1500mg) by mouth in AM and 3 tabs (1500mg) by mouth in PM, within 30 minutes of finishing meals, on days of radiation only.  Xeloda and radiation start date: 08/14/22  Adverse effects of Xeloda include but are not limited to: fatigue, decreased blood counts, GI upset, diarrhea, mouth sores, and hand-foot syndrome. Hand-foot syndrome: discussed use of cream such as [place] Smooth Extra Care 20 or equivalent advanced care cream that has 20% urea content for advanced skin hydration while on Xeloda Diarrhea: Patient will obtain Imodium (loperamide) to have on hand if they experience diarrhea. Patient knows to alert the office of 4 or more loose stools above baseline. Nausea: Patient has anti-emetic on hand and knows to take it if nausea develops.    Reviewed with patient importance of keeping a medication schedule and plan for any missed doses. No barriers to medication adherence identified.  Medication reconciliation performed and medication/allergy list updated.  All questions answered.  Ms. Surowiec voiced understanding and appreciation.   Medication education handout placed in mail for patient. Patient knows to call the office with questions or concerns. Oral Chemotherapy Clinic phone number provided to patient.   Rebecca Fanning, PharmD, BCPS, BCOP Hematology/Oncology Clinical Pharmacist [place] [facility] [place] [facility] [phone] 08/06/2022 2:23 PM 

## 2022-08-06 NOTE — Therapy (Signed)
OUTPATIENT PHYSICAL THERAPY BREAST CANCER TREATMENT   Patient Name: Cassandra Brennan MRN: [id number] DOB:Apr 14, 1956, 66 y.o., female Today's Date: 08/06/2022   PT End of Session - 08/06/22 1059     Visit Number 3    Number of Visits 10    Date for PT Re-Evaluation 08/30/22    PT Start Time 18    PT Stop Time 1142    PT Time Calculation (min) 42 min    Activity Tolerance Patient tolerated treatment well    Behavior During Therapy WFL for tasks assessed/performed             Past Medical History:  Diagnosis Date   Acute dyspnea    Acute metabolic encephalopathy    Anxiety    Arthritis    right knee    Elevated troponin    Femoral DVT (deep venous thrombosis) (HCC)    GERD (gastroesophageal reflux disease)    Hyperlipidemia    Hypertension    Lactic acidosis    Obstructive cardiovascular shock ([place]) 03/22/2022   Pneumonia    Positive colorectal cancer screening using Cologuard test    Pulmonary embolism ([place])    03/22/22   Past Surgical History:  Procedure Laterality Date   BREAST CYST EXCISION Right    pt stated in [place] Left 07/10/2022   Procedure: LEFT BREAST LUMPECTOMY WITH RADIOACTIVE SEED AND LEFT  SENTINEL LYMPH NODE MAPPING;  Surgeon: Erroll Luna, MD;  Location: [facility];  Service: General;  Laterality: Left;   BREAST SURGERY     other GI testing     unsure but had to drink a chalky drink    PORTACATH PLACEMENT N/A 11/27/2021   Procedure: INSERTION PORT-A-CATH;  Surgeon: Erroll Luna, MD;  Location: [facility];  Service: General;  Laterality: N/A;   RADIOACTIVE SEED GUIDED AXILLARY SENTINEL LYMPH NODE Left 07/10/2022   Procedure: LEFT AXILLARY SEED LYMPH NODE BIOPSY;  Surgeon: Erroll Luna, MD;  Location: [facility];  Service: General;  Laterality: Left;   TONSILLECTOMY     TUBAL LIGATION     Patient Active Problem List   Diagnosis Date Noted    Right leg DVT ([place]) 03/25/2022   Pneumonia 03/23/2022   AKI (acute kidney injury) ([place]) 03/23/2022   Acute massive pulmonary embolism ([place]) 03/22/2022   GERD (gastroesophageal reflux disease)    Chronic anemia    Cardiogenic shock ([place])    Anxiety 12/08/2021   Genetic testing 11/30/2021   Port-A-Cath in place 11/28/2021   Malignant neoplasm of upper-inner quadrant of left breast in female, estrogen receptor positive ([place]) 11/06/2021   Hypertension 03/15/2020    PCP: Everardo Beals, NP   REFERRING PROVIDER: Erroll Luna, MD   REFERRING DIAG: Left breast cancer  THERAPY DIAG:  Stiffness of left shoulder, not elsewhere classified  Aftercare following surgery for neoplasm  Abnormal posture  Malignant neoplasm of upper-inner quadrant of left breast in female, estrogen receptor positive ([place])  Rationale for Evaluation and Treatment Rehabilitation  ONSET DATE: 10/19/21  SUBJECTIVE:  SUBJECTIVE STATEMENT:  I did my simulation today and it went very well.  No pain  PERTINENT HISTORY:  Patient was diagnosed on 10/19/2022 with left grade III invasive ductal carcinoma breast cancer. It measures 2 cm and is located in the upper inner quadrant. It is weakly ER positive, PR negative and HER2 negative with a Ki67 of 60%. L breast lumpectomy and SLNB on 07/10/22 (1/4), had blood clots in her lung  PATIENT GOALS:  Reassess how my recovery is going related to arm function, pain, and swelling.  PAIN:  Are you having pain? 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 but had to stop because it flares up her hip   OBJECTIVE:   OBSERVATIONS:  Well healing lumpectomy and SLNB scars  thought increased scar tissue palpable  POSTURE:  Forward head and rounded shoulders posture   UPPER EXTREMITY AROM/PROM:   A/PROM Right 11/08/2021 Left 11/08/2021 Left 08/02/22  Shoulder extension 51 55 68  Shoulder flexion 155 141 131  Shoulder abduction 150 135 105  Shoulder internal rotation 55 66 46  Shoulder external rotation 84 88 88                          (Blank rows = not tested)    LYMPHEDEMA ASSESSMENTS:    LANDMARK RIGHT 11/08/2021 RIGHT 10.12.23 LEFT 11/08/2021 LEFT 08/02/22  10 cm proximal to olecranon process 32 30.9 32.7 31  Olecranon process 27 27.5 27._0 cm proximal to ulnar styloid process 24.9 22.8 25.2 23.4  Just proximal to ulnar styloid process 17.8 17.4 17.5 17.1  Across hand at thumb web space 19.8 20.5 21.3 19.9  At base of 2nd digit 6.6 7 7.2 6.6  (Blank rows = not tested)    Surgery type/Date: L lumpectomy and SLNB 07/10/22 Number of lymph nodes removed: 1/4 Current/past treatment (chemo, radiation, hormone therapy): did neo adj chemo had to stop early due to blood clot, then will continue chemo pills and have radiation at the end of this month Other symptoms:  Heaviness/tightness No Pain No Pitting edema No Infections No Decreased scar mobility Yes Stemmer sign No  TODAY"S TREATMENT 08/06/22:  Pulleys flexion and abduction with cueing for initial instruction and for relaxation x 73mn each.   Ball flexion x 5  Seated posterior shoulder circles x 5  Seated ER/shoulder retraction x 5   Supine cane flexion x 5  Sidelying abduction AROM x 5  PROM: into flexion, abduction, ER to tolerance, then Rt sidelying scapular mobilizations all directions and over clothes STM  PATIENT EDUCATION:  Education details: ABC class, scar mobilization, importance of exercise, use compression bra Person educated: Patient Education method: Explanation and Handouts Education comprehension: verbalized understanding   HOME EXERCISE PROGRAM:  Reviewed  previously given post op HEP. Begin walking or using stationary bike  ASSESSMENT: CLINICAL IMPRESSION: Pt notes she has been able to close her car door recently which she could not do before therapy.    Pt will benefit from skilled therapeutic intervention to improve on the following deficits: Decreased knowledge of precautions, impaired UE functional use, pain, decreased ROM, postural dysfunction.   PT treatment/interventions: ADL/Self care home management, Therapeutic exercises, Therapeutic activity, Patient/Family education, Self Care, Joint mobilization, Orthotic/Fit training, Manual lymph drainage, Taping, Manual therapy, and Re-evaluation     GOALS: Goals  reviewed with patient? Yes  LONG TERM GOALS:  (STG=LTG)  GOALS Name Target Date  Goal status  1 Pt will demonstrate she has regained full shoulder ROM and function post operatively compared to baselines.  Baseline: 08/30/2022 INITIAL  2 Pt will demonstrate 141 degrees of left shoulder flexion to allow her to reach overhead. Baseline: 131 08/30/2022 INITIAL  3 Pt will demonstrate 135 degrees of L shoulder abduction to allow her to reach out to the side. Baseline: 105 08/30/2022 INITIAL  4 Pt will be independent in a home exercise program for continued stretching and strengthening. 08/30/2022 INITIAL     PLAN: PT FREQUENCY/DURATION: 2x/wk for 4 wks  PLAN FOR NEXT SESSION: pulleys, ball, PROM to L shoulder   [facility]  [address], [address]  [place] [address]  [phone]  After Breast Cancer Class It is recommended you attend the ABC class to be educated on lymphedema risk reduction. This class is free of charge and lasts for 1 hour. It is a 1-time class. You will need to download the Webex app either on your phone or computer. We will send you a link the night before or the morning of the class. You should be able to click on that link to join the class. This is not a confidential class. You  don't have to turn your camera on, but other participants may be able to see your email address.  Scar massage You can begin gentle scar massage to you incision sites. Gently place one hand on the incision and move the skin (without sliding on the skin) in various directions. Do this for a few minutes and then you can gently massage either coconut oil or vitamin E cream into the scars.  Compression garment You should continue wearing your compression bra until you feel like you no longer have swelling.  Home exercise Program Continue doing the exercises you were given until you feel like you can do them without feeling any tightness at the end.   Walking Program Studies show that 30 minutes of walking per day (fast enough to elevate your heart rate) can significantly reduce the risk of a cancer recurrence. If you can't walk due to other medical reasons, we encourage you to find another activity you could do (like a stationary bike or water exercise).  Posture After breast cancer surgery, people frequently sit with rounded shoulders posture because it puts their incisions on slack and feels better. If you sit like this and scar tissue forms in that position, you can become very tight and have pain sitting or standing with good posture. Try to be aware of your posture and sit and stand up tall to heal properly.  Follow up PT: It is recommended you return every 3 months for the first 3 years following surgery to be assessed on the SOZO machine for an L-Dex score. This helps prevent clinically significant lymphedema in 95% of patients. These follow up screens are 10 minute appointments that you are not billed for.  Stark Bray, PT 08/06/2022, 11:43 AM

## 2022-08-08 ENCOUNTER — Encounter: Payer: Self-pay | Admitting: Rehabilitation

## 2022-08-08 ENCOUNTER — Ambulatory Visit: Payer: Medicare HMO | Admitting: Rehabilitation

## 2022-08-08 DIAGNOSIS — C50212 Malignant neoplasm of upper-inner quadrant of left female breast: Secondary | ICD-10-CM

## 2022-08-08 DIAGNOSIS — M25612 Stiffness of left shoulder, not elsewhere classified: Secondary | ICD-10-CM

## 2022-08-08 DIAGNOSIS — R293 Abnormal posture: Secondary | ICD-10-CM

## 2022-08-08 DIAGNOSIS — Z483 Aftercare following surgery for neoplasm: Secondary | ICD-10-CM

## 2022-08-08 NOTE — Therapy (Signed)
OUTPATIENT PHYSICAL THERAPY BREAST CANCER TREATMENT   Patient Name: Cassandra Brennan MRN: [id number] DOB:1956/01/18, 66 y.o., female Today's Date: 08/08/2022     Past Medical History:  Diagnosis Date   Acute dyspnea    Acute metabolic encephalopathy    Anxiety    Arthritis    right knee    Elevated troponin    Femoral DVT (deep venous thrombosis) (HCC)    GERD (gastroesophageal reflux disease)    Hyperlipidemia    Hypertension    Lactic acidosis    Obstructive cardiovascular shock ([place]) 03/22/2022   Pneumonia    Positive colorectal cancer screening using Cologuard test    Pulmonary embolism ([place])    03/22/22   Past Surgical History:  Procedure Laterality Date   BREAST CYST EXCISION Right    pt stated in [place] Left 07/10/2022   Procedure: LEFT BREAST LUMPECTOMY WITH RADIOACTIVE SEED AND LEFT  SENTINEL LYMPH NODE MAPPING;  Surgeon: Erroll Luna, MD;  Location: [facility];  Service: General;  Laterality: Left;   BREAST SURGERY     other GI testing     unsure but had to drink a chalky drink    PORTACATH PLACEMENT N/A 11/27/2021   Procedure: INSERTION PORT-A-CATH;  Surgeon: Erroll Luna, MD;  Location[facility];  Service: General;  Laterality: N/A;   RADIOACTIVE SEED GUIDED AXILLARY SENTINEL LYMPH NODE Left 07/10/2022   Procedure: LEFT AXILLARY SEED LYMPH NODE BIOPSY;  Surgeon: Erroll Luna, MD;  Location: [facility];  Service: General;  Laterality: Left;   TONSILLECTOMY     TUBAL LIGATION     Patient Active Problem List   Diagnosis Date Noted   Right leg DVT ([place]) 03/25/2022   Pneumonia 03/23/2022   AKI (acute kidney injury) ([place]) 03/23/2022   Acute massive pulmonary embolism ([place]) 03/22/2022   GERD (gastroesophageal reflux disease)    Chronic anemia    Cardiogenic shock ([place])    Anxiety 12/08/2021   Genetic testing 11/30/2021   Port-A-Cath in place 11/28/2021    Malignant neoplasm of upper-inner quadrant of left breast in female, estrogen receptor positive ([place]) 11/06/2021   Hypertension 03/15/2020    PCP: Everardo Beals, NP   REFERRING PROVIDER: Erroll Luna, MD   REFERRING DIAG: Left breast cancer  THERAPY DIAG:  No diagnosis found.  Rationale for Evaluation and Treatment Rehabilitation  ONSET DATE: 10/19/21  SUBJECTIVE:  SUBJECTIVE STATEMENT: She is feeling pretty good and no major changes since last session other than minor soreness after the session.   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 It measures 2 cm and is located in the upper inner quadrant. It is weakly ER positive, PR negative and HER2 negative with a Ki67 of 60%. L breast lumpectomy and SLNB on 07/10/22 (1/4), had blood clots in her lung  PATIENT GOALS:  Reassess how my recovery is going related to arm function, pain, and swelling.  PAIN:  Are you having pain? 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 but had to stop because it flares up her hip   OBJECTIVE:   OBSERVATIONS:  Well healing lumpectomy and SLNB scars thought increased scar tissue palpable  POSTURE:  Forward head and rounded shoulders posture   UPPER EXTREMITY AROM/PROM:   A/PROM Right 11/08/2021 Left 11/08/2021 Left 08/02/22 08/08/2022  Shoulder extension 51 55 68   Shoulder flexion 155 141 131 145  Shoulder abduction 150 135 105 134  Shoulder internal rotation 55 66 46   Shoulder external rotation 84 88 88                           (Blank rows = not tested)    LYMPHEDEMA ASSESSMENTS:    LANDMARK  RIGHT 11/08/2021 RIGHT 10.12.23 LEFT 11/08/2021 LEFT 08/02/22  10 cm proximal to olecranon process 32 30.9 32.7 31  Olecranon process 27 27.5 27._0 cm proximal to ulnar styloid process 24.9 22.8 25.2 23.4  Just proximal to ulnar styloid process 17.8 17.4 17.5 17.1  Across hand at thumb web space 19.8 20.5 21.3 19.9  At base of 2nd digit 6.6 7 7.2 6.6  (Blank rows = not tested)    Surgery type/Date: L lumpectomy and SLNB 07/10/22 Number of lymph nodes removed: 1/4 Current/past treatment (chemo, radiation, hormone therapy): did neo adj chemo had to stop early due to blood clot, then will continue chemo pills and have radiation at the end of this month Other symptoms:  Heaviness/tightness No Pain No Pitting edema No Infections No Decreased scar mobility Yes Stemmer sign No  TODAY"S TREATMENT 08/08/2022  Pulleys flexion and abduction with cueing for initial instruction and for relaxation x 15mn each.   Ball flexion x 10  Seated posterior shoulder circles x10  Standing cane flexion x 10  Standing cane abduction  Sidelying abduction AROM x 10  Supine scapular squeezes   Supine ER stretch   PROM: into flexion, abduction, ER to tolerance, directions and over clothes STM  08/06/22:  Pulleys flexion and abduction with cueing for initial instruction and for relaxation x 331m each.   Ball flexion x 5  Seated posterior shoulder circles x 5  Seated ER/shoulder retraction x 5   Supine cane flexion x 5  Sidelying abduction AROM x 5  PROM: into flexion, abduction, ER to tolerance, then Rt sidelying scapular mobilizations all directions and over clothes STM  Discussed how PT is available for hip pain and that we could add it to the referral to show her some things to do for the OA.    PATIENT EDUCATION:  Education details: ABC class, scar mobilization, importance of exercise, use compression bra Person educated: Patient Education method: Explanation  and Handouts Education  comprehension: verbalized understanding   HOME EXERCISE PROGRAM:  Reviewed previously given post op HEP. Begin walking or using stationary bike  ASSESSMENT: CLINICAL IMPRESSION: Pt responded well to therapy today. Patient has regained ROM in the left arm to baseline measurements. Patient was educated and informed that whenever she feels like she is ready to be done she can be. She wants to finish out next weeks sessions to work on strengthening.    Pt will benefit from skilled therapeutic intervention to improve on the following deficits: Decreased knowledge of precautions, impaired UE functional use, pain, decreased ROM, postural dysfunction.   PT treatment/interventions: ADL/Self care home management, Therapeutic exercises, Therapeutic activity, Patient/Family education, Self Care, Joint mobilization, Orthotic/Fit training, Manual lymph drainage, Taping, Manual therapy, and Re-evaluation     GOALS: Goals reviewed with patient? Yes  LONG TERM GOALS:  (STG=LTG)  Updated:08/08/2022  GOALS Name Target Date  Goal status  1 Pt will demonstrate she has regained full shoulder ROM and function post operatively compared to baselines.  Baseline: 08/30/2022 MET  2 Pt will demonstrate 141 degrees of left shoulder flexion to allow her to reach overhead. Baseline: 131 08/30/2022 MET  3 Pt will demonstrate 135 degrees of L shoulder abduction to allow her to reach out to the side. Baseline: 105 08/30/2022 MET  4 Pt will be independent in a home exercise program for continued stretching and strengthening. 08/30/2022 IN PROGRESS     PLAN: PT FREQUENCY/DURATION: 2x/wk for 4 wks  PLAN FOR NEXT SESSION: Add strengthening exercises  Rosene Pilling, Student-PT 08/08/2022, 8:35 AM

## 2022-08-13 ENCOUNTER — Telehealth: Payer: Medicare HMO | Admitting: Hematology

## 2022-08-13 ENCOUNTER — Encounter: Payer: Self-pay | Admitting: *Deleted

## 2022-08-13 ENCOUNTER — Encounter: Payer: Self-pay | Admitting: Hematology

## 2022-08-13 ENCOUNTER — Inpatient Hospital Stay (HOSPITAL_BASED_OUTPATIENT_CLINIC_OR_DEPARTMENT_OTHER): Payer: Medicare HMO

## 2022-08-13 ENCOUNTER — Inpatient Hospital Stay: Payer: Medicare HMO | Admitting: Hematology

## 2022-08-13 VITALS — BP 118/77 | HR 105 | Temp 98.3°F | Resp 18 | Ht 65.0 in | Wt 169.5 lb

## 2022-08-13 DIAGNOSIS — C50212 Malignant neoplasm of upper-inner quadrant of left female breast: Secondary | ICD-10-CM

## 2022-08-13 DIAGNOSIS — Z510 Encounter for antineoplastic radiation therapy: Secondary | ICD-10-CM | POA: Diagnosis not present

## 2022-08-13 DIAGNOSIS — Z170 Estrogen receptor positive status [ER+]: Secondary | ICD-10-CM

## 2022-08-13 LAB — CBC WITH DIFFERENTIAL (CANCER CENTER ONLY)
Abs Immature Granulocytes: 0.02 10*3/uL (ref 0.00–0.07)
Basophils Absolute: 0 10*3/uL (ref 0.0–0.1)
Basophils Relative: 0 %
Eosinophils Absolute: 0.3 10*3/uL (ref 0.0–0.5)
Eosinophils Relative: 4 %
HCT: 35.8 % — ABNORMAL LOW (ref 36.0–46.0)
Hemoglobin: 12.2 g/dL (ref 12.0–15.0)
Immature Granulocytes: 0 %
Lymphocytes Relative: 16 %
Lymphs Abs: 1.3 10*3/uL (ref 0.7–4.0)
MCH: 29.5 pg (ref 26.0–34.0)
MCHC: 34.1 g/dL (ref 30.0–36.0)
MCV: 86.7 fL (ref 80.0–100.0)
Monocytes Absolute: 0.4 10*3/uL (ref 0.1–1.0)
Monocytes Relative: 5 %
Neutro Abs: 5.8 10*3/uL (ref 1.7–7.7)
Neutrophils Relative %: 75 %
Platelet Count: 244 10*3/uL (ref 150–400)
RBC: 4.13 MIL/uL (ref 3.87–5.11)
RDW: 14.9 % (ref 11.5–15.5)
WBC Count: 7.9 10*3/uL (ref 4.0–10.5)
nRBC: 0 % (ref 0.0–0.2)

## 2022-08-13 LAB — CMP (CANCER CENTER ONLY)
ALT: 30 U/L (ref 0–44)
AST: 16 U/L (ref 15–41)
Albumin: 4.3 g/dL (ref 3.5–5.0)
Alkaline Phosphatase: 98 U/L (ref 38–126)
Anion gap: 5 (ref 5–15)
BUN: 22 mg/dL (ref 8–23)
CO2: 32 mmol/L (ref 22–32)
Calcium: 9.6 mg/dL (ref 8.9–10.3)
Chloride: 102 mmol/L (ref 98–111)
Creatinine: 1.25 mg/dL — ABNORMAL HIGH (ref 0.44–1.00)
GFR, Estimated: 48 mL/min — ABNORMAL LOW (ref >60)
Glucose, Bld: 117 mg/dL — ABNORMAL HIGH (ref 70–99)
Potassium: 3.8 mmol/L (ref 3.5–5.1)
Sodium: 139 mmol/L (ref 135–145)
Total Bilirubin: 0.6 mg/dL (ref 0.3–1.2)
Total Protein: 7.5 g/dL (ref 6.5–8.1)

## 2022-08-13 NOTE — Progress Notes (Signed)
Cassandra Brennan   [phone] [phone]   Clinic Follow up Note   Patient Care Team: Everardo Beals, NP as PCP - General Erroll Luna, MD as Consulting Physician (General Surgery) Truitt Merle, MD as Consulting Physician (Hematology) Gery Pray, MD as Consulting Physician (Radiation Oncology) [name], RN as Oncology Nurse Navigator [name], RN as Oncology Nurse Navigator  Date of Service:  08/13/2022  CHIEF COMPLAINT: f/u of left breast cancer  CURRENT THERAPY:  Concurrent chemoRT with Xeloda, starting 08/15/22  -Xeloda dose: 1530m BID M-F  ASSESSMENT & PLAN:  JDELITA CHIQUITOis a 66y.o. post-menopausal female with   1. Malignant neoplasm of upper-inner quadrant of left breast, Stage IIB, c(T1c, N1), yp(T0, N1) ER 30% weak+, PR-/HER2-, Grade 3 -presented with palpable left breast and axillary masses. Biopsy 10/27/21 confirmed invasive ductal carcinoma with necrosis to both 10 o'clock and lymph node, grade 3. ER weakly positive (30%). -breast MRI 11/15/21 shows the biopsy proven malignancy in the upper inner left breat measuring 1.9 cm, a biopsy proven malignant 4.3 cm left axillary LN and 2 other abnormal LNs measuring 1.3 and 1.5 cm, and a 1.3 liver mass likely representing a cyst.  -CT CAP 11/23/21 shows known left breast cancer and bulky axillary adenopathy, a liver cyst, and tiny indeterminate liver nodules too small to characterize. We will monitor these in the future. No evidence of distant metastasis.  -Bone scan is negative for osseous metastasis.  -Baseline echo on 11/22/21 is normal, LVEF 55-60%. -s/p 4 cycles of neoadjuvant AC 09/27/21 - 01/08/22, tolerated moderate well with fatigue and nausea. Breast mass and adenopathy no longer palpable. -she received weekly taxol 01/22/22 - 03/20/22, discontinued due to hospitalization for massive PE and cardiogenic shock.  -she took anastrozole 04/17/22 - 06/22/22 due to delay in surgery from  hospitalization, discontinued due to arthralgias. -s/p left lumpectomy on 07/10/22 under Dr. CBrantley Stage path showed complete pathologic response in breast but 1/4 positive lymph nodes. Repeat prognostic panel on the positive node showed ER 20% weak, PR 2% moderate, and Her2 negative. -Given her weakly positive low percentage ER expression in her positive lymph node, I recommend adjuvant Xeloda for 6 months be start with concurrent radiation.    2. Massive B/L PE -s/p C9 taxol, she experienced fatigue, tachycardia, and dyspnea, admitted 03/22/22. CT showed large bilateral pulmonary emboli with right heart strain. -previously discussed this is likely related to underlying malignancy and chemotherapy. Discontinued taxol. -Status post thrombolysis -Tolerating Eliquis well, will continue for at least 6 months, possible indefinitely   3. Hypertension and anxiety -Continue medications and f/u with PCP     PLAN: -plan to start Xeloda and radiation on Wednesday 10/25 -lab weekly -f/u every other week as scheduled   No problem-specific Assessment & Plan notes found for this encounter.   SUMMARY OF ONCOLOGIC HISTORY: Oncology History Overview Note   Cancer Staging  Malignant neoplasm of upper-inner quadrant of left breast in female, estrogen receptor positive ([facility] Staging form: Breast, AJCC 8th Edition - Clinical stage from 10/27/2021: Stage IIB (cT1c, cN1, cM0, G3, ER+, PR-, HER2-) - Signed by BAlla Feeling NP on 11/28/2021 - Pathologic stage from 07/10/2022: No Stage Recommended (ypT0, pN1, cM0, G3, ER+, PR-, HER2-) - Unsigned     Malignant neoplasm of upper-inner quadrant of left breast in female, estrogen receptor positive ([place]  10/19/2021 Mammogram   CLINICAL DATA:  Patient presents for palpable left axillary abnormality.   EXAM: DIGITAL DIAGNOSTIC BILATERAL  MAMMOGRAM WITH TOMOSYNTHESIS AND CAD; ULTRASOUND LEFT BREAST LIMITED  IMPRESSION: Suspicious left breast mass 10 o'clock  position.   Adjacent suspicious satellite nodule left breast 12 o'clock position.   Palpable mass left axilla corresponds with a large lymph node.   10/27/2021 Initial Biopsy   Diagnosis 1. Breast, left, needle core biopsy, 12 o'clock, ribbon - FIBROADENOMA - NO MALIGNANCY IDENTIFIED 2. Breast, left, needle core biopsy, 10 o'clock, coil - INVASIVE DUCTAL CARCINOMA WITH NECROSIS - DUCTAL CARCINOMA IN SITU - SEE COMMENT 3. Lymph node, needle/core biopsy, left axilla, tribell - INVASIVE DUCTAL CARCINOMA WITH NECROSIS - NO NODAL TISSUE IDENTIFIED - SEE COMMENT Microscopic Comment 2. and 2. Based on the biopsy, the carcinoma appears Nottingham grade 3 of 3 and measures 0.8 cm in greatest linear extent.  3. PROGNOSTIC INDICATORS Results: The tumor cells are NEGATIVE for Her2 (1+). Estrogen Receptor: 30%, POSITIVE, WEAK STAINING INTENSITY Progesterone Receptor: 0%, NEGATIVE Proliferation Marker Ki67: 60%   10/27/2021 Cancer Staging   Staging form: Breast, AJCC 8th Edition - Clinical stage from 10/27/2021: Stage IIB (cT1c, cN1, cM0, G3, ER+, PR-, HER2-) - Signed by [name], NP on 11/28/2021 Stage prefix: Initial diagnosis Histologic grading system: 3 grade system   11/06/2021 Initial Diagnosis   Malignant neoplasm of upper-inner quadrant of left breast in female, estrogen receptor positive ([place])   11/15/2021 Breast MRI   IMPRESSION: 1.9 cm mass in the upper-inner quadrant of the left breast corresponding with the known invasive ductal carcinoma. Enlarged left axillary lymph node corresponding with known metastatic disease.   RECOMMENDATION: Treatment planning of the known left breast cancer and axillary metastasis is recommended.   Additional imaging evaluation of the mass in the liver is recommended with CT with liver mass protocol or MRI.   11/22/2021 Imaging   Bone scan IMPRESSION: No scintigraphic evidence of bony metastatic disease. Degenerative changes as above.    11/22/2021 Echocardiogram   Baseline echo LVEF 55-60%, normal GLS -21.7%   11/23/2021 Imaging   CT CAP IMPRESSION: 1. Bulky lymphadenopathy in the left axilla including a 5.0 x 2.8 cm lymph node. 2. 15 mm nodule identified in the upper inner quadrant of the left breast. 3. No suspicious pulmonary nodule or mass. No evidence for metastatic disease in the abdomen or pelvis. 4. 11 mm cyst in the dome of the left liver. Adjacent tiny hypoattenuating lesions in the right liver are too small to characterize, but likely benign. Attention on follow-up recommended. 5. Prominent stool volume raises the question of clinical constipation.   11/28/2021 -  Chemotherapy   Patient is on Treatment Plan : BREAST ADJUVANT DOSE DENSE AC q14d / PACLitaxel q7d      Genetic Testing   [facility] CancerNext was Negative. Report date is 11/26/2021.   The CancerNext gene panel offered by [employer] includes sequencing, rearrangement analysis, and RNA analysis for the following 36 genes:   APC, ATM, AXIN2, BARD1, BMPR1A, BRCA1, BRCA2, BRIP1, CDH1, CDK4, CDKN2A, CHEK2, DICER1, HOXB13, EPCAM, GREM1, MLH1, MSH2, MSH3, MSH6, MUTYH, NBN, NF1, NTHL1, PALB2, PMS2, POLD1, POLE, PTEN, RAD51C, RAD51D, RECQL, SMAD4, SMARCA4, STK11, and TP53.    07/10/2022 Definitive Surgery   FINAL MICROSCOPIC DIAGNOSIS:   A. BREAST, LEFT, LUMPECTOMY:  - No residual carcinoma identified  - Treatment effect present (see comment)  - Ductal carcinoma in situ present, ypTis  - Margins negative for ductal carcinoma in situ  - See oncology table   B. BREAST, LEFT ADDITIONAL MEDIAL MARGIN, EXCISION:  - Negative for carcinoma  C. BREAST, LEFT ADDITIONAL LATERAL MARGIN, EXCISION:  - Negative for carcinoma   D. LYMPH NODE, LEFT AXILLARY, SENTINEL, EXCISION:  - Positive for carcinoma (1/1)   E. LYMPH NODE, LEFT AXILLARY, SENTINEL, EXCISION:  - Negative for carcinoma (0/1)   F. LYMPH NODE, LEFT, AXILLARY CONTENTS:  - Two lymph nodes identified  -  Negative for carcinoma (0/2)    ADDENDUM:  Part D: Left axillary sentinel lymph node,   PROGNOSTIC INDICATOR RESULTS:  The tumor cells are NEGATIVE for Her2 (0).  Estrogen Receptor:       POSITIVE, 20%, WEAK STAINING  Progesterone Receptor:   POSITIVE, 2%, MODERATE STAINING       INTERVAL HISTORY:  Cassandra Brennan is here for a follow up of breast cancer. She was last seen by me on 07/23/22. She presents to the clinic alone. She reports she is doing well overall, no new concerns.   All other systems were reviewed with the patient and are negative.  MEDICAL HISTORY:  Past Medical History:  Diagnosis Date   Acute dyspnea    Acute metabolic encephalopathy    Anxiety    Arthritis    right knee    Elevated troponin    Femoral DVT (deep venous thrombosis) (H[facility])    GERD (gastroesophageal reflux disease)    Hyperlipidemia    Hypertension    Lactic acidosis    Obstructive cardiovascular shock ([place]) 03/22/2022   Pneumonia    Positive colorectal cancer screening using Cologuard test    Pulmonary embolism ([place])    03/22/22    SURGICAL HISTORY: Past Surgical History:  Procedure Laterality Date   BREAST CYST EXCISION Right    pt stated in [place] Left 07/10/2022   Procedure: LEFT BREAST LUMPECTOMY WITH RADIOACTIVE SEED AND LEFT  SENTINEL LYMPH NODE MAPPING;  Surgeon: Erroll Luna, MD;  Location: [facility];  Service: General;  Laterality: Left;   BREAST SURGERY     other GI testing     unsure but had to drink a chalky drink    PORTACATH PLACEMENT N/A 11/27/2021   Procedure: INSERTION PORT-A-CATH;  Surgeon: Erroll Luna, MD;  Location: [facility];  Service: General;  Laterality: N/A;   RADIOACTIVE SEED GUIDED AXILLARY SENTINEL LYMPH NODE Left 07/10/2022   Procedure: LEFT AXILLARY SEED LYMPH NODE BIOPSY;  Surgeon: Erroll Luna, MD;  Location: [facility];  Service: General;   Laterality: Left;   TONSILLECTOMY     TUBAL LIGATION      I have reviewed the social history and family history with the patient and they are unchanged from previous note.  ALLERGIES:  is allergic to prednisone, rosuvastatin, other, robitussin (alcohol free) [guaifenesin], and sulfa antibiotics.  MEDICATIONS:  Current Outpatient Medications  Medication Sig Dispense Refill   acetaminophen (TYLENOL) 500 MG tablet Take 500 mg by mouth every 8 (eight) hours as needed for moderate pain.     ALPRAZolam (XANAX) 0.25 MG tablet Take 1 tablet (0.25 mg total) by mouth daily as needed for anxiety. 30 tablet 0   anastrozole (ARIMIDEX) 1 MG tablet Take 1 tablet (1 mg total) by mouth daily. 30 tablet 2   capecitabine (XELODA) 500 MG tablet Take 3 tablets (1,500 mg total) by mouth 2 (two) times daily after a meal. Take on days of radiation, Monday through Friday 99 tablet 1   ELIQUIS 5 MG TABS tablet TAKE 1 TABLET(5 MG) BY MOUTH TWICE DAILY  60 tablet 2   lidocaine-prilocaine (EMLA) cream Apply to affected area once 30 g 3   LISINOPRIL PO Take 1 tablet by mouth daily.     metoprolol succinate (TOPROL-XL) 100 MG 24 hr tablet Take 100 mg by mouth daily. Take with or immediately following a meal.     Multiple Vitamins-Minerals (MULTIVITAMIN ADULTS) TABS Take 1 tablet by mouth daily.     nitrofurantoin, macrocrystal-monohydrate, (MACROBID) 100 MG capsule Take 1 capsule (100 mg total) by mouth 2 (two) times daily. 10 capsule 0   ondansetron (ZOFRAN) 8 MG tablet Take 1 tablet (8 mg total) by mouth 2 (two) times daily as needed. Start on the third day after chemotherapy. (Patient taking differently: Take 8 mg by mouth 2 (two) times daily as needed for nausea or vomiting. Start on the third day after chemotherapy.) 30 tablet 1   oxyCODONE (OXY IR/ROXICODONE) 5 MG immediate release tablet Take 1 tablet (5 mg total) by mouth every 6 (six) hours as needed for severe pain. 15 tablet 0   phenazopyridine (PYRIDIUM) 200 MG  tablet Take 1 tablet (200 mg total) by mouth 3 (three) times daily. 6 tablet 0   pravastatin (PRAVACHOL) 20 MG tablet Take 1 tablet (20 mg total) by mouth daily. 30 tablet 0   prochlorperazine (COMPAZINE) 10 MG tablet Take 1 tablet (10 mg total) by mouth every 6 (six) hours as needed (Nausea or vomiting). 30 tablet 1   No current facility-administered medications for this visit.    PHYSICAL EXAMINATION: ECOG PERFORMANCE STATUS: 1 - Symptomatic but completely ambulatory  Vitals:   08/13/22 0921  BP: 118/77  Pulse: (!) 105  Resp: 18  Temp: 98.3 F (36.8 C)  SpO2: 100%   Wt Readings from Last 3 Encounters:  08/13/22 169 lb 8 oz (76.9 kg)  08/06/22 171 lb (77.6 kg)  07/23/22 175 lb 11.2 oz (79.7 kg)     GENERAL:alert, no distress and comfortable SKIN: skin color normal, no rashes or significant lesions EYES: normal, Conjunctiva are pink and non-injected, sclera clear  NEURO: alert & oriented x 3 with fluent speech  LABORATORY DATA:  I have reviewed the data as listed    Latest Ref Rng & Units 08/13/2022    9:00 AM 07/23/2022   10:00 AM 05/28/2022    9:42 AM  CBC  WBC 4.0 - 10.5 K/uL 7.9  7.3  5.4   Hemoglobin 12.0 - 15.0 g/dL 12.2  11.3  11.5   Hematocrit 36.0 - 46.0 % 35.8  32.8  33.3   Platelets 150 - 400 K/uL 244  288  229         Latest Ref Rng & Units 08/13/2022    9:00 AM 07/23/2022   10:00 AM 05/28/2022    9:42 AM  CMP  Glucose 70 - 99 mg/dL 117  103  100   BUN 8 - 23 mg/dL _0 Creatinine 0.44 - 1.00 mg/dL 1.25  0.71  0.79   Sodium 135 - 145 mmol/L 139  143  141   Potassium 3.5 - 5.1 mmol/L 3.8  4.0  3.9   Chloride 98 - 111 mmol/L 102  107  106   CO2 22 - 32 mmol/L 32  31  31   Calcium 8.9 - 10.3 mg/dL 9.6  9.4  9.6   Total Protein 6.5 - 8.1 g/dL 7.5  6.7  7.6   Total Bilirubin 0.3 - 1.2 mg/dL 0.6  0.4  0.5  Alkaline Phos 38 - 126 U/L 98  97  104   AST 15 - 41 U/L _0 ALT 0 - 44 U/L _1 RADIOGRAPHIC STUDIES: I have  personally reviewed the radiological images as listed and agreed with the findings in the report. No results found.    No orders of the defined types were placed in this encounter.  All questions were answered. The patient knows to call the clinic with any problems, questions or concerns. No barriers to learning was detected. The total time spent in the appointment was 30 minutes.     Truitt Merle, MD 08/13/2022   I, Wilburn Mylar, am acting as scribe for Truitt Merle, MD.   I have reviewed the above documentation for accuracy and completeness, and I agree with the above.

## 2022-08-14 ENCOUNTER — Ambulatory Visit: Payer: Medicare HMO | Admitting: Physical Therapy

## 2022-08-14 ENCOUNTER — Ambulatory Visit: Payer: Medicare HMO | Admitting: Radiation Oncology

## 2022-08-15 ENCOUNTER — Ambulatory Visit: Payer: Medicare HMO

## 2022-08-15 ENCOUNTER — Other Ambulatory Visit: Payer: Self-pay

## 2022-08-15 ENCOUNTER — Ambulatory Visit
Admission: RE | Admit: 2022-08-15 | Discharge: 2022-08-15 | Disposition: A | Discharge status: Home / Self Care | Payer: Medicare HMO | Source: Ambulatory Visit | Attending: Radiation Oncology | Admitting: Radiation Oncology

## 2022-08-15 DIAGNOSIS — C50212 Malignant neoplasm of upper-inner quadrant of left female breast: Secondary | ICD-10-CM

## 2022-08-15 DIAGNOSIS — Z510 Encounter for antineoplastic radiation therapy: Secondary | ICD-10-CM | POA: Diagnosis not present

## 2022-08-15 LAB — RAD ONC ARIA SESSION SUMMARY

## 2022-08-16 ENCOUNTER — Other Ambulatory Visit: Payer: Self-pay

## 2022-08-16 ENCOUNTER — Ambulatory Visit: Payer: Medicare HMO

## 2022-08-16 ENCOUNTER — Ambulatory Visit
Admission: RE | Admit: 2022-08-16 | Discharge: 2022-08-16 | Disposition: A | Discharge status: Home / Self Care | Payer: Medicare HMO | Source: Ambulatory Visit | Attending: Radiation Oncology | Admitting: Radiation Oncology

## 2022-08-16 DIAGNOSIS — Z510 Encounter for antineoplastic radiation therapy: Secondary | ICD-10-CM | POA: Diagnosis not present

## 2022-08-16 DIAGNOSIS — Z483 Aftercare following surgery for neoplasm: Secondary | ICD-10-CM

## 2022-08-16 DIAGNOSIS — C50212 Malignant neoplasm of upper-inner quadrant of left female breast: Secondary | ICD-10-CM | POA: Diagnosis not present

## 2022-08-16 DIAGNOSIS — M25612 Stiffness of left shoulder, not elsewhere classified: Secondary | ICD-10-CM

## 2022-08-16 DIAGNOSIS — R293 Abnormal posture: Secondary | ICD-10-CM

## 2022-08-16 LAB — RAD ONC ARIA SESSION SUMMARY

## 2022-08-16 NOTE — Therapy (Signed)
OUTPATIENT PHYSICAL THERAPY BREAST CANCER TREATMENT   Patient Name: Cassandra Brennan MRN: [id number] DOB:21-Jun-1956, 66 y.o., female Today's Date: 08/16/2022   PT End of Session - 08/16/22 0908     Visit Number 5    Number of Visits 10    Date for PT Re-Evaluation 08/30/22    PT Start Time 0905    PT Stop Time 1003    PT Time Calculation (min) 58 min    Activity Tolerance Patient tolerated treatment well    Behavior During Therapy WFL for tasks assessed/performed              Past Medical History:  Diagnosis Date   Acute dyspnea    Acute metabolic encephalopathy    Anxiety    Arthritis    right knee    Elevated troponin    Femoral DVT (deep venous thrombosis) (HCC)    GERD (gastroesophageal reflux disease)    Hyperlipidemia    Hypertension    Lactic acidosis    Obstructive cardiovascular shock ([place]) 03/22/2022   Pneumonia    Positive colorectal cancer screening using Cologuard test    Pulmonary embolism ([place])    03/22/22   Past Surgical History:  Procedure Laterality Date   BREAST CYST EXCISION Right    pt stated in [place] Left 07/10/2022   Procedure: LEFT BREAST LUMPECTOMY WITH RADIOACTIVE SEED AND LEFT  SENTINEL LYMPH NODE MAPPING;  Surgeon: Erroll Luna, MD;  Location: [facility];  Service: General;  Laterality: Left;   BREAST SURGERY     other GI testing     unsure but had to drink a chalky drink    PORTACATH PLACEMENT N/A 11/27/2021   Procedure: INSERTION PORT-A-CATH;  Surgeon: Erroll Luna, MD;  Location: [facility];  Service: General;  Laterality: N/A;   RADIOACTIVE SEED GUIDED AXILLARY SENTINEL LYMPH NODE Left 07/10/2022   Procedure: LEFT AXILLARY SEED LYMPH NODE BIOPSY;  Surgeon: Erroll Luna, MD;  Location: [facility];  Service: General;  Laterality: Left;   TONSILLECTOMY     TUBAL LIGATION     Patient Active Problem List   Diagnosis Date Noted    Right leg DVT ([place]) 03/25/2022   Pneumonia 03/23/2022   AKI (acute kidney injury) ([place]) 03/23/2022   Acute massive pulmonary embolism ([place]) 03/22/2022   GERD (gastroesophageal reflux disease)    Chronic anemia    Cardiogenic shock ([place])    Anxiety 12/08/2021   Genetic testing 11/30/2021   Port-A-Cath in place 11/28/2021   Malignant neoplasm of upper-inner quadrant of left breast in female, estrogen receptor positive ([place]) 11/06/2021   Hypertension 03/15/2020    PCP: Everardo Beals, NP   REFERRING PROVIDER: Erroll Luna, MD   REFERRING DIAG: Left breast cancer  THERAPY DIAG:  Stiffness of left shoulder, not elsewhere classified  Malignant neoplasm of upper-inner quadrant of left breast in female, estrogen receptor positive ([place])  Aftercare following surgery for neoplasm  Abnormal posture  Rationale for Evaluation and Treatment Rehabilitation  ONSET DATE: 10/19/21  SUBJECTIVE:  SUBJECTIVE STATEMENT: Had my first radiation treatment yesterday and that went well. I didn't have any issues getting into position for it and my shoulder felt fine.   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 It measures 2 cm and is located in the upper inner quadrant. It is weakly ER positive, PR negative and HER2 negative with a Ki67 of 60%. L breast lumpectomy and SLNB on 07/10/22 (1/4), had blood clots in her lung  PATIENT GOALS:  Reassess how my recovery is going related to arm function, pain, and swelling.  PAIN:  Are you having pain? 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 but had to stop because it flares  up her hip   OBJECTIVE:   OBSERVATIONS:  Well healing lumpectomy and SLNB scars thought increased scar tissue palpable  POSTURE:  Forward head and rounded shoulders posture   UPPER EXTREMITY AROM/PROM:   A/PROM Right 11/08/2021 Left 11/08/2021 Left 08/02/22 08/08/2022  Shoulder extension 51 55 68   Shoulder flexion 155 141 131 145  Shoulder abduction 150 135 105 134  Shoulder internal rotation 55 66 46   Shoulder external rotation 84 88 88                           (Blank rows = not tested)    LYMPHEDEMA ASSESSMENTS:    LANDMARK RIGHT 11/08/2021 RIGHT 10.12.23 LEFT 11/08/2021 LEFT 08/02/22  10 cm proximal to olecranon process 32 30.9 32.7 31  Olecranon process 27 27.5 27.'8 27  10 ' cm proximal to ulnar styloid process 24.9 22.8 25.2 23.4  Just proximal to ulnar styloid process 17.8 17.4 17.5 17.1  Across hand at thumb web space 19.8 20.5 21.3 19.9  At base of 2nd digit 6.6 7 7.2 6.6  (Blank rows = not tested)    Surgery type/Date: L lumpectomy and SLNB 07/10/22 Number of lymph nodes removed: 1/4 Current/past treatment (chemo, radiation, hormone therapy): did neo adj chemo had to stop early due to blood clot, then will continue chemo pills and have radiation at the end of this month Other symptoms:  Heaviness/tightness No Pain No Pitting edema No Infections No Decreased scar mobility Yes Stemmer sign No  TODAY'S TREATMENT 08/16/22: Therapeutic Exs Pulleys into flexion and abduction x2 mins each with tactile and VCs to decrease Lt scapular compensation Ball roll up wall into end motions of flexion and Lt abduction x10 each returning therapist demo Modified downward dog on wall 5x, 5 sec holds returning demo Supine scapular series with yellow theraband returning therapist demo for each x5 each and handout issued. Pt struggled with full ROM with D2 due to muscle tightness and weakness Manual Therapy P/ROM to Lt shoulder into flexion, abduction and D2 to pts available end  motions and with scapular depression throughout, VCs to relax due to muscle guarding MFR to Lt axilla and medial upper arm over areas of cording  STM: gently over scar tissue at area of lumpectomy where tissue is firm to touch, this softened some by end of session and area didn't seem as large  08/08/2022  Pulleys flexion and abduction with cueing for initial instruction and for relaxation x 3mn each.   Ball flexion x 10  Seated posterior shoulder circles x10  Standing cane flexion x 10  Standing cane abduction  Sidelying abduction AROM x 10  Supine scapular squeezes   Supine ER stretch   PROM: into flexion, abduction, ER to tolerance, directions and over clothes STM  08/06/22:  Pulleys flexion and abduction with cueing for initial instruction and for relaxation x 46mn each.   Ball flexion x 5  Seated posterior shoulder circles x 5  Seated ER/shoulder retraction x 5   Supine cane flexion x 5  Sidelying abduction AROM x 5  PROM: into flexion, abduction, ER to tolerance, then Rt sidelying scapular mobilizations all directions and over clothes STM  Discussed how PT is available for hip pain and that we could add it to the referral to show her some things to do for the OA.    PATIENT EDUCATION:  Education details: ABC class, scar mobilization, importance of exercise, use compression bra; supine scapular series (08/16/22) Person educated: Patient Education method: Explanation and Handouts Education comprehension: verbalized understanding   HOME EXERCISE PROGRAM:  Reviewed previously given post op HEP. Begin walking or using stationary bike  ASSESSMENT: CLINICAL IMPRESSION: Continued with AA/ROM exercises and included bal roll up wall and modified downward dog on wall. Also progressed HEP to include supine scapular series but advised pt to only do these every other day to begin with and continue to focus on end ROM stretching as her cording does limit her end motions. Pt able to  verbalize good understanding. Also educated pt that we could see her while she is undergoing radiation prn if tightness doesn't improve and as her cording is still limiting her motions.   Pt will benefit from skilled therapeutic intervention to improve on the following deficits: Decreased knowledge of precautions, impaired UE functional use, pain, decreased ROM, postural dysfunction.   PT treatment/interventions: ADL/Self care home management, Therapeutic exercises, Therapeutic activity, Patient/Family education, Self Care, Joint mobilization, Orthotic/Fit training, Manual lymph drainage, Taping, Manual therapy, and Re-evaluation     GOALS: Goals reviewed with patient? Yes  LONG TERM GOALS:  (STG=LTG)  Updated:08/08/2022  GOALS Name Target Date  Goal status  1 Pt will demonstrate she has regained full shoulder ROM and function post operatively compared to baselines.  Baseline: 08/30/2022 MET  2 Pt will demonstrate 141 degrees of left shoulder flexion to allow her to reach overhead. Baseline: 131 08/30/2022 MET  3 Pt will demonstrate 135 degrees of L shoulder abduction to allow her to reach out to the side. Baseline: 105 08/30/2022 MET  4 Pt will be independent in a home exercise program for continued stretching and strengthening. 08/30/2022 IN PROGRESS     PLAN: PT FREQUENCY/DURATION: 2x/wk for 4 wks  PLAN FOR NEXT SESSION: Cont manual therapy focusing on Lt axillary cording and end Lt shoulder P/ROM; review strengthening exercises issue today.  ROtelia Limes PTA 08/16/2022, 10:43 AM   Over Head Pull: Narrow and Wide Grip   Cancer Rehab [phone]   On back, knees bent, feet flat, band across thighs, elbows straight but relaxed. Pull hands apart (start). Keeping elbows straight, bring arms up and over head, hands toward floor. Keep pull steady on band. Hold momentarily. Return slowly, keeping pull steady, back to start. Then do same with a wider grip on the band (past  shoulder width) Repeat _5-10__ times. Band color __yellow____   Side Pull: Double Arm   On back, knees bent, feet flat. Arms perpendicular to body, shoulder level, elbows straight but relaxed. Pull arms out to sides, elbows straight. Resistance band comes across collarbones, hands toward  floor. Hold momentarily. Slowly return to starting position. Repeat _5-10__ times. Band color _yellow____   Sword   On back, knees bent, feet flat, left hand on left hip, right hand above left. Pull right arm DIAGONALLY (hip to shoulder) across chest. Bring right arm along head toward floor. Hold momentarily. Slowly return to starting position. Repeat _5-10__ times. Do with left arm. Band color _yellow_____   Shoulder Rotation: Double Arm   On back, knees bent, feet flat, elbows tucked at sides, bent 90, hands palms up. Pull hands apart and down toward floor, keeping elbows near sides. Hold momentarily. Slowly return to starting position. Repeat _5-10__ times. Band color __yellow____

## 2022-08-16 NOTE — Patient Instructions (Signed)
Over Head Pull: Narrow and Wide Grip   Cancer Rehab [phone]   On back, knees bent, feet flat, band across thighs, elbows straight but relaxed. Pull hands apart (start). Keeping elbows straight, bring arms up and over head, hands toward floor. Keep pull steady on band. Hold momentarily. Return slowly, keeping pull steady, back to start. Then do same with a wider grip on the band (past shoulder width) Repeat _5-10__ times. Band color __yellow____   Side Pull: Double Arm   On back, knees bent, feet flat. Arms perpendicular to body, shoulder level, elbows straight but relaxed. Pull arms out to sides, elbows straight. Resistance band comes across collarbones, hands toward floor. Hold momentarily. Slowly return to starting position. Repeat _5-10__ times. Band color _yellow____   Sword   On back, knees bent, feet flat, left hand on left hip, right hand above left. Pull right arm DIAGONALLY (hip to shoulder) across chest. Bring right arm along head toward floor. Hold momentarily. Slowly return to starting position. Repeat _5-10__ times. Do with left arm. Band color _yellow_____   Shoulder Rotation: Double Arm   On back, knees bent, feet flat, elbows tucked at sides, bent 90, hands palms up. Pull hands apart and down toward floor, keeping elbows near sides. Hold momentarily. Slowly return to starting position. Repeat _5-10__ times. Band color __yellow____    

## 2022-08-17 ENCOUNTER — Other Ambulatory Visit (HOSPITAL_COMMUNITY): Payer: Self-pay

## 2022-08-17 ENCOUNTER — Other Ambulatory Visit: Payer: Self-pay

## 2022-08-17 ENCOUNTER — Ambulatory Visit
Admission: RE | Admit: 2022-08-17 | Discharge: 2022-08-17 | Disposition: A | Discharge status: Home / Self Care | Payer: Medicare HMO | Source: Ambulatory Visit | Attending: Radiation Oncology | Admitting: Radiation Oncology

## 2022-08-17 DIAGNOSIS — Z510 Encounter for antineoplastic radiation therapy: Secondary | ICD-10-CM | POA: Diagnosis not present

## 2022-08-17 LAB — RAD ONC ARIA SESSION SUMMARY

## 2022-08-20 ENCOUNTER — Inpatient Hospital Stay: Payer: Medicare HMO

## 2022-08-20 ENCOUNTER — Other Ambulatory Visit: Payer: Self-pay

## 2022-08-20 ENCOUNTER — Ambulatory Visit
Admission: RE | Admit: 2022-08-20 | Discharge: 2022-08-20 | Disposition: A | Discharge status: Home / Self Care | Payer: Medicare HMO | Source: Ambulatory Visit | Attending: Radiation Oncology | Admitting: Radiation Oncology

## 2022-08-20 DIAGNOSIS — C50212 Malignant neoplasm of upper-inner quadrant of left female breast: Secondary | ICD-10-CM

## 2022-08-20 DIAGNOSIS — Z510 Encounter for antineoplastic radiation therapy: Secondary | ICD-10-CM | POA: Diagnosis not present

## 2022-08-20 LAB — RAD ONC ARIA SESSION SUMMARY

## 2022-08-20 LAB — CBC WITH DIFFERENTIAL (CANCER CENTER ONLY)
Abs Immature Granulocytes: 0.02 10*3/uL (ref 0.00–0.07)
Basophils Absolute: 0 10*3/uL (ref 0.0–0.1)
Basophils Relative: 0 %
Eosinophils Absolute: 0.4 10*3/uL (ref 0.0–0.5)
Eosinophils Relative: 5 %
HCT: 32.9 % — ABNORMAL LOW (ref 36.0–46.0)
Hemoglobin: 11.6 g/dL — ABNORMAL LOW (ref 12.0–15.0)
Immature Granulocytes: 0 %
Lymphocytes Relative: 14 %
Lymphs Abs: 1.1 10*3/uL (ref 0.7–4.0)
MCH: 30.5 pg (ref 26.0–34.0)
MCHC: 35.3 g/dL (ref 30.0–36.0)
MCV: 86.6 fL (ref 80.0–100.0)
Monocytes Absolute: 0.3 10*3/uL (ref 0.1–1.0)
Monocytes Relative: 4 %
Neutro Abs: 6 10*3/uL (ref 1.7–7.7)
Neutrophils Relative %: 77 %
Platelet Count: 230 10*3/uL (ref 150–400)
RBC: 3.8 MIL/uL — ABNORMAL LOW (ref 3.87–5.11)
RDW: 14.4 % (ref 11.5–15.5)
WBC Count: 7.8 10*3/uL (ref 4.0–10.5)
nRBC: 0 % (ref 0.0–0.2)

## 2022-08-21 ENCOUNTER — Ambulatory Visit: Payer: Medicare HMO

## 2022-08-21 ENCOUNTER — Encounter: Payer: Medicare HMO | Admitting: Rehabilitation

## 2022-08-22 ENCOUNTER — Other Ambulatory Visit: Payer: Self-pay

## 2022-08-22 ENCOUNTER — Ambulatory Visit: Payer: Medicare HMO

## 2022-08-22 ENCOUNTER — Ambulatory Visit
Admission: RE | Admit: 2022-08-22 | Discharge: 2022-08-22 | Disposition: A | Discharge status: Home / Self Care | Payer: Medicare HMO | Source: Ambulatory Visit | Attending: Radiation Oncology | Admitting: Radiation Oncology

## 2022-08-22 DIAGNOSIS — Z170 Estrogen receptor positive status [ER+]: Secondary | ICD-10-CM | POA: Insufficient documentation

## 2022-08-22 DIAGNOSIS — I10 Essential (primary) hypertension: Secondary | ICD-10-CM | POA: Diagnosis not present

## 2022-08-22 DIAGNOSIS — Z86718 Personal history of other venous thrombosis and embolism: Secondary | ICD-10-CM | POA: Diagnosis not present

## 2022-08-22 DIAGNOSIS — I82411 Acute embolism and thrombosis of right femoral vein: Secondary | ICD-10-CM | POA: Diagnosis not present

## 2022-08-22 DIAGNOSIS — C50212 Malignant neoplasm of upper-inner quadrant of left female breast: Secondary | ICD-10-CM | POA: Insufficient documentation

## 2022-08-22 DIAGNOSIS — K7689 Other specified diseases of liver: Secondary | ICD-10-CM | POA: Diagnosis not present

## 2022-08-22 DIAGNOSIS — Z7901 Long term (current) use of anticoagulants: Secondary | ICD-10-CM | POA: Diagnosis not present

## 2022-08-22 DIAGNOSIS — Z882 Allergy status to sulfonamides status: Secondary | ICD-10-CM | POA: Diagnosis not present

## 2022-08-22 DIAGNOSIS — Z79899 Other long term (current) drug therapy: Secondary | ICD-10-CM | POA: Diagnosis not present

## 2022-08-22 DIAGNOSIS — Z86711 Personal history of pulmonary embolism: Secondary | ICD-10-CM | POA: Diagnosis not present

## 2022-08-22 DIAGNOSIS — L819 Disorder of pigmentation, unspecified: Secondary | ICD-10-CM | POA: Diagnosis not present

## 2022-08-22 DIAGNOSIS — I2699 Other pulmonary embolism without acute cor pulmonale: Secondary | ICD-10-CM | POA: Diagnosis not present

## 2022-08-22 DIAGNOSIS — Z888 Allergy status to other drugs, medicaments and biological substances status: Secondary | ICD-10-CM | POA: Diagnosis not present

## 2022-08-22 DIAGNOSIS — C773 Secondary and unspecified malignant neoplasm of axilla and upper limb lymph nodes: Secondary | ICD-10-CM | POA: Diagnosis not present

## 2022-08-22 DIAGNOSIS — F419 Anxiety disorder, unspecified: Secondary | ICD-10-CM | POA: Diagnosis not present

## 2022-08-22 DIAGNOSIS — M2550 Pain in unspecified joint: Secondary | ICD-10-CM | POA: Diagnosis not present

## 2022-08-22 LAB — RAD ONC ARIA SESSION SUMMARY
Course Elapsed Days: 7
Plan Fractions Treated to Date: 5
Plan Fractions Treated to Date: 5
Plan Prescribed Dose Per Fraction: 1.8 Gy
Plan Prescribed Dose Per Fraction: 1.8 Gy
Plan Total Fractions Prescribed: 28
Plan Total Fractions Prescribed: 28
Plan Total Prescribed Dose: 50.4 Gy
Plan Total Prescribed Dose: 50.4 Gy
Reference Point Dosage Given to Date: 9 Gy
Reference Point Dosage Given to Date: 9 Gy
Reference Point Session Dosage Given: 1.8 Gy
Reference Point Session Dosage Given: 1.8 Gy
Session Number: 5

## 2022-08-22 MED ORDER — RADIAPLEXRX EX GEL: Freq: Once | CUTANEOUS | Status: AC

## 2022-08-22 MED ORDER — ALRA NON-METALLIC DEODORANT (RAD-ONC)
1.0000 | Freq: Once | TOPICAL | Status: AC
  Administered 2022-08-22: 1 via TOPICAL

## 2022-08-23 ENCOUNTER — Other Ambulatory Visit: Payer: Self-pay

## 2022-08-23 ENCOUNTER — Ambulatory Visit
Admission: RE | Admit: 2022-08-23 | Discharge: 2022-08-23 | Disposition: A | Discharge status: Home / Self Care | Payer: Medicare HMO | Source: Ambulatory Visit | Attending: Radiation Oncology | Admitting: Radiation Oncology

## 2022-08-23 ENCOUNTER — Other Ambulatory Visit (HOSPITAL_COMMUNITY): Payer: Self-pay

## 2022-08-23 DIAGNOSIS — C50212 Malignant neoplasm of upper-inner quadrant of left female breast: Secondary | ICD-10-CM | POA: Diagnosis not present

## 2022-08-23 LAB — RAD ONC ARIA SESSION SUMMARY

## 2022-08-24 ENCOUNTER — Other Ambulatory Visit: Payer: Self-pay

## 2022-08-24 ENCOUNTER — Ambulatory Visit
Admission: RE | Admit: 2022-08-24 | Discharge: 2022-08-24 | Disposition: A | Discharge status: Home / Self Care | Payer: Medicare HMO | Source: Ambulatory Visit | Attending: Radiation Oncology | Admitting: Radiation Oncology

## 2022-08-24 ENCOUNTER — Other Ambulatory Visit (HOSPITAL_COMMUNITY): Payer: Self-pay

## 2022-08-24 DIAGNOSIS — C50212 Malignant neoplasm of upper-inner quadrant of left female breast: Secondary | ICD-10-CM | POA: Diagnosis not present

## 2022-08-24 DIAGNOSIS — Z170 Estrogen receptor positive status [ER+]: Secondary | ICD-10-CM

## 2022-08-24 LAB — RAD ONC ARIA SESSION SUMMARY
Course Elapsed Days: 9
Plan Fractions Treated to Date: 7
Plan Fractions Treated to Date: 7
Plan Prescribed Dose Per Fraction: 1.8 Gy
Plan Prescribed Dose Per Fraction: 1.8 Gy
Plan Total Fractions Prescribed: 28
Plan Total Fractions Prescribed: 28
Plan Total Prescribed Dose: 50.4 Gy
Plan Total Prescribed Dose: 50.4 Gy
Reference Point Dosage Given to Date: 12.6 Gy
Reference Point Dosage Given to Date: 12.6 Gy
Reference Point Session Dosage Given: 1.8 Gy
Reference Point Session Dosage Given: 1.8 Gy
Session Number: 7

## 2022-08-27 ENCOUNTER — Inpatient Hospital Stay (HOSPITAL_BASED_OUTPATIENT_CLINIC_OR_DEPARTMENT_OTHER): Payer: Medicare HMO | Admitting: Hematology

## 2022-08-27 ENCOUNTER — Other Ambulatory Visit: Payer: Self-pay

## 2022-08-27 ENCOUNTER — Encounter: Payer: Self-pay | Admitting: Hematology

## 2022-08-27 ENCOUNTER — Ambulatory Visit
Admission: RE | Admit: 2022-08-27 | Discharge: 2022-08-27 | Disposition: A | Discharge status: Home / Self Care | Payer: Medicare HMO | Source: Ambulatory Visit | Attending: Radiation Oncology | Admitting: Radiation Oncology

## 2022-08-27 ENCOUNTER — Inpatient Hospital Stay: Payer: Medicare HMO | Attending: Hematology

## 2022-08-27 VITALS — BP 125/85 | HR 86 | Temp 98.7°F | Resp 18 | Ht 65.0 in | Wt 172.4 lb

## 2022-08-27 DIAGNOSIS — I10 Essential (primary) hypertension: Secondary | ICD-10-CM | POA: Insufficient documentation

## 2022-08-27 DIAGNOSIS — C773 Secondary and unspecified malignant neoplasm of axilla and upper limb lymph nodes: Secondary | ICD-10-CM | POA: Insufficient documentation

## 2022-08-27 DIAGNOSIS — Z79899 Other long term (current) drug therapy: Secondary | ICD-10-CM | POA: Insufficient documentation

## 2022-08-27 DIAGNOSIS — C50212 Malignant neoplasm of upper-inner quadrant of left female breast: Secondary | ICD-10-CM

## 2022-08-27 DIAGNOSIS — Z170 Estrogen receptor positive status [ER+]: Secondary | ICD-10-CM | POA: Diagnosis not present

## 2022-08-27 DIAGNOSIS — F419 Anxiety disorder, unspecified: Secondary | ICD-10-CM | POA: Insufficient documentation

## 2022-08-27 DIAGNOSIS — Z86718 Personal history of other venous thrombosis and embolism: Secondary | ICD-10-CM | POA: Insufficient documentation

## 2022-08-27 DIAGNOSIS — I2699 Other pulmonary embolism without acute cor pulmonale: Secondary | ICD-10-CM | POA: Insufficient documentation

## 2022-08-27 DIAGNOSIS — L819 Disorder of pigmentation, unspecified: Secondary | ICD-10-CM | POA: Insufficient documentation

## 2022-08-27 DIAGNOSIS — I82411 Acute embolism and thrombosis of right femoral vein: Secondary | ICD-10-CM | POA: Insufficient documentation

## 2022-08-27 DIAGNOSIS — Z7901 Long term (current) use of anticoagulants: Secondary | ICD-10-CM | POA: Insufficient documentation

## 2022-08-27 DIAGNOSIS — Z888 Allergy status to other drugs, medicaments and biological substances status: Secondary | ICD-10-CM | POA: Insufficient documentation

## 2022-08-27 DIAGNOSIS — Z86711 Personal history of pulmonary embolism: Secondary | ICD-10-CM | POA: Insufficient documentation

## 2022-08-27 DIAGNOSIS — K7689 Other specified diseases of liver: Secondary | ICD-10-CM | POA: Insufficient documentation

## 2022-08-27 DIAGNOSIS — Z882 Allergy status to sulfonamides status: Secondary | ICD-10-CM | POA: Insufficient documentation

## 2022-08-27 DIAGNOSIS — M2550 Pain in unspecified joint: Secondary | ICD-10-CM | POA: Insufficient documentation

## 2022-08-27 LAB — CBC WITH DIFFERENTIAL (CANCER CENTER ONLY)
Abs Immature Granulocytes: 0.03 10*3/uL (ref 0.00–0.07)
Basophils Absolute: 0 10*3/uL (ref 0.0–0.1)
Basophils Relative: 0 %
Eosinophils Absolute: 0.4 10*3/uL (ref 0.0–0.5)
Eosinophils Relative: 6 %
HCT: 32.4 % — ABNORMAL LOW (ref 36.0–46.0)
Hemoglobin: 11.4 g/dL — ABNORMAL LOW (ref 12.0–15.0)
Immature Granulocytes: 0 %
Lymphocytes Relative: 14 %
Lymphs Abs: 0.9 10*3/uL (ref 0.7–4.0)
MCH: 30.7 pg (ref 26.0–34.0)
MCHC: 35.2 g/dL (ref 30.0–36.0)
MCV: 87.3 fL (ref 80.0–100.0)
Monocytes Absolute: 0.3 10*3/uL (ref 0.1–1.0)
Monocytes Relative: 4 %
Neutro Abs: 5.2 10*3/uL (ref 1.7–7.7)
Neutrophils Relative %: 76 %
Platelet Count: 262 10*3/uL (ref 150–400)
RBC: 3.71 MIL/uL — ABNORMAL LOW (ref 3.87–5.11)
RDW: 14.9 % (ref 11.5–15.5)
WBC Count: 6.9 10*3/uL (ref 4.0–10.5)
nRBC: 0 % (ref 0.0–0.2)

## 2022-08-27 LAB — RAD ONC ARIA SESSION SUMMARY
Course Elapsed Days: 12
Plan Fractions Treated to Date: 8
Plan Fractions Treated to Date: 8
Plan Prescribed Dose Per Fraction: 1.8 Gy
Plan Prescribed Dose Per Fraction: 1.8 Gy
Plan Total Fractions Prescribed: 28
Plan Total Fractions Prescribed: 28
Plan Total Prescribed Dose: 50.4 Gy
Plan Total Prescribed Dose: 50.4 Gy
Reference Point Dosage Given to Date: 14.4 Gy
Reference Point Dosage Given to Date: 14.4 Gy
Reference Point Session Dosage Given: 1.8 Gy
Reference Point Session Dosage Given: 1.8 Gy
Session Number: 8

## 2022-08-27 LAB — CMP (CANCER CENTER ONLY)
ALT: 21 U/L (ref 0–44)
AST: 13 U/L — ABNORMAL LOW (ref 15–41)
Albumin: 4.2 g/dL (ref 3.5–5.0)
Alkaline Phosphatase: 91 U/L (ref 38–126)
Anion gap: 6 (ref 5–15)
BUN: 15 mg/dL (ref 8–23)
CO2: 31 mmol/L (ref 22–32)
Calcium: 9.5 mg/dL (ref 8.9–10.3)
Chloride: 104 mmol/L (ref 98–111)
Creatinine: 0.88 mg/dL (ref 0.44–1.00)
GFR, Estimated: >60 mL/min (ref >60)
Glucose, Bld: 94 mg/dL (ref 70–99)
Potassium: 3.6 mmol/L (ref 3.5–5.1)
Sodium: 141 mmol/L (ref 135–145)
Total Bilirubin: 0.5 mg/dL (ref 0.3–1.2)
Total Protein: 7.1 g/dL (ref 6.5–8.1)

## 2022-08-27 LAB — SAMPLE TO BLOOD BANK

## 2022-08-27 MED ORDER — APIXABAN 5 MG PO TABS: ORAL_TABLET | ORAL | 3 refills | Status: DC

## 2022-08-27 NOTE — Progress Notes (Signed)
Cassandra Brennan   [phone]) [phone] Fax:([phone]) [phone]   Clinic Follow up Note   Patient Care Team: Everardo Beals, NP as PCP - General Erroll Luna, MD as Consulting Physician (General Surgery) Truitt Merle, MD as Consulting Physician (Hematology) Gery Pray, MD as Consulting Physician (Radiation Oncology) [name], RN as Oncology Nurse Navigator [name], RN as Oncology Nurse Navigator  Date of Service:  08/27/2022  CHIEF COMPLAINT: f/u of left breast cancer  CURRENT THERAPY:  Concurrent chemoRT with Xeloda, starting 08/15/22             -Xeloda dose: 1517m BID M-F  ASSESSMENT & PLAN:  Cassandra LABARBERAis a 66y.o. post-menopausal female with   1. Malignant neoplasm of upper-inner quadrant of left breast, IDC, Stage IIB, c(T1c, N1), yp(T0, N1) ER 30% weak+, PR-/HER2-, Grade 3 -presented with palpable left breast and axillary masses. Biopsy 10/27/21 confirmed invasive ductal carcinoma with necrosis to both 10 o'clock and lymph node, grade 3. ER weakly positive (30%). -breast MRI 11/15/21 shows the biopsy proven malignancy in the upper inner left breat measuring 1.9 cm, a biopsy proven malignant 4.3 cm left axillary LN and 2 other abnormal LNs measuring 1.3 and 1.5 cm, and a 1.3 liver mass likely representing a cyst.  -CT CAP 11/23/21 shows known left breast cancer and bulky axillary adenopathy, a liver cyst, and tiny indeterminate liver nodules too small to characterize. We will monitor these in the future. No evidence of distant metastasis.  -Bone scan is negative for osseous metastasis.  -Baseline echo on 11/22/21 is normal, LVEF 55-60%. -s/p 4 cycles of neoadjuvant AC 09/27/21 - 01/08/22, tolerated moderate well with fatigue and nausea. Breast mass and adenopathy no longer palpable. -she received weekly taxol 01/22/22 - 03/20/22, discontinued due to hospitalization for massive PE and cardiogenic shock.  -she took anastrozole 04/17/22 - 06/22/22 due to delay  in surgery from hospitalization, discontinued due to arthralgias. -s/p left lumpectomy on 07/10/22 under Dr. CBrantley Stage path showed complete pathologic response in breast but 1/4 positive lymph nodes. Repeat prognostic panel on the positive node showed ER 20% weak, PR 2% moderate, and Her2 negative. -she began concurrent chemoRT with Xeloda on 08/15/22. Plan to continue Xeloda for 6 months. -she is tolerating chemoRT well so far, no noticeable side effects from Xeloda. Labs reviewed, hgb overall stable at 11.4. Will continue at same dose.   2. Massive B/L PE -s/p C9 taxol, she experienced fatigue, tachycardia, and dyspnea, admitted 03/22/22. CT showed large bilateral pulmonary emboli with right heart strain. -previously discussed this is likely related to underlying malignancy and chemotherapy. Discontinued taxol. -Status post thrombolysis -Tolerating Eliquis well, will continue for at least 6 months, possible indefinitely.    3. Hypertension and anxiety -Continue medications and f/u with PCP -she tells me she was put on a new BP medicine, which has been helping. (BP has been WNL since 06/2022.)     PLAN: -continue daily radiation and Xeloda at same dose -lab weekly -f/u every other week as scheduled   No problem-specific Assessment & Plan notes found for this encounter.   SUMMARY OF ONCOLOGIC HISTORY: Oncology History Overview Note   Cancer Staging  Malignant neoplasm of upper-inner quadrant of left breast in female, estrogen receptor positive ([place] Staging form: Breast, AJCC 8th Edition - Clinical stage from 10/27/2021: Stage IIB (cT1c, cN1, cM0, G3, ER+, PR-, HER2-) - Signed by BAlla Feeling NP on 11/28/2021 - Pathologic stage from 07/10/2022: No Stage Recommended (ypT0,  pN1, cM0, G3, ER+, PR-, HER2-) - Unsigned     Malignant neoplasm of upper-inner quadrant of left breast in female, estrogen receptor positive ([place])  10/19/2021 Mammogram   CLINICAL DATA:  Patient presents for palpable  left axillary abnormality.   EXAM: DIGITAL DIAGNOSTIC BILATERAL MAMMOGRAM WITH TOMOSYNTHESIS AND CAD; ULTRASOUND LEFT BREAST LIMITED  IMPRESSION: Suspicious left breast mass 10 o'clock position.   Adjacent suspicious satellite nodule left breast 12 o'clock position.   Palpable mass left axilla corresponds with a large lymph node.   10/27/2021 Initial Biopsy   Diagnosis 1. Breast, left, needle core biopsy, 12 o'clock, ribbon - FIBROADENOMA - NO MALIGNANCY IDENTIFIED 2. Breast, left, needle core biopsy, 10 o'clock, coil - INVASIVE DUCTAL CARCINOMA WITH NECROSIS - DUCTAL CARCINOMA IN SITU - SEE COMMENT 3. Lymph node, needle/core biopsy, left axilla, tribell - INVASIVE DUCTAL CARCINOMA WITH NECROSIS - NO NODAL TISSUE IDENTIFIED - SEE COMMENT Microscopic Comment 2. and 2. Based on the biopsy, the carcinoma appears Nottingham grade 3 of 3 and measures 0.8 cm in greatest linear extent.  3. PROGNOSTIC INDICATORS Results: The tumor cells are NEGATIVE for Her2 (1+). Estrogen Receptor: 30%, POSITIVE, WEAK STAINING INTENSITY Progesterone Receptor: 0%, NEGATIVE Proliferation Marker Ki67: 60%   10/27/2021 Cancer Staging   Staging form: Breast, AJCC 8th Edition - Clinical stage from 10/27/2021: Stage IIB (cT1c, cN1, cM0, G3, ER+, PR-, HER2-) - Signed by [name], NP on 11/28/2021 Stage prefix: Initial diagnosis Histologic grading system: 3 grade system   11/06/2021 Initial Diagnosis   Malignant neoplasm of upper-inner quadrant of left breast in female, estrogen receptor positive ([place])   11/15/2021 Breast MRI   IMPRESSION: 1.9 cm mass in the upper-inner quadrant of the left breast corresponding with the known invasive ductal carcinoma. Enlarged left axillary lymph node corresponding with known metastatic disease.   RECOMMENDATION: Treatment planning of the known left breast cancer and axillary metastasis is recommended.   Additional imaging evaluation of the mass in the liver is  recommended with CT with liver mass protocol or MRI.   11/22/2021 Imaging   Bone scan IMPRESSION: No scintigraphic evidence of bony metastatic disease. Degenerative changes as above.   11/22/2021 Echocardiogram   Baseline echo LVEF 55-60%, normal GLS -21.7%   11/23/2021 Imaging   CT CAP IMPRESSION: 1. Bulky lymphadenopathy in the left axilla including a 5.0 x 2.8 cm lymph node. 2. 15 mm nodule identified in the upper inner quadrant of the left breast. 3. No suspicious pulmonary nodule or mass. No evidence for metastatic disease in the abdomen or pelvis. 4. 11 mm cyst in the dome of the left liver. Adjacent tiny hypoattenuating lesions in the right liver are too small to characterize, but likely benign. Attention on follow-up recommended. 5. Prominent stool volume raises the question of clinical constipation.   11/28/2021 - 03/20/2022 Chemotherapy   Patient is on Treatment Plan : BREAST ADJUVANT DOSE DENSE AC q14d / PACLitaxel q7d      Genetic Testing   [facility] CancerNext was Negative. Report date is 11/26/2021.   The CancerNext gene panel offered by [employer] includes sequencing, rearrangement analysis, and RNA analysis for the following 36 genes:   APC, ATM, AXIN2, BARD1, BMPR1A, BRCA1, BRCA2, BRIP1, CDH1, CDK4, CDKN2A, CHEK2, DICER1, HOXB13, EPCAM, GREM1, MLH1, MSH2, MSH3, MSH6, MUTYH, NBN, NF1, NTHL1, PALB2, PMS2, POLD1, POLE, PTEN, RAD51C, RAD51D, RECQL, SMAD4, SMARCA4, STK11, and TP53.    07/10/2022 Definitive Surgery   FINAL MICROSCOPIC DIAGNOSIS:   A. BREAST, LEFT, LUMPECTOMY:  -  No residual carcinoma identified  - Treatment effect present (see comment)  - Ductal carcinoma in situ present, ypTis  - Margins negative for ductal carcinoma in situ  - See oncology table   B. BREAST, LEFT ADDITIONAL MEDIAL MARGIN, EXCISION:  - Negative for carcinoma   C. BREAST, LEFT ADDITIONAL LATERAL MARGIN, EXCISION:  - Negative for carcinoma   D. LYMPH NODE, LEFT AXILLARY, SENTINEL, EXCISION:  -  Positive for carcinoma (1/1)   E. LYMPH NODE, LEFT AXILLARY, SENTINEL, EXCISION:  - Negative for carcinoma (0/1)   F. LYMPH NODE, LEFT, AXILLARY CONTENTS:  - Two lymph nodes identified  - Negative for carcinoma (0/2)    ADDENDUM:  Part D: Left axillary sentinel lymph node,   PROGNOSTIC INDICATOR RESULTS:  The tumor cells are NEGATIVE for Her2 (0).  Estrogen Receptor:       POSITIVE, 20%, WEAK STAINING  Progesterone Receptor:   POSITIVE, 2%, MODERATE STAINING       INTERVAL HISTORY:  Cassandra Brennan is here for a follow up of breast cancer. She was last seen by me on 08/13/22. She presents to the clinic alone. She reports she is doing well overall. She denies any noticeable side effects from Xeloda at this point.   All other systems were reviewed with the patient and are negative.  MEDICAL HISTORY:  Past Medical History:  Diagnosis Date   Acute dyspnea    Acute metabolic encephalopathy    Anxiety    Arthritis    right knee    Elevated troponin    Femoral DVT (deep venous thrombosis) (H[facility])    GERD (gastroesophageal reflux disease)    Hyperlipidemia    Hypertension    Lactic acidosis    Obstructive cardiovascular shock ([place]) 03/22/2022   Pneumonia    Positive colorectal cancer screening using Cologuard test    Pulmonary embolism ([place])    03/22/22    SURGICAL HISTORY: Past Surgical History:  Procedure Laterality Date   BREAST CYST EXCISION Right    pt stated in [place] Left 07/10/2022   Procedure: LEFT BREAST LUMPECTOMY WITH RADIOACTIVE SEED AND LEFT  SENTINEL LYMPH NODE MAPPING;  Surgeon: Erroll Luna, MD;  Location: [facility];  Service: General;  Laterality: Left;   BREAST SURGERY     other GI testing     unsure but had to drink a chalky drink    PORTACATH PLACEMENT N/A 11/27/2021   Procedure: INSERTION PORT-A-CATH;  Surgeon: Erroll Luna, MD;  Location: [facility];  Service:  General;  Laterality: N/A;   RADIOACTIVE SEED GUIDED AXILLARY SENTINEL LYMPH NODE Left 07/10/2022   Procedure: LEFT AXILLARY SEED LYMPH NODE BIOPSY;  Surgeon: Erroll Luna, MD;  Location: [facility];  Service: General;  Laterality: Left;   TONSILLECTOMY     TUBAL LIGATION      I have reviewed the social history and family history with the patient and they are unchanged from previous note.  ALLERGIES:  is allergic to prednisone, rosuvastatin, other, robitussin (alcohol free) [guaifenesin], and sulfa antibiotics.  MEDICATIONS:  Current Outpatient Medications  Medication Sig Dispense Refill   acetaminophen (TYLENOL) 500 MG tablet Take 500 mg by mouth every 8 (eight) hours as needed for moderate pain.     ALPRAZolam (XANAX) 0.25 MG tablet Take 1 tablet (0.25 mg total) by mouth daily as needed for anxiety. 30 tablet 0   apixaban (ELIQUIS) 5 MG TABS tablet TAKE 1  TABLET(5 MG) BY MOUTH TWICE DAILY 60 tablet 3   capecitabine (XELODA) 500 MG tablet Take 3 tablets (1,500 mg total) by mouth 2 (two) times daily after a meal. Take on days of radiation, Monday through Friday 99 tablet 1   lidocaine-prilocaine (EMLA) cream Apply to affected area once 30 g 3   LISINOPRIL PO Take 1 tablet by mouth daily.     metoprolol succinate (TOPROL-XL) 100 MG 24 hr tablet Take 100 mg by mouth daily. Take with or immediately following a meal.     Multiple Vitamins-Minerals (MULTIVITAMIN ADULTS) TABS Take 1 tablet by mouth daily.     nitrofurantoin, macrocrystal-monohydrate, (MACROBID) 100 MG capsule Take 1 capsule (100 mg total) by mouth 2 (two) times daily. 10 capsule 0   ondansetron (ZOFRAN) 8 MG tablet Take 1 tablet (8 mg total) by mouth 2 (two) times daily as needed. Start on the third day after chemotherapy. (Patient taking differently: Take 8 mg by mouth 2 (two) times daily as needed for nausea or vomiting. Start on the third day after chemotherapy.) 30 tablet 1   oxyCODONE (OXY IR/ROXICODONE) 5 MG  immediate release tablet Take 1 tablet (5 mg total) by mouth every 6 (six) hours as needed for severe pain. 15 tablet 0   phenazopyridine (PYRIDIUM) 200 MG tablet Take 1 tablet (200 mg total) by mouth 3 (three) times daily. 6 tablet 0   pravastatin (PRAVACHOL) 20 MG tablet Take 1 tablet (20 mg total) by mouth daily. 30 tablet 0   prochlorperazine (COMPAZINE) 10 MG tablet Take 1 tablet (10 mg total) by mouth every 6 (six) hours as needed (Nausea or vomiting). 30 tablet 1   No current facility-administered medications for this visit.    PHYSICAL EXAMINATION: ECOG PERFORMANCE STATUS: 0 - Asymptomatic  Vitals:   08/27/22 0946  BP: 125/85  Pulse: 86  Resp: 18  Temp: 98.7 F (37.1 C)  SpO2: 100%   Wt Readings from Last 3 Encounters:  08/27/22 172 lb 6.4 oz (78.2 kg)  08/13/22 169 lb 8 oz (76.9 kg)  08/06/22 171 lb (77.6 kg)     GENERAL:alert, no distress and comfortable SKIN: skin color normal, no rashes or significant lesions EYES: normal, Conjunctiva are pink and non-injected, sclera clear  NEURO: alert & oriented x 3 with fluent speech  LABORATORY DATA:  I have reviewed the data as listed    Latest Ref Rng & Units 08/27/2022    9:07 AM 08/20/2022    9:02 AM 08/13/2022    9:00 AM  CBC  WBC 4.0 - 10.5 K/uL 6.9  7.8  7.9   Hemoglobin 12.0 - 15.0 g/dL 11.4  11.6  12.2   Hematocrit 36.0 - 46.0 % 32.4  32.9  35.8   Platelets 150 - 400 K/uL 262  230  244         Latest Ref Rng & Units 08/27/2022    9:07 AM 08/13/2022    9:00 AM 07/23/2022   10:00 AM  CMP  Glucose 70 - 99 mg/dL 94  117  103   BUN 8 - 23 mg/dL _0 Creatinine 0.44 - 1.00 mg/dL 0.88  1.25  0.71   Sodium 135 - 145 mmol/L 141  139  143   Potassium 3.5 - 5.1 mmol/L 3.6  3.8  4.0   Chloride 98 - 111 mmol/L 104  102  107   CO2 22 - 32 mmol/L 31  32  31   Calcium  8.9 - 10.3 mg/dL 9.5  9.6  9.4   Total Protein 6.5 - 8.1 g/dL 7.1  7.5  6.7   Total Bilirubin 0.3 - 1.2 mg/dL 0.5  0.6  0.4   Alkaline Phos  38 - 126 U/L 91  98  97   AST 15 - 41 U/L _0 ALT 0 - 44 U/L _1 RADIOGRAPHIC STUDIES: I have personally reviewed the radiological images as listed and agreed with the findings in the report. No results found.    No orders of the defined types were placed in this encounter.  All questions were answered. The patient knows to call the clinic with any problems, questions or concerns. No barriers to learning was detected. The total time spent in the appointment was 30 minutes.     Truitt Merle, MD 08/27/2022   I, Wilburn Mylar, am acting as scribe for Truitt Merle, MD.   I have reviewed the above documentation for accuracy and completeness, and I agree with the above.

## 2022-08-28 ENCOUNTER — Encounter: Payer: Medicare HMO | Admitting: Physical Therapy

## 2022-08-28 ENCOUNTER — Ambulatory Visit
Admission: RE | Admit: 2022-08-28 | Discharge: 2022-08-28 | Disposition: A | Discharge status: Home / Self Care | Payer: Medicare HMO | Source: Ambulatory Visit | Attending: Radiation Oncology | Admitting: Radiation Oncology

## 2022-08-28 ENCOUNTER — Other Ambulatory Visit: Payer: Self-pay

## 2022-08-28 ENCOUNTER — Telehealth: Payer: Self-pay | Admitting: Hematology

## 2022-08-28 ENCOUNTER — Ambulatory Visit: Payer: Medicare HMO

## 2022-08-28 DIAGNOSIS — C50212 Malignant neoplasm of upper-inner quadrant of left female breast: Secondary | ICD-10-CM | POA: Diagnosis not present

## 2022-08-28 LAB — RAD ONC ARIA SESSION SUMMARY
Course Elapsed Days: 13
Plan Fractions Treated to Date: 9
Plan Fractions Treated to Date: 9
Plan Prescribed Dose Per Fraction: 1.8 Gy
Plan Prescribed Dose Per Fraction: 1.8 Gy
Plan Total Fractions Prescribed: 28
Plan Total Fractions Prescribed: 28
Plan Total Prescribed Dose: 50.4 Gy
Plan Total Prescribed Dose: 50.4 Gy
Reference Point Dosage Given to Date: 16.2 Gy
Reference Point Dosage Given to Date: 16.2 Gy
Reference Point Session Dosage Given: 1.8 Gy
Reference Point Session Dosage Given: 1.8 Gy
Session Number: 9

## 2022-08-28 NOTE — Telephone Encounter (Signed)
Called patient regarding upcoming appointments. Left voicemail.

## 2022-08-29 ENCOUNTER — Ambulatory Visit
Admission: RE | Admit: 2022-08-29 | Discharge: 2022-08-29 | Disposition: A | Discharge status: Home / Self Care | Payer: Medicare HMO | Source: Ambulatory Visit | Attending: Radiation Oncology | Admitting: Radiation Oncology

## 2022-08-29 ENCOUNTER — Other Ambulatory Visit: Payer: Self-pay

## 2022-08-29 DIAGNOSIS — C50212 Malignant neoplasm of upper-inner quadrant of left female breast: Secondary | ICD-10-CM | POA: Diagnosis not present

## 2022-08-29 LAB — RAD ONC ARIA SESSION SUMMARY
Course Elapsed Days: 14
Plan Fractions Treated to Date: 10
Plan Fractions Treated to Date: 10
Plan Prescribed Dose Per Fraction: 1.8 Gy
Plan Prescribed Dose Per Fraction: 1.8 Gy
Plan Total Fractions Prescribed: 28
Plan Total Fractions Prescribed: 28
Plan Total Prescribed Dose: 50.4 Gy
Plan Total Prescribed Dose: 50.4 Gy
Reference Point Dosage Given to Date: 18 Gy
Reference Point Dosage Given to Date: 18 Gy
Reference Point Session Dosage Given: 1.8 Gy
Reference Point Session Dosage Given: 1.8 Gy
Session Number: 10

## 2022-08-30 ENCOUNTER — Other Ambulatory Visit: Payer: Self-pay

## 2022-08-30 ENCOUNTER — Encounter: Payer: Medicare HMO | Admitting: Physical Therapy

## 2022-08-30 ENCOUNTER — Ambulatory Visit
Admission: RE | Admit: 2022-08-30 | Discharge: 2022-08-30 | Disposition: A | Discharge status: Home / Self Care | Payer: Medicare HMO | Source: Ambulatory Visit | Attending: Radiation Oncology | Admitting: Radiation Oncology

## 2022-08-30 DIAGNOSIS — C50212 Malignant neoplasm of upper-inner quadrant of left female breast: Secondary | ICD-10-CM | POA: Diagnosis not present

## 2022-08-30 LAB — RAD ONC ARIA SESSION SUMMARY

## 2022-08-31 ENCOUNTER — Ambulatory Visit
Admission: RE | Admit: 2022-08-31 | Discharge: 2022-08-31 | Disposition: A | Discharge status: Home / Self Care | Payer: Medicare HMO | Source: Ambulatory Visit | Attending: Radiation Oncology | Admitting: Radiation Oncology

## 2022-08-31 ENCOUNTER — Other Ambulatory Visit: Payer: Self-pay

## 2022-08-31 DIAGNOSIS — C50212 Malignant neoplasm of upper-inner quadrant of left female breast: Secondary | ICD-10-CM | POA: Diagnosis not present

## 2022-08-31 LAB — RAD ONC ARIA SESSION SUMMARY
Course Elapsed Days: 16
Plan Fractions Treated to Date: 12
Plan Fractions Treated to Date: 12
Plan Prescribed Dose Per Fraction: 1.8 Gy
Plan Prescribed Dose Per Fraction: 1.8 Gy
Plan Total Fractions Prescribed: 28
Plan Total Fractions Prescribed: 28
Plan Total Prescribed Dose: 50.4 Gy
Plan Total Prescribed Dose: 50.4 Gy
Reference Point Dosage Given to Date: 21.6 Gy
Reference Point Dosage Given to Date: 21.6 Gy
Reference Point Session Dosage Given: 1.8 Gy
Reference Point Session Dosage Given: 1.8 Gy
Session Number: 12

## 2022-09-03 ENCOUNTER — Other Ambulatory Visit: Payer: Self-pay

## 2022-09-03 ENCOUNTER — Ambulatory Visit
Admission: RE | Admit: 2022-09-03 | Discharge: 2022-09-03 | Disposition: A | Discharge status: Home / Self Care | Payer: Medicare HMO | Source: Ambulatory Visit | Attending: Radiation Oncology | Admitting: Radiation Oncology

## 2022-09-03 ENCOUNTER — Other Ambulatory Visit (HOSPITAL_COMMUNITY): Payer: Self-pay

## 2022-09-03 ENCOUNTER — Inpatient Hospital Stay: Payer: Medicare HMO

## 2022-09-03 ENCOUNTER — Ambulatory Visit: Payer: Medicare HMO | Attending: Surgery

## 2022-09-03 DIAGNOSIS — Z483 Aftercare following surgery for neoplasm: Secondary | ICD-10-CM | POA: Insufficient documentation

## 2022-09-03 DIAGNOSIS — Z170 Estrogen receptor positive status [ER+]: Secondary | ICD-10-CM | POA: Diagnosis present

## 2022-09-03 DIAGNOSIS — C50212 Malignant neoplasm of upper-inner quadrant of left female breast: Secondary | ICD-10-CM | POA: Diagnosis present

## 2022-09-03 DIAGNOSIS — M25612 Stiffness of left shoulder, not elsewhere classified: Secondary | ICD-10-CM | POA: Diagnosis present

## 2022-09-03 DIAGNOSIS — R293 Abnormal posture: Secondary | ICD-10-CM | POA: Diagnosis present

## 2022-09-03 LAB — SAMPLE TO BLOOD BANK

## 2022-09-03 LAB — CBC WITH DIFFERENTIAL (CANCER CENTER ONLY)
Abs Immature Granulocytes: 0.03 10*3/uL (ref 0.00–0.07)
Basophils Absolute: 0 10*3/uL (ref 0.0–0.1)
Basophils Relative: 0 %
Eosinophils Absolute: 0.6 10*3/uL — ABNORMAL HIGH (ref 0.0–0.5)
Eosinophils Relative: 8 %
HCT: 32.4 % — ABNORMAL LOW (ref 36.0–46.0)
Hemoglobin: 11.3 g/dL — ABNORMAL LOW (ref 12.0–15.0)
Immature Granulocytes: 0 %
Lymphocytes Relative: 11 %
Lymphs Abs: 0.9 10*3/uL (ref 0.7–4.0)
MCH: 31.1 pg (ref 26.0–34.0)
MCHC: 34.9 g/dL (ref 30.0–36.0)
MCV: 89.3 fL (ref 80.0–100.0)
Monocytes Absolute: 0.4 10*3/uL (ref 0.1–1.0)
Monocytes Relative: 5 %
Neutro Abs: 6.1 10*3/uL (ref 1.7–7.7)
Neutrophils Relative %: 76 %
Platelet Count: 263 10*3/uL (ref 150–400)
RBC: 3.63 MIL/uL — ABNORMAL LOW (ref 3.87–5.11)
RDW: 15.7 % — ABNORMAL HIGH (ref 11.5–15.5)
WBC Count: 8 10*3/uL (ref 4.0–10.5)
nRBC: 0 % (ref 0.0–0.2)

## 2022-09-03 LAB — CMP (CANCER CENTER ONLY)
ALT: 16 U/L (ref 0–44)
AST: 12 U/L — ABNORMAL LOW (ref 15–41)
Albumin: 4.5 g/dL (ref 3.5–5.0)
Alkaline Phosphatase: 88 U/L (ref 38–126)
Anion gap: 6 (ref 5–15)
BUN: 19 mg/dL (ref 8–23)
CO2: 33 mmol/L — ABNORMAL HIGH (ref 22–32)
Calcium: 9.5 mg/dL (ref 8.9–10.3)
Chloride: 104 mmol/L (ref 98–111)
Creatinine: 1 mg/dL (ref 0.44–1.00)
GFR, Estimated: >60 mL/min (ref >60)
Glucose, Bld: 99 mg/dL (ref 70–99)
Potassium: 3.4 mmol/L — ABNORMAL LOW (ref 3.5–5.1)
Sodium: 143 mmol/L (ref 135–145)
Total Bilirubin: 0.5 mg/dL (ref 0.3–1.2)
Total Protein: 7.5 g/dL (ref 6.5–8.1)

## 2022-09-03 LAB — RAD ONC ARIA SESSION SUMMARY
Course Elapsed Days: 19
Plan Fractions Treated to Date: 13
Plan Fractions Treated to Date: 13
Plan Prescribed Dose Per Fraction: 1.8 Gy
Plan Prescribed Dose Per Fraction: 1.8 Gy
Plan Total Fractions Prescribed: 28
Plan Total Fractions Prescribed: 28
Plan Total Prescribed Dose: 50.4 Gy
Plan Total Prescribed Dose: 50.4 Gy
Reference Point Dosage Given to Date: 23.4 Gy
Reference Point Dosage Given to Date: 23.4 Gy
Reference Point Session Dosage Given: 1.8 Gy
Reference Point Session Dosage Given: 1.8 Gy
Session Number: 13

## 2022-09-03 NOTE — Therapy (Signed)
OUTPATIENT PHYSICAL THERAPY BREAST CANCER TREATMENT   Patient Name: Cassandra Brennan MRN: [id number] DOB:03-06-1956, 66 y.o., female Today's Date: 09/03/2022   PT End of Session - 09/03/22 1406     Visit Number 6    Number of Visits 10    Date for PT Re-Evaluation 10/01/22    PT Start Time 1404    PT Stop Time 1502    PT Time Calculation (min) 58 min    Activity Tolerance Patient tolerated treatment well    Behavior During Therapy WFL for tasks assessed/performed              Past Medical History:  Diagnosis Date   Acute dyspnea    Acute metabolic encephalopathy    Anxiety    Arthritis    right knee    Elevated troponin    Femoral DVT (deep venous thrombosis) (HCC)    GERD (gastroesophageal reflux disease)    Hyperlipidemia    Hypertension    Lactic acidosis    Obstructive cardiovascular shock ([place]) 03/22/2022   Pneumonia    Positive colorectal cancer screening using Cologuard test    Pulmonary embolism ([place])    03/22/22   Past Surgical History:  Procedure Laterality Date   BREAST CYST EXCISION Right    pt stated in [place] Left 07/10/2022   Procedure: LEFT BREAST LUMPECTOMY WITH RADIOACTIVE SEED AND LEFT  SENTINEL LYMPH NODE MAPPING;  Surgeon: Erroll Luna, MD;  Location: [facility];  Service: General;  Laterality: Left;   BREAST SURGERY     other GI testing     unsure but had to drink a chalky drink    PORTACATH PLACEMENT N/A 11/27/2021   Procedure: INSERTION PORT-A-CATH;  Surgeon: Erroll Luna, MD;  Location[facility];  Service: General;  Laterality: N/A;   RADIOACTIVE SEED GUIDED AXILLARY SENTINEL LYMPH NODE Left 07/10/2022   Procedure: LEFT AXILLARY SEED LYMPH NODE BIOPSY;  Surgeon: Erroll Luna, MD;  Location: [facility];  Service: General;  Laterality: Left;   TONSILLECTOMY     TUBAL LIGATION     Patient Active Problem List   Diagnosis Date Noted    Right leg DVT ([place]) 03/25/2022   Pneumonia 03/23/2022   AKI (acute kidney injury) ([place]) 03/23/2022   Acute massive pulmonary embolism ([place]) 03/22/2022   GERD (gastroesophageal reflux disease)    Chronic anemia    Cardiogenic shock ([place])    Anxiety 12/08/2021   Genetic testing 11/30/2021   Port-A-Cath in place 11/28/2021   Malignant neoplasm of upper-inner quadrant of left breast in female, estrogen receptor positive ([place]) 11/06/2021   Hypertension 03/15/2020    PCP: Everardo Beals, NP   REFERRING PROVIDER: Erroll Luna, MD   REFERRING DIAG: Left breast cancer  THERAPY DIAG:  Stiffness of left shoulder, not elsewhere classified  Malignant neoplasm of upper-inner quadrant of left breast in female, estrogen receptor positive [facility])  Aftercare following surgery for neoplasm  Rationale for Evaluation and Treatment Rehabilitation  ONSET DATE: 10/19/21  SUBJECTIVE:  SUBJECTIVE STATEMENT: My skin is doing okay as I'm about 3 weeks into radiation. I can feel though that my axilla has gotten tighter over the past week. I'd like to cont physical therapy to help with that tightness.   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 It measures 2 cm and is located in the upper inner quadrant. It is weakly ER positive, PR negative and HER2 negative with a Ki67 of 60%. L breast lumpectomy and SLNB on 07/10/22 (1/4), had blood clots in her lung  PATIENT GOALS:  Reassess how my recovery is going related to arm function, pain, and swelling.  PAIN:  Are you having pain? 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 but  had to stop because it flares up her hip   OBJECTIVE:   OBSERVATIONS:  Well healing lumpectomy and SLNB scars thought increased scar tissue palpable  POSTURE:  Forward head and rounded shoulders posture   UPPER EXTREMITY AROM/PROM:   A/PROM Right 11/08/2021 Left 11/08/2021 Left 08/02/22 08/08/2022 09/03/22 Left  Shoulder extension 51 55 68  64  Shoulder flexion 155 141 131 145 134  Shoulder abduction 150 135 105 134 120  Shoulder internal rotation 55 66 46  69  Shoulder external rotation 84 88 88                            (Blank rows = not tested)    LYMPHEDEMA ASSESSMENTS:    LANDMARK RIGHT 11/08/2021 RIGHT 10.12.23 LEFT 11/08/2021 LEFT 08/02/22  10 cm proximal to olecranon process 32 30.9 32.7 31  Olecranon process 27 27.5 27._0 cm proximal to ulnar styloid process 24.9 22.8 25.2 23.4  Just proximal to ulnar styloid process 17.8 17.4 17.5 17.1  Across hand at thumb web space 19.8 20.5 21.3 19.9  At base of 2nd digit 6.6 7 7.2 6.6  (Blank rows = not tested)    Surgery type/Date: L lumpectomy and SLNB 07/10/22 Number of lymph nodes removed: 1/4 Current/past treatment (chemo, radiation, hormone therapy): did neo adj chemo had to stop early due to blood clot, then will continue chemo pills and have radiation at the end of this month Other symptoms:  Heaviness/tightness No Pain No Pitting edema No Infections No Decreased scar mobility Yes Stemmer sign No  TODAY'S TREATMENT 09/03/22: Therapeutic Exs Pulleys into flexion and abduction x2 mins each with VCs to decrease Lt scapular compensation Ball roll up wall into end motions of flexion and Lt abduction x10 each returning therapist demo A/ROM measurements taken, see above Manual Therapy P/ROM to Lt shoulder into flexion, abduction and D2 to pts available end motions and with scapular depression throughout, VCs to relax due to muscle guarding MFR to Lt axilla and medial upper arm over areas of cording  STM:  gently over scar tissue at area of lumpectomy where tissue is firm to touch, this softened some by end of session and area didn't seem as large  08/16/22: Therapeutic Exs Pulleys into flexion and abduction x2 mins each with tactile and VCs to decrease Lt scapular compensation Ball roll up wall into end motions of flexion and Lt abduction x10 each returning therapist demo Modified downward dog on wall 5x, 5 sec holds returning demo Supine scapular series with yellow  theraband returning therapist demo for each x5 each and handout issued. Pt struggled with full ROM with D2 due to muscle tightness and weakness Manual Therapy P/ROM to Lt shoulder into flexion, abduction and D2 to pts available end motions and with scapular depression throughout, VCs to relax due to muscle guarding MFR to Lt axilla and medial upper arm over areas of cording  STM: gently over scar tissue at area of lumpectomy where tissue is firm to touch, this softened some by end of session and area didn't seem as large  08/08/2022  Pulleys flexion and abduction with cueing for initial instruction and for relaxation x 34mn each.   Ball flexion x 10  Seated posterior shoulder circles x10  Standing cane flexion x 10  Standing cane abduction  Sidelying abduction AROM x 10  Supine scapular squeezes   Supine ER stretch   PROM: into flexion, abduction, ER to tolerance, directions and over clothes STM  08/06/22:  Pulleys flexion and abduction with cueing for initial instruction and for relaxation x 326m each.   Ball flexion x 5  Seated posterior shoulder circles x 5  Seated ER/shoulder retraction x 5   Supine cane flexion x 5  Sidelying abduction AROM x 5  PROM: into flexion, abduction, ER to tolerance, then Rt sidelying scapular mobilizations all directions and over clothes STM  Discussed how PT is available for hip pain and that we could add it to the referral to show her some things to do for the OA.    PATIENT EDUCATION:   Education details: ABC class, scar mobilization, importance of exercise, use compression bra; supine scapular series (08/16/22) Person educated: Patient Education method: Explanation and Handouts Education comprehension: verbalized understanding   HOME EXERCISE PROGRAM:  Reviewed previously given post op HEP. Begin walking or using stationary bike  ASSESSMENT: CLINICAL IMPRESSION: Pt has started noticing increased tightness in her Rt axilla over past week of radiation which is affecting her end Rt UE A/ROM. She will benefit from continuing physical therapy at this time to progress towards her unmet goals and help her decrease any possible future ROM loss from continued radiation.  Pt will benefit from skilled therapeutic intervention to improve on the following deficits: Decreased knowledge of precautions, impaired UE functional use, pain, decreased ROM, postural dysfunction.   PT treatment/interventions: ADL/Self care home management, Therapeutic exercises, Therapeutic activity, Patient/Family education, Self Care, Joint mobilization, Orthotic/Fit training, Manual lymph drainage, Taping, Manual therapy, and Re-evaluation     GOALS: Goals reviewed with patient? Yes  LONG TERM GOALS:  (STG=LTG)  Updated:08/08/2022  GOALS Name Target Date  Goal status  1 Pt will demonstrate she has regained full shoulder ROM and function post operatively compared to baselines.  Baseline: 10/01/2022 ONGOING  2 Pt will demonstrate 141 degrees of left shoulder flexion to allow her to reach overhead. Baseline: 131 10/01/2022 MET  3 Pt will demonstrate 135 degrees of L shoulder abduction to allow her to reach out to the side. Baseline: 105 10/01/2022 MET  4 Pt will be independent in a home exercise program for continued stretching and strengthening. 10/01/2022 IN PROGRESS     PLAN: PT FREQUENCY/DURATION: 2x/wk for 4 wks  PLAN FOR NEXT SESSION: Renewal done this session. Cont manual therapy  focusing on Lt axillary cording and end Lt shoulder P/ROM; be mindful of skin as pt still has a few weeks of radiation and this may change  RoOtelia LimesPTA 09/03/2022, 3:03 PM   Over Head Pull: Narrow and  Wide Grip   Cancer Rehab [phone]   On back, knees bent, feet flat, band across thighs, elbows straight but relaxed. Pull hands apart (start). Keeping elbows straight, bring arms up and over head, hands toward floor. Keep pull steady on band. Hold momentarily. Return slowly, keeping pull steady, back to start. Then do same with a wider grip on the band (past shoulder width) Repeat _5-10__ times. Band color __yellow____   Side Pull: Double Arm   On back, knees bent, feet flat. Arms perpendicular to body, shoulder level, elbows straight but relaxed. Pull arms out to sides, elbows straight. Resistance band comes across collarbones, hands toward floor. Hold momentarily. Slowly return to starting position. Repeat _5-10__ times. Band color _yellow____   Sword   On back, knees bent, feet flat, left hand on left hip, right hand above left. Pull right arm DIAGONALLY (hip to shoulder) across chest. Bring right arm along head toward floor. Hold momentarily. Slowly return to starting position. Repeat _5-10__ times. Do with left arm. Band color _yellow_____   Shoulder Rotation: Double Arm   On back, knees bent, feet flat, elbows tucked at sides, bent 90, hands palms up. Pull hands apart and down toward floor, keeping elbows near sides. Hold momentarily. Slowly return to starting position. Repeat _5-10__ times. Band color __yellow____

## 2022-09-03 NOTE — Addendum Note (Signed)
Addended by: Manus Gunning L on: 09/03/2022 03:19 PM   Modules accepted: Orders

## 2022-09-04 ENCOUNTER — Ambulatory Visit
Admission: RE | Admit: 2022-09-04 | Discharge: 2022-09-04 | Disposition: A | Discharge status: Home / Self Care | Payer: Medicare HMO | Source: Ambulatory Visit | Attending: Radiation Oncology | Admitting: Radiation Oncology

## 2022-09-04 ENCOUNTER — Other Ambulatory Visit: Payer: Self-pay

## 2022-09-04 DIAGNOSIS — C50212 Malignant neoplasm of upper-inner quadrant of left female breast: Secondary | ICD-10-CM | POA: Diagnosis not present

## 2022-09-04 LAB — RAD ONC ARIA SESSION SUMMARY
Course Elapsed Days: 20
Plan Fractions Treated to Date: 14
Plan Fractions Treated to Date: 14
Plan Prescribed Dose Per Fraction: 1.8 Gy
Plan Prescribed Dose Per Fraction: 1.8 Gy
Plan Total Fractions Prescribed: 28
Plan Total Fractions Prescribed: 28
Plan Total Prescribed Dose: 50.4 Gy
Plan Total Prescribed Dose: 50.4 Gy
Reference Point Dosage Given to Date: 25.2 Gy
Reference Point Dosage Given to Date: 25.2 Gy
Reference Point Session Dosage Given: 1.8 Gy
Reference Point Session Dosage Given: 1.8 Gy
Session Number: 14

## 2022-09-05 ENCOUNTER — Ambulatory Visit: Payer: Medicare HMO

## 2022-09-05 ENCOUNTER — Other Ambulatory Visit: Payer: Self-pay

## 2022-09-05 ENCOUNTER — Ambulatory Visit
Admission: RE | Admit: 2022-09-05 | Discharge: 2022-09-05 | Disposition: A | Discharge status: Home / Self Care | Payer: Medicare HMO | Source: Ambulatory Visit | Attending: Radiation Oncology | Admitting: Radiation Oncology

## 2022-09-05 DIAGNOSIS — C50212 Malignant neoplasm of upper-inner quadrant of left female breast: Secondary | ICD-10-CM | POA: Diagnosis not present

## 2022-09-05 DIAGNOSIS — Z483 Aftercare following surgery for neoplasm: Secondary | ICD-10-CM

## 2022-09-05 DIAGNOSIS — Z170 Estrogen receptor positive status [ER+]: Secondary | ICD-10-CM

## 2022-09-05 DIAGNOSIS — M25612 Stiffness of left shoulder, not elsewhere classified: Secondary | ICD-10-CM | POA: Diagnosis not present

## 2022-09-05 LAB — RAD ONC ARIA SESSION SUMMARY

## 2022-09-05 NOTE — Therapy (Signed)
OUTPATIENT PHYSICAL THERAPY BREAST CANCER TREATMENT   Patient Name: Cassandra Brennan MRN: [id number] DOB:1956/04/26, 66 y.o., female Today's Date: 09/05/2022   PT End of Session - 09/05/22 1107     Visit Number 7    Number of Visits 10    Date for PT Re-Evaluation 10/01/22    PT Start Time 1104    PT Stop Time 7373    PT Time Calculation (min) 54 min    Activity Tolerance Patient tolerated treatment well    Behavior During Therapy WFL for tasks assessed/performed              Past Medical History:  Diagnosis Date   Acute dyspnea    Acute metabolic encephalopathy    Anxiety    Arthritis    right knee    Elevated troponin    Femoral DVT (deep venous thrombosis) (HCC)    GERD (gastroesophageal reflux disease)    Hyperlipidemia    Hypertension    Lactic acidosis    Obstructive cardiovascular shock ([place]) 03/22/2022   Pneumonia    Positive colorectal cancer screening using Cologuard test    Pulmonary embolism ([place])    03/22/22   Past Surgical History:  Procedure Laterality Date   BREAST CYST EXCISION Right    pt stated in [place] Left 07/10/2022   Procedure: LEFT BREAST LUMPECTOMY WITH RADIOACTIVE SEED AND LEFT  SENTINEL LYMPH NODE MAPPING;  Surgeon: Erroll Luna, MD;  Location: [facility];  Service: General;  Laterality: Left;   BREAST SURGERY     other GI testing     unsure but had to drink a chalky drink    PORTACATH PLACEMENT N/A 11/27/2021   Procedure: INSERTION PORT-A-CATH;  Surgeon: Erroll Luna, MD;  [facility];  Service: General;  Laterality: N/A;   RADIOACTIVE SEED GUIDED AXILLARY SENTINEL LYMPH NODE Left 07/10/2022   Procedure: LEFT AXILLARY SEED LYMPH NODE BIOPSY;  Surgeon: Erroll Luna, MD;  Location: [facility];  Service: General;  Laterality: Left;   TONSILLECTOMY     TUBAL LIGATION     Patient Active Problem List   Diagnosis Date Noted    Right leg DVT ([place]) 03/25/2022   Pneumonia 03/23/2022   AKI (acute kidney injury) ([place]) 03/23/2022   Acute massive pulmonary embolism ([place]) 03/22/2022   GERD (gastroesophageal reflux disease)    Chronic anemia    Cardiogenic shock ([place])    Anxiety 12/08/2021   Genetic testing 11/30/2021   Port-A-Cath in place 11/28/2021   Malignant neoplasm of upper-inner quadrant of left breast in female, estrogen receptor positive ([place]) 11/06/2021   Hypertension 03/15/2020    PCP: Everardo Beals, NP   REFERRING PROVIDER: Erroll Luna, MD   REFERRING DIAG: Left breast cancer  THERAPY DIAG:  Stiffness of left shoulder, not elsewhere classified  Malignant neoplasm of upper-inner quadrant of left breast in female, estrogen receptor positive [facility])  Aftercare following surgery for neoplasm  Rationale for Evaluation and Treatment Rehabilitation  ONSET DATE: 10/19/21  SUBJECTIVE:  SUBJECTIVE STATEMENT: I just left radiation and saw the Dr. Sondra Come yesterday. He said everything is looking good with my skin so far.   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 It measures 2 cm and is located in the upper inner quadrant. It is weakly ER positive, PR negative and HER2 negative with a Ki67 of 60%. L breast lumpectomy and SLNB on 07/10/22 (1/4), had blood clots in her lung  PATIENT GOALS:  Reassess how my recovery is going related to arm function, pain, and swelling.  PAIN:  Are you having pain? 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 but had to stop because it flares up her hip   OBJECTIVE:   OBSERVATIONS:  Well  healing lumpectomy and SLNB scars thought increased scar tissue palpable  POSTURE:  Forward head and rounded shoulders posture   UPPER EXTREMITY AROM/PROM:   A/PROM Right 11/08/2021 Left 11/08/2021 Left 08/02/22 08/08/2022 09/03/22 Left  Shoulder extension 51 55 68  64  Shoulder flexion 155 141 131 145 134  Shoulder abduction 150 135 105 134 120  Shoulder internal rotation 55 66 46  69  Shoulder external rotation 84 88 88                            (Blank rows = not tested)    LYMPHEDEMA ASSESSMENTS:    LANDMARK RIGHT 11/08/2021 RIGHT 10.12.23 LEFT 11/08/2021 LEFT 08/02/22  10 cm proximal to olecranon process 32 30.9 32.7 31  Olecranon process 27 27.5 27._0 cm proximal to ulnar styloid process 24.9 22.8 25.2 23.4  Just proximal to ulnar styloid process 17.8 17.4 17.5 17.1  Across hand at thumb web space 19.8 20.5 21.3 19.9  At base of 2nd digit 6.6 7 7.2 6.6  (Blank rows = not tested)    Surgery type/Date: L lumpectomy and SLNB 07/10/22 Number of lymph nodes removed: 1/4 Current/past treatment (chemo, radiation, hormone therapy): did neo adj chemo had to stop early due to blood clot, then will continue chemo pills and have radiation at the end of this month Other symptoms:  Heaviness/tightness No Pain No Pitting edema No Infections No Decreased scar mobility Yes Stemmer sign No  TODAY'S TREATMENT 09/05/22: Therapeutic Exs Pulleys into flexion and abduction x2 mins each with VCs to decrease Lt scapular compensation Ball roll up wall into end motions of flexion and Lt abduction x10 each Modified downward dog on wall 5x, 5 sec each returning therapist demo Manual Therapy P/ROM to Lt shoulder into flexion, abduction and D2 to pts available end motions and with scapular depression throughout MFR to Lt axilla and medial upper arm over areas of cording which seemed some improved today STM: gently over scar tissue at area of lumpectomy where tissue is firm to touch, this  continued to soften some by end of session  09/03/22: Therapeutic Exs Pulleys into flexion and abduction x2 mins each with VCs to decrease Lt scapular compensation Ball roll up wall into end motions of flexion and Lt abduction x10 each returning therapist demo A/ROM measurements taken, see above Manual Therapy P/ROM to Lt shoulder into flexion, abduction and D2 to pts available end motions and with scapular depression throughout, VCs to relax due to muscle guarding MFR to Lt axilla  and medial upper arm over areas of cording  STM: gently over scar tissue at area of lumpectomy where tissue is firm to touch, this softened some by end of session and area didn't seem as large  08/16/22: Therapeutic Exs Pulleys into flexion and abduction x2 mins each with tactile and VCs to decrease Lt scapular compensation Ball roll up wall into end motions of flexion and Lt abduction x10 each returning therapist demo Modified downward dog on wall 5x, 5 sec holds returning demo Supine scapular series with yellow theraband returning therapist demo for each x5 each and handout issued. Pt struggled with full ROM with D2 due to muscle tightness and weakness Manual Therapy P/ROM to Lt shoulder into flexion, abduction and D2 to pts available end motions and with scapular depression throughout, VCs to relax due to muscle guarding MFR to Lt axilla and medial upper arm over areas of cording  STM: gently over scar tissue at area of lumpectomy where tissue is firm to touch, this softened some by end of session and area didn't seem as large     PATIENT EDUCATION:  Education details: ABC class, scar mobilization, importance of exercise, use compression bra; supine scapular series (08/16/22) Person educated: Patient Education method: Theatre stage manager Education comprehension: verbalized understanding   HOME EXERCISE PROGRAM:  Reviewed previously given post op HEP. Begin walking or using stationary  bike  ASSESSMENT: CLINICAL IMPRESSION: Continued with AA/ROM and added new stretch which pt tolerated well. Then continued with manual therapy working to decrease Lt axillary tightness. Her skin is doing well so far with no redness or peeling.   Pt will benefit from skilled therapeutic intervention to improve on the following deficits: Decreased knowledge of precautions, impaired UE functional use, pain, decreased ROM, postural dysfunction.   PT treatment/interventions: ADL/Self care home management, Therapeutic exercises, Therapeutic activity, Patient/Family education, Self Care, Joint mobilization, Orthotic/Fit training, Manual lymph drainage, Taping, Manual therapy, and Re-evaluation     GOALS: Goals reviewed with patient? Yes  LONG TERM GOALS:  (STG=LTG)  Updated:08/08/2022  GOALS Name Target Date  Goal status  1 Pt will demonstrate she has regained full shoulder ROM and function post operatively compared to baselines.  Baseline: 10/01/2022 ONGOING  2 Pt will demonstrate 141 degrees of left shoulder flexion to allow her to reach overhead. Baseline: 131 10/01/2022 MET  3 Pt will demonstrate 135 degrees of L shoulder abduction to allow her to reach out to the side. Baseline: 105 10/01/2022 MET  4 Pt will be independent in a home exercise program for continued stretching and strengthening. 10/01/2022 IN PROGRESS     PLAN: PT FREQUENCY/DURATION: 2x/wk for 4 wks  PLAN FOR NEXT SESSION:  Cont manual therapy focusing on Lt axillary cording and end Lt shoulder P/ROM; be mindful of skin as pt still has a few weeks of radiation and her skin fragility may worsen  Otelia Limes, PTA 09/05/2022, 12:02 PM   Over Head Pull: Narrow and Wide Grip   Cancer Rehab [phone]   On back, knees bent, feet flat, band across thighs, elbows straight but relaxed. Pull hands apart (start). Keeping elbows straight, bring arms up and over head, hands toward floor. Keep pull steady on band.  Hold momentarily. Return slowly, keeping pull steady, back to start. Then do same with a wider grip on the band (past shoulder width) Repeat _5-10__ times. Band color __yellow____   Side Pull: Double Arm   On back, knees bent, feet flat. Arms perpendicular to body, shoulder  level, elbows straight but relaxed. Pull arms out to sides, elbows straight. Resistance band comes across collarbones, hands toward floor. Hold momentarily. Slowly return to starting position. Repeat _5-10__ times. Band color _yellow____   Sword   On back, knees bent, feet flat, left hand on left hip, right hand above left. Pull right arm DIAGONALLY (hip to shoulder) across chest. Bring right arm along head toward floor. Hold momentarily. Slowly return to starting position. Repeat _5-10__ times. Do with left arm. Band color _yellow_____   Shoulder Rotation: Double Arm   On back, knees bent, feet flat, elbows tucked at sides, bent 90, hands palms up. Pull hands apart and down toward floor, keeping elbows near sides. Hold momentarily. Slowly return to starting position. Repeat _5-10__ times. Band color __yellow____

## 2022-09-06 ENCOUNTER — Ambulatory Visit
Admission: RE | Admit: 2022-09-06 | Discharge: 2022-09-06 | Disposition: A | Discharge status: Home / Self Care | Payer: Medicare HMO | Source: Ambulatory Visit | Attending: Radiation Oncology | Admitting: Radiation Oncology

## 2022-09-06 ENCOUNTER — Other Ambulatory Visit: Payer: Self-pay

## 2022-09-06 DIAGNOSIS — C50212 Malignant neoplasm of upper-inner quadrant of left female breast: Secondary | ICD-10-CM | POA: Diagnosis not present

## 2022-09-06 LAB — RAD ONC ARIA SESSION SUMMARY
Course Elapsed Days: 22
Plan Fractions Treated to Date: 16
Plan Fractions Treated to Date: 16
Plan Prescribed Dose Per Fraction: 1.8 Gy
Plan Prescribed Dose Per Fraction: 1.8 Gy
Plan Total Fractions Prescribed: 28
Plan Total Fractions Prescribed: 28
Plan Total Prescribed Dose: 50.4 Gy
Plan Total Prescribed Dose: 50.4 Gy
Reference Point Dosage Given to Date: 28.8 Gy
Reference Point Dosage Given to Date: 28.8 Gy
Reference Point Session Dosage Given: 1.8 Gy
Reference Point Session Dosage Given: 1.8 Gy
Session Number: 16

## 2022-09-07 ENCOUNTER — Other Ambulatory Visit: Payer: Self-pay

## 2022-09-07 ENCOUNTER — Ambulatory Visit
Admission: RE | Admit: 2022-09-07 | Discharge: 2022-09-07 | Disposition: A | Discharge status: Home / Self Care | Payer: Medicare HMO | Source: Ambulatory Visit | Attending: Radiation Oncology | Admitting: Radiation Oncology

## 2022-09-07 DIAGNOSIS — C50212 Malignant neoplasm of upper-inner quadrant of left female breast: Secondary | ICD-10-CM | POA: Diagnosis not present

## 2022-09-07 LAB — RAD ONC ARIA SESSION SUMMARY
Course Elapsed Days: 23
Plan Fractions Treated to Date: 17
Plan Fractions Treated to Date: 17
Plan Prescribed Dose Per Fraction: 1.8 Gy
Plan Prescribed Dose Per Fraction: 1.8 Gy
Plan Total Fractions Prescribed: 28
Plan Total Fractions Prescribed: 28
Plan Total Prescribed Dose: 50.4 Gy
Plan Total Prescribed Dose: 50.4 Gy
Reference Point Dosage Given to Date: 30.6 Gy
Reference Point Dosage Given to Date: 30.6 Gy
Reference Point Session Dosage Given: 1.8 Gy
Reference Point Session Dosage Given: 1.8 Gy
Session Number: 17

## 2022-09-09 ENCOUNTER — Other Ambulatory Visit: Payer: Self-pay

## 2022-09-09 ENCOUNTER — Ambulatory Visit
Admission: RE | Admit: 2022-09-09 | Discharge: 2022-09-09 | Disposition: A | Discharge status: Home / Self Care | Payer: Medicare HMO | Source: Ambulatory Visit | Attending: Radiation Oncology | Admitting: Radiation Oncology

## 2022-09-09 DIAGNOSIS — C50212 Malignant neoplasm of upper-inner quadrant of left female breast: Secondary | ICD-10-CM | POA: Diagnosis not present

## 2022-09-09 LAB — RAD ONC ARIA SESSION SUMMARY
Course Elapsed Days: 25
Plan Fractions Treated to Date: 18
Plan Fractions Treated to Date: 18
Plan Prescribed Dose Per Fraction: 1.8 Gy
Plan Prescribed Dose Per Fraction: 1.8 Gy
Plan Total Fractions Prescribed: 28
Plan Total Fractions Prescribed: 28
Plan Total Prescribed Dose: 50.4 Gy
Plan Total Prescribed Dose: 50.4 Gy
Reference Point Dosage Given to Date: 32.4 Gy
Reference Point Dosage Given to Date: 32.4 Gy
Reference Point Session Dosage Given: 1.8 Gy
Reference Point Session Dosage Given: 1.8 Gy
Session Number: 18

## 2022-09-09 NOTE — Progress Notes (Unsigned)
[place]   [phone] [phone]   Clinic Follow up Note   Patient Care Team: Everardo Beals, NP as PCP - General Erroll Luna, MD as Consulting Physician (General Surgery) Truitt Merle, MD as Consulting Physician (Hematology) Gery Pray, MD as Consulting Physician (Radiation Oncology) [name], RN as Oncology Nurse Navigator [name], RN as Oncology Nurse Navigator 09/11/2022  CHIEF COMPLAINT: Follow up left breast cancer   SUMMARY OF ONCOLOGIC HISTORY: Oncology History Overview Note   Cancer Staging  Malignant neoplasm of upper-inner quadrant of left breast in female, estrogen receptor positive ([place]) Staging form: Breast, AJCC 8th Edition - Clinical stage from 10/27/2021: Stage IIB (cT1c, cN1, cM0, G3, ER+, PR-, HER2-) - Signed by [name], NP on 11/28/2021 - Pathologic stage from 07/10/2022: No Stage Recommended (ypT0, pN1, cM0, G3, ER+, PR-, HER2-) - Unsigned     Malignant neoplasm of upper-inner quadrant of left breast in female, estrogen receptor positive ([place])  10/19/2021 Mammogram   CLINICAL DATA:  Patient presents for palpable left axillary abnormality.   EXAM: DIGITAL DIAGNOSTIC BILATERAL MAMMOGRAM WITH TOMOSYNTHESIS AND CAD; ULTRASOUND LEFT BREAST LIMITED  IMPRESSION: Suspicious left breast mass 10 o'clock position.   Adjacent suspicious satellite nodule left breast 12 o'clock position.   Palpable mass left axilla corresponds with a large lymph node.   10/27/2021 Initial Biopsy   Diagnosis 1. Breast, left, needle core biopsy, 12 o'clock, ribbon - FIBROADENOMA - NO MALIGNANCY IDENTIFIED 2. Breast, left, needle core biopsy, 10 o'clock, coil - INVASIVE DUCTAL CARCINOMA WITH NECROSIS - DUCTAL CARCINOMA IN SITU - SEE COMMENT 3. Lymph node, needle/core biopsy, left axilla, tribell - INVASIVE DUCTAL CARCINOMA WITH NECROSIS - NO NODAL TISSUE IDENTIFIED - SEE COMMENT Microscopic Comment 2. and 2.  Based on the biopsy, the carcinoma appears Nottingham grade 3 of 3 and measures 0.8 cm in greatest linear extent.  3. PROGNOSTIC INDICATORS Results: The tumor cells are NEGATIVE for Her2 (1+). Estrogen Receptor: 30%, POSITIVE, WEAK STAINING INTENSITY Progesterone Receptor: 0%, NEGATIVE Proliferation Marker Ki67: 60%   10/27/2021 Cancer Staging   Staging form: Breast, AJCC 8th Edition - Clinical stage from 10/27/2021: Stage IIB (cT1c, cN1, cM0, G3, ER+, PR-, HER2-) - Signed by [name], NP on 11/28/2021 Stage prefix: Initial diagnosis Histologic grading system: 3 grade system   11/06/2021 Initial Diagnosis   Malignant neoplasm of upper-inner quadrant of left breast in female, estrogen receptor positive ([place])   11/15/2021 Breast MRI   IMPRESSION: 1.9 cm mass in the upper-inner quadrant of the left breast corresponding with the known invasive ductal carcinoma. Enlarged left axillary lymph node corresponding with known metastatic disease.   RECOMMENDATION: Treatment planning of the known left breast cancer and axillary metastasis is recommended.   Additional imaging evaluation of the mass in the liver is recommended with CT with liver mass protocol or MRI.   11/22/2021 Imaging   Bone scan IMPRESSION: No scintigraphic evidence of bony metastatic disease. Degenerative changes as above.   11/22/2021 Echocardiogram   Baseline echo LVEF 55-60%, normal GLS -21.7%   11/23/2021 Imaging   CT CAP IMPRESSION: 1. Bulky lymphadenopathy in the left axilla including a 5.0 x 2.8 cm lymph node. 2. 15 mm nodule identified in the upper inner quadrant of the left breast. 3. No suspicious pulmonary nodule or mass. No evidence for metastatic disease in the abdomen or pelvis. 4. 11 mm cyst in the dome of the left liver. Adjacent tiny hypoattenuating lesions in the right liver  are too small to characterize, but likely benign. Attention on follow-up recommended. 5. Prominent stool volume raises the question of  clinical constipation.   11/28/2021 - 03/20/2022 Chemotherapy   Patient is on Treatment Plan : BREAST ADJUVANT DOSE DENSE AC q14d / PACLitaxel q7d      Genetic Testing   [facility] CancerNext was Negative. Report date is 11/26/2021.   The CancerNext gene panel offered by [employer] includes sequencing, rearrangement analysis, and RNA analysis for the following 36 genes:   APC, ATM, AXIN2, BARD1, BMPR1A, BRCA1, BRCA2, BRIP1, CDH1, CDK4, CDKN2A, CHEK2, DICER1, HOXB13, EPCAM, GREM1, MLH1, MSH2, MSH3, MSH6, MUTYH, NBN, NF1, NTHL1, PALB2, PMS2, POLD1, POLE, PTEN, RAD51C, RAD51D, RECQL, SMAD4, SMARCA4, STK11, and TP53.    07/10/2022 Definitive Surgery   FINAL MICROSCOPIC DIAGNOSIS:   A. BREAST, LEFT, LUMPECTOMY:  - No residual carcinoma identified  - Treatment effect present (see comment)  - Ductal carcinoma in situ present, ypTis  - Margins negative for ductal carcinoma in situ  - See oncology table   B. BREAST, LEFT ADDITIONAL MEDIAL MARGIN, EXCISION:  - Negative for carcinoma   C. BREAST, LEFT ADDITIONAL LATERAL MARGIN, EXCISION:  - Negative for carcinoma   D. LYMPH NODE, LEFT AXILLARY, SENTINEL, EXCISION:  - Positive for carcinoma (1/1)   E. LYMPH NODE, LEFT AXILLARY, SENTINEL, EXCISION:  - Negative for carcinoma (0/1)   F. LYMPH NODE, LEFT, AXILLARY CONTENTS:  - Two lymph nodes identified  - Negative for carcinoma (0/2)    ADDENDUM:  Part D: Left axillary sentinel lymph node,   PROGNOSTIC INDICATOR RESULTS:  The tumor cells are NEGATIVE for Her2 (0).  Estrogen Receptor:       POSITIVE, 20%, WEAK STAINING  Progesterone Receptor:   POSITIVE, 2%, MODERATE STAINING      CURRENT THERAPY: Concurrent chemoRT with Xeloda 1500 mg BID M-F on days with radiation; starting 08/15/22  INTERVAL HISTORY: Ms. Deschene returns for follow up as scheduled, last seen 08/27/22. She continues chemoRT with Xeloda, tolerating both well.  She feels a little tired, but remains functional and active at  home.  Hands are little dry and dark without pain, redness, or cracks.  She feels a little "queasy" at times but not significantly nauseous, and no vomiting; does not even require medication.  She is able to eat and drink.  Joints feel generally stiff, sometimes uses a cane for stability.  She has started riding a stationary bike.  All other systems were reviewed with the patient and are negative.  MEDICAL HISTORY:  Past Medical History:  Diagnosis Date   Acute dyspnea    Acute metabolic encephalopathy    Anxiety    Arthritis    right knee    Elevated troponin    Femoral DVT (deep venous thrombosis) ([facility])    GERD (gastroesophageal reflux disease)    Hyperlipidemia    Hypertension    Lactic acidosis    Obstructive cardiovascular shock ([place]) 03/22/2022   Pneumonia    Positive colorectal cancer screening using Cologuard test    Pulmonary embolism ([place])    03/22/22    SURGICAL HISTORY: Past Surgical History:  Procedure Laterality Date   BREAST CYST EXCISION Right    pt stated in [place] Left 07/10/2022   Procedure: LEFT BREAST LUMPECTOMY WITH RADIOACTIVE SEED AND LEFT  SENTINEL LYMPH NODE MAPPING;  Surgeon: Erroll Luna, MD;  Location: [facility];  Service: General;  Laterality: Left;  BREAST SURGERY     other GI testing     unsure but had to drink a chalky drink    PORTACATH PLACEMENT N/A 11/27/2021   Procedure: INSERTION PORT-A-CATH;  Surgeon: Erroll Luna, MD;  Location: [facility];  Service: General;  Laterality: N/A;   RADIOACTIVE SEED GUIDED AXILLARY SENTINEL LYMPH NODE Left 07/10/2022   Procedure: LEFT AXILLARY SEED LYMPH NODE BIOPSY;  Surgeon: Erroll Luna, MD;  Location: [facility];  Service: General;  Laterality: Left;   TONSILLECTOMY     TUBAL LIGATION      I have reviewed the social history and family history with the patient and they are unchanged from previous  note.  ALLERGIES:  is allergic to prednisone, rosuvastatin, other, robitussin (alcohol free) [guaifenesin], and sulfa antibiotics.  MEDICATIONS:  Current Outpatient Medications  Medication Sig Dispense Refill   acetaminophen (TYLENOL) 500 MG tablet Take 500 mg by mouth every 8 (eight) hours as needed for moderate pain.     ALPRAZolam (XANAX) 0.25 MG tablet Take 1 tablet (0.25 mg total) by mouth daily as needed for anxiety. 30 tablet 0   apixaban (ELIQUIS) 5 MG TABS tablet TAKE 1 TABLET(5 MG) BY MOUTH TWICE DAILY 60 tablet 3   capecitabine (XELODA) 500 MG tablet Take 3 tablets (1,500 mg total) by mouth 2 (two) times daily after a meal. Take on days of radiation, Monday through Friday 99 tablet 1   lidocaine-prilocaine (EMLA) cream Apply to affected area once 30 g 3   LISINOPRIL PO Take 1 tablet by mouth daily.     metoprolol succinate (TOPROL-XL) 100 MG 24 hr tablet Take 100 mg by mouth daily. Take with or immediately following a meal.     Multiple Vitamins-Minerals (MULTIVITAMIN ADULTS) TABS Take 1 tablet by mouth daily.     ondansetron (ZOFRAN) 8 MG tablet Take 1 tablet (8 mg total) by mouth 2 (two) times daily as needed. Start on the third day after chemotherapy. (Patient taking differently: Take 8 mg by mouth 2 (two) times daily as needed for nausea or vomiting. Start on the third day after chemotherapy.) 30 tablet 1   pravastatin (PRAVACHOL) 20 MG tablet Take 1 tablet (20 mg total) by mouth daily. 30 tablet 0   prochlorperazine (COMPAZINE) 10 MG tablet Take 1 tablet (10 mg total) by mouth every 6 (six) hours as needed (Nausea or vomiting). 30 tablet 1   No current facility-administered medications for this visit.    PHYSICAL EXAMINATION: ECOG PERFORMANCE STATUS: 1 - Symptomatic but completely ambulatory  Vitals:   09/10/22 0928  BP: (!) 140/78  Pulse: 90  Resp: 16  Temp: 99.8 F (37.7 C)  SpO2: 100%   Filed Weights   09/10/22 0928  Weight: 173 lb 8 oz (78.7 kg)     GENERAL:alert, no distress and comfortable SKIN: No rash.  Palms dry with mild hyperpigmentation, no erythema or cracks EYES: sclera clear LUNGS: normal breathing effort HEART:  no lower extremity edema NEURO: alert & oriented x 3 with fluent speech, no focal motor/sensory deficits Breast exam: Visual inspection revealed closed/well-healed lumpectomy incisions and very faint hyperpigmentation to the left breast.  No significant erythema, dermatitis, or lymphedema  LABORATORY DATA:  I have reviewed the data as listed    Latest Ref Rng & Units 09/10/2022    9:01 AM 09/03/2022    9:08 AM 08/27/2022    9:07 AM  CBC  WBC 4.0 - 10.5 K/uL 6.6  8.0  6.9   Hemoglobin 12.0 -  15.0 g/dL 10.6  11.3  11.4   Hematocrit 36.0 - 46.0 % 29.8  32.4  32.4   Platelets 150 - 400 K/uL 209  263  262         Latest Ref Rng & Units 09/10/2022    9:01 AM 09/03/2022    9:08 AM 08/27/2022    9:07 AM  CMP  Glucose 70 - 99 mg/dL 124  99  94   BUN 8 - 23 mg/dL _0 Creatinine 0.44 - 1.00 mg/dL 0.94  1.00  0.88   Sodium 135 - 145 mmol/L 142  143  141   Potassium 3.5 - 5.1 mmol/L 3.4  3.4  3.6   Chloride 98 - 111 mmol/L 105  104  104   CO2 22 - 32 mmol/L 31  33  31   Calcium 8.9 - 10.3 mg/dL 9.9  9.5  9.5   Total Protein 6.5 - 8.1 g/dL 7.2  7.5  7.1   Total Bilirubin 0.3 - 1.2 mg/dL 0.6  0.5  0.5   Alkaline Phos 38 - 126 U/L 85  88  91   AST 15 - 41 U/L _1 ALT 0 - 44 U/L _2 RADIOGRAPHIC STUDIES: I have personally reviewed the radiological images as listed and agreed with the findings in the report. No results found.   ASSESSMENT & PLAN: Cassandra Brennan is a 66 y.o. postmenopausal female    1. 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 B/l diagnostic MM and left [place] 10/19/21 showed: 2 cm mass at 10 o'clock; 5 mm indeterminate satellite mass  at 12 o'clock; palpable left axilla mass corresponds with large lymph node. Biopsy 10/27/21 confirmed invasive ductal carcinoma with necrosis to both 10 o'clock and lymph node, grade 3. ER weakly positive (30%). 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  Neoadjuvant chemotherapy has been recommended.  She was seen by Dr. Brantley Stage and will likely proceed with lumpectomy, SLNB and targeted lymph node dissection following chemo.  She would benefit from adjuvant radiation if she has lumpectomy. -breast MRI 11/15/2021 was previously reviewed, which shows the biopsy proven malignancy in the upper inner left breat measuring 1.5 x1.3 x1.9 cm, a biopsy proven malignant 4.3 cm left axillary LN and 2 other abnormal LNs measuring 1.3 and 1.5 cm, and a 1.3 liver mass likely representing a cyst.  -CT CAP 11/23/2021 was previously reviewed, which shows known left breast cancer and bulky axillary adenopathy, a liver cyst, and tiny indeterminate liver nodules too small to characterize. We will monitor these in the future. No evidence of distant metastasis.  -Bone scan is negative for osseous metastasis.  -Baseline echo is normal, LVEF 55-60%. -Left breast mass in the upper inner quadrant measured 2 cm and 2 palpable lymph nodes largest measuring 4.5 cm on 2/7 the day she began neoadjuvant chemo, for reference -She completed 4 cycles neoadjuvant AC and received 9 of 12 cycles of weekly taxol, stopped early due to massive PE. Breast mass was no longer palpable on chemo.  -She took anastrozole 04/17/22 - 06/22/22 while awaiting surgery, this was stopped due to arthralgia -S/p left lumpectomy 07/10/22 by Dr. Brantley Stage, path  showed complete pathologic response in breast but 1/4 + LNs, repeat prognostic panel showed ER 20% weak, PR 2% moderate, and HER2 negative -She began concurrent chemo RT with Xeloda on 08/15/2022,  tolerating well thus far.  The plan is to continue Xeloda for 6 months -Ms. Hurd is clinically doing well.  Tolerating chemo RT with Xeloda 1500 mg twice daily on days with radiation with mild fatigue and hyperpigmentation on her palms, no significant hand-foot syndrome or other side effects.  -Labs reviewed, K3.4 for the past 2 weeks.  I recommend to resume oral K p.o. once daily, she has some at home, otherwise adequate to continue, same dose -She will return for weekly lab and follow-up every 2 weeks on treatment  2. Bilateral pulmonary emboli, at least submassive, with right heart strain, right femoral DVT -S/p cycle 9 taxol, admitted 04/11/22 s/p thrombolysis.  -likely secondary to underlying malignancy and chemo; taxol was discontinued -tolerating eliquis, will continue at least 6 months, possibly indefinitely  -no s/sx of recurrence   3.  Hypertension and anxiety -Continue medications. -Follow-up with primary care physician -Continue metoprolol and xanax   PLAN: -Labs reviewed, continue chemo RT with Xeloda 1500 mg BID on days with radiation  -Restart oral K 10 mEq p.o. once daily -Continue weekly lab -Follow-up 12/4   All questions were answered. The patient knows to call the clinic with any problems, questions or concerns. No barriers to learning were detected.      [name], NP 09/11/22

## 2022-09-10 ENCOUNTER — Ambulatory Visit
Admission: RE | Admit: 2022-09-10 | Discharge: 2022-09-10 | Disposition: A | Discharge status: Home / Self Care | Payer: Medicare HMO | Source: Ambulatory Visit | Attending: Radiation Oncology | Admitting: Radiation Oncology

## 2022-09-10 ENCOUNTER — Other Ambulatory Visit: Payer: Self-pay

## 2022-09-10 ENCOUNTER — Other Ambulatory Visit (HOSPITAL_COMMUNITY): Payer: Self-pay

## 2022-09-10 ENCOUNTER — Inpatient Hospital Stay (HOSPITAL_BASED_OUTPATIENT_CLINIC_OR_DEPARTMENT_OTHER): Payer: Medicare HMO | Admitting: Nurse Practitioner

## 2022-09-10 ENCOUNTER — Inpatient Hospital Stay: Payer: Medicare HMO

## 2022-09-10 VITALS — BP 140/78 | HR 90 | Temp 99.8°F | Resp 16 | Wt 173.5 lb

## 2022-09-10 DIAGNOSIS — C50212 Malignant neoplasm of upper-inner quadrant of left female breast: Secondary | ICD-10-CM

## 2022-09-10 DIAGNOSIS — Z170 Estrogen receptor positive status [ER+]: Secondary | ICD-10-CM

## 2022-09-10 LAB — CMP (CANCER CENTER ONLY)
ALT: 14 U/L (ref 0–44)
AST: 13 U/L — ABNORMAL LOW (ref 15–41)
Albumin: 4.3 g/dL (ref 3.5–5.0)
Alkaline Phosphatase: 85 U/L (ref 38–126)
Anion gap: 6 (ref 5–15)
BUN: 16 mg/dL (ref 8–23)
CO2: 31 mmol/L (ref 22–32)
Calcium: 9.9 mg/dL (ref 8.9–10.3)
Chloride: 105 mmol/L (ref 98–111)
Creatinine: 0.94 mg/dL (ref 0.44–1.00)
GFR, Estimated: >60 mL/min (ref >60)
Glucose, Bld: 124 mg/dL — ABNORMAL HIGH (ref 70–99)
Potassium: 3.4 mmol/L — ABNORMAL LOW (ref 3.5–5.1)
Sodium: 142 mmol/L (ref 135–145)
Total Bilirubin: 0.6 mg/dL (ref 0.3–1.2)
Total Protein: 7.2 g/dL (ref 6.5–8.1)

## 2022-09-10 LAB — RAD ONC ARIA SESSION SUMMARY
Course Elapsed Days: 26
Plan Fractions Treated to Date: 19
Plan Fractions Treated to Date: 19
Plan Prescribed Dose Per Fraction: 1.8 Gy
Plan Prescribed Dose Per Fraction: 1.8 Gy
Plan Total Fractions Prescribed: 28
Plan Total Fractions Prescribed: 28
Plan Total Prescribed Dose: 50.4 Gy
Plan Total Prescribed Dose: 50.4 Gy
Reference Point Dosage Given to Date: 34.2 Gy
Reference Point Dosage Given to Date: 34.2 Gy
Reference Point Session Dosage Given: 1.8 Gy
Reference Point Session Dosage Given: 1.8 Gy
Session Number: 19

## 2022-09-10 LAB — SAMPLE TO BLOOD BANK

## 2022-09-10 LAB — CBC WITH DIFFERENTIAL (CANCER CENTER ONLY)
Abs Immature Granulocytes: 0.02 10*3/uL (ref 0.00–0.07)
Basophils Absolute: 0 10*3/uL (ref 0.0–0.1)
Basophils Relative: 0 %
Eosinophils Absolute: 0.6 10*3/uL — ABNORMAL HIGH (ref 0.0–0.5)
Eosinophils Relative: 9 %
HCT: 29.8 % — ABNORMAL LOW (ref 36.0–46.0)
Hemoglobin: 10.6 g/dL — ABNORMAL LOW (ref 12.0–15.0)
Immature Granulocytes: 0 %
Lymphocytes Relative: 10 %
Lymphs Abs: 0.7 10*3/uL (ref 0.7–4.0)
MCH: 31.5 pg (ref 26.0–34.0)
MCHC: 35.6 g/dL (ref 30.0–36.0)
MCV: 88.4 fL (ref 80.0–100.0)
Monocytes Absolute: 0.4 10*3/uL (ref 0.1–1.0)
Monocytes Relative: 5 %
Neutro Abs: 5 10*3/uL (ref 1.7–7.7)
Neutrophils Relative %: 76 %
Platelet Count: 209 10*3/uL (ref 150–400)
RBC: 3.37 MIL/uL — ABNORMAL LOW (ref 3.87–5.11)
RDW: 16.8 % — ABNORMAL HIGH (ref 11.5–15.5)
WBC Count: 6.6 10*3/uL (ref 4.0–10.5)
nRBC: 0 % (ref 0.0–0.2)

## 2022-09-11 ENCOUNTER — Encounter: Payer: Self-pay | Admitting: Nurse Practitioner

## 2022-09-11 ENCOUNTER — Other Ambulatory Visit: Payer: Self-pay

## 2022-09-11 ENCOUNTER — Telehealth: Payer: Self-pay | Admitting: Radiation Oncology

## 2022-09-11 ENCOUNTER — Other Ambulatory Visit (HOSPITAL_COMMUNITY): Payer: Self-pay

## 2022-09-11 ENCOUNTER — Encounter: Payer: Self-pay | Admitting: Hematology

## 2022-09-11 ENCOUNTER — Ambulatory Visit
Admission: RE | Admit: 2022-09-11 | Discharge: 2022-09-11 | Disposition: A | Discharge status: Home / Self Care | Payer: Medicare HMO | Source: Ambulatory Visit | Attending: Radiation Oncology | Admitting: Radiation Oncology

## 2022-09-11 ENCOUNTER — Ambulatory Visit: Payer: Medicare HMO

## 2022-09-11 DIAGNOSIS — Z483 Aftercare following surgery for neoplasm: Secondary | ICD-10-CM

## 2022-09-11 DIAGNOSIS — M25612 Stiffness of left shoulder, not elsewhere classified: Secondary | ICD-10-CM

## 2022-09-11 DIAGNOSIS — Z170 Estrogen receptor positive status [ER+]: Secondary | ICD-10-CM

## 2022-09-11 DIAGNOSIS — C50212 Malignant neoplasm of upper-inner quadrant of left female breast: Secondary | ICD-10-CM | POA: Diagnosis not present

## 2022-09-11 LAB — RAD ONC ARIA SESSION SUMMARY
Course Elapsed Days: 27
Plan Fractions Treated to Date: 20
Plan Fractions Treated to Date: 20
Plan Prescribed Dose Per Fraction: 1.8 Gy
Plan Prescribed Dose Per Fraction: 1.8 Gy
Plan Total Fractions Prescribed: 28
Plan Total Fractions Prescribed: 28
Plan Total Prescribed Dose: 50.4 Gy
Plan Total Prescribed Dose: 50.4 Gy
Reference Point Dosage Given to Date: 36 Gy
Reference Point Dosage Given to Date: 36 Gy
Reference Point Session Dosage Given: 1.8 Gy
Reference Point Session Dosage Given: 1.8 Gy
Session Number: 20

## 2022-09-11 NOTE — Therapy (Signed)
OUTPATIENT PHYSICAL THERAPY BREAST CANCER TREATMENT   Patient Name: Cassandra Brennan MRN: [id number] DOB:[birth year], 66 y.o., female Today's Date: 09/11/2022   PT End of Session - 09/11/22 1110     Visit Number 8    Number of Visits 10    Date for PT Re-Evaluation 10/01/22    PT Start Time 1107    PT Stop Time 1202    PT Time Calculation (min) 55 min    Activity Tolerance Patient tolerated treatment well    Behavior During Therapy WFL for tasks assessed/performed              Past Medical History:  Diagnosis Date   Acute dyspnea    Acute metabolic encephalopathy    Anxiety    Arthritis    right knee    Elevated troponin    Femoral DVT (deep venous thrombosis) (HCC)    GERD (gastroesophageal reflux disease)    Hyperlipidemia    Hypertension    Lactic acidosis    Obstructive cardiovascular shock ([place]) 03/22/2022   Pneumonia    Positive colorectal cancer screening using Cologuard test    Pulmonary embolism ([place])    03/22/22   Past Surgical History:  Procedure Laterality Date   BREAST CYST EXCISION Right    pt stated in [place] Left 07/10/2022   Procedure: LEFT BREAST LUMPECTOMY WITH RADIOACTIVE SEED AND LEFT  SENTINEL LYMPH NODE MAPPING;  Surgeon: Erroll Luna, MD;  Location: [place];  Service: General;  Laterality: Left;   BREAST SURGERY     other GI testing     unsure but had to drink a chalky drink    PORTACATH PLACEMENT N/A 11/27/2021   Procedure: INSERTION PORT-A-CATH;  Surgeon: Erroll Luna, MD;  Location: [facility];  Service: General;  Laterality: N/A;   RADIOACTIVE SEED GUIDED AXILLARY SENTINEL LYMPH NODE Left 07/10/2022   Procedure: LEFT AXILLARY SEED LYMPH NODE BIOPSY;  Surgeon: Erroll Luna, MD;  Location: [place];  Service: General;  Laterality: Left;   TONSILLECTOMY     TUBAL LIGATION     Patient Active Problem List   Diagnosis Date Noted    Right leg DVT ([place]) 03/25/2022   Pneumonia 03/23/2022   AKI (acute kidney injury) ([place]) 03/23/2022   Acute massive pulmonary embolism ([place]) 03/22/2022   GERD (gastroesophageal reflux disease)    Chronic anemia    Cardiogenic shock ([place])    Anxiety 12/08/2021   Genetic testing 11/30/2021   Port-A-Cath in place 11/28/2021   Malignant neoplasm of upper-inner quadrant of left breast in female, estrogen receptor positive ([place]) 11/06/2021   Hypertension 03/15/2020    PCP: Everardo Beals, NP   REFERRING PROVIDER: Erroll Luna, MD   REFERRING DIAG: Left breast cancer  THERAPY DIAG:  Stiffness of left shoulder, not elsewhere classified  Malignant neoplasm of upper-inner quadrant of left breast in female, estrogen receptor positive [facility])  Aftercare following surgery for neoplasm  Rationale for Evaluation and Treatment Rehabilitation  ONSET DATE: 10/19/21  SUBJECTIVE:  SUBJECTIVE STATEMENT: I already had radiation this morning and my skin is still doing well.   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 It measures 2 cm and is located in the upper inner quadrant. It is weakly ER positive, PR negative and HER2 negative with a Ki67 of 60%. L breast lumpectomy and SLNB on 07/10/22 (1/4), had blood clots in her lung  PATIENT GOALS:  Reassess how my recovery is going related to arm function, pain, and swelling.  PAIN:  Are you having pain? 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 but had to stop because it flares up her hip   OBJECTIVE:   OBSERVATIONS:  Well healing lumpectomy and SLNB scars thought  increased scar tissue palpable  POSTURE:  Forward head and rounded shoulders posture   UPPER EXTREMITY AROM/PROM:   A/PROM Right 11/08/2021 Left 11/08/2021 Left 08/02/22 08/08/2022 09/03/22 Left  Shoulder extension 51 55 68  64  Shoulder flexion 155 141 131 145 134  Shoulder abduction 150 135 105 134 120  Shoulder internal rotation 55 66 46  69  Shoulder external rotation 84 88 88                            (Blank rows = not tested)    LYMPHEDEMA ASSESSMENTS:    LANDMARK RIGHT 11/08/2021 RIGHT 10.12.23 LEFT 11/08/2021 LEFT 08/02/22  10 cm proximal to olecranon process 32 30.9 32.7 31  Olecranon process 27 27.5 27._0 cm proximal to ulnar styloid process 24.9 22.8 25.2 23.4  Just proximal to ulnar styloid process 17.8 17.4 17.5 17.1  Across hand at thumb web space 19.8 20.5 21.3 19.9  At base of 2nd digit 6.6 7 7.2 6.6  (Blank rows = not tested)    Surgery type/Date: L lumpectomy and SLNB 07/10/22 Number of lymph nodes removed: 1/4 Current/past treatment (chemo, radiation, hormone therapy): did neo adj chemo had to stop early due to blood clot, then will continue chemo pills and have radiation at the end of this month Other symptoms:  Heaviness/tightness No Pain No Pitting edema No Infections No Decreased scar mobility Yes Stemmer sign No  TODAY'S TREATMENT 09/11/22: Therapeutic Exs Pulleys into flexion and abduction x2 mins each with VCs to decrease Lt scapular compensation Ball roll up wall into end motions of flexion and Lt abduction x10 each Modified downward dog on wall 5x, 5 sec each returning therapist demo Manual Therapy P/ROM to Lt shoulder into flexion, abduction and D2 to pts available end motions and with scapular depression throughout MFR to Lt axilla and medial upper arm over areas of cording, 1 pop noted during stretching STM: gently over scar tissue at area of lumpectomy where tissue is firm to touch, this continued to soften however discontinued  this during session as pts incision appeared to have small skin tear at ends of incision. Not sure if that was already there but showed pt with mirror areas of concern so she could be mindful of watching this over next few days.  Scap Mobs: Rt S/L for protraction and retraction to Lt scapula  09/05/22: Therapeutic Exs Pulleys into flexion and abduction x2 mins each with VCs to decrease Lt scapular compensation Ball roll up wall into end motions of flexion and  Lt abduction x10 each Modified downward dog on wall 5x, 5 sec each returning therapist demo Manual Therapy P/ROM to Lt shoulder into flexion, abduction and D2 to pts available end motions and with scapular depression throughout MFR to Lt axilla and medial upper arm over areas of cording which seemed some improved today STM: gently over scar tissue at area of lumpectomy where tissue is firm to touch, this continued to soften some by end of session  09/03/22: Therapeutic Exs Pulleys into flexion and abduction x2 mins each with VCs to decrease Lt scapular compensation Ball roll up wall into end motions of flexion and Lt abduction x10 each returning therapist demo A/ROM measurements taken, see above Manual Therapy P/ROM to Lt shoulder into flexion, abduction and D2 to pts available end motions and with scapular depression throughout, VCs to relax due to muscle guarding MFR to Lt axilla and medial upper arm over areas of cording  STM: gently over scar tissue at area of lumpectomy where tissue is firm to touch, this softened some by end of session and area didn't seem as large      PATIENT EDUCATION:  Education details: ABC class, scar mobilization, importance of exercise, use compression bra; supine scapular series (08/16/22) Person educated: Patient Education method: Theatre stage manager Education comprehension: verbalized understanding   HOME EXERCISE PROGRAM:  Reviewed previously given post op HEP. Begin walking or using  stationary bike  ASSESSMENT: CLINICAL IMPRESSION: Pts skin is still doing well so able to continue AA/ROM. Her cording is still present though she feels is starting to not be as tight as it has been and 1 pop was noted during session in axilla. Started noticing some possible skin fragility starting to develop at either ends of incision. Showed pt area of concern so she could be mindful of watching area for progression. Advised her that if she does start noticing increased skin fragility we would hold physical therapy until about 2 weeks after her last radiation appt. Pt verbalized understanding. She missed the ABC class yesterday and wants to be moved to the list for the next one which will be Dec 18 so did that for pt.   Pt will benefit from skilled therapeutic intervention to improve on the following deficits: Decreased knowledge of precautions, impaired UE functional use, pain, decreased ROM, postural dysfunction.   PT treatment/interventions: ADL/Self care home management, Therapeutic exercises, Therapeutic activity, Patient/Family education, Self Care, Joint mobilization, Orthotic/Fit training, Manual lymph drainage, Taping, Manual therapy, and Re-evaluation     GOALS: Goals reviewed with patient? Yes  LONG TERM GOALS:  (STG=LTG)  Updated:08/08/2022  GOALS Name Target Date  Goal status  1 Pt will demonstrate she has regained full shoulder ROM and function post operatively compared to baselines.  Baseline: 10/01/2022 ONGOING  2 Pt will demonstrate 141 degrees of left shoulder flexion to allow her to reach overhead. Baseline: 131 10/01/2022 MET  3 Pt will demonstrate 135 degrees of L shoulder abduction to allow her to reach out to the side. Baseline: 105 10/01/2022 MET  4 Pt will be independent in a home exercise program for continued stretching and strengthening. 10/01/2022 IN PROGRESS     PLAN: PT FREQUENCY/DURATION: 2x/wk for 4 wks  PLAN FOR NEXT SESSION:  How is skin at ends  of incision? If skin okay cont manual therapy focusing on Lt axillary cording and end Lt shoulder P/ROM; be mindful of skin as pt still has a few weeks of radiation and her skin fragility may worsen  Otelia Limes, PTA 09/11/2022, 12:06 PM   Over Head Pull: Narrow and Wide Grip   Cancer Rehab [phone]   On back, knees bent, feet flat, band across thighs, elbows straight but relaxed. Pull hands apart (start). Keeping elbows straight, bring arms up and over head, hands toward floor. Keep pull steady on band. Hold momentarily. Return slowly, keeping pull steady, back to start. Then do same with a wider grip on the band (past shoulder width) Repeat _5-10__ times. Band color __yellow____   Side Pull: Double Arm   On back, knees bent, feet flat. Arms perpendicular to body, shoulder level, elbows straight but relaxed. Pull arms out to sides, elbows straight. Resistance band comes across collarbones, hands toward floor. Hold momentarily. Slowly return to starting position. Repeat _5-10__ times. Band color _yellow____   Sword   On back, knees bent, feet flat, left hand on left hip, right hand above left. Pull right arm DIAGONALLY (hip to shoulder) across chest. Bring right arm along head toward floor. Hold momentarily. Slowly return to starting position. Repeat _5-10__ times. Do with left arm. Band color _yellow_____   Shoulder Rotation: Double Arm   On back, knees bent, feet flat, elbows tucked at sides, bent 90, hands palms up. Pull hands apart and down toward floor, keeping elbows near sides. Hold momentarily. Slowly return to starting position. Repeat _5-10__ times. Band color __yellow____

## 2022-09-11 NOTE — Telephone Encounter (Signed)
Returned patient VM inquiring her [place] schedule. No answer, LVM for a return call.

## 2022-09-12 ENCOUNTER — Other Ambulatory Visit: Payer: Self-pay

## 2022-09-12 ENCOUNTER — Ambulatory Visit
Admission: RE | Admit: 2022-09-12 | Discharge: 2022-09-12 | Disposition: A | Discharge status: Home / Self Care | Payer: Medicare HMO | Source: Ambulatory Visit | Attending: Radiation Oncology | Admitting: Radiation Oncology

## 2022-09-12 DIAGNOSIS — C50212 Malignant neoplasm of upper-inner quadrant of left female breast: Secondary | ICD-10-CM | POA: Diagnosis not present

## 2022-09-12 LAB — RAD ONC ARIA SESSION SUMMARY
Course Elapsed Days: 28
Plan Fractions Treated to Date: 21
Plan Fractions Treated to Date: 21
Plan Prescribed Dose Per Fraction: 1.8 Gy
Plan Prescribed Dose Per Fraction: 1.8 Gy
Plan Total Fractions Prescribed: 28
Plan Total Fractions Prescribed: 28
Plan Total Prescribed Dose: 50.4 Gy
Plan Total Prescribed Dose: 50.4 Gy
Reference Point Dosage Given to Date: 37.8 Gy
Reference Point Dosage Given to Date: 37.8 Gy
Reference Point Session Dosage Given: 1.8 Gy
Reference Point Session Dosage Given: 1.8 Gy
Session Number: 21

## 2022-09-17 ENCOUNTER — Other Ambulatory Visit: Payer: Self-pay

## 2022-09-17 ENCOUNTER — Ambulatory Visit
Admission: RE | Admit: 2022-09-17 | Discharge: 2022-09-17 | Disposition: A | Discharge status: Home / Self Care | Payer: Medicare HMO | Source: Ambulatory Visit | Attending: Radiation Oncology | Admitting: Radiation Oncology

## 2022-09-17 ENCOUNTER — Inpatient Hospital Stay: Payer: Medicare HMO

## 2022-09-17 DIAGNOSIS — Z170 Estrogen receptor positive status [ER+]: Secondary | ICD-10-CM

## 2022-09-17 DIAGNOSIS — C50212 Malignant neoplasm of upper-inner quadrant of left female breast: Secondary | ICD-10-CM | POA: Diagnosis not present

## 2022-09-17 LAB — CMP (CANCER CENTER ONLY)
ALT: 13 U/L (ref 0–44)
AST: 12 U/L — ABNORMAL LOW (ref 15–41)
Albumin: 4.3 g/dL (ref 3.5–5.0)
Alkaline Phosphatase: 86 U/L (ref 38–126)
Anion gap: 6 (ref 5–15)
BUN: 15 mg/dL (ref 8–23)
CO2: 30 mmol/L (ref 22–32)
Calcium: 9.6 mg/dL (ref 8.9–10.3)
Chloride: 105 mmol/L (ref 98–111)
Creatinine: 0.96 mg/dL (ref 0.44–1.00)
GFR, Estimated: >60 mL/min (ref >60)
Glucose, Bld: 103 mg/dL — ABNORMAL HIGH (ref 70–99)
Potassium: 3.7 mmol/L (ref 3.5–5.1)
Sodium: 141 mmol/L (ref 135–145)
Total Bilirubin: 0.6 mg/dL (ref 0.3–1.2)
Total Protein: 7 g/dL (ref 6.5–8.1)

## 2022-09-17 LAB — CBC WITH DIFFERENTIAL (CANCER CENTER ONLY)
Abs Immature Granulocytes: 0.02 10*3/uL (ref 0.00–0.07)
Basophils Absolute: 0 10*3/uL (ref 0.0–0.1)
Basophils Relative: 0 %
Eosinophils Absolute: 0.7 10*3/uL — ABNORMAL HIGH (ref 0.0–0.5)
Eosinophils Relative: 10 %
HCT: 28.9 % — ABNORMAL LOW (ref 36.0–46.0)
Hemoglobin: 10.1 g/dL — ABNORMAL LOW (ref 12.0–15.0)
Immature Granulocytes: 0 %
Lymphocytes Relative: 7 %
Lymphs Abs: 0.5 10*3/uL — ABNORMAL LOW (ref 0.7–4.0)
MCH: 31.8 pg (ref 26.0–34.0)
MCHC: 34.9 g/dL (ref 30.0–36.0)
MCV: 90.9 fL (ref 80.0–100.0)
Monocytes Absolute: 0.4 10*3/uL (ref 0.1–1.0)
Monocytes Relative: 6 %
Neutro Abs: 5.3 10*3/uL (ref 1.7–7.7)
Neutrophils Relative %: 77 %
Platelet Count: 189 10*3/uL (ref 150–400)
RBC: 3.18 MIL/uL — ABNORMAL LOW (ref 3.87–5.11)
RDW: 18.8 % — ABNORMAL HIGH (ref 11.5–15.5)
WBC Count: 7 10*3/uL (ref 4.0–10.5)
nRBC: 0 % (ref 0.0–0.2)

## 2022-09-17 LAB — RAD ONC ARIA SESSION SUMMARY
Course Elapsed Days: 33
Plan Fractions Treated to Date: 22
Plan Fractions Treated to Date: 22
Plan Prescribed Dose Per Fraction: 1.8 Gy
Plan Prescribed Dose Per Fraction: 1.8 Gy
Plan Total Fractions Prescribed: 28
Plan Total Fractions Prescribed: 28
Plan Total Prescribed Dose: 50.4 Gy
Plan Total Prescribed Dose: 50.4 Gy
Reference Point Dosage Given to Date: 39.6 Gy
Reference Point Dosage Given to Date: 39.6 Gy
Reference Point Session Dosage Given: 1.8 Gy
Reference Point Session Dosage Given: 1.8 Gy
Session Number: 22

## 2022-09-17 LAB — SAMPLE TO BLOOD BANK

## 2022-09-18 ENCOUNTER — Ambulatory Visit
Admission: RE | Admit: 2022-09-18 | Discharge: 2022-09-18 | Disposition: A | Discharge status: Home / Self Care | Payer: Medicare HMO | Source: Ambulatory Visit | Attending: Radiation Oncology | Admitting: Radiation Oncology

## 2022-09-18 ENCOUNTER — Ambulatory Visit: Payer: Medicare HMO | Admitting: Radiation Oncology

## 2022-09-18 ENCOUNTER — Ambulatory Visit: Payer: Medicare HMO | Admitting: Physical Therapy

## 2022-09-18 ENCOUNTER — Other Ambulatory Visit: Payer: Self-pay

## 2022-09-18 DIAGNOSIS — C50212 Malignant neoplasm of upper-inner quadrant of left female breast: Secondary | ICD-10-CM | POA: Diagnosis not present

## 2022-09-18 LAB — RAD ONC ARIA SESSION SUMMARY
Course Elapsed Days: 34
Plan Fractions Treated to Date: 23
Plan Fractions Treated to Date: 23
Plan Prescribed Dose Per Fraction: 1.8 Gy
Plan Prescribed Dose Per Fraction: 1.8 Gy
Plan Total Fractions Prescribed: 28
Plan Total Fractions Prescribed: 28
Plan Total Prescribed Dose: 50.4 Gy
Plan Total Prescribed Dose: 50.4 Gy
Reference Point Dosage Given to Date: 41.4 Gy
Reference Point Dosage Given to Date: 41.4 Gy
Reference Point Session Dosage Given: 1.8 Gy
Reference Point Session Dosage Given: 1.8 Gy
Session Number: 23

## 2022-09-19 ENCOUNTER — Ambulatory Visit
Admission: RE | Admit: 2022-09-19 | Discharge: 2022-09-19 | Disposition: A | Discharge status: Home / Self Care | Payer: Medicare HMO | Source: Ambulatory Visit | Attending: Radiation Oncology | Admitting: Radiation Oncology

## 2022-09-19 ENCOUNTER — Other Ambulatory Visit: Payer: Self-pay

## 2022-09-19 DIAGNOSIS — C50212 Malignant neoplasm of upper-inner quadrant of left female breast: Secondary | ICD-10-CM | POA: Diagnosis not present

## 2022-09-19 LAB — RAD ONC ARIA SESSION SUMMARY
Course Elapsed Days: 35
Plan Fractions Treated to Date: 24
Plan Fractions Treated to Date: 24
Plan Prescribed Dose Per Fraction: 1.8 Gy
Plan Prescribed Dose Per Fraction: 1.8 Gy
Plan Total Fractions Prescribed: 28
Plan Total Fractions Prescribed: 28
Plan Total Prescribed Dose: 50.4 Gy
Plan Total Prescribed Dose: 50.4 Gy
Reference Point Dosage Given to Date: 43.2 Gy
Reference Point Dosage Given to Date: 43.2 Gy
Reference Point Session Dosage Given: 1.8 Gy
Reference Point Session Dosage Given: 1.8 Gy
Session Number: 24

## 2022-09-20 ENCOUNTER — Ambulatory Visit: Payer: Medicare HMO | Admitting: Physical Therapy

## 2022-09-20 ENCOUNTER — Ambulatory Visit
Admission: RE | Admit: 2022-09-20 | Discharge: 2022-09-20 | Disposition: A | Discharge status: Home / Self Care | Payer: Medicare HMO | Source: Ambulatory Visit | Attending: Radiation Oncology | Admitting: Radiation Oncology

## 2022-09-20 ENCOUNTER — Other Ambulatory Visit: Payer: Self-pay

## 2022-09-20 ENCOUNTER — Encounter: Payer: Self-pay | Admitting: Physical Therapy

## 2022-09-20 DIAGNOSIS — R293 Abnormal posture: Secondary | ICD-10-CM

## 2022-09-20 DIAGNOSIS — M25612 Stiffness of left shoulder, not elsewhere classified: Secondary | ICD-10-CM

## 2022-09-20 DIAGNOSIS — C50212 Malignant neoplasm of upper-inner quadrant of left female breast: Secondary | ICD-10-CM | POA: Diagnosis not present

## 2022-09-20 DIAGNOSIS — Z483 Aftercare following surgery for neoplasm: Secondary | ICD-10-CM

## 2022-09-20 DIAGNOSIS — Z170 Estrogen receptor positive status [ER+]: Secondary | ICD-10-CM

## 2022-09-20 LAB — RAD ONC ARIA SESSION SUMMARY
Course Elapsed Days: 36
Plan Fractions Treated to Date: 25
Plan Fractions Treated to Date: 25
Plan Prescribed Dose Per Fraction: 1.8 Gy
Plan Prescribed Dose Per Fraction: 1.8 Gy
Plan Total Fractions Prescribed: 28
Plan Total Fractions Prescribed: 28
Plan Total Prescribed Dose: 50.4 Gy
Plan Total Prescribed Dose: 50.4 Gy
Reference Point Dosage Given to Date: 45 Gy
Reference Point Dosage Given to Date: 45 Gy
Reference Point Session Dosage Given: 1.8 Gy
Reference Point Session Dosage Given: 1.8 Gy
Session Number: 25

## 2022-09-20 NOTE — Therapy (Signed)
OUTPATIENT PHYSICAL THERAPY BREAST CANCER TREATMENT   Patient Name: Cassandra Brennan MRN: [id number] DOB:[birth year], 66 y.o., female Today's Date: 09/20/2022   PT End of Session - 09/20/22 1154     Visit Number 9    Number of Visits 10    Date for PT Re-Evaluation 10/01/22    PT Start Time 79    PT Stop Time 1249    PT Time Calculation (min) 56 min    Activity Tolerance Patient tolerated treatment well    Behavior During Therapy WFL for tasks assessed/performed              Past Medical History:  Diagnosis Date   Acute dyspnea    Acute metabolic encephalopathy    Anxiety    Arthritis    right knee    Elevated troponin    Femoral DVT (deep venous thrombosis) (HCC)    GERD (gastroesophageal reflux disease)    Hyperlipidemia    Hypertension    Lactic acidosis    Obstructive cardiovascular shock ([place]) 03/22/2022   Pneumonia    Positive colorectal cancer screening using Cologuard test    Pulmonary embolism ([place])    03/22/22   Past Surgical History:  Procedure Laterality Date   BREAST CYST EXCISION Right    pt stated in [place] Left 07/10/2022   Procedure: LEFT BREAST LUMPECTOMY WITH RADIOACTIVE SEED AND LEFT  SENTINEL LYMPH NODE MAPPING;  Surgeon: Erroll Luna, MD;  Location: [place];  Service: General;  Laterality: Left;   BREAST SURGERY     other GI testing     unsure but had to drink a chalky drink    PORTACATH PLACEMENT N/A 11/27/2021   Procedure: INSERTION PORT-A-CATH;  Surgeon: Erroll Luna, MD;  [facility];  Service: General;  Laterality: N/A;   RADIOACTIVE SEED GUIDED AXILLARY SENTINEL LYMPH NODE Left 07/10/2022   Procedure: LEFT AXILLARY SEED LYMPH NODE BIOPSY;  Surgeon: Erroll Luna, MD;  Location: [place];  Service: General;  Laterality: Left;   TONSILLECTOMY     TUBAL LIGATION     Patient Active Problem List   Diagnosis Date Noted    Right leg DVT ([place]) 03/25/2022   Pneumonia 03/23/2022   AKI (acute kidney injury) ([place]) 03/23/2022   Acute massive pulmonary embolism ([place]) 03/22/2022   GERD (gastroesophageal reflux disease)    Chronic anemia    Cardiogenic shock ([place])    Anxiety 12/08/2021   Genetic testing 11/30/2021   Port-A-Cath in place 11/28/2021   Malignant neoplasm of upper-inner quadrant of left breast in female, estrogen receptor positive ([place]) 11/06/2021   Hypertension 03/15/2020    PCP: Everardo Beals, NP   REFERRING PROVIDER: Erroll Luna, MD   REFERRING DIAG: Left breast cancer  THERAPY DIAG:  Stiffness of left shoulder, not elsewhere classified  Aftercare following surgery for neoplasm  Abnormal posture  Malignant neoplasm of upper-inner quadrant of left breast in female, estrogen receptor positive ([place])  Rationale for Evaluation and Treatment Rehabilitation  ONSET DATE: 10/19/21  SUBJECTIVE:  SUBJECTIVE STATEMENT: I had a little bit of pain when they put my arm up for radiation. It might have been because I did not exercise over the holidays.   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 It measures 2 cm and is located in the upper inner quadrant. It is weakly ER positive, PR negative and HER2 negative with a Ki67 of 60%. L breast lumpectomy and SLNB on 07/10/22 (1/4), had blood clots in her lung  PATIENT GOALS:  Reassess how my recovery is going related to arm function, pain, and swelling.  PAIN:  Are you having pain? 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 but had to stop because it flares up her  hip   OBJECTIVE:   OBSERVATIONS:  Well healing lumpectomy and SLNB scars thought increased scar tissue palpable  POSTURE:  Forward head and rounded shoulders posture   UPPER EXTREMITY AROM/PROM:   A/PROM Right 11/08/2021 Left 11/08/2021 Left 08/02/22 08/08/2022 09/03/22 Left 09/20/22  Shoulder extension 51 55 68  64   Shoulder flexion 155 141 131 145 134 142  Shoulder abduction 150 135 105 134 120 151  Shoulder internal rotation 55 66 46  69   Shoulder external rotation 84 88 88                             (Blank rows = not tested)    LYMPHEDEMA ASSESSMENTS:    LANDMARK RIGHT 11/08/2021 RIGHT 10.12.23 LEFT 11/08/2021 LEFT 08/02/22  10 cm proximal to olecranon process 32 30.9 32.7 31  Olecranon process 27 27.5 27._0 cm proximal to ulnar styloid process 24.9 22.8 25.2 23.4  Just proximal to ulnar styloid process 17.8 17.4 17.5 17.1  Across hand at thumb web space 19.8 20.5 21.3 19.9  At base of 2nd digit 6.6 7 7.2 6.6  (Blank rows = not tested)    Surgery type/Date: L lumpectomy and SLNB 07/10/22 Number of lymph nodes removed: 1/4 Current/past treatment (chemo, radiation, hormone therapy): did neo adj chemo had to stop early due to blood clot, then will continue chemo pills and have radiation at the end of this month Other symptoms:  Heaviness/tightness No Pain No Pitting edema No Infections No Decreased scar mobility Yes Stemmer sign No  TODAY'S TREATMENT 09/20/22: Therapeutic Exs Pulleys into flexion and abduction x2 mins each with VCs to decrease Lt scapular compensation Ball roll up wall into end motions of flexion and Lt abduction x10 each Supine scapular strengthening series x 10 reps with yellow theraband as follow: narrow and wide grip flexion, horizontal abduction, diagonals, ER with therapist providing v/c for all and t/c for diagonals Manual Therapy P/ROM to Lt shoulder into flexion, abduction and D2 to pts available end motions  MFR to Lt axilla and  medial upper arm over areas of cording - tightness palpable in this area STM: gently over scar tissue at area of lumpectomy where tissue is firm to touch - no opening noted today 09/11/22: Therapeutic Exs Pulleys into flexion and abduction x2 mins each with VCs to decrease Lt scapular compensation Ball roll up wall into end motions of flexion and Lt abduction x10 each Modified downward dog on wall 5x, 5 sec each returning therapist demo Manual Therapy P/ROM to Lt  shoulder into flexion, abduction and D2 to pts available end motions and with scapular depression throughout MFR to Lt axilla and medial upper arm over areas of cording, 1 pop noted during stretching STM: gently over scar tissue at area of lumpectomy where tissue is firm to touch, this continued to soften however discontinued this during session as pts incision appeared to have small skin tear at ends of incision. Not sure if that was already there but showed pt with mirror areas of concern so she could be mindful of watching this over next few days.  Scap Mobs: Rt S/L for protraction and retraction to Lt scapula  09/05/22: Therapeutic Exs Pulleys into flexion and abduction x2 mins each with VCs to decrease Lt scapular compensation Ball roll up wall into end motions of flexion and Lt abduction x10 each Modified downward dog on wall 5x, 5 sec each returning therapist demo Manual Therapy P/ROM to Lt shoulder into flexion, abduction and D2 to pts available end motions and with scapular depression throughout MFR to Lt axilla and medial upper arm over areas of cording which seemed some improved today STM: gently over scar tissue at area of lumpectomy where tissue is firm to touch, this continued to soften some by end of session  09/03/22: Therapeutic Exs Pulleys into flexion and abduction x2 mins each with VCs to decrease Lt scapular compensation Ball roll up wall into end motions of flexion and Lt abduction x10 each returning therapist  demo A/ROM measurements taken, see above Manual Therapy P/ROM to Lt shoulder into flexion, abduction and D2 to pts available end motions and with scapular depression throughout, VCs to relax due to muscle guarding MFR to Lt axilla and medial upper arm over areas of cording  STM: gently over scar tissue at area of lumpectomy where tissue is firm to touch, this softened some by end of session and area didn't seem as large      PATIENT EDUCATION:  Education details: ABC class, scar mobilization, importance of exercise, use compression bra; supine scapular series (08/16/22) Person educated: Patient Education method: Explanation and Handouts Education comprehension: verbalized understanding   HOME EXERCISE PROGRAM:  Reviewed previously given post op HEP. Begin walking or using stationary bike  ASSESSMENT: CLINICAL IMPRESSION: Pts skin is still doing well so able to continue AA/ROM. Pt still has tightness in L axilla secondary to one cord that is deep in axilla. Continued with manual therapy to this area. Re instructed pt in supine scapular strengthening exercises as pt reports she has not done them since her visit and did not remember how to do them. Reissued handout and yellow band. Remeasured pt's L shoulder AROM and it has improved greatly since 09/02/22.   Pt will benefit from skilled therapeutic intervention to improve on the following deficits: Decreased knowledge of precautions, impaired UE functional use, pain, decreased ROM, postural dysfunction.   PT treatment/interventions: ADL/Self care home management, Therapeutic exercises, Therapeutic activity, Patient/Family education, Self Care, Joint mobilization, Orthotic/Fit training, Manual lymph drainage, Taping, Manual therapy, and Re-evaluation     GOALS: Goals reviewed with patient? Yes  LONG TERM GOALS:  (STG=LTG)  Updated:08/08/2022  GOALS Name Target Date  Goal status  1 Pt will demonstrate she has regained full shoulder  ROM and function post operatively compared to baselines.  Baseline: 10/01/2022 ONGOING  2 Pt will demonstrate 141 degrees of left shoulder flexion to allow her to reach overhead. Baseline: 131 10/01/2022 MET  3 Pt will demonstrate 135 degrees of L shoulder abduction  to allow her to reach out to the side. Baseline: 105 10/01/2022 MET  4 Pt will be independent in a home exercise program for continued stretching and strengthening. 10/01/2022 IN PROGRESS     PLAN: PT FREQUENCY/DURATION: 2x/wk for 4 wks  PLAN FOR NEXT SESSION:  How are supine scap exercises? How is skin at ends of incision? If skin okay cont manual therapy focusing on Lt axillary cording and end Lt shoulder P/ROM; be mindful of skin as pt still has a few weeks of radiation and her skin fragility may worsen  [employer], PT 09/20/2022, 1:04 PM   Over Head Pull: Narrow and Wide Grip   Cancer Rehab [phone]   On back, knees bent, feet flat, band across thighs, elbows straight but relaxed. Pull hands apart (start). Keeping elbows straight, bring arms up and over head, hands toward floor. Keep pull steady on band. Hold momentarily. Return slowly, keeping pull steady, back to start. Then do same with a wider grip on the band (past shoulder width) Repeat _5-10__ times. Band color __yellow____   Side Pull: Double Arm   On back, knees bent, feet flat. Arms perpendicular to body, shoulder level, elbows straight but relaxed. Pull arms out to sides, elbows straight. Resistance band comes across collarbones, hands toward floor. Hold momentarily. Slowly return to starting position. Repeat _5-10__ times. Band color _yellow____   Sword   On back, knees bent, feet flat, left hand on left hip, right hand above left. Pull right arm DIAGONALLY (hip to shoulder) across chest. Bring right arm along head toward floor. Hold momentarily. Slowly return to starting position. Repeat _5-10__ times. Do with left arm. Band color _yellow_____    Shoulder Rotation: Double Arm   On back, knees bent, feet flat, elbows tucked at sides, bent 90, hands palms up. Pull hands apart and down toward floor, keeping elbows near sides. Hold momentarily. Slowly return to starting position. Repeat _5-10__ times. Band color __yellow____

## 2022-09-20 NOTE — Patient Instructions (Signed)
Over Head Pull: Narrow and Wide Grip   Cancer Rehab [phone]   On back, knees bent, feet flat, band across thighs, elbows straight but relaxed. Pull hands apart (start). Keeping elbows straight, bring arms up and over head, hands toward floor. Keep pull steady on band. Hold momentarily. Return slowly, keeping pull steady, back to start. Then do same with a wider grip on the band (past shoulder width) Repeat _10__ times. Band color __yellow____   Side Pull: Double Arm   On back, knees bent, feet flat. Arms perpendicular to body, shoulder level, elbows straight but relaxed. Pull arms out to sides, elbows straight. Resistance band comes across collarbones, hands toward floor. Hold momentarily. Slowly return to starting position. Repeat _10__ times. Band color _yellow____   Sword   On back, knees bent, feet flat, left hand on left hip, right hand above left. Pull right arm DIAGONALLY (hip to shoulder) across chest. Bring right arm along head toward floor. Hold momentarily. Slowly return to starting position. Thumb is pointed down when by opposite hip and rotates backwards pointed towards floor when by head.  Repeat _10__ times. Do with left arm. Band color _yellow_____   Shoulder Rotation: Double Arm   On back, knees bent, feet flat, elbows tucked at sides, bent 90, hands palms up. Pull hands apart and down toward floor, keeping elbows near sides. Hold momentarily. Slowly return to starting position. Repeat _10__ times. Band color __yellow____

## 2022-09-21 ENCOUNTER — Other Ambulatory Visit: Payer: Self-pay

## 2022-09-21 ENCOUNTER — Ambulatory Visit
Admission: RE | Admit: 2022-09-21 | Discharge: 2022-09-21 | Disposition: A | Discharge status: Home / Self Care | Payer: Medicare HMO | Source: Ambulatory Visit | Attending: Radiation Oncology | Admitting: Radiation Oncology

## 2022-09-21 DIAGNOSIS — Z170 Estrogen receptor positive status [ER+]: Secondary | ICD-10-CM | POA: Insufficient documentation

## 2022-09-21 DIAGNOSIS — C50212 Malignant neoplasm of upper-inner quadrant of left female breast: Secondary | ICD-10-CM | POA: Insufficient documentation

## 2022-09-21 LAB — RAD ONC ARIA SESSION SUMMARY
Course Elapsed Days: 37
Plan Fractions Treated to Date: 26
Plan Fractions Treated to Date: 26
Plan Prescribed Dose Per Fraction: 1.8 Gy
Plan Prescribed Dose Per Fraction: 1.8 Gy
Plan Total Fractions Prescribed: 28
Plan Total Fractions Prescribed: 28
Plan Total Prescribed Dose: 50.4 Gy
Plan Total Prescribed Dose: 50.4 Gy
Reference Point Dosage Given to Date: 46.8 Gy
Reference Point Dosage Given to Date: 46.8 Gy
Reference Point Session Dosage Given: 1.8 Gy
Reference Point Session Dosage Given: 1.8 Gy
Session Number: 26

## 2022-09-23 NOTE — Progress Notes (Unsigned)
[place]   [phone] Fax:([phone]) [phone]   Clinic Follow up Note   Patient Care Team: Everardo Beals, NP as PCP - General Erroll Luna, MD as Consulting Physician (General Surgery) Truitt Merle, MD as Consulting Physician (Hematology) Gery Pray, MD as Consulting Physician (Radiation Oncology) [name], RN as Oncology Nurse Navigator [name], RN as Oncology Nurse Navigator 09/24/2022  CHIEF COMPLAINT: Follow up left breast cancer   SUMMARY OF ONCOLOGIC HISTORY: Oncology History Overview Note   Cancer Staging  Malignant neoplasm of upper-inner quadrant of left breast in female, estrogen receptor positive ([place]) Staging form: Breast, AJCC 8th Edition - Clinical stage from 10/27/2021: Stage IIB (cT1c, cN1, cM0, G3, ER+, PR-, HER2-) - Signed by [name], NP on 11/28/2021 - Pathologic stage from 07/10/2022: No Stage Recommended (ypT0, pN1, cM0, G3, ER+, PR-, HER2-) - Unsigned     Malignant neoplasm of upper-inner quadrant of left breast in female, estrogen receptor positive ([place])  10/19/2021 Mammogram   CLINICAL DATA:  Patient presents for palpable left axillary abnormality.   EXAM: DIGITAL DIAGNOSTIC BILATERAL MAMMOGRAM WITH TOMOSYNTHESIS AND CAD; ULTRASOUND LEFT BREAST LIMITED  IMPRESSION: Suspicious left breast mass 10 o'clock position.   Adjacent suspicious satellite nodule left breast 12 o'clock position.   Palpable mass left axilla corresponds with a large lymph node.   10/27/2021 Initial Biopsy   Diagnosis 1. Breast, left, needle core biopsy, 12 o'clock, ribbon - FIBROADENOMA - NO MALIGNANCY IDENTIFIED 2. Breast, left, needle core biopsy, 10 o'clock, coil - INVASIVE DUCTAL CARCINOMA WITH NECROSIS - DUCTAL CARCINOMA IN SITU - SEE COMMENT 3. Lymph node, needle/core biopsy, left axilla, tribell - INVASIVE DUCTAL CARCINOMA WITH NECROSIS - NO NODAL TISSUE IDENTIFIED - SEE COMMENT Microscopic Comment 2. and 2.  Based on the biopsy, the carcinoma appears Nottingham grade 3 of 3 and measures 0.8 cm in greatest linear extent.  3. PROGNOSTIC INDICATORS Results: The tumor cells are NEGATIVE for Her2 (1+). Estrogen Receptor: 30%, POSITIVE, WEAK STAINING INTENSITY Progesterone Receptor: 0%, NEGATIVE Proliferation Marker Ki67: 60%   10/27/2021 Cancer Staging   Staging form: Breast, AJCC 8th Edition - Clinical stage from 10/27/2021: Stage IIB (cT1c, cN1, cM0, G3, ER+, PR-, HER2-) - Signed by [name], NP on 11/28/2021 Stage prefix: Initial diagnosis Histologic grading system: 3 grade system   11/06/2021 Initial Diagnosis   Malignant neoplasm of upper-inner quadrant of left breast in female, estrogen receptor positive ([place])   11/15/2021 Breast MRI   IMPRESSION: 1.9 cm mass in the upper-inner quadrant of the left breast corresponding with the known invasive ductal carcinoma. Enlarged left axillary lymph node corresponding with known metastatic disease.   RECOMMENDATION: Treatment planning of the known left breast cancer and axillary metastasis is recommended.   Additional imaging evaluation of the mass in the liver is recommended with CT with liver mass protocol or MRI.   11/22/2021 Imaging   Bone scan IMPRESSION: No scintigraphic evidence of bony metastatic disease. Degenerative changes as above.   11/22/2021 Echocardiogram   Baseline echo LVEF 55-60%, normal GLS -21.7%   11/23/2021 Imaging   CT CAP IMPRESSION: 1. Bulky lymphadenopathy in the left axilla including a 5.0 x 2.8 cm lymph node. 2. 15 mm nodule identified in the upper inner quadrant of the left breast. 3. No suspicious pulmonary nodule or mass. No evidence for metastatic disease in the abdomen or pelvis. 4. 11 mm cyst in the dome of the left liver. Adjacent tiny hypoattenuating lesions in the right liver  are too small to characterize, but likely benign. Attention on follow-up recommended. 5. Prominent stool volume raises the question of  clinical constipation.   11/28/2021 - 03/20/2022 Chemotherapy   Patient is on Treatment Plan : BREAST ADJUVANT DOSE DENSE AC q14d / PACLitaxel q7d      Genetic Testing   [facility] CancerNext was Negative. Report date is 11/26/2021.   The CancerNext gene panel offered by [employer] includes sequencing, rearrangement analysis, and RNA analysis for the following 36 genes:   APC, ATM, AXIN2, BARD1, BMPR1A, BRCA1, BRCA2, BRIP1, CDH1, CDK4, CDKN2A, CHEK2, DICER1, HOXB13, EPCAM, GREM1, MLH1, MSH2, MSH3, MSH6, MUTYH, NBN, NF1, NTHL1, PALB2, PMS2, POLD1, POLE, PTEN, RAD51C, RAD51D, RECQL, SMAD4, SMARCA4, STK11, and TP53.    07/10/2022 Definitive Surgery   FINAL MICROSCOPIC DIAGNOSIS:   A. BREAST, LEFT, LUMPECTOMY:  - No residual carcinoma identified  - Treatment effect present (see comment)  - Ductal carcinoma in situ present, ypTis  - Margins negative for ductal carcinoma in situ  - See oncology table   B. BREAST, LEFT ADDITIONAL MEDIAL MARGIN, EXCISION:  - Negative for carcinoma   C. BREAST, LEFT ADDITIONAL LATERAL MARGIN, EXCISION:  - Negative for carcinoma   D. LYMPH NODE, LEFT AXILLARY, SENTINEL, EXCISION:  - Positive for carcinoma (1/1)   E. LYMPH NODE, LEFT AXILLARY, SENTINEL, EXCISION:  - Negative for carcinoma (0/1)   F. LYMPH NODE, LEFT, AXILLARY CONTENTS:  - Two lymph nodes identified  - Negative for carcinoma (0/2)    ADDENDUM:  Part D: Left axillary sentinel lymph node,   PROGNOSTIC INDICATOR RESULTS:  The tumor cells are NEGATIVE for Her2 (0).  Estrogen Receptor:       POSITIVE, 20%, WEAK STAINING  Progesterone Receptor:   POSITIVE, 2%, MODERATE STAINING      CURRENT THERAPY: Concurrent chemoRT with Xeloda 1500 mg BID M-F on days with radiation; starting 08/15/22   INTERVAL HISTORY: [name] returns for follow up as scheduled. Last seen by me 09/10/22. She continues ccRT with Xeloda.  She is tolerating treatment mostly well overall. She feels tired. Eating and  drinking well. Still a little queasy without N/V. Bowels moving well. Palms and feet are dark, no skin breakdown or pain. Left breast is dark, also without skin breakdown. Bowels moving. Denies fever, chills, cough, chest pain.   All other systems were reviewed with the patient and are negative.  MEDICAL HISTORY:  Past Medical History:  Diagnosis Date   Acute dyspnea    Acute metabolic encephalopathy    Anxiety    Arthritis    right knee    Elevated troponin    Femoral DVT (deep venous thrombosis) ([facility])    GERD (gastroesophageal reflux disease)    Hyperlipidemia    Hypertension    Lactic acidosis    Obstructive cardiovascular shock ([place]) 03/22/2022   Pneumonia    Positive colorectal cancer screening using Cologuard test    Pulmonary embolism ([place])    03/22/22    SURGICAL HISTORY: Past Surgical History:  Procedure Laterality Date   BREAST CYST EXCISION Right    pt stated in [facility] Left 07/10/2022   Procedure: LEFT BREAST LUMPECTOMY WITH RADIOACTIVE SEED AND LEFT  SENTINEL LYMPH NODE MAPPING;  Surgeon: Erroll Luna, MD;  Location: [facility];  Service: General;  Laterality: Left;   BREAST SURGERY     other GI testing     unsure but had to drink a  chalky drink    PORTACATH PLACEMENT N/A 11/27/2021   Procedure: INSERTION PORT-A-CATH;  Surgeon: Erroll Luna, MD;  Location: [facility];  Service: General;  Laterality: N/A;   RADIOACTIVE SEED GUIDED AXILLARY SENTINEL LYMPH NODE Left 07/10/2022   Procedure: LEFT AXILLARY SEED LYMPH NODE BIOPSY;  Surgeon: Erroll Luna, MD;  Location: [facility];  Service: General;  Laterality: Left;   TONSILLECTOMY     TUBAL LIGATION      I have reviewed the social history and family history with the patient and they are unchanged from previous note.  ALLERGIES:  is allergic to prednisone, rosuvastatin, other, robitussin (alcohol free)  [guaifenesin], and sulfa antibiotics.  MEDICATIONS:  Current Outpatient Medications  Medication Sig Dispense Refill   acetaminophen (TYLENOL) 500 MG tablet Take 500 mg by mouth every 8 (eight) hours as needed for moderate pain.     ALPRAZolam (XANAX) 0.25 MG tablet Take 1 tablet (0.25 mg total) by mouth daily as needed for anxiety. 30 tablet 0   apixaban (ELIQUIS) 5 MG TABS tablet TAKE 1 TABLET(5 MG) BY MOUTH TWICE DAILY 60 tablet 3   capecitabine (XELODA) 500 MG tablet Take 3 tablets (1,500 mg total) by mouth 2 (two) times daily after a meal. Take on days of radiation, Monday through Friday 99 tablet 1   lidocaine-prilocaine (EMLA) cream Apply to affected area once 30 g 3   LISINOPRIL PO Take 1 tablet by mouth daily.     metoprolol succinate (TOPROL-XL) 100 MG 24 hr tablet Take 100 mg by mouth daily. Take with or immediately following a meal.     Multiple Vitamins-Minerals (MULTIVITAMIN ADULTS) TABS Take 1 tablet by mouth daily.     ondansetron (ZOFRAN) 8 MG tablet Take 1 tablet (8 mg total) by mouth 2 (two) times daily as needed. Start on the third day after chemotherapy. (Patient taking differently: Take 8 mg by mouth 2 (two) times daily as needed for nausea or vomiting. Start on the third day after chemotherapy.) 30 tablet 1   potassium chloride (KLOR-CON) 10 MEQ tablet Take 10 mEq by mouth daily.     pravastatin (PRAVACHOL) 20 MG tablet Take 1 tablet (20 mg total) by mouth daily. 30 tablet 0   prochlorperazine (COMPAZINE) 10 MG tablet Take 1 tablet (10 mg total) by mouth every 6 (six) hours as needed (Nausea or vomiting). 30 tablet 1   No current facility-administered medications for this visit.    PHYSICAL EXAMINATION: ECOG PERFORMANCE STATUS: 1 - Symptomatic but completely ambulatory  Vitals:   09/24/22 1300  BP: 119/76  Pulse: (!) 103  Resp: 17  Temp: 98.3 F (36.8 C)  SpO2: 100%   Filed Weights   09/24/22 1300  Weight: 171 lb 14.4 oz (78 kg)    GENERAL:alert, no  distress and comfortable SKIN: palms with hyperpigmentation EYES: sclera clear LUNGS:  normal breathing effort  NEURO: alert & oriented x 3 with fluent speech, no focal motor/sensory deficits  LABORATORY DATA:  I have reviewed the data as listed    Latest Ref Rng & Units 09/24/2022   12:03 PM 09/17/2022    9:05 AM 09/10/2022    9:01 AM  CBC  WBC 4.0 - 10.5 K/uL 6.3  7.0  6.6   Hemoglobin 12.0 - 15.0 g/dL 10.3  10.1  10.6   Hematocrit 36.0 - 46.0 % 29.7  28.9  29.8   Platelets 150 - 400 K/uL 240  189  209  Latest Ref Rng & Units 09/24/2022   12:03 PM 09/17/2022    9:05 AM 09/10/2022    9:01 AM  CMP  Glucose 70 - 99 mg/dL 107  103  124   BUN 8 - 23 mg/dL _0 Creatinine 0.44 - 1.00 mg/dL 0.90  0.96  0.94   Sodium 135 - 145 mmol/L 141  141  142   Potassium 3.5 - 5.1 mmol/L 3.6  3.7  3.4   Chloride 98 - 111 mmol/L 104  105  105   CO2 22 - 32 mmol/L _1 Calcium 8.9 - 10.3 mg/dL 9.8  9.6  9.9   Total Protein 6.5 - 8.1 g/dL 7.4  7.0  7.2   Total Bilirubin 0.3 - 1.2 mg/dL 0.5  0.6  0.6   Alkaline Phos 38 - 126 U/L 92  86  85   AST 15 - 41 U/L _2 ALT 0 - 44 U/L _3 RADIOGRAPHIC STUDIES: I have personally reviewed the radiological images as listed and agreed with the findings in the report. No results found.   ASSESSMENT & PLAN: Cassandra Brennan is a 66 y.o. postmenopausal female    1. 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 B/l diagnostic MM and left [place] 10/19/21 showed: 2 cm mass at 10 o'clock; 5 mm indeterminate satellite mass at 12 o'clock; palpable left axilla mass corresponds with large lymph node. Biopsy 10/27/21 confirmed invasive ductal carcinoma with necrosis to both 10 o'clock and lymph node, grade 3. ER weakly positive (30%). -given her large positive lymph node, weak ER positivity, this is  similar to triple negative disease and predicts very high risk for recurrence after surgery.  Neoadjuvant chemotherapy has been recommended.  She was seen by Dr. Brantley Stage and will likely proceed with lumpectomy, SLNB and targeted lymph node dissection following chemo.  She would benefit from adjuvant radiation if she has lumpectomy. -breast MRI 11/15/2021 was previously reviewed, which shows the biopsy proven malignancy in the upper inner left breat measuring 1.5 x1.3 x1.9 cm, a biopsy proven malignant 4.3 cm left axillary LN and 2 other abnormal LNs measuring 1.3 and 1.5 cm, and a 1.3 liver mass likely representing a cyst.  -CT CAP 11/23/2021 was previously reviewed, which shows known left breast cancer and bulky axillary adenopathy, a liver cyst, and tiny indeterminate liver nodules too small to characterize. We will monitor these in the future. No evidence of distant metastasis.  -Bone scan is negative for osseous metastasis.  -Baseline echo is normal, LVEF 55-60%. -Left breast mass in the upper inner quadrant measured 2 cm and 2 palpable lymph nodes largest measuring 4.5 cm on 2/7 the day she began neoadjuvant chemo, for reference -She completed 4 cycles neoadjuvant AC and received 9 of 12 cycles of weekly taxol, stopped early due to massive PE. Breast mass was no longer palpable on chemo.  -She took anastrozole 04/17/22 - 06/22/22 while awaiting surgery, this was stopped due to arthralgia -S/p left lumpectomy 07/10/22 by Dr. Brantley Stage, path showed complete pathologic response in breast but 1/4 + LNs, repeat prognostic panel showed ER 20% weak, PR 2% moderate, and HER2 negative -She began concurrent chemo RT with Xeloda on 08/15/2022, tolerating well thus far.  The plan is  to continue Xeloda for 6 months after RT.  -Ms. Walls appears stable.  She continues concurrent chemo RT with Xeloda, tolerating treatment with fatigue, minor nausea, and mild hand/foot hyperpigmentation from Xeloda.  Side effects are  well-managed with supportive care at home.  She is able to continue functioning well. -Labs reviewed, adequate to continue Xeloda.  I recommend to continue oral potassium at least for now -Follow-up next week for the last week of chemo RT   2. Bilateral pulmonary emboli, at least submassive, with right heart strain, right femoral DVT -S/p cycle 9 taxol, admitted 04/11/22 s/p thrombolysis.  -likely secondary to underlying malignancy and chemo; taxol was discontinued -tolerating eliquis, will continue at least 6 months, possibly indefinitely  -no s/sx of recurrence   3.  Hypertension and anxiety -Continue medications. -Follow-up with primary care physician -Continue metoprolol and xanax   Plan: -Labs reviewed -Continue chemoRT with Xeloda 1500 mg twice daily on days with radiation -Lab and follow-up next week as scheduled   All questions were answered. The patient knows to call the clinic with any problems, questions or concerns. No barriers to learning were detected.     [name], NP 09/24/22

## 2022-09-24 ENCOUNTER — Other Ambulatory Visit: Payer: Self-pay

## 2022-09-24 ENCOUNTER — Encounter: Payer: Self-pay | Admitting: Nurse Practitioner

## 2022-09-24 ENCOUNTER — Other Ambulatory Visit: Payer: Medicare HMO

## 2022-09-24 ENCOUNTER — Inpatient Hospital Stay: Payer: Medicare HMO

## 2022-09-24 ENCOUNTER — Inpatient Hospital Stay (HOSPITAL_BASED_OUTPATIENT_CLINIC_OR_DEPARTMENT_OTHER): Payer: Medicare HMO | Admitting: Nurse Practitioner

## 2022-09-24 ENCOUNTER — Ambulatory Visit
Admission: RE | Admit: 2022-09-24 | Discharge: 2022-09-24 | Disposition: A | Discharge status: Home / Self Care | Payer: Medicare HMO | Source: Ambulatory Visit | Attending: Radiation Oncology | Admitting: Radiation Oncology

## 2022-09-24 VITALS — BP 119/76 | HR 103 | Temp 98.3°F | Resp 17 | Wt 171.9 lb

## 2022-09-24 DIAGNOSIS — I2699 Other pulmonary embolism without acute cor pulmonale: Secondary | ICD-10-CM | POA: Insufficient documentation

## 2022-09-24 DIAGNOSIS — Z170 Estrogen receptor positive status [ER+]: Secondary | ICD-10-CM | POA: Insufficient documentation

## 2022-09-24 DIAGNOSIS — Z86718 Personal history of other venous thrombosis and embolism: Secondary | ICD-10-CM | POA: Insufficient documentation

## 2022-09-24 DIAGNOSIS — Z882 Allergy status to sulfonamides status: Secondary | ICD-10-CM | POA: Insufficient documentation

## 2022-09-24 DIAGNOSIS — R5383 Other fatigue: Secondary | ICD-10-CM | POA: Insufficient documentation

## 2022-09-24 DIAGNOSIS — Z888 Allergy status to other drugs, medicaments and biological substances status: Secondary | ICD-10-CM | POA: Insufficient documentation

## 2022-09-24 DIAGNOSIS — M2550 Pain in unspecified joint: Secondary | ICD-10-CM | POA: Insufficient documentation

## 2022-09-24 DIAGNOSIS — Z86711 Personal history of pulmonary embolism: Secondary | ICD-10-CM | POA: Insufficient documentation

## 2022-09-24 DIAGNOSIS — I10 Essential (primary) hypertension: Secondary | ICD-10-CM | POA: Insufficient documentation

## 2022-09-24 DIAGNOSIS — C50212 Malignant neoplasm of upper-inner quadrant of left female breast: Secondary | ICD-10-CM

## 2022-09-24 DIAGNOSIS — R0609 Other forms of dyspnea: Secondary | ICD-10-CM | POA: Insufficient documentation

## 2022-09-24 DIAGNOSIS — Z79899 Other long term (current) drug therapy: Secondary | ICD-10-CM | POA: Insufficient documentation

## 2022-09-24 DIAGNOSIS — C773 Secondary and unspecified malignant neoplasm of axilla and upper limb lymph nodes: Secondary | ICD-10-CM | POA: Insufficient documentation

## 2022-09-24 DIAGNOSIS — Z923 Personal history of irradiation: Secondary | ICD-10-CM | POA: Insufficient documentation

## 2022-09-24 DIAGNOSIS — Z7901 Long term (current) use of anticoagulants: Secondary | ICD-10-CM | POA: Insufficient documentation

## 2022-09-24 LAB — CMP (CANCER CENTER ONLY)
ALT: 15 U/L (ref 0–44)
AST: 13 U/L — ABNORMAL LOW (ref 15–41)
Albumin: 4.4 g/dL (ref 3.5–5.0)
Alkaline Phosphatase: 92 U/L (ref 38–126)
Anion gap: 6 (ref 5–15)
BUN: 14 mg/dL (ref 8–23)
CO2: 31 mmol/L (ref 22–32)
Calcium: 9.8 mg/dL (ref 8.9–10.3)
Chloride: 104 mmol/L (ref 98–111)
Creatinine: 0.9 mg/dL (ref 0.44–1.00)
GFR, Estimated: >60 mL/min (ref >60)
Glucose, Bld: 107 mg/dL — ABNORMAL HIGH (ref 70–99)
Potassium: 3.6 mmol/L (ref 3.5–5.1)
Sodium: 141 mmol/L (ref 135–145)
Total Bilirubin: 0.5 mg/dL (ref 0.3–1.2)
Total Protein: 7.4 g/dL (ref 6.5–8.1)

## 2022-09-24 LAB — RAD ONC ARIA SESSION SUMMARY
Course Elapsed Days: 40
Plan Fractions Treated to Date: 27
Plan Fractions Treated to Date: 27
Plan Prescribed Dose Per Fraction: 1.8 Gy
Plan Prescribed Dose Per Fraction: 1.8 Gy
Plan Total Fractions Prescribed: 28
Plan Total Fractions Prescribed: 28
Plan Total Prescribed Dose: 50.4 Gy
Plan Total Prescribed Dose: 50.4 Gy
Reference Point Dosage Given to Date: 48.6 Gy
Reference Point Dosage Given to Date: 48.6 Gy
Reference Point Session Dosage Given: 1.8 Gy
Reference Point Session Dosage Given: 1.8 Gy
Session Number: 27

## 2022-09-24 LAB — CBC WITH DIFFERENTIAL (CANCER CENTER ONLY)
Abs Immature Granulocytes: 0.03 10*3/uL (ref 0.00–0.07)
Basophils Absolute: 0 10*3/uL (ref 0.0–0.1)
Basophils Relative: 0 %
Eosinophils Absolute: 0.7 10*3/uL — ABNORMAL HIGH (ref 0.0–0.5)
Eosinophils Relative: 11 %
HCT: 29.7 % — ABNORMAL LOW (ref 36.0–46.0)
Hemoglobin: 10.3 g/dL — ABNORMAL LOW (ref 12.0–15.0)
Immature Granulocytes: 1 %
Lymphocytes Relative: 9 %
Lymphs Abs: 0.5 10*3/uL — ABNORMAL LOW (ref 0.7–4.0)
MCH: 31.8 pg (ref 26.0–34.0)
MCHC: 34.7 g/dL (ref 30.0–36.0)
MCV: 91.7 fL (ref 80.0–100.0)
Monocytes Absolute: 0.3 10*3/uL (ref 0.1–1.0)
Monocytes Relative: 5 %
Neutro Abs: 4.7 10*3/uL (ref 1.7–7.7)
Neutrophils Relative %: 74 %
Platelet Count: 240 10*3/uL (ref 150–400)
RBC: 3.24 MIL/uL — ABNORMAL LOW (ref 3.87–5.11)
RDW: 19.4 % — ABNORMAL HIGH (ref 11.5–15.5)
WBC Count: 6.3 10*3/uL (ref 4.0–10.5)
nRBC: 0 % (ref 0.0–0.2)

## 2022-09-24 LAB — SAMPLE TO BLOOD BANK

## 2022-09-25 ENCOUNTER — Ambulatory Visit: Payer: Medicare HMO

## 2022-09-25 ENCOUNTER — Other Ambulatory Visit: Payer: Self-pay

## 2022-09-25 ENCOUNTER — Ambulatory Visit: Payer: Medicare HMO | Admitting: Radiation Oncology

## 2022-09-25 ENCOUNTER — Ambulatory Visit
Admission: RE | Admit: 2022-09-25 | Discharge: 2022-09-25 | Disposition: A | Discharge status: Home / Self Care | Payer: Medicare HMO | Source: Ambulatory Visit | Attending: Radiation Oncology | Admitting: Radiation Oncology

## 2022-09-25 ENCOUNTER — Ambulatory Visit: Payer: Medicare HMO | Attending: Surgery | Admitting: Physical Therapy

## 2022-09-25 ENCOUNTER — Encounter: Payer: Self-pay | Admitting: Physical Therapy

## 2022-09-25 DIAGNOSIS — Z483 Aftercare following surgery for neoplasm: Secondary | ICD-10-CM | POA: Diagnosis present

## 2022-09-25 DIAGNOSIS — R293 Abnormal posture: Secondary | ICD-10-CM | POA: Insufficient documentation

## 2022-09-25 DIAGNOSIS — C50212 Malignant neoplasm of upper-inner quadrant of left female breast: Secondary | ICD-10-CM | POA: Diagnosis present

## 2022-09-25 DIAGNOSIS — M25612 Stiffness of left shoulder, not elsewhere classified: Secondary | ICD-10-CM | POA: Insufficient documentation

## 2022-09-25 DIAGNOSIS — Z170 Estrogen receptor positive status [ER+]: Secondary | ICD-10-CM | POA: Diagnosis present

## 2022-09-25 LAB — RAD ONC ARIA SESSION SUMMARY
Course Elapsed Days: 41
Plan Fractions Treated to Date: 28
Plan Fractions Treated to Date: 28
Plan Prescribed Dose Per Fraction: 1.8 Gy
Plan Prescribed Dose Per Fraction: 1.8 Gy
Plan Total Fractions Prescribed: 28
Plan Total Fractions Prescribed: 28
Plan Total Prescribed Dose: 50.4 Gy
Plan Total Prescribed Dose: 50.4 Gy
Reference Point Dosage Given to Date: 50.4 Gy
Reference Point Dosage Given to Date: 50.4 Gy
Reference Point Session Dosage Given: 1.8 Gy
Reference Point Session Dosage Given: 1.8 Gy
Session Number: 28

## 2022-09-25 MED ORDER — RADIAPLEXRX EX GEL: Freq: Once | CUTANEOUS | Status: AC

## 2022-09-25 NOTE — Therapy (Signed)
OUTPATIENT PHYSICAL THERAPY BREAST CANCER TREATMENT   Patient Name: Cassandra Brennan MRN: [id number] DOB:06-08-1956, 66 y.o., female Today's Date: 09/25/2022   PT End of Session - 09/25/22 1151     Visit Number 10    Number of Visits 10    Date for PT Re-Evaluation 10/01/22    PT Start Time 1108    PT Stop Time 1152    PT Time Calculation (min) 44 min    Activity Tolerance Patient tolerated treatment well    Behavior During Therapy WFL for tasks assessed/performed               Past Medical History:  Diagnosis Date   Acute dyspnea    Acute metabolic encephalopathy    Anxiety    Arthritis    right knee    Elevated troponin    Femoral DVT (deep venous thrombosis) (HCC)    GERD (gastroesophageal reflux disease)    Hyperlipidemia    Hypertension    Lactic acidosis    Obstructive cardiovascular shock ([place]) 03/22/2022   Pneumonia    Positive colorectal cancer screening using Cologuard test    Pulmonary embolism ([place])    03/22/22   Past Surgical History:  Procedure Laterality Date   BREAST CYST EXCISION Right    pt stated in [place] Left 07/10/2022   Procedure: LEFT BREAST LUMPECTOMY WITH RADIOACTIVE SEED AND LEFT  SENTINEL LYMPH NODE MAPPING;  Surgeon: Erroll Luna, MD;  Location: [place];  Service: General;  Laterality: Left;   BREAST SURGERY     other GI testing     unsure but had to drink a chalky drink    PORTACATH PLACEMENT N/A 11/27/2021   Procedure: INSERTION PORT-A-CATH;  Surgeon: Erroll Luna, MD;  Location: [facility];  Service: General;  Laterality: N/A;   RADIOACTIVE SEED GUIDED AXILLARY SENTINEL LYMPH NODE Left 07/10/2022   Procedure: LEFT AXILLARY SEED LYMPH NODE BIOPSY;  Surgeon: Erroll Luna, MD;  Location: [place];  Service: General;  Laterality: Left;   TONSILLECTOMY     TUBAL LIGATION     Patient Active Problem List   Diagnosis Date Noted    Right leg DVT ([place]) 03/25/2022   Pneumonia 03/23/2022   AKI (acute kidney injury) ([place]) 03/23/2022   Acute massive pulmonary embolism ([place]) 03/22/2022   GERD (gastroesophageal reflux disease)    Chronic anemia    Cardiogenic shock ([place])    Anxiety 12/08/2021   Genetic testing 11/30/2021   Port-A-Cath in place 11/28/2021   Malignant neoplasm of upper-inner quadrant of left breast in female, estrogen receptor positive ([place]) 11/06/2021   Hypertension 03/15/2020    PCP: Everardo Beals, NP   REFERRING PROVIDER: Erroll Luna, MD   REFERRING DIAG: Left breast cancer  THERAPY DIAG:  Stiffness of left shoulder, not elsewhere classified  Aftercare following surgery for neoplasm  Abnormal posture  Malignant neoplasm of upper-inner quadrant of left breast in female, estrogen receptor positive ([place])  Rationale for Evaluation and Treatment Rehabilitation  ONSET DATE: 10/19/21  SUBJECTIVE:  SUBJECTIVE STATEMENT: I decided to use my stationary bike last night for 30 min and after that I went and sat down and felt like I was going to pass out. I threw up twice and the paramedics came and my vitals were fine.   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 It measures 2 cm and is located in the upper inner quadrant. It is weakly ER positive, PR negative and HER2 negative with a Ki67 of 60%. L breast lumpectomy and SLNB on 07/10/22 (1/4), had blood clots in her lung  PATIENT GOALS:  Reassess how my recovery is going related to arm function, pain, and swelling.  PAIN:  Are you having pain? No  PRECAUTIONS: Recent Surgery, left UE Lymphedema risk,   ACTIVITY LEVEL / LEISURE: she reports she tries to get on the treadmill but she  has arthritis in her hip but had to stop because it flares up her hip   OBJECTIVE:   OBSERVATIONS:  Well healing lumpectomy and SLNB scars thought increased scar tissue palpable  POSTURE:  Forward head and rounded shoulders posture   UPPER EXTREMITY AROM/PROM:   A/PROM Right 11/08/2021 Left 11/08/2021 Left 08/02/22 08/08/2022 09/03/22 Left 09/20/22 09/25/22  Shoulder extension 51 55 68  64    Shoulder flexion 155 141 131 145 134 142 136  Shoulder abduction 150 135 105 134 120 151 135  Shoulder internal rotation 55 66 46  69    Shoulder external rotation 84 88 88                              (Blank rows = not tested)    LYMPHEDEMA ASSESSMENTS:    LANDMARK RIGHT 11/08/2021 RIGHT 10.12.23 LEFT 11/08/2021 LEFT 08/02/22  10 cm proximal to olecranon process 32 30.9 32.7 31  Olecranon process 27 27.5 27._0 cm proximal to ulnar styloid process 24.9 22.8 25.2 23.4  Just proximal to ulnar styloid process 17.8 17.4 17.5 17.1  Across hand at thumb web space 19.8 20.5 21.3 19.9  At base of 2nd digit 6.6 7 7.2 6.6  (Blank rows = not tested)    Surgery type/Date: L lumpectomy and SLNB 07/10/22 Number of lymph nodes removed: 1/4 Current/past treatment (chemo, radiation, hormone therapy): did neo adj chemo had to stop early due to blood clot, then will continue chemo pills and have radiation at the end of this month Other symptoms:  Heaviness/tightness No Pain No Pitting edema No Infections No Decreased scar mobility Yes Stemmer sign No  TODAY'S TREATMENT 09/25/22: Therapeutic Exs Pulleys into flexion and abduction x2 mins each with VCs to decrease Lt scapular compensation Ball roll up wall into end motions of flexion and Lt abduction x10 each Supine scapular strengthening series x 10 reps with yellow theraband as follow: narrow and wide grip flexion, horizontal abduction, diagonals, ER with therapist providing v/c for all and t/c for diagonals Manual Therapy P/ROM to Lt  shoulder into flexion, abduction and D2 to pts available end motions  MFR to Lt axilla and medial upper arm over areas of cording - tightness palpable in this area STM: gently over scar tissue at SLNB scar to help decrease tightness in axilla 09/20/22: Therapeutic Exs Pulleys into flexion and abduction x2 mins each with VCs to decrease Lt scapular compensation Ball roll up wall into end motions of flexion and Lt abduction x10 each Manual Therapy  P/ROM to Lt shoulder into flexion, abduction, ER and D2 to pts available end motions  MFR to Lt axilla and medial upper arm over areas of cording - tightness palpable in this area and cording visible STM: gently over scar tissue at area of lumpectomy where tissue is firm to touch - no opening noted today 09/11/22: Therapeutic Exs Pulleys into flexion and abduction x2 mins each with VCs to decrease Lt scapular compensation Ball roll up wall into end motions of flexion and Lt abduction x10 each Modified downward dog on wall 5x, 5 sec each returning therapist demo Manual Therapy P/ROM to Lt shoulder into flexion, abduction and D2 to pts available end motions and with scapular depression throughout MFR to Lt axilla and medial upper arm over areas of cording, 1 pop noted during stretching STM: gently over scar tissue at area of lumpectomy where tissue is firm to touch, this continued to soften however discontinued this during session as pts incision appeared to have small skin tear at ends of incision. Not sure if that was already there but showed pt with mirror areas of concern so she could be mindful of watching this over next few days.  Scap Mobs: Rt S/L for protraction and retraction to Lt scapula  09/05/22: Therapeutic Exs Pulleys into flexion and abduction x2 mins each with VCs to decrease Lt scapular compensation Ball roll up wall into end motions of flexion and Lt abduction x10 each Modified downward dog on wall 5x, 5 sec each returning therapist  demo Manual Therapy P/ROM to Lt shoulder into flexion, abduction and D2 to pts available end motions and with scapular depression throughout MFR to Lt axilla and medial upper arm over areas of cording which seemed some improved today STM: gently over scar tissue at area of lumpectomy where tissue is firm to touch, this continued to soften some by end of session  09/03/22: Therapeutic Exs Pulleys into flexion and abduction x2 mins each with VCs to decrease Lt scapular compensation Ball roll up wall into end motions of flexion and Lt abduction x10 each returning therapist demo A/ROM measurements taken, see above Manual Therapy P/ROM to Lt shoulder into flexion, abduction and D2 to pts available end motions and with scapular depression throughout, VCs to relax due to muscle guarding MFR to Lt axilla and medial upper arm over areas of cording  STM: gently over scar tissue at area of lumpectomy where tissue is firm to touch, this softened some by end of session and area didn't seem as large      PATIENT EDUCATION:  Education details: ABC class, scar mobilization, importance of exercise, use compression bra; supine scapular series (08/16/22) Person educated: Patient Education method: Explanation and Handouts Education comprehension: verbalized understanding   HOME EXERCISE PROGRAM:  Reviewed previously given post op HEP. Begin walking or using stationary bike  ASSESSMENT: CLINICAL IMPRESSION: Pt's skin continues to be intact without increased redness from radiation. Continued with AAROM exercises today to pt's tolerance. Measured ROM at beginning of session and she is more tight today than she was last week. She reports she has been doing her theraband exercises 2x/wk so educated pt to do them 3x/wk. Continued with PROM to end range and MFR to cording to decrease tightness. STM focused over SLNB scar to help decrease tightness.   Pt will benefit from skilled therapeutic intervention to  improve on the following deficits: Decreased knowledge of precautions, impaired UE functional use, pain, decreased ROM, postural dysfunction.   PT treatment/interventions: ADL/Self  care home management, Therapeutic exercises, Therapeutic activity, Patient/Family education, Self Care, Joint mobilization, Orthotic/Fit training, Manual lymph drainage, Taping, Manual therapy, and Re-evaluation     GOALS: Goals reviewed with patient? Yes  LONG TERM GOALS:  (STG=LTG)  Updated:08/08/2022  GOALS Name Target Date  Goal status  1 Pt will demonstrate she has regained full shoulder ROM and function post operatively compared to baselines.  Baseline: 10/01/2022 ONGOING  2 Pt will demonstrate 141 degrees of left shoulder flexion to allow her to reach overhead. Baseline: 131 10/01/2022 MET  3 Pt will demonstrate 135 degrees of L shoulder abduction to allow her to reach out to the side. Baseline: 105 10/01/2022 MET  4 Pt will be independent in a home exercise program for continued stretching and strengthening. 10/01/2022 IN PROGRESS     PLAN: PT FREQUENCY/DURATION: 2x/wk for 4 wks  PLAN FOR NEXT SESSION:  UPDATE CERT-How are supine scap exercises? How is skin at ends of incision? If skin okay cont manual therapy focusing on Lt axillary cording and end Lt shoulder P/ROM; be mindful of skin as pt still has a few weeks of radiation and her skin fragility may worsen  [employer], PT 09/25/2022, 12:00 PM

## 2022-09-26 ENCOUNTER — Ambulatory Visit: Payer: Medicare HMO

## 2022-09-26 ENCOUNTER — Ambulatory Visit
Admission: RE | Admit: 2022-09-26 | Discharge: 2022-09-26 | Disposition: A | Discharge status: Home / Self Care | Payer: Medicare HMO | Source: Ambulatory Visit | Attending: Radiation Oncology | Admitting: Radiation Oncology

## 2022-09-26 ENCOUNTER — Other Ambulatory Visit: Payer: Self-pay

## 2022-09-26 DIAGNOSIS — C50212 Malignant neoplasm of upper-inner quadrant of left female breast: Secondary | ICD-10-CM | POA: Diagnosis not present

## 2022-09-26 LAB — RAD ONC ARIA SESSION SUMMARY
Course Elapsed Days: 42
Plan Fractions Treated to Date: 1
Plan Prescribed Dose Per Fraction: 2 Gy
Plan Total Fractions Prescribed: 5
Plan Total Prescribed Dose: 10 Gy
Reference Point Dosage Given to Date: 2 Gy
Reference Point Session Dosage Given: 2 Gy
Session Number: 29

## 2022-09-27 ENCOUNTER — Other Ambulatory Visit: Payer: Self-pay

## 2022-09-27 ENCOUNTER — Ambulatory Visit: Payer: Medicare HMO

## 2022-09-27 ENCOUNTER — Ambulatory Visit
Admission: RE | Admit: 2022-09-27 | Discharge: 2022-09-27 | Disposition: A | Discharge status: Home / Self Care | Payer: Medicare HMO | Source: Ambulatory Visit | Attending: Radiation Oncology | Admitting: Radiation Oncology

## 2022-09-27 ENCOUNTER — Ambulatory Visit: Payer: Medicare HMO | Admitting: Physical Therapy

## 2022-09-27 ENCOUNTER — Encounter: Payer: Self-pay | Admitting: Physical Therapy

## 2022-09-27 DIAGNOSIS — M25612 Stiffness of left shoulder, not elsewhere classified: Secondary | ICD-10-CM

## 2022-09-27 DIAGNOSIS — C50212 Malignant neoplasm of upper-inner quadrant of left female breast: Secondary | ICD-10-CM

## 2022-09-27 DIAGNOSIS — R293 Abnormal posture: Secondary | ICD-10-CM

## 2022-09-27 DIAGNOSIS — Z483 Aftercare following surgery for neoplasm: Secondary | ICD-10-CM

## 2022-09-27 LAB — RAD ONC ARIA SESSION SUMMARY
Course Elapsed Days: 43
Plan Fractions Treated to Date: 2
Plan Prescribed Dose Per Fraction: 2 Gy
Plan Total Fractions Prescribed: 5
Plan Total Prescribed Dose: 10 Gy
Reference Point Dosage Given to Date: 4 Gy
Reference Point Session Dosage Given: 2 Gy
Session Number: 30

## 2022-09-27 NOTE — Therapy (Signed)
OUTPATIENT PHYSICAL THERAPY BREAST CANCER TREATMENT   Patient Name: Cassandra Brennan MRN: [id number] DOB:11/15/1955, 66 y.o., female Today's Date: 09/27/2022   PT End of Session - 09/27/22 1412     Visit Number 11    Number of Visits 10    Date for PT Re-Evaluation 10/01/22    PT Start Time 1401    PT Stop Time 1454    PT Time Calculation (min) 53 min    Activity Tolerance Patient tolerated treatment well    Behavior During Therapy WFL for tasks assessed/performed                Past Medical History:  Diagnosis Date   Acute dyspnea    Acute metabolic encephalopathy    Anxiety    Arthritis    right knee    Elevated troponin    Femoral DVT (deep venous thrombosis) (HCC)    GERD (gastroesophageal reflux disease)    Hyperlipidemia    Hypertension    Lactic acidosis    Obstructive cardiovascular shock ([place]) 03/22/2022   Pneumonia    Positive colorectal cancer screening using Cologuard test    Pulmonary embolism ([place])    03/22/22   Past Surgical History:  Procedure Laterality Date   BREAST CYST EXCISION Right    pt stated in [place] Left 07/10/2022   Procedure: LEFT BREAST LUMPECTOMY WITH RADIOACTIVE SEED AND LEFT  SENTINEL LYMPH NODE MAPPING;  Surgeon: Erroll Luna, MD;  Location: [place];  Service: General;  Laterality: Left;   BREAST SURGERY     other GI testing     unsure but had to drink a chalky drink    PORTACATH PLACEMENT N/A 11/27/2021   Procedure: INSERTION PORT-A-CATH;  Surgeon: Erroll Luna, MD;  [facility];  Service: General;  Laterality: N/A;   RADIOACTIVE SEED GUIDED AXILLARY SENTINEL LYMPH NODE Left 07/10/2022   Procedure: LEFT AXILLARY SEED LYMPH NODE BIOPSY;  Surgeon: Erroll Luna, MD;  Location: [place];  Service: General;  Laterality: Left;   TONSILLECTOMY     TUBAL LIGATION     Patient Active Problem List   Diagnosis Date  Noted   Right leg DVT ([place]) 03/25/2022   Pneumonia 03/23/2022   AKI (acute kidney injury) ([place]) 03/23/2022   Acute massive pulmonary embolism ([place]) 03/22/2022   GERD (gastroesophageal reflux disease)    Chronic anemia    Cardiogenic shock ([place])    Anxiety 12/08/2021   Genetic testing 11/30/2021   Port-A-Cath in place 11/28/2021   Malignant neoplasm of upper-inner quadrant of left breast in female, estrogen receptor positive ([place]) 11/06/2021   Hypertension 03/15/2020    PCP: Everardo Beals, NP   REFERRING PROVIDER: Erroll Luna, MD   REFERRING DIAG: Left breast cancer  THERAPY DIAG:  Stiffness of left shoulder, not elsewhere classified  Aftercare following surgery for neoplasm  Abnormal posture  Malignant neoplasm of upper-inner quadrant of left breast in female, estrogen receptor positive ([place])  Rationale for Evaluation and Treatment Rehabilitation  ONSET DATE: 10/19/21  SUBJECTIVE:  SUBJECTIVE STATEMENT: My shoulder is feeling fine. It doesn't hurt.   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 It measures 2 cm and is located in the upper inner quadrant. It is weakly ER positive, PR negative and HER2 negative with a Ki67 of 60%. L breast lumpectomy and SLNB on 07/10/22 (1/4), had blood clots in her lung  PATIENT GOALS:  Reassess how my recovery is going related to arm function, pain, and swelling.  PAIN:  Are you having pain? 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 but had to stop because it flares up her hip   OBJECTIVE:   OBSERVATIONS:  Well healing lumpectomy and SLNB scars  thought increased scar tissue palpable  POSTURE:  Forward head and rounded shoulders posture   UPPER EXTREMITY AROM/PROM:   A/PROM Right 11/08/2021 Left 11/08/2021 Left 08/02/22 08/08/2022 09/03/22 Left 09/20/22 09/25/22 09/27/22  Shoulder extension 51 55 68  64     Shoulder flexion 155 141 131 145 134 142 136 155  Shoulder abduction 150 135 105 134 120 151 135 161  Shoulder internal rotation 55 66 46  69     Shoulder external rotation 84 88 88                               (Blank rows = not tested)    LYMPHEDEMA ASSESSMENTS:    LANDMARK RIGHT 11/08/2021 RIGHT 10.12.23 LEFT 11/08/2021 LEFT 08/02/22  10 cm proximal to olecranon process 32 30.9 32.7 31  Olecranon process 27 27.5 27._0 cm proximal to ulnar styloid process 24.9 22.8 25.2 23.4  Just proximal to ulnar styloid process 17.8 17.4 17.5 17.1  Across hand at thumb web space 19.8 20.5 21.3 19.9  At base of 2nd digit 6.6 7 7.2 6.6  (Blank rows = not tested)    Surgery type/Date: L lumpectomy and SLNB 07/10/22 Number of lymph nodes removed: 1/4 Current/past treatment (chemo, radiation, hormone therapy): did neo adj chemo had to stop early due to blood clot, then will continue chemo pills and have radiation at the end of this month Other symptoms:  Heaviness/tightness No Pain No Pitting edema No Infections No Decreased scar mobility Yes Stemmer sign No  TODAY'S TREATMENT 09/27/22: Therapeutic Exs Pulleys into flexion and abduction x2 mins each with VCs to decrease Lt scapular compensation Ball roll up wall into end motions of flexion and Lt abduction x10 each Supine scapular strengthening series x 10 reps with yellow theraband as follow: narrow and wide grip flexion, horizontal abduction, diagonals, ER with therapist providing v/c for all and t/c for diagonals Manual Therapy P/ROM to Lt shoulder into flexion, abduction and D2 to pts available end motions  MFR to Lt axilla and medial upper arm over areas of cording    09/25/22: Therapeutic Exs Pulleys into flexion and abduction x2 mins each with VCs to decrease Lt scapular compensation Ball roll up wall into end motions of flexion and Lt abduction x10 each Supine scapular strengthening series x 10 reps with yellow theraband as follow: narrow and wide grip flexion, horizontal abduction, diagonals, ER with therapist providing v/c for all and t/c for diagonals Manual Therapy P/ROM to Lt shoulder into flexion, abduction and D2 to pts available end motions  MFR to Lt axilla and medial upper arm over areas of cording - tightness palpable in this area STM: gently over scar tissue at SLNB scar to help decrease tightness in axilla 09/20/22: Therapeutic Exs Pulleys into flexion and abduction x2 mins each with VCs to decrease Lt scapular compensation Ball roll up wall into end motions of flexion and Lt abduction x10 each Manual Therapy P/ROM to Lt shoulder into flexion, abduction, ER and D2 to pts available end motions  MFR to Lt axilla and medial upper arm over areas of cording - tightness palpable in this area and cording visible STM: gently over scar tissue at area of lumpectomy where tissue is firm to touch - no opening noted today 09/11/22: Therapeutic Exs Pulleys into flexion and abduction x2 mins each with VCs to decrease Lt scapular compensation Ball roll up wall into end motions of flexion and Lt abduction x10 each Modified downward dog on wall 5x, 5 sec each returning therapist demo Manual Therapy P/ROM to Lt shoulder into flexion, abduction and D2 to pts available end motions and with scapular depression throughout MFR to Lt axilla and medial upper arm over areas of cording, 1 pop noted during stretching STM: gently over scar tissue at area of lumpectomy where tissue is firm to touch, this continued to soften however discontinued this during session as pts incision appeared to have small skin tear at ends of incision. Not sure if that was already there  but showed pt with mirror areas of concern so she could be mindful of watching this over next few days.  Scap Mobs: Rt S/L for protraction and retraction to Lt scapula  09/05/22: Therapeutic Exs Pulleys into flexion and abduction x2 mins each with VCs to decrease Lt scapular compensation Ball roll up wall into end motions of flexion and Lt abduction x10 each Modified downward dog on wall 5x, 5 sec each returning therapist demo Manual Therapy P/ROM to Lt shoulder into flexion, abduction and D2 to pts available end motions and with scapular depression throughout MFR to Lt axilla and medial upper arm over areas of cording which seemed some improved today STM: gently over scar tissue at area of lumpectomy where tissue is firm to touch, this continued to soften some by end of session  09/03/22: Therapeutic Exs Pulleys into flexion and abduction x2 mins each with VCs to decrease Lt scapular compensation Ball roll up wall into end motions of flexion and Lt abduction x10 each returning therapist demo A/ROM measurements taken, see above Manual Therapy P/ROM to Lt shoulder into flexion, abduction and D2 to pts available end motions and with scapular depression throughout, VCs to relax due to muscle guarding MFR to Lt axilla and medial upper arm over areas of cording  STM: gently over scar tissue at area of lumpectomy where tissue is firm to touch, this softened some by end of session and area didn't seem as large      PATIENT EDUCATION:  Education details: ABC class, scar mobilization, importance of exercise, use compression bra; supine scapular series (08/16/22) Person educated: Patient Education method: Explanation and Handouts Education comprehension: verbalized understanding   HOME EXERCISE PROGRAM:  Reviewed previously given post op HEP. Begin walking or using stationary bike  ASSESSMENT: CLINICAL IMPRESSION: Pt's skin continues to be intact without increased redness from radiation.  Reassessed pt's ROM and it has surpassed baseline. Pt is not having any pain and has no limitations with cording. She only has three more session of radiation left. Encouraged pt  to stretch daily and to continue to stretch for 6 months following radiation to maintain ROM. Pt will be discharged from skilled PT services at this time.   Pt will benefit from skilled therapeutic intervention to improve on the following deficits: Decreased knowledge of precautions, impaired UE functional use, pain, decreased ROM, postural dysfunction.   PT treatment/interventions: ADL/Self care home management, Therapeutic exercises, Therapeutic activity, Patient/Family education, Self Care, Joint mobilization, Orthotic/Fit training, Manual lymph drainage, Taping, Manual therapy, and Re-evaluation     GOALS: Goals reviewed with patient? Yes  LONG TERM GOALS:  (STG=LTG)  Updated:08/08/2022  GOALS Name Target Date  Goal status  1 Pt will demonstrate she has regained full shoulder ROM and function post operatively compared to baselines.  Baseline: 10/01/2022 MET  2 Pt will demonstrate 141 degrees of left shoulder flexion to allow her to reach overhead. Baseline: 131 10/01/2022 MET  3 Pt will demonstrate 135 degrees of L shoulder abduction to allow her to reach out to the side. Baseline: 105 10/01/2022 MET  4 Pt will be independent in a home exercise program for continued stretching and strengthening. 10/01/2022 MET     PLAN: PT FREQUENCY/DURATION: 2x/wk for 4 wks  PLAN FOR NEXT SESSION:  d/c this visit  Manus Gunning, PT 09/27/2022, 2:56 PM   PHYSICAL THERAPY DISCHARGE SUMMARY  Visits from Start of Care: 11  Current functional level related to goals / functional outcomes: All goals met   Remaining deficits: None   Education / Equipment: HEP, lymphedema education, cording education   Patient agrees to discharge. Patient goals were met. Patient is being discharged due to meeting the stated  rehab goals.  Allyson Sabal [place], [place] 09/27/22 2:56 PM

## 2022-09-28 ENCOUNTER — Other Ambulatory Visit: Payer: Self-pay

## 2022-09-28 ENCOUNTER — Ambulatory Visit
Admission: RE | Admit: 2022-09-28 | Discharge: 2022-09-28 | Disposition: A | Discharge status: Home / Self Care | Payer: Medicare HMO | Source: Ambulatory Visit | Attending: Radiation Oncology | Admitting: Radiation Oncology

## 2022-09-28 DIAGNOSIS — C50212 Malignant neoplasm of upper-inner quadrant of left female breast: Secondary | ICD-10-CM | POA: Diagnosis not present

## 2022-09-28 LAB — RAD ONC ARIA SESSION SUMMARY
Course Elapsed Days: 44
Plan Fractions Treated to Date: 3
Plan Prescribed Dose Per Fraction: 2 Gy
Plan Total Fractions Prescribed: 5
Plan Total Prescribed Dose: 10 Gy
Reference Point Dosage Given to Date: 6 Gy
Reference Point Session Dosage Given: 2 Gy
Session Number: 31

## 2022-09-28 NOTE — Progress Notes (Unsigned)
[place]   [phone] [phone]) [phone]   Clinic Follow up Note   Patient Care Team: Everardo Beals, NP as PCP - General Erroll Luna, MD as Consulting Physician (General Surgery) Truitt Merle, MD as Consulting Physician (Hematology) Gery Pray, MD as Consulting Physician (Radiation Oncology) [name], RN as Oncology Nurse Navigator [name], RN as Oncology Nurse Navigator  Date of Service:  09/28/2022  CHIEF COMPLAINT: f/u of left breast cancer   CURRENT THERAPY:  Concurrent chemoRT with Xeloda, starting 08/15/22             -Xeloda dose: 1536m BID M-F    ASSESSMENT: *** Cassandra OSTERBERGis a 66y.o. female with   No problem-specific Assessment & Plan notes found for this encounter.  ***   PLAN:  SUMMARY OF ONCOLOGIC HISTORY: Oncology History Overview Note   Cancer Staging  Malignant neoplasm of upper-inner quadrant of left breast in female, estrogen receptor positive ([facility] Staging form: Breast, AJCC 8th Edition - Clinical stage from 10/27/2021: Stage IIB (cT1c, cN1, cM0, G3, ER+, PR-, HER2-) - Signed by BAlla Feeling NP on 11/28/2021 - Pathologic stage from 07/10/2022: No Stage Recommended (ypT0, pN1, cM0, G3, ER+, PR-, HER2-) - Unsigned     Malignant neoplasm of upper-inner quadrant of left breast in female, estrogen receptor positive ([place]  10/19/2021 Mammogram   CLINICAL DATA:  Patient presents for palpable left axillary abnormality.   EXAM: DIGITAL DIAGNOSTIC BILATERAL MAMMOGRAM WITH TOMOSYNTHESIS AND CAD; ULTRASOUND LEFT BREAST LIMITED  IMPRESSION: Suspicious left breast mass 10 o'clock position.   Adjacent suspicious satellite nodule left breast 12 o'clock position.   Palpable mass left axilla corresponds with a large lymph node.   10/27/2021 Initial Biopsy   Diagnosis 1. Breast, left, needle core biopsy, 12 o'clock, ribbon - FIBROADENOMA - NO MALIGNANCY IDENTIFIED 2. Breast, left, needle core  biopsy, 10 o'clock, coil - INVASIVE DUCTAL CARCINOMA WITH NECROSIS - DUCTAL CARCINOMA IN SITU - SEE COMMENT 3. Lymph node, needle/core biopsy, left axilla, tribell - INVASIVE DUCTAL CARCINOMA WITH NECROSIS - NO NODAL TISSUE IDENTIFIED - SEE COMMENT Microscopic Comment 2. and 2. Based on the biopsy, the carcinoma appears Nottingham grade 3 of 3 and measures 0.8 cm in greatest linear extent.  3. PROGNOSTIC INDICATORS Results: The tumor cells are NEGATIVE for Her2 (1+). Estrogen Receptor: 30%, POSITIVE, WEAK STAINING INTENSITY Progesterone Receptor: 0%, NEGATIVE Proliferation Marker Ki67: 60%   10/27/2021 Cancer Staging   Staging form: Breast, AJCC 8th Edition - Clinical stage from 10/27/2021: Stage IIB (cT1c, cN1, cM0, G3, ER+, PR-, HER2-) - Signed by BAlla Feeling NP on 11/28/2021 Stage prefix: Initial diagnosis Histologic grading system: 3 grade system   11/06/2021 Initial Diagnosis   Malignant neoplasm of upper-inner quadrant of left breast in female, estrogen receptor positive ([facility]   11/15/2021 Breast MRI   IMPRESSION: 1.9 cm mass in the upper-inner quadrant of the left breast corresponding with the known invasive ductal carcinoma. Enlarged left axillary lymph node corresponding with known metastatic disease.   RECOMMENDATION: Treatment planning of the known left breast cancer and axillary metastasis is recommended.   Additional imaging evaluation of the mass in the liver is recommended with CT with liver mass protocol or MRI.   11/22/2021 Imaging   Bone scan IMPRESSION: No scintigraphic evidence of bony metastatic disease. Degenerative changes as above.   11/22/2021 Echocardiogram   Baseline echo LVEF 55-60%, normal GLS -21.7%   11/23/2021 Imaging   CT CAP IMPRESSION: 1. Bulky  lymphadenopathy in the left axilla including a 5.0 x 2.8 cm lymph node. 2. 15 mm nodule identified in the upper inner quadrant of the left breast. 3. No suspicious pulmonary nodule or mass. No evidence  for metastatic disease in the abdomen or pelvis. 4. 11 mm cyst in the dome of the left liver. Adjacent tiny hypoattenuating lesions in the right liver are too small to characterize, but likely benign. Attention on follow-up recommended. 5. Prominent stool volume raises the question of clinical constipation.   11/28/2021 - 03/20/2022 Chemotherapy   Patient is on Treatment Plan : BREAST ADJUVANT DOSE DENSE AC q14d / PACLitaxel q7d      Genetic Testing   [facility] CancerNext was Negative. Report date is 11/26/2021.   The CancerNext gene panel offered by [employer] includes sequencing, rearrangement analysis, and RNA analysis for the following 36 genes:   APC, ATM, AXIN2, BARD1, BMPR1A, BRCA1, BRCA2, BRIP1, CDH1, CDK4, CDKN2A, CHEK2, DICER1, HOXB13, EPCAM, GREM1, MLH1, MSH2, MSH3, MSH6, MUTYH, NBN, NF1, NTHL1, PALB2, PMS2, POLD1, POLE, PTEN, RAD51C, RAD51D, RECQL, SMAD4, SMARCA4, STK11, and TP53.    07/10/2022 Definitive Surgery   FINAL MICROSCOPIC DIAGNOSIS:   A. BREAST, LEFT, LUMPECTOMY:  - No residual carcinoma identified  - Treatment effect present (see comment)  - Ductal carcinoma in situ present, ypTis  - Margins negative for ductal carcinoma in situ  - See oncology table   B. BREAST, LEFT ADDITIONAL MEDIAL MARGIN, EXCISION:  - Negative for carcinoma   C. BREAST, LEFT ADDITIONAL LATERAL MARGIN, EXCISION:  - Negative for carcinoma   D. LYMPH NODE, LEFT AXILLARY, SENTINEL, EXCISION:  - Positive for carcinoma (1/1)   E. LYMPH NODE, LEFT AXILLARY, SENTINEL, EXCISION:  - Negative for carcinoma (0/1)   F. LYMPH NODE, LEFT, AXILLARY CONTENTS:  - Two lymph nodes identified  - Negative for carcinoma (0/2)    ADDENDUM:  Part D: Left axillary sentinel lymph node,   PROGNOSTIC INDICATOR RESULTS:  The tumor cells are NEGATIVE for Her2 (0).  Estrogen Receptor:       POSITIVE, 20%, WEAK STAINING  Progesterone Receptor:   POSITIVE, 2%, MODERATE STAINING       INTERVAL HISTORY:  *** Cassandra Brennan is here for a follow up of left breast cancer   She was last seen by me on 08/27/2022 She presents to the clinic      All other systems were reviewed with the patient and are negative.  MEDICAL HISTORY:  Past Medical History:  Diagnosis Date   Acute dyspnea    Acute metabolic encephalopathy    Anxiety    Arthritis    right knee    Elevated troponin    Femoral DVT (deep venous thrombosis) (HCC)    GERD (gastroesophageal reflux disease)    Hyperlipidemia    Hypertension    Lactic acidosis    Obstructive cardiovascular shock ([place]) 03/22/2022   Pneumonia    Positive colorectal cancer screening using Cologuard test    Pulmonary embolism ([place])    03/22/22    SURGICAL HISTORY: Past Surgical History:  Procedure Laterality Date   BREAST CYST EXCISION Right    pt stated in [place] Left 07/10/2022   Procedure: LEFT BREAST LUMPECTOMY WITH RADIOACTIVE SEED AND LEFT  SENTINEL LYMPH NODE MAPPING;  Surgeon: Erroll Luna, MD;  Location: [facility];  Service: General;  Laterality: Left;   BREAST SURGERY     other GI testing  unsure but had to drink a chalky drink    PORTACATH PLACEMENT N/A 11/27/2021   Procedure: INSERTION PORT-A-CATH;  Surgeon: Erroll Luna, MD;  Location: [facility];  Service: General;  Laterality: N/A;   RADIOACTIVE SEED GUIDED AXILLARY SENTINEL LYMPH NODE Left 07/10/2022   Procedure: LEFT AXILLARY SEED LYMPH NODE BIOPSY;  Surgeon: Erroll Luna, MD;  Location: [place];  Service: General;  Laterality: Left;   TONSILLECTOMY     TUBAL LIGATION      I have reviewed the social history and family history with the patient and they are unchanged from previous note.  ALLERGIES:  is allergic to prednisone, rosuvastatin, other, robitussin (alcohol free) [guaifenesin], and sulfa antibiotics.  MEDICATIONS:  Current Outpatient Medications  Medication Sig  Dispense Refill   acetaminophen (TYLENOL) 500 MG tablet Take 500 mg by mouth every 8 (eight) hours as needed for moderate pain.     ALPRAZolam (XANAX) 0.25 MG tablet Take 1 tablet (0.25 mg total) by mouth daily as needed for anxiety. 30 tablet 0   apixaban (ELIQUIS) 5 MG TABS tablet TAKE 1 TABLET(5 MG) BY MOUTH TWICE DAILY 60 tablet 3   capecitabine (XELODA) 500 MG tablet Take 3 tablets (1,500 mg total) by mouth 2 (two) times daily after a meal. Take on days of radiation, Monday through Friday 99 tablet 1   lidocaine-prilocaine (EMLA) cream Apply to affected area once 30 g 3   LISINOPRIL PO Take 1 tablet by mouth daily.     metoprolol succinate (TOPROL-XL) 100 MG 24 hr tablet Take 100 mg by mouth daily. Take with or immediately following a meal.     Multiple Vitamins-Minerals (MULTIVITAMIN ADULTS) TABS Take 1 tablet by mouth daily.     ondansetron (ZOFRAN) 8 MG tablet Take 1 tablet (8 mg total) by mouth 2 (two) times daily as needed. Start on the third day after chemotherapy. (Patient taking differently: Take 8 mg by mouth 2 (two) times daily as needed for nausea or vomiting. Start on the third day after chemotherapy.) 30 tablet 1   potassium chloride (KLOR-CON) 10 MEQ tablet Take 10 mEq by mouth daily.     pravastatin (PRAVACHOL) 20 MG tablet Take 1 tablet (20 mg total) by mouth daily. 30 tablet 0   prochlorperazine (COMPAZINE) 10 MG tablet Take 1 tablet (10 mg total) by mouth every 6 (six) hours as needed (Nausea or vomiting). 30 tablet 1   No current facility-administered medications for this visit.    PHYSICAL EXAMINATION: ECOG PERFORMANCE STATUS: {CHL ONC ECOG PS:[phone]}  There were no vitals filed for this visit. Wt Readings from Last 3 Encounters:  09/24/22 171 lb 14.4 oz (78 kg)  09/10/22 173 lb 8 oz (78.7 kg)  08/27/22 172 lb 6.4 oz (78.2 kg)    {Only keep what was examined. If exam not performed, can use .CEXAM } GENERAL:alert, no distress and comfortable SKIN: skin color,  texture, turgor are normal, no rashes or significant lesions EYES: normal, Conjunctiva are pink and non-injected, sclera clear {OROPHARYNX:no exudate, no erythema and lips, buccal mucosa, and tongue normal}  NECK: supple, thyroid normal size, non-tender, without nodularity LYMPH:  no palpable lymphadenopathy in the cervical, axillary {or inguinal} LUNGS: clear to auscultation and percussion with normal breathing effort HEART: regular rate & rhythm and no murmurs and no lower extremity edema ABDOMEN:abdomen soft, non-tender and normal bowel sounds Musculoskeletal:no cyanosis of digits and no clubbing  NEURO: alert & oriented x 3 with fluent speech, no focal motor/sensory deficits  LABORATORY DATA:  I have reviewed the data as listed    Latest Ref Rng & Units 09/24/2022   12:03 PM 09/17/2022    9:05 AM 09/10/2022    9:01 AM  CBC  WBC 4.0 - 10.5 K/uL 6.3  7.0  6.6   Hemoglobin 12.0 - 15.0 g/dL 10.3  10.1  10.6   Hematocrit 36.0 - 46.0 % 29.7  28.9  29.8   Platelets 150 - 400 K/uL 240  189  209         Latest Ref Rng & Units 09/24/2022   12:03 PM 09/17/2022    9:05 AM 09/10/2022    9:01 AM  CMP  Glucose 70 - 99 mg/dL 107  103  124   BUN 8 - 23 mg/dL _0 Creatinine 0.44 - 1.00 mg/dL 0.90  0.96  0.94   Sodium 135 - 145 mmol/L 141  141  142   Potassium 3.5 - 5.1 mmol/L 3.6  3.7  3.4   Chloride 98 - 111 mmol/L 104  105  105   CO2 22 - 32 mmol/L _1 Calcium 8.9 - 10.3 mg/dL 9.8  9.6  9.9   Total Protein 6.5 - 8.1 g/dL 7.4  7.0  7.2   Total Bilirubin 0.3 - 1.2 mg/dL 0.5  0.6  0.6   Alkaline Phos 38 - 126 U/L 92  86  85   AST 15 - 41 U/L _2 ALT 0 - 44 U/L _3 RADIOGRAPHIC STUDIES: I have personally reviewed the radiological images as listed and agreed with the findings in the report. No results found.    No orders of the defined types were placed in this encounter.  All questions were answered. The patient knows to call the clinic  with any problems, questions or concerns. No barriers to learning was detected. The total time spent in the appointment was {CHL ONC TIME VISIT - EXBMW:[id number]}.     Baldemar Friday, CMA 09/28/2022   I, Audry Riles, CMA, am acting as scribe for Truitt Merle, MD.   {Add scribe attestation statement}

## 2022-09-30 NOTE — Assessment & Plan Note (Signed)
-  diagnosed in 03/2022 when she was on neoadjuvant chemotherapy -Status post thrombolysis, on Eliquis, tolerating well. -willcontinue Eliquis until she completes chemoRT, or indefinitely

## 2022-09-30 NOTE — Assessment & Plan Note (Signed)
IDC, Stage IIB, c(T1c, N1), yp(T0, N1) ER 30% weak+, PR-/HER2-, Grade 3  -diagnosed in 10/2021 -s/p neoadjuvant chemo ACx4, followed by weekly taxol 01/22/22 - 03/20/22, discontinued due to hospitalization for massive PE and cardiogenic shock.  -she took anastrozole 04/17/22 - 06/22/22 due to delay in surgery from hospitalization, discontinued due to arthralgias. -s/p left lumpectomy on 07/10/22 under Dr. Brantley Stage, path showed complete pathologic response in breast but 1/4 positive lymph nodes. Repeat prognostic panel on the positive node showed ER 20% weak, PR 2% moderate, and Her2 negative. -she began concurrent chemoRT with Xeloda on 08/15/22. Plan to continue Xeloda for 6 months. -she is tolerating chemoRT well so far.  Will continue at same dose.

## 2022-10-01 ENCOUNTER — Ambulatory Visit: Payer: Medicare HMO

## 2022-10-01 ENCOUNTER — Other Ambulatory Visit: Payer: Self-pay

## 2022-10-01 ENCOUNTER — Inpatient Hospital Stay: Payer: Medicare HMO

## 2022-10-01 ENCOUNTER — Ambulatory Visit
Admission: RE | Admit: 2022-10-01 | Discharge: 2022-10-01 | Disposition: A | Discharge status: Home / Self Care | Payer: Medicare HMO | Source: Ambulatory Visit | Attending: Radiation Oncology | Admitting: Radiation Oncology

## 2022-10-01 ENCOUNTER — Encounter: Payer: Self-pay | Admitting: Hematology

## 2022-10-01 ENCOUNTER — Other Ambulatory Visit (HOSPITAL_COMMUNITY): Payer: Self-pay

## 2022-10-01 ENCOUNTER — Inpatient Hospital Stay (HOSPITAL_BASED_OUTPATIENT_CLINIC_OR_DEPARTMENT_OTHER): Payer: Medicare HMO | Admitting: Hematology

## 2022-10-01 VITALS — BP 124/79 | HR 87 | Temp 98.3°F | Resp 18 | Ht 65.0 in | Wt 172.4 lb

## 2022-10-01 DIAGNOSIS — I10 Essential (primary) hypertension: Secondary | ICD-10-CM

## 2022-10-01 DIAGNOSIS — C50212 Malignant neoplasm of upper-inner quadrant of left female breast: Secondary | ICD-10-CM

## 2022-10-01 DIAGNOSIS — Z170 Estrogen receptor positive status [ER+]: Secondary | ICD-10-CM

## 2022-10-01 DIAGNOSIS — I2699 Other pulmonary embolism without acute cor pulmonale: Secondary | ICD-10-CM

## 2022-10-01 LAB — CBC WITH DIFFERENTIAL (CANCER CENTER ONLY)
Abs Immature Granulocytes: 0.04 10*3/uL (ref 0.00–0.07)
Basophils Absolute: 0 10*3/uL (ref 0.0–0.1)
Basophils Relative: 0 %
Eosinophils Absolute: 0.7 10*3/uL — ABNORMAL HIGH (ref 0.0–0.5)
Eosinophils Relative: 11 %
HCT: 28.3 % — ABNORMAL LOW (ref 36.0–46.0)
Hemoglobin: 9.9 g/dL — ABNORMAL LOW (ref 12.0–15.0)
Immature Granulocytes: 1 %
Lymphocytes Relative: 10 %
Lymphs Abs: 0.7 10*3/uL (ref 0.7–4.0)
MCH: 32.5 pg (ref 26.0–34.0)
MCHC: 35 g/dL (ref 30.0–36.0)
MCV: 92.8 fL (ref 80.0–100.0)
Monocytes Absolute: 0.4 10*3/uL (ref 0.1–1.0)
Monocytes Relative: 5 %
Neutro Abs: 5 10*3/uL (ref 1.7–7.7)
Neutrophils Relative %: 73 %
Platelet Count: 216 10*3/uL (ref 150–400)
RBC: 3.05 MIL/uL — ABNORMAL LOW (ref 3.87–5.11)
RDW: 20.3 % — ABNORMAL HIGH (ref 11.5–15.5)
WBC Count: 6.8 10*3/uL (ref 4.0–10.5)
nRBC: 0 % (ref 0.0–0.2)

## 2022-10-01 LAB — RAD ONC ARIA SESSION SUMMARY
Course Elapsed Days: 47
Plan Fractions Treated to Date: 4
Plan Prescribed Dose Per Fraction: 2 Gy
Plan Total Fractions Prescribed: 5
Plan Total Prescribed Dose: 10 Gy
Reference Point Dosage Given to Date: 8 Gy
Reference Point Session Dosage Given: 2 Gy
Session Number: 32

## 2022-10-01 LAB — CMP (CANCER CENTER ONLY)
ALT: 14 U/L (ref 0–44)
AST: 13 U/L — ABNORMAL LOW (ref 15–41)
Albumin: 4.2 g/dL (ref 3.5–5.0)
Alkaline Phosphatase: 82 U/L (ref 38–126)
Anion gap: 6 (ref 5–15)
BUN: 16 mg/dL (ref 8–23)
CO2: 28 mmol/L (ref 22–32)
Calcium: 9.7 mg/dL (ref 8.9–10.3)
Chloride: 106 mmol/L (ref 98–111)
Creatinine: 1.02 mg/dL — ABNORMAL HIGH (ref 0.44–1.00)
GFR, Estimated: >60 mL/min (ref >60)
Glucose, Bld: 100 mg/dL — ABNORMAL HIGH (ref 70–99)
Potassium: 3.8 mmol/L (ref 3.5–5.1)
Sodium: 140 mmol/L (ref 135–145)
Total Bilirubin: 0.7 mg/dL (ref 0.3–1.2)
Total Protein: 6.9 g/dL (ref 6.5–8.1)

## 2022-10-01 LAB — SAMPLE TO BLOOD BANK

## 2022-10-01 MED ORDER — CAPECITABINE 500 MG PO TABS
ORAL_TABLET | ORAL | 1 refills | Status: DC
  Filled 2022-10-01: qty 98, fill #0
  Filled 2022-10-01 – 2022-10-05 (×2): qty 98, 21d supply, fill #0
  Filled 2022-10-29: qty 98, 21d supply, fill #1

## 2022-10-02 ENCOUNTER — Encounter: Payer: Self-pay | Admitting: *Deleted

## 2022-10-02 ENCOUNTER — Ambulatory Visit
Admission: RE | Admit: 2022-10-02 | Discharge: 2022-10-02 | Disposition: A | Discharge status: Home / Self Care | Payer: Medicare HMO | Source: Ambulatory Visit | Attending: Radiation Oncology | Admitting: Radiation Oncology

## 2022-10-02 ENCOUNTER — Encounter: Payer: Self-pay | Admitting: Radiation Oncology

## 2022-10-02 ENCOUNTER — Telehealth: Payer: Self-pay | Admitting: Hematology

## 2022-10-02 ENCOUNTER — Ambulatory Visit: Payer: Medicare HMO

## 2022-10-02 ENCOUNTER — Other Ambulatory Visit: Payer: Self-pay

## 2022-10-02 ENCOUNTER — Encounter: Payer: Medicare HMO | Admitting: Physical Therapy

## 2022-10-02 DIAGNOSIS — Z170 Estrogen receptor positive status [ER+]: Secondary | ICD-10-CM

## 2022-10-02 DIAGNOSIS — C50212 Malignant neoplasm of upper-inner quadrant of left female breast: Secondary | ICD-10-CM | POA: Diagnosis not present

## 2022-10-02 LAB — RAD ONC ARIA SESSION SUMMARY
Course Elapsed Days: 48
Plan Fractions Treated to Date: 5
Plan Prescribed Dose Per Fraction: 2 Gy
Plan Total Fractions Prescribed: 5
Plan Total Prescribed Dose: 10 Gy
Reference Point Dosage Given to Date: 10 Gy
Reference Point Session Dosage Given: 2 Gy
Session Number: 33

## 2022-10-02 MED ORDER — RADIAPLEXRX EX GEL: Freq: Once | CUTANEOUS | Status: AC

## 2022-10-02 NOTE — Telephone Encounter (Signed)
Spoke with patient confirming upcoming appointments  

## 2022-10-03 ENCOUNTER — Ambulatory Visit: Payer: Medicare HMO

## 2022-10-04 ENCOUNTER — Ambulatory Visit: Payer: Medicare HMO

## 2022-10-04 ENCOUNTER — Encounter: Payer: Medicare HMO | Admitting: Physical Therapy

## 2022-10-05 ENCOUNTER — Other Ambulatory Visit: Payer: Self-pay

## 2022-10-05 ENCOUNTER — Other Ambulatory Visit (HOSPITAL_COMMUNITY): Payer: Self-pay

## 2022-10-08 ENCOUNTER — Ambulatory Visit: Payer: Medicare HMO

## 2022-10-08 VITALS — Wt 171.4 lb

## 2022-10-08 DIAGNOSIS — Z483 Aftercare following surgery for neoplasm: Secondary | ICD-10-CM

## 2022-10-08 NOTE — Therapy (Signed)
OUTPATIENT PHYSICAL THERAPY SOZO SCREENING NOTE   Patient Name: Cassandra Brennan MRN: [id number] DOB:1956-05-14, 66 y.o., female Today's Date: 10/08/2022  PCP: Everardo Beals, NP REFERRING PROVIDER: Erroll Luna, MD   PT End of Session - 10/08/22 1450     Visit Number 11   # unchanged due to screen only   PT Start Time 60    PT Stop Time 1452    PT Time Calculation (min) 6 min    Activity Tolerance Patient tolerated treatment well    Behavior During Therapy WFL for tasks assessed/performed             Past Medical History:  Diagnosis Date   Acute dyspnea    Acute metabolic encephalopathy    Anxiety    Arthritis    right knee    Elevated troponin    Femoral DVT (deep venous thrombosis) (HCC)    GERD (gastroesophageal reflux disease)    Hyperlipidemia    Hypertension    Lactic acidosis    Obstructive cardiovascular shock ([facility]) 03/22/2022   Pneumonia    Positive colorectal cancer screening using Cologuard test    Pulmonary embolism ([place])    03/22/22   Past Surgical History:  Procedure Laterality Date   BREAST CYST EXCISION Right    pt stated in [place] Left 07/10/2022   Procedure: LEFT BREAST LUMPECTOMY WITH RADIOACTIVE SEED AND LEFT  SENTINEL [place];  Surgeon: Erroll Luna, MD;  Location: [facility];  Service: General;  Laterality: Left;   BREAST SURGERY     other GI testing     unsure but had to drink a chalky drink    PORTACATH PLACEMENT N/A 11/27/2021   Procedure: INSERTION PORT-A-CATH;  Surgeon: Erroll Luna, MD;  [facility];  Service: General;  Laterality: N/A;   RADIOACTIVE SEED GUIDED AXILLARY SENTINEL LYMPH NODE Left 07/10/2022   Procedure: LEFT AXILLARY SEED LYMPH NODE BIOPSY;  Surgeon: Erroll Luna, MD;  Location: [facility];  Service: General;  Laterality: Left;   TONSILLECTOMY     TUBAL LIGATION     Patient Active  Problem List   Diagnosis Date Noted   Right leg DVT ([place]) 03/25/2022   Pneumonia 03/23/2022   AKI (acute kidney injury) ([place]) 03/23/2022   Acute massive pulmonary embolism ([place]) 03/22/2022   GERD (gastroesophageal reflux disease)    Chronic anemia    Cardiogenic shock ([place])    Anxiety 12/08/2021   Genetic testing 11/30/2021   Port-A-Cath in place 11/28/2021   Malignant neoplasm of upper-inner quadrant of left breast in female, estrogen receptor positive ([place]) 11/06/2021   Hypertension 03/15/2020    REFERRING DIAG: left breast cancer at risk for lymphedema  THERAPY DIAG:  Aftercare following surgery for neoplasm  PERTINENT HISTORY: Patient was diagnosed on 10/19/2022 with left grade III invasive ductal carcinoma breast cancer. It measures 2 cm and is located in the upper inner quadrant. It is weakly ER positive, PR negative and HER2 negative with a Ki67 of 60%. L breast lumpectomy and SLNB on 07/10/22 (1/4), had blood clots in her lung   PRECAUTIONS: left UE Lymphedema risk, None  SUBJECTIVE: Pt returns for her first 3 month L-Dex screen.   PAIN:  Are you having pain? No  SOZO SCREENING: Patient was assessed today using the SOZO machine to determine the lymphedema index score. This was compared to her baseline score. It was determined that she is  within the recommended range when compared to her baseline and no further action is needed at this time. She will continue SOZO screenings. These are done every 3 months for 2 years post operatively followed by every 6 months for 2 years, and then annually.   L-DEX FLOWSHEETS - 10/08/22 1400       L-DEX LYMPHEDEMA SCREENING   Measurement Type Unilateral    L-DEX MEASUREMENT EXTREMITY Upper Extremity    POSITION  Standing    DOMINANT SIDE Right    At Risk Side Left    BASELINE SCORE (UNILATERAL) 9.6    L-DEX SCORE (UNILATERAL) 7.1    VALUE CHANGE (UNILAT) -2.5               Cassandra Brennan, PTA 10/08/2022, 2:51  PM

## 2022-10-09 ENCOUNTER — Other Ambulatory Visit: Payer: Self-pay | Admitting: Nurse Practitioner

## 2022-10-09 ENCOUNTER — Other Ambulatory Visit: Payer: Self-pay

## 2022-10-10 ENCOUNTER — Other Ambulatory Visit (HOSPITAL_COMMUNITY): Payer: Self-pay

## 2022-10-28 NOTE — Assessment & Plan Note (Signed)
IDC, Stage IIB, c(T1c, N1), yp(T0, N1) ER 30% weak+, PR-/HER2-, Grade 3  -diagnosed in 10/2021 -s/p neoadjuvant chemo ACx4, followed by weekly taxol 01/22/22 - 03/20/22, discontinued due to hospitalization for massive PE and cardiogenic shock.  -she took anastrozole 04/17/22 - 06/22/22 due to delay in surgery from hospitalization, discontinued due to arthralgias. -s/p left lumpectomy on 07/10/22 under Dr. Brantley Stage, path showed complete pathologic response in breast but 1/4 positive lymph nodes. Repeat prognostic panel on the positive node showed ER 20% weak, PR 2% moderate, and Her2 negative. -she completed concurrent chemoRT with Xeloda on 10/02/2022 -continue adjuvant Xeloda for 6 months

## 2022-10-28 NOTE — Assessment & Plan Note (Signed)
-  diagnosed in 03/2022 when she was on neoadjuvant chemotherapy -Status post thrombolysis, on Eliquis, tolerating well. -willcontinue Eliquis until she completes chemoRT, or indefinitely

## 2022-10-29 ENCOUNTER — Other Ambulatory Visit: Payer: Self-pay

## 2022-10-29 ENCOUNTER — Other Ambulatory Visit (HOSPITAL_COMMUNITY): Payer: Self-pay

## 2022-10-29 ENCOUNTER — Inpatient Hospital Stay: Payer: Medicare HMO | Attending: Hematology

## 2022-10-29 ENCOUNTER — Encounter: Payer: Self-pay | Admitting: Hematology

## 2022-10-29 ENCOUNTER — Inpatient Hospital Stay: Payer: Medicare HMO | Admitting: Hematology

## 2022-10-29 VITALS — BP 123/70 | HR 90 | Temp 98.4°F | Resp 18 | Ht 65.0 in | Wt 171.4 lb

## 2022-10-29 DIAGNOSIS — Z170 Estrogen receptor positive status [ER+]: Secondary | ICD-10-CM

## 2022-10-29 DIAGNOSIS — Z86711 Personal history of pulmonary embolism: Secondary | ICD-10-CM | POA: Diagnosis not present

## 2022-10-29 DIAGNOSIS — Z95828 Presence of other vascular implants and grafts: Secondary | ICD-10-CM

## 2022-10-29 DIAGNOSIS — C50212 Malignant neoplasm of upper-inner quadrant of left female breast: Secondary | ICD-10-CM

## 2022-10-29 DIAGNOSIS — Z79899 Other long term (current) drug therapy: Secondary | ICD-10-CM | POA: Insufficient documentation

## 2022-10-29 DIAGNOSIS — Z7901 Long term (current) use of anticoagulants: Secondary | ICD-10-CM | POA: Insufficient documentation

## 2022-10-29 DIAGNOSIS — Z86718 Personal history of other venous thrombosis and embolism: Secondary | ICD-10-CM | POA: Insufficient documentation

## 2022-10-29 DIAGNOSIS — Z882 Allergy status to sulfonamides status: Secondary | ICD-10-CM | POA: Diagnosis not present

## 2022-10-29 DIAGNOSIS — I2699 Other pulmonary embolism without acute cor pulmonale: Secondary | ICD-10-CM

## 2022-10-29 DIAGNOSIS — Z888 Allergy status to other drugs, medicaments and biological substances status: Secondary | ICD-10-CM | POA: Diagnosis not present

## 2022-10-29 LAB — CMP (CANCER CENTER ONLY)
ALT: 19 U/L (ref 0–44)
AST: 17 U/L (ref 15–41)
Albumin: 4.6 g/dL (ref 3.5–5.0)
Alkaline Phosphatase: 83 U/L (ref 38–126)
Anion gap: 8 (ref 5–15)
BUN: 18 mg/dL (ref 8–23)
CO2: 28 mmol/L (ref 22–32)
Calcium: 9.5 mg/dL (ref 8.9–10.3)
Chloride: 104 mmol/L (ref 98–111)
Creatinine: 0.91 mg/dL (ref 0.44–1.00)
GFR, Estimated: >60 mL/min (ref >60)
Glucose, Bld: 93 mg/dL (ref 70–99)
Potassium: 3.4 mmol/L — ABNORMAL LOW (ref 3.5–5.1)
Sodium: 140 mmol/L (ref 135–145)
Total Bilirubin: 0.9 mg/dL (ref 0.3–1.2)
Total Protein: 6.7 g/dL (ref 6.5–8.1)

## 2022-10-29 LAB — CBC WITH DIFFERENTIAL (CANCER CENTER ONLY)
Abs Immature Granulocytes: 0.01 10*3/uL (ref 0.00–0.07)
Basophils Absolute: 0 10*3/uL (ref 0.0–0.1)
Basophils Relative: 0 %
Eosinophils Absolute: 0.6 10*3/uL — ABNORMAL HIGH (ref 0.0–0.5)
Eosinophils Relative: 12 %
HCT: 25.1 % — ABNORMAL LOW (ref 36.0–46.0)
Hemoglobin: 9 g/dL — ABNORMAL LOW (ref 12.0–15.0)
Immature Granulocytes: 0 %
Lymphocytes Relative: 12 %
Lymphs Abs: 0.7 10*3/uL (ref 0.7–4.0)
MCH: 33.1 pg (ref 26.0–34.0)
MCHC: 35.9 g/dL (ref 30.0–36.0)
MCV: 92.3 fL (ref 80.0–100.0)
Monocytes Absolute: 0.3 10*3/uL (ref 0.1–1.0)
Monocytes Relative: 5 %
Neutro Abs: 3.9 10*3/uL (ref 1.7–7.7)
Neutrophils Relative %: 71 %
Platelet Count: 259 10*3/uL (ref 150–400)
RBC: 2.72 MIL/uL — ABNORMAL LOW (ref 3.87–5.11)
RDW: 18.7 % — ABNORMAL HIGH (ref 11.5–15.5)
WBC Count: 5.5 10*3/uL (ref 4.0–10.5)
nRBC: 0 % (ref 0.0–0.2)

## 2022-10-29 LAB — SAMPLE TO BLOOD BANK

## 2022-10-29 MED ORDER — HEPARIN SOD (PORK) LOCK FLUSH 100 UNIT/ML IV SOLN
500.0000 [IU] | Freq: Once | INTRAVENOUS | Status: AC
  Administered 2022-10-29: 500 [IU]

## 2022-10-29 MED ORDER — CAPECITABINE 500 MG PO TABS
ORAL_TABLET | ORAL | 1 refills | Status: DC
  Filled 2022-10-29 – 2022-10-31 (×2): qty 98, 21d supply, fill #0
  Filled 2022-11-15: qty 98, 21d supply, fill #1

## 2022-10-29 MED ORDER — SODIUM CHLORIDE 0.9% FLUSH
10.0000 mL | Freq: Once | INTRAVENOUS | Status: AC
  Administered 2022-10-29: 10 mL

## 2022-10-29 NOTE — Progress Notes (Incomplete)
Cassandra Brennan is here today for follow up post radiation to the breast.   Breast Side:left breast   They completed their radiation on:    Does the patient complain of any of the following: Post radiation skin issues: *** Breast Tenderness: *** Breast Swelling: *** Lymphadema: *** Range of Motion limitations: *** Fatigue post radiation: *** Appetite good/fair/poor: ***  Additional comments if applicable:

## 2022-10-29 NOTE — Progress Notes (Signed)
[facility]   [phone] [phone]   Clinic Follow up Note   Patient Care Team: Everardo Beals, NP as PCP - General Erroll Luna, MD as Consulting Physician (General Surgery) Truitt Merle, MD as Consulting Physician (Hematology) Gery Pray, MD as Consulting Physician (Radiation Oncology) [name], RN as Oncology Nurse Navigator [name], RN as Oncology Nurse Navigator  Date of Service:  10/29/2022  CHIEF COMPLAINT: f/u of left breast cancer     CURRENT THERAPY:  Adjuvant Xeloda, started 08/15/22             -Xeloda dose: '1500mg'$  am and '2000mg'$  pm, day 1-14 every 21 days   ASSESSMENT:  Cassandra Brennan is a 67 y.o. female with   Malignant neoplasm of upper-inner quadrant of left breast in female, estrogen receptor positive ([place])  IDC, Stage IIB, c(T1c, N1), yp(T0, N1) ER 30% weak+, PR-/HER2-, Grade 3  -diagnosed in 10/2021 -s/p neoadjuvant chemo ACx4, followed by weekly taxol 01/22/22 - 03/20/22, discontinued due to hospitalization for massive PE and cardiogenic shock.  -she took anastrozole 04/17/22 - 06/22/22 due to delay in surgery from hospitalization, discontinued due to arthralgias. -s/p left lumpectomy on 07/10/22 under Dr. Brantley Stage, path showed complete pathologic response in breast but 1/4 positive lymph nodes. Repeat prognostic panel on the positive node showed ER 20% weak, PR 2% moderate, and Her2 negative. -she completed concurrent chemoRT with Xeloda on 10/02/2022 -continue adjuvant Xeloda for 6 months   history of massive pulmonary embolism ([place]) -diagnosed in 03/2022 when she was on neoadjuvant chemotherapy -Status post thrombolysis, on Eliquis, tolerating well. -willcontinue Eliquis until she completes chemoRT, or indefinitely      PLAN:  -labs reviewed  -start next cycle Xeloda after eye surgery on 1/18  -f/u in 4 weeks.  SUMMARY OF ONCOLOGIC HISTORY: Oncology History Overview Note   Cancer Staging   Malignant neoplasm of upper-inner quadrant of left breast in female, estrogen receptor positive ([place]) Staging form: Breast, AJCC 8th Edition - Clinical stage from 10/27/2021: Stage IIB (cT1c, cN1, cM0, G3, ER+, PR-, HER2-) - Signed by [name], NP on 11/28/2021 - Pathologic stage from 07/10/2022: No Stage Recommended (ypT0, pN1, cM0, G3, ER+, PR-, HER2-) - Unsigned     Malignant neoplasm of upper-inner quadrant of left breast in female, estrogen receptor positive ([place])  10/19/2021 Mammogram   CLINICAL DATA:  Patient presents for palpable left axillary abnormality.   EXAM: DIGITAL DIAGNOSTIC BILATERAL MAMMOGRAM WITH TOMOSYNTHESIS AND CAD; ULTRASOUND LEFT BREAST LIMITED  IMPRESSION: Suspicious left breast mass 10 o'clock position.   Adjacent suspicious satellite nodule left breast 12 o'clock position.   Palpable mass left axilla corresponds with a large lymph node.   10/27/2021 Initial Biopsy   Diagnosis 1. Breast, left, needle core biopsy, 12 o'clock, ribbon - FIBROADENOMA - NO MALIGNANCY IDENTIFIED 2. Breast, left, needle core biopsy, 10 o'clock, coil - INVASIVE DUCTAL CARCINOMA WITH NECROSIS - DUCTAL CARCINOMA IN SITU - SEE COMMENT 3. Lymph node, needle/core biopsy, left axilla, tribell - INVASIVE DUCTAL CARCINOMA WITH NECROSIS - NO NODAL TISSUE IDENTIFIED - SEE COMMENT Microscopic Comment 2. and 2. Based on the biopsy, the carcinoma appears Nottingham grade 3 of 3 and measures 0.8 cm in greatest linear extent.  3. PROGNOSTIC INDICATORS Results: The tumor cells are NEGATIVE for Her2 (1+). Estrogen Receptor: 30%, POSITIVE, WEAK STAINING INTENSITY Progesterone Receptor: 0%, NEGATIVE Proliferation Marker Ki67: 60%   10/27/2021 Cancer Staging   Staging form: Breast, AJCC 8th Edition - Clinical  stage from 10/27/2021: Stage IIB (cT1c, cN1, cM0, G3, ER+, PR-, HER2-) - Signed by [name], NP on 11/28/2021 Stage prefix: Initial diagnosis Histologic grading system: 3  grade system   11/06/2021 Initial Diagnosis   Malignant neoplasm of upper-inner quadrant of left breast in female, estrogen receptor positive ([place])   11/15/2021 Breast MRI   IMPRESSION: 1.9 cm mass in the upper-inner quadrant of the left breast corresponding with the known invasive ductal carcinoma. Enlarged left axillary lymph node corresponding with known metastatic disease.   RECOMMENDATION: Treatment planning of the known left breast cancer and axillary metastasis is recommended.   Additional imaging evaluation of the mass in the liver is recommended with CT with liver mass protocol or MRI.   11/22/2021 Imaging   Bone scan IMPRESSION: No scintigraphic evidence of bony metastatic disease. Degenerative changes as above.   11/22/2021 Echocardiogram   Baseline echo LVEF 55-60%, normal GLS -21.7%   11/23/2021 Imaging   CT CAP IMPRESSION: 1. Bulky lymphadenopathy in the left axilla including a 5.0 x 2.8 cm lymph node. 2. 15 mm nodule identified in the upper inner quadrant of the left breast. 3. No suspicious pulmonary nodule or mass. No evidence for metastatic disease in the abdomen or pelvis. 4. 11 mm cyst in the dome of the left liver. Adjacent tiny hypoattenuating lesions in the right liver are too small to characterize, but likely benign. Attention on follow-up recommended. 5. Prominent stool volume raises the question of clinical constipation.   11/28/2021 - 03/20/2022 Chemotherapy   Patient is on Treatment Plan : BREAST ADJUVANT DOSE DENSE AC q14d / PACLitaxel q7d      Genetic Testing   [facility] CancerNext was Negative. Report date is 11/26/2021.   The CancerNext gene panel offered by [employer] includes sequencing, rearrangement analysis, and RNA analysis for the following 36 genes:   APC, ATM, AXIN2, BARD1, BMPR1A, BRCA1, BRCA2, BRIP1, CDH1, CDK4, CDKN2A, CHEK2, DICER1, HOXB13, EPCAM, GREM1, MLH1, MSH2, MSH3, MSH6, MUTYH, NBN, NF1, NTHL1, PALB2, PMS2, POLD1, POLE, PTEN, RAD51C, RAD51D,  RECQL, SMAD4, SMARCA4, STK11, and TP53.    07/10/2022 Definitive Surgery   FINAL MICROSCOPIC DIAGNOSIS:   A. BREAST, LEFT, LUMPECTOMY:  - No residual carcinoma identified  - Treatment effect present (see comment)  - Ductal carcinoma in situ present, ypTis  - Margins negative for ductal carcinoma in situ  - See oncology table   B. BREAST, LEFT ADDITIONAL MEDIAL MARGIN, EXCISION:  - Negative for carcinoma   C. BREAST, LEFT ADDITIONAL LATERAL MARGIN, EXCISION:  - Negative for carcinoma   D. LYMPH NODE, LEFT AXILLARY, SENTINEL, EXCISION:  - Positive for carcinoma (1/1)   E. LYMPH NODE, LEFT AXILLARY, SENTINEL, EXCISION:  - Negative for carcinoma (0/1)   F. LYMPH NODE, LEFT, AXILLARY CONTENTS:  - Two lymph nodes identified  - Negative for carcinoma (0/2)    ADDENDUM:  Part D: Left axillary sentinel lymph node,   PROGNOSTIC INDICATOR RESULTS:  The tumor cells are NEGATIVE for Her2 (0).  Estrogen Receptor:       POSITIVE, 20%, WEAK STAINING  Progesterone Receptor:   POSITIVE, 2%, MODERATE STAINING        HISTORY:  Cassandra Brennan is here for a follow up of  left breast cancer  She was last seen by me on 10/01/22 She presents to the clinic alone. Pt states she has no problem with Xeloda. Pt denies having diarrhea, a little nausea. Pt reports of having eye surgery.  All other systems were reviewed with the patient and are negative.  MEDICAL HISTORY:  Past Medical History:  Diagnosis Date   Acute dyspnea    Acute metabolic encephalopathy    Anxiety    Arthritis    right knee    Elevated troponin    Femoral DVT (deep venous thrombosis) ([facility])    GERD (gastroesophageal reflux disease)    Hyperlipidemia    Hypertension    Lactic acidosis    Obstructive cardiovascular shock ([place]) 03/22/2022   Pneumonia    Positive colorectal cancer screening using Cologuard test    Pulmonary embolism ([place])    03/22/22    SURGICAL HISTORY: Past Surgical History:  Procedure  Laterality Date   BREAST CYST EXCISION Right    pt stated in [facility] Left 07/10/2022   Procedure: LEFT BREAST LUMPECTOMY WITH RADIOACTIVE SEED AND LEFT  SENTINEL LYMPH NODE MAPPING;  Surgeon: Erroll Luna, MD;  Location: [facility];  Service: General;  Laterality: Left;   BREAST SURGERY     other GI testing     unsure but had to drink a chalky drink    PORTACATH PLACEMENT N/A 11/27/2021   Procedure: INSERTION PORT-A-CATH;  Surgeon: Erroll Luna, MD;  Location: [facility];  Service: General;  Laterality: N/A;   RADIOACTIVE SEED GUIDED AXILLARY SENTINEL LYMPH NODE Left 07/10/2022   Procedure: LEFT AXILLARY SEED LYMPH NODE BIOPSY;  Surgeon: Erroll Luna, MD;  Location: [facility];  Service: General;  Laterality: Left;   TONSILLECTOMY     TUBAL LIGATION      I have reviewed the social history and family history with the patient and they are unchanged from previous note.  ALLERGIES:  is allergic to prednisone, rosuvastatin, other, robitussin (alcohol free) [guaifenesin], and sulfa antibiotics.  MEDICATIONS:  Current Outpatient Medications  Medication Sig Dispense Refill   acetaminophen (TYLENOL) 500 MG tablet Take 500 mg by mouth every 8 (eight) hours as needed for moderate pain.     ALPRAZolam (XANAX) 0.25 MG tablet Take 1 tablet (0.25 mg total) by mouth daily as needed for anxiety. 30 tablet 0   apixaban (ELIQUIS) 5 MG TABS tablet TAKE 1 TABLET(5 MG) BY MOUTH TWICE DAILY 60 tablet 3   capecitabine (XELODA) 500 MG tablet Take 3 tablets by mouth in morning and 4 tablets in evening, every 10-12 hours, for 14 days then off for 7 days. Take after meals. 98 tablet 1   lidocaine-prilocaine (EMLA) cream Apply to affected area once 30 g 3   LISINOPRIL PO Take 1 tablet by mouth daily.     metoprolol succinate (TOPROL-XL) 100 MG 24 hr tablet Take 100 mg by mouth daily. Take with or immediately  following a meal.     Multiple Vitamins-Minerals (MULTIVITAMIN ADULTS) TABS Take 1 tablet by mouth daily.     ondansetron (ZOFRAN) 8 MG tablet Take 1 tablet (8 mg total) by mouth 2 (two) times daily as needed. Start on the third day after chemotherapy. (Patient taking differently: Take 8 mg by mouth 2 (two) times daily as needed for nausea or vomiting. Start on the third day after chemotherapy.) 30 tablet 1   potassium chloride (KLOR-CON) 10 MEQ tablet Take 10 mEq by mouth daily.     pravastatin (PRAVACHOL) 20 MG tablet Take 1 tablet (20 mg total) by mouth daily. 30 tablet 0   prochlorperazine (COMPAZINE) 10 MG tablet Take 1 tablet (10 mg total)  by mouth every 6 (six) hours as needed (Nausea or vomiting). 30 tablet 1   No current facility-administered medications for this visit.    PHYSICAL EXAMINATION: ECOG PERFORMANCE STATUS: 0 - Asymptomatic  Vitals:   10/29/22 1401  BP: 123/70  Pulse: 90  Resp: 18  Temp: 98.4 F (36.9 C)  SpO2: 100%   Wt Readings from Last 3 Encounters:  10/29/22 171 lb 6.4 oz (77.7 kg)  10/08/22 171 lb 6 oz (77.7 kg)  10/01/22 172 lb 6.4 oz (78.2 kg)     GENERAL:alert, no distress and comfortable SKIN: skin color normal, no rashes or significant lesions EYES: normal, Conjunctiva are pink and non-injected, sclera clear  NEURO: alert & oriented x 3 with fluent speech  LABORATORY DATA:  I have reviewed the data as listed    Latest Ref Rng & Units 10/29/2022    1:32 PM 10/01/2022   12:24 PM 09/24/2022   12:03 PM  CBC  WBC 4.0 - 10.5 K/uL 5.5  6.8  6.3   Hemoglobin 12.0 - 15.0 g/dL 9.0  9.9  10.3   Hematocrit 36.0 - 46.0 % 25.1  28.3  29.7   Platelets 150 - 400 K/uL 259  216  240         Latest Ref Rng & Units 10/29/2022    1:32 PM 10/01/2022   12:24 PM 09/24/2022   12:03 PM  CMP  Glucose 70 - 99 mg/dL 93  100  107   BUN 8 - 23 mg/dL '18  16  14   '$ Creatinine 0.44 - 1.00 mg/dL 0.91  1.02  0.90   Sodium 135 - 145 mmol/L 140  140  141   Potassium 3.5 -  5.1 mmol/L 3.4  3.8  3.6   Chloride 98 - 111 mmol/L 104  106  104   CO2 22 - 32 mmol/L '28  28  31   '$ Calcium 8.9 - 10.3 mg/dL 9.5  9.7  9.8   Total Protein 6.5 - 8.1 g/dL 6.7  6.9  7.4   Total Bilirubin 0.3 - 1.2 mg/dL 0.9  0.7  0.5   Alkaline Phos 38 - 126 U/L 83  82  92   AST 15 - 41 U/L '17  13  13   '$ ALT 0 - 44 U/L '19  14  15       '$ RADIOGRAPHIC STUDIES: I have personally reviewed the radiological images as listed and agreed with the findings in the report. No results found.    No orders of the defined types were placed in this encounter.  All questions were answered. The patient knows to call the clinic with any problems, questions or concerns. No barriers to learning was detected. The total time spent in the appointment was 30 minutes.     Truitt Merle, MD 10/29/2022   Felicity Coyer, CMA, am acting as scribe for Truitt Merle, MD.   I have reviewed the above documentation for accuracy and completeness, and I agree with the above.

## 2022-10-30 ENCOUNTER — Telehealth: Payer: Self-pay | Admitting: Hematology

## 2022-10-30 ENCOUNTER — Other Ambulatory Visit: Payer: Self-pay | Admitting: Nurse Practitioner

## 2022-10-30 ENCOUNTER — Other Ambulatory Visit: Payer: Self-pay

## 2022-10-30 NOTE — Radiation Completion Notes (Signed)
Patient Name: Cassandra Brennan, Cassandra Brennan MRN: [id number] Date of Birth: [birth year] Referring Physician: Everardo Beals, M.D. Date of Service: 2022-10-30 Radiation Oncologist: Teryl Lucy, M.D. [place]                             Radiation Oncology End of Treatment Note     Diagnosis: C50.212 Malignant neoplasm of upper-inner quadrant of left female breast Staging on 2022-07-10: Malignant neoplasm of upper-inner quadrant of left breast in female, estrogen receptor positive ([place]) T=pT0, N=pN1, M=cM0 Staging on 2021-10-27: Malignant neoplasm of upper-inner quadrant of left breast in female, estrogen receptor positive ([place]) T=cT1c, N=cN1, M=cM0 Intent: Curative     ==========DELIVERED PLANS==========  First Treatment Date: 2022-08-15 - Last Treatment Date: 2022-10-02   Plan Name: Breast_L_BH Site: Breast, Left Technique: 3D Mode: Photon Dose Per Fraction: 1.8 Gy Prescribed Dose (Delivered / Prescribed): 50.4 Gy / 50.4 Gy Prescribed Fxs (Delivered / Prescribed): 28 / 28   Plan Name: Brst_L_SCV_BH Site: Breast, Left Technique: 3D Mode: Photon Dose Per Fraction: 1.8 Gy Prescribed Dose (Delivered / Prescribed): 50.4 Gy / 50.4 Gy Prescribed Fxs (Delivered / Prescribed): 28 / 28   Plan Name: Brst_L_Bst_BH Site: Breast, Left Technique: 3D Mode: Photon Dose Per Fraction: 2 Gy Prescribed Dose (Delivered / Prescribed): 10 Gy / 10 Gy Prescribed Fxs (Delivered / Prescribed): 5 / 5     ==========ON TREATMENT VISIT DATES========== 2022-08-22, 2022-08-28, 2022-09-04, 2022-09-10, 2022-09-18, 2022-09-25, 2022-10-02     ==========UPCOMING VISITS========== 2024-01-15T16:45:00Z [facility] Everardo Beals, NP; Gery Pray, MD        ==========APPENDIX - ON TREATMENT VISIT NOTES==========   PatEd 2022-08-21 Ongoing education performed.   ImpPlan 2022-08-21 The patient is tolerating radiation. Continue  treatment as planned.   PhysExam 2022-08-21 Alert, no acute distress.   PatEd 2022-08-22 Ongoing education performed.   ImpPlan 2022-08-22 The patient is tolerating radiation. Continue treatment as planned.   PhysExam 2022-08-22 Alert, no acute distress.   ProgNote 2022-08-22 Need refills: [  ] Additional  Weekly Progress Notes [ Patient reports tolerating treatment well. Reports mild discomfort to left breast at times.  ]    RunningNotes 2022-08-22 Education, alra and radiaplex provided on 08/22/22- NS   PatEd 2022-08-28 Ongoing education performed.   ImpPlan 2022-08-28 The patient is tolerating radiation. Continue treatment as planned.   PhysExam 2022-08-28 Alert, no acute distress.   ProgNote 2022-08-28 Changes from last week/visit? [ No ] Pain? [ Yes- sharp, shooting pain ] Fatigue? [ No ] Skin irritation? [ No ] Using lotions? [ No ] Lymphedema? [ Yes ] Issues with ROM? [ No ] Need refills: [ No ] Additional  Weekly Progress Notes [  ]    PatEd 2022-09-04 Ongoing education performed.   ImpPlan 2022-09-04 The patient is tolerating radiation. Continue treatment as planned.   PhysExam 2022-09-04 Alert, no acute distress.   ProgNote 2022-09-04 Changes from last week/visit? [ No ] Pain? [ No - she does report having some aching under her left arm last week ] Fatigue? [ Yes ] Skin irritation? [ No ] Using lotions? [ No ] Lymphedema? [ No ] Issues with ROM? [ No ] Need refills: [ No ] Additional  Weekly Progress Notes [  ]    PatEd 2022-09-06 Ongoing education performed.   ImpPlan 2022-09-06 The patient is tolerating radiation. Continue treatment as planned.   PhysExam 2022-09-06 Alert, no acute distress.   PatEd 2022-09-10  Ongoing education performed.   ImpPlan 2022-09-10 The patient is tolerating radiation. Continue treatment as planned.   PhysExam 2022-09-10 Alert, no acute distress.   ProgNote 2022-09-10 Changes from last  week/visit? [ No ] Pain? [ No ] Fatigue? [ Yes ] Skin irritation? [ hyperpigmented, and itching.  ] Using lotions? [ Yes ] Lymphedema? [ No ] Issues with ROM? [ No ] Need refills: [  ] Additional  Weekly Progress Notes [ Patient reports tenderness to left breast. ]    PatEd 2022-09-11 Ongoing education performed.   ImpPlan 2022-09-11 The patient is tolerating radiation. Continue treatment as planned.   PhysExam 2022-09-11 Alert, no acute distress.   PatEd 2022-09-18 Ongoing education performed.   ImpPlan 2022-09-18 The patient is tolerating radiation. Continue treatment as planned.   PhysExam 2022-09-18 Alert, no acute distress.   ProgNote 2022-09-18 Changes from last week/visit? [ No ] Pain? [ No ] Fatigue? [ Yes ] Skin irritation? [ Yes - hyperpigmentation ] Using lotions? [ Yes - radiaplex ] Lymphedema? [ No ] Issues with ROM? [ No ] Need refills: [ No ] Additional  Weekly Progress Notes [  ]    PatEd 2022-09-25 Ongoing education performed.   ImpPlan 2022-09-25 The patient is tolerating radiation. Continue treatment as planned.   PhysExam 2022-09-25 Alert, no acute distress.   ProgNote 2022-09-25 Changes from last week/visit? [ Yes ] Pain? [ No ] Fatigue? [ Yes ] Skin irritation? [ No ] Using lotions? [ Yes ] Lymphedema? [ No ] Issues with ROM? [ No ] Need refills: [  ] Additional  Weekly Progress Notes [ Patient states last night she felt like she was going to pass out, then suddenly became nauseated. Patient called EMS. Once EMS arrived vitals were taken 142/80, 96, CBG 160 Temp 97.4 and 12 Lead EKG was performed. Patient states she felt better once she vomited. She did not go to ED for evaluation.  ]    PatEd 2022-10-02 Ongoing education performed.   ImpPlan 2022-10-02 The patient is tolerating radiation. Continue treatment as planned.   PhysExam 2022-10-02 Alert, no acute distress.   ProgNote 2022-10-02 Changes from last week/visit? [ No ] Pain? [ No  ] Fatigue? [ Yes ] Skin irritation? [ small open area under left breast, encouraged patient to apply neosporin.  ] Using lotions? [ Yes ] Lymphedema? [ No ] Issues with ROM? [ No ] Need refills: [ Yes- radiaplex ] Additional  Weekly Progress Notes [  ]

## 2022-10-30 NOTE — Telephone Encounter (Signed)
[place] patient a vm regarding upcoming appointments

## 2022-10-31 ENCOUNTER — Other Ambulatory Visit (HOSPITAL_COMMUNITY): Payer: Self-pay

## 2022-11-01 ENCOUNTER — Encounter: Payer: Self-pay | Admitting: Radiation Oncology

## 2022-11-04 NOTE — Progress Notes (Incomplete)
Radiation Oncology         [phone] ________________________________  Name: Cassandra Brennan MRN: [id number]  Date: 11/05/2022  DOB: [birth year]  Follow-Up Visit Note  CC: Everardo Beals, NP  Everardo Beals, NP    ICD-10-CM   1. Malignant neoplasm of upper-inner quadrant of left breast in female, estrogen receptor positive ([place])  C50.212    Z17.0       Diagnosis:  S/p neoadjuvant chemotherapy, anastrozole, and left lumpectomy: Left Breast UIQ, DCIS and 1 positive axillary SLN with no residual invasive disease in the breast. Prognostic panel for positive node: ER+ / PR+ / Her2-   Pre-treatment: Stage IIB (cT1c, cN1, cM0) Left Breast UIQ, Invasive and in-situ ductal carcinoma metastatic to one lymph node, ER+ / PR- / Her2-, Grade 3 (functionally triple negative)  Interval Since Last Radiation: 1 month and 3 days   Intent: Curative  Radiation Treatment Dates: 08/15/2022 through 10/02/2022 Site Technique Total Dose (Gy) Dose per Fx (Gy) Completed Fx Beam Energies  Breast, Left: Breast_L 3D 50.4/50.4 1.8 28/28 10X  Breast, Left: Breast_L_SCV_PAB 3D 50.4/50.4 1.8 28/28 6X, 15X  Breast, Left: Breast_L_Bst 3D 10/10 2 5/5 6X, 10X   Narrative:  The patient returns today for routine follow-up. The patient tolerated radiation therapy relatively well. On the date of her final treatment, the patient reported fatigue, and having a small open area under the left breast. Physical exam performed on that same date showed hyperpigmentation changes to the left breast area and a small area of skin breakdown in the inframammary fold for which the patient was given Neosporin.    In the interval since her initial consultation date, the patient has continued on adjuvant xeloda under the care of Dr. Burr Medico. She continues to tolerate xeloda well without any side effects.   She is scheduled to receive her next cycle of xeloda on 11/08/21 after she recovers from her eye surgery (performed on 10/23/22  for cataracts).  ***                              Allergies:  is allergic to prednisone, rosuvastatin, other, robitussin (alcohol free) [guaifenesin], and sulfa antibiotics.  Meds: Current Outpatient Medications  Medication Sig Dispense Refill   acetaminophen (TYLENOL) 500 MG tablet Take 500 mg by mouth every 8 (eight) hours as needed for moderate pain.     ALPRAZolam (XANAX) 0.25 MG tablet Take 1 tablet (0.25 mg total) by mouth daily as needed for anxiety. 30 tablet 0   apixaban (ELIQUIS) 5 MG TABS tablet TAKE 1 TABLET(5 MG) BY MOUTH TWICE DAILY 60 tablet 3   capecitabine (XELODA) 500 MG tablet Take 3 tablets by mouth in morning and 4 tablets in evening, every 10-12 hours, for 14 days then off for 7 days. Take after meals. 98 tablet 1   lidocaine-prilocaine (EMLA) cream Apply to affected area once 30 g 3   LISINOPRIL PO Take 1 tablet by mouth daily.     metoprolol succinate (TOPROL-XL) 100 MG 24 hr tablet Take 100 mg by mouth daily. Take with or immediately following a meal.     Multiple Vitamins-Minerals (MULTIVITAMIN ADULTS) TABS Take 1 tablet by mouth daily.     ondansetron (ZOFRAN) 8 MG tablet Take 1 tablet (8 mg total) by mouth 2 (two) times daily as needed. Start on the third day after chemotherapy. (Patient taking differently: Take 8 mg by mouth 2 (two) times daily as needed for  nausea or vomiting. Start on the third day after chemotherapy.) 30 tablet 1   potassium chloride (KLOR-CON) 10 MEQ tablet Take 10 mEq by mouth daily.     pravastatin (PRAVACHOL) 20 MG tablet Take 1 tablet (20 mg total) by mouth daily. 30 tablet 0   prochlorperazine (COMPAZINE) 10 MG tablet Take 1 tablet (10 mg total) by mouth every 6 (six) hours as needed (Nausea or vomiting). 30 tablet 1   No current facility-administered medications for this encounter.    Physical Findings: The patient is in no acute distress. Patient is alert and oriented.  vitals were not taken for this visit. .  No significant changes.  Lungs are clear to auscultation bilaterally. Heart has regular rate and rhythm. No palpable cervical, supraclavicular, or axillary adenopathy. Abdomen soft, non-tender, normal bowel sounds.  Right Breast: no palpable mass, nipple discharge or bleeding. Left Breast: ***  Lab Findings: Lab Results  Component Value Date   WBC 5.5 10/29/2022   HGB 9.0 (L) 10/29/2022   HCT 25.1 (L) 10/29/2022   MCV 92.3 10/29/2022   PLT 259 10/29/2022    Radiographic Findings: No results found.  Impression: S/p neoadjuvant chemotherapy, anastrozole, and left lumpectomy: Left Breast UIQ, DCIS and 1 positive axillary SLN with no residual invasive disease in the breast. Prognostic panel for positive node: ER+ / PR+ / Her2-   Pre-treatment: Stage IIB (cT1c, cN1, cM0) Left Breast UIQ, Invasive and in-situ ductal carcinoma metastatic to one lymph node, ER+ / PR- / Her2-, Grade 3 (functionally triple negative)  The patient is recovering from the effects of radiation.  ***  Plan:  ***   *** minutes of total time was spent for this patient encounter, including preparation, face-to-face counseling with the patient and coordination of care, physical exam, and documentation of the encounter. ____________________________________  Blair Promise, PhD, MD  This document serves as a record of services personally performed by Gery Pray, MD. It was created on his behalf by Roney Mans, a trained medical scribe. The creation of this record is based on the scribe's personal observations and the provider's statements to them. This document has been checked and approved by the attending provider.

## 2022-11-04 NOTE — Progress Notes (Incomplete)
  Radiation Oncology         [phone] ________________________________  Patient Name: Cassandra Brennan MRN: [id number] DOB: 11/22/1955 Referring Physician: Everardo Beals (Profile Not Attached) Date of Service: 10/02/2022  [facility] [place]                                                        End Of Treatment Note  Diagnoses: C50.212-Malignant neoplasm of upper-inner quadrant of left female breast  Cancer Staging: S/p neoadjuvant chemotherapy, anastrozole, and left lumpectomy: Left Breast UIQ, DCIS and 1 positive axillary SLN with no residual invasive disease in the breast. 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 On the date of her final treatment, the patient reported  fatigue, and having a small open area under the left breast. Physical exam performed on that same date showed hyperpigmentation changes to the left breast area and a small area of skin breakdown in the inframammary fold for which the patient was given Neosporin.  Plan: The patient will follow-up with radiation oncology in one month or sooner if her skin issues become more significant. ________________________________________________ -----------------------------------  Blair Promise, PhD, MD  This document serves as a record of services personally performed by Gery Pray, MD. It was created on his behalf by  Roney Mans, a trained medical scribe. The creation of this record is based on the scribe's personal observations and the provider's statements to them. This document has been checked and approved by the attending provider.

## 2022-11-05 ENCOUNTER — Ambulatory Visit
Admission: RE | Admit: 2022-11-05 | Discharge: 2022-11-05 | Disposition: A | Discharge status: Home / Self Care | Payer: Medicare HMO | Source: Ambulatory Visit | Attending: Radiation Oncology | Admitting: Radiation Oncology

## 2022-11-05 ENCOUNTER — Telehealth: Payer: Self-pay | Admitting: *Deleted

## 2022-11-05 DIAGNOSIS — C50212 Malignant neoplasm of upper-inner quadrant of left female breast: Secondary | ICD-10-CM

## 2022-11-05 DIAGNOSIS — Z170 Estrogen receptor positive status [ER+]: Secondary | ICD-10-CM

## 2022-11-05 HISTORY — DX: Personal history of irradiation: Z92.3

## 2022-11-05 NOTE — Telephone Encounter (Signed)
RETURNED PATIENT'S PHONE CALL, SPOKE WITH PATIENT. ?

## 2022-11-06 ENCOUNTER — Encounter: Payer: Self-pay | Admitting: Radiation Oncology

## 2022-11-07 NOTE — Progress Notes (Signed)
Radiation Oncology         [phone]) [phone] ________________________________  Name: Cassandra Brennan MRN: [id number]  Date: 11/08/2022  DOB: 1956-08-13  Follow-Up Visit Note  CC: Everardo Beals, NP  Everardo Beals, NP    ICD-10-CM   1. Malignant neoplasm of upper-inner quadrant of left breast in female, estrogen receptor positive ([place])  C50.212    Z17.0      Diagnosis:  S/p neoadjuvant chemotherapy, anastrozole, and left lumpectomy: Left Breast UIQ, DCIS and 1 positive axillary SLN with no residual invasive disease in the breast. 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 The patient tolerated radiation therapy relatively well. On the date of her final treatment, the patient reported fatigue, and having a small open area under the left breast. Physical exam performed on that same date showed hyperpigmentation changes to the left breast area and a small area of skin breakdown in the inframammary fold for which the patient was given Neosporin.    In the interval since her initial consultation date, the patient has continued on adjuvant xeloda under the care of Dr. Burr Medico. She continues to tolerate xeloda well without any side effects.   She is scheduled to receive her next cycle of xeloda on 11/08/21 after she recovers from her eye surgery (performed on 10/23/22 for  cataracts).  The patient reports to be doing well overall today. Her energy levels are returning back to normal. She notes that her skin is still hyperpigmented. She denies any breast pain, peeling, arm swelling, or nipple discharge.                               Allergies:  is allergic to prednisone, rosuvastatin, guaifenesin, sulfa antibiotics, and other.  Meds: Current Outpatient Medications  Medication Sig Dispense Refill   apixaban (ELIQUIS) 5 MG TABS tablet TAKE 1 TABLET(5 MG) BY MOUTH TWICE DAILY 60 tablet 3   capecitabine (XELODA) 500 MG tablet Take 3 tablets by mouth in morning and 4 tablets in evening, every 10-12 hours, for 14 days then off for 7 days. Take after meals. 98 tablet 1   lidocaine-prilocaine (EMLA) cream Apply to affected area once 30 g 3   LISINOPRIL PO Take 1 tablet by mouth daily.     metoprolol succinate (TOPROL-XL) 100 MG 24 hr tablet Take 100 mg by mouth daily. Take with or immediately following a meal.     moxifloxacin (VIGAMOX) 0.5 % ophthalmic solution Place 1 drop into the right eye 4 (four) times daily.     Multiple Vitamins-Minerals (MULTIVITAMIN ADULTS) TABS Take 1 tablet by mouth daily.     pravastatin (PRAVACHOL) 20 MG tablet Take 1 tablet (20 mg total) by mouth daily. 30 tablet 0   prednisoLONE acetate (PRED FORTE) 1 % ophthalmic suspension Place 1 drop into the right  eye 4 (four) times daily.     prochlorperazine (COMPAZINE) 10 MG tablet Take 1 tablet (10 mg total) by mouth every 6 (six) hours as needed (Nausea or vomiting). 30 tablet 1   acetaminophen (TYLENOL) 500 MG tablet Take 500 mg by mouth every 8 (eight) hours as needed for moderate pain. (Patient not taking: Reported on 11/08/2022)     ALPRAZolam (XANAX) 0.25 MG tablet Take 1 tablet (0.25 mg total) by mouth daily as needed for anxiety. (Patient not taking: Reported on 11/08/2022) 30 tablet 0   ondansetron (ZOFRAN) 8 MG tablet Take 1 tablet (8 mg total) by mouth 2 (two) times daily as needed. Start  on the third day after chemotherapy. (Patient not taking: Reported on 11/08/2022) 30 tablet 1   potassium chloride (KLOR-CON) 10 MEQ tablet Take 10 mEq by mouth daily. (Patient not taking: Reported on 11/08/2022)     No current facility-administered medications for this encounter.    Physical Findings: The patient is in no acute distress. Patient is alert and oriented.  height is '5\' 5"'$  (1.651 m) and weight is 174 lb 3.2 oz (79 kg). Her temperature is 97.9 F (36.6 C). Her blood pressure is 121/66 and her pulse is 75. Her respiration is 20 and oxygen saturation is 100%. .   Lungs are clear to auscultation bilaterally. Heart has regular rate and rhythm. No palpable cervical, supraclavicular, or axillary adenopathy. Abdomen soft, non-tender, normal bowel sounds.  Right Breast: no palpable mass, nipple discharge or bleeding. Left Breast: Hyperpigmentation surrounding the surgical incision sites, typical with radiation changes.   Lab Findings: Lab Results  Component Value Date   WBC 5.5 10/29/2022   HGB 9.0 (L) 10/29/2022   HCT 25.1 (L) 10/29/2022   MCV 92.3 10/29/2022   PLT 259 10/29/2022    Radiographic Findings: No results found.  Impression: S/p neoadjuvant chemotherapy, anastrozole, and left lumpectomy: Left Breast UIQ, DCIS and 1 positive axillary SLN with no residual invasive disease in the breast. 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  No evidence of disease recurrence on clinical exam today.   Plan: As needed follow-up.  She will continue Xeloda therapy under the care of Dr. Burr Medico. It was a pleasure taking part in this patient's care. She knows to call back with any questions or concerns.     ____________________________________   Leona Singleton, PA   Blair Promise, PhD, MD  This document serves as a record of services personally performed by Gery Pray, MD. It was created on his behalf by Roney Mans, a trained medical scribe. The creation of this record is based on the scribe's personal observations and the provider's statements to them. This document has been checked and approved by the attending provider.

## 2022-11-08 ENCOUNTER — Encounter: Payer: Self-pay | Admitting: Radiation Oncology

## 2022-11-08 ENCOUNTER — Ambulatory Visit
Admission: RE | Admit: 2022-11-08 | Discharge: 2022-11-08 | Disposition: A | Discharge status: Home / Self Care | Payer: Medicare HMO | Source: Ambulatory Visit | Attending: Radiation Oncology | Admitting: Radiation Oncology

## 2022-11-08 VITALS — BP 121/66 | HR 75 | Temp 97.9°F | Resp 20 | Ht 65.0 in | Wt 174.2 lb

## 2022-11-08 DIAGNOSIS — C50212 Malignant neoplasm of upper-inner quadrant of left female breast: Secondary | ICD-10-CM | POA: Diagnosis present

## 2022-11-08 DIAGNOSIS — Z170 Estrogen receptor positive status [ER+]: Secondary | ICD-10-CM | POA: Diagnosis not present

## 2022-11-08 DIAGNOSIS — Z79899 Other long term (current) drug therapy: Secondary | ICD-10-CM | POA: Insufficient documentation

## 2022-11-08 DIAGNOSIS — Z7901 Long term (current) use of anticoagulants: Secondary | ICD-10-CM | POA: Diagnosis not present

## 2022-11-08 DIAGNOSIS — Z923 Personal history of irradiation: Secondary | ICD-10-CM | POA: Insufficient documentation

## 2022-11-08 HISTORY — DX: Unspecified cataract: H26.9

## 2022-11-08 NOTE — Progress Notes (Signed)
Cassandra Brennan is here today for follow up post radiation to the breast.   Breast Side:left breast    They completed their radiation on:  10/02/22  Does the patient complain of any of the following: Post radiation skin issues:  None Breast Tenderness: none Breast Swelling: none Lymphadema: none Range of Motion limitations: no issues Fatigue post radiation: mild fatigue Appetite good/fair/poor: good  Additional comments if applicable: Vitals:   27/25/36 1126  BP: 121/66  Pulse: 75  Resp: 20  Temp: 97.9 F (36.6 C)  SpO2: 100%  Weight: 79 kg  Height: '5\' 5"'$  (1.651 m)

## 2022-11-15 ENCOUNTER — Other Ambulatory Visit (HOSPITAL_COMMUNITY): Payer: Self-pay

## 2022-11-21 ENCOUNTER — Other Ambulatory Visit (HOSPITAL_COMMUNITY): Payer: Self-pay

## 2022-11-22 ENCOUNTER — Other Ambulatory Visit (HOSPITAL_COMMUNITY): Payer: Self-pay

## 2022-11-26 ENCOUNTER — Other Ambulatory Visit (HOSPITAL_COMMUNITY): Payer: Self-pay

## 2022-11-27 ENCOUNTER — Telehealth: Payer: Self-pay | Admitting: Hematology

## 2022-11-27 ENCOUNTER — Other Ambulatory Visit (HOSPITAL_COMMUNITY): Payer: Self-pay

## 2022-11-27 NOTE — Telephone Encounter (Signed)
Contacted patient to scheduled appointments. Patient is aware of appointments that are scheduled.   

## 2022-11-27 NOTE — Assessment & Plan Note (Signed)
IDC, Stage IIB, c(T1c, N1), yp(T0, N1) ER 30% weak+, PR-/HER2-, Grade 3  -diagnosed in 10/2021 -s/p neoadjuvant chemo ACx4, followed by weekly taxol 01/22/22 - 03/20/22, discontinued due to hospitalization for massive PE and cardiogenic shock.  -she took anastrozole 04/17/22 - 06/22/22 due to delay in surgery from hospitalization, discontinued due to arthralgias. -s/p left lumpectomy on 07/10/22 under Dr. Cornett, path showed complete pathologic response in breast but 1/4 positive lymph nodes. Repeat prognostic panel on the positive node showed ER 20% weak, PR 2% moderate, and Her2 negative. -she completed concurrent chemoRT with Xeloda on 10/02/2022 -continue adjuvant Xeloda for 6 months, she is overall tolerating well. 

## 2022-11-27 NOTE — Progress Notes (Unsigned)
[name]   Telephone:(336) [phone] Fax:(336) [phone]   Clinic Follow up Note   Patient Care Team: Everardo Beals, NP as PCP - General Erroll Luna, MD as Consulting Physician (General Surgery) Truitt Merle, MD as Consulting Physician (Hematology) Gery Pray, MD as Consulting Physician (Radiation Oncology) [name], RN as Oncology Nurse Navigator [name], RN as Oncology Nurse Navigator  Date of Service:  11/28/2022  CHIEF COMPLAINT: f/u of left breast cancer    CURRENT THERAPY:   Adjuvant Xeloda, started 08/15/22             -Xeloda dose: '1500mg'$  am and '2000mg'$  pm, day 1-14 every 21 days  ASSESSMENT:  Cassandra Brennan is a 67 y.o. 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  -she took anastrozole 04/17/22 - 06/22/22 due to delay in surgery from hospitalization, discontinued due to arthralgias. -s/p left lumpectomy on 07/10/22 under Dr. Brantley Stage, path showed complete pathologic response in breast but 1/4 positive lymph nodes. Repeat prognostic panel on the positive node showed ER 20% weak, PR 2% moderate, and Her2 negative. -she completed concurrent chemoRT with Xeloda on 10/02/2022 -continue adjuvant Xeloda for 6 months, she is overall tolerating well.  history of massive pulmonary embolism ([place]) diagnosed in 03/2022 when she was on neoadjuvant chemotherapy -Status post thrombolysis, on Eliquis, tolerating well. -willcontinue Eliquis until she completes chemo, or indefinitely     PLAN: - I prescribe Urea cream  - lab reviewed - recommend Prenatal vitamins OTC due to anemia. -Cont XELODA, she will start next cycle tomorrow  -labs,flush  and f/u in 3 weeks   SUMMARY OF ONCOLOGIC HISTORY: Oncology History Overview Note   Cancer Staging  Malignant neoplasm of upper-inner quadrant of left breast in female, estrogen receptor positive ([place]) Staging form: Breast, AJCC 8th Edition - Clinical stage from 10/27/2021: Stage IIB (cT1c, cN1, cM0, G3, ER+, PR-, HER2-) - Signed by [name], NP on 11/28/2021 - Pathologic stage from 07/10/2022: No Stage Recommended (ypT0, pN1, cM0, G3, ER+, PR-, HER2-) - Unsigned     Malignant neoplasm of upper-inner quadrant of left breast in female, estrogen receptor positive ([place])  10/19/2021 Mammogram   CLINICAL DATA:  Patient presents for palpable left axillary abnormality.   EXAM: DIGITAL DIAGNOSTIC BILATERAL MAMMOGRAM WITH TOMOSYNTHESIS AND CAD; ULTRASOUND LEFT BREAST LIMITED  IMPRESSION: Suspicious left breast mass 10 o'clock position.   Adjacent suspicious satellite nodule left breast 12 o'clock position.   Palpable mass left axilla corresponds with a large lymph node.   10/27/2021 Initial Biopsy   Diagnosis 1. Breast, left, needle core biopsy, 12 o'clock, ribbon - FIBROADENOMA - NO MALIGNANCY IDENTIFIED 2. Breast, left, needle core biopsy, 10 o'clock, coil - INVASIVE DUCTAL CARCINOMA WITH NECROSIS - DUCTAL CARCINOMA IN SITU - SEE COMMENT 3. Lymph node, needle/core biopsy, left axilla, tribell - INVASIVE DUCTAL CARCINOMA WITH NECROSIS - NO NODAL TISSUE IDENTIFIED - SEE COMMENT Microscopic Comment 2. and 2. Based on the biopsy, the carcinoma appears Nottingham grade 3 of 3 and measures 0.8 cm in greatest linear extent.  3. PROGNOSTIC INDICATORS Results: The tumor cells are NEGATIVE for Her2 (1+). Estrogen Receptor: 30%, POSITIVE, WEAK STAINING INTENSITY Progesterone Receptor: 0%, NEGATIVE Proliferation Marker Ki67:  60%   10/27/2021 Cancer Staging   Staging form: Breast, AJCC 8th Edition - Clinical stage from 10/27/2021: Stage IIB (cT1c, cN1, cM0, G3, ER+, PR-, HER2-) -  Signed by [name], NP on 11/28/2021 Stage prefix: Initial diagnosis Histologic grading system: 3 grade system   11/06/2021 Initial Diagnosis   Malignant neoplasm of upper-inner quadrant of left breast in female, estrogen receptor positive ([place])   11/15/2021 Breast MRI   IMPRESSION: 1.9 cm mass in the upper-inner quadrant of the left breast corresponding with the known invasive ductal carcinoma. Enlarged left axillary lymph node corresponding with known metastatic disease.   RECOMMENDATION: Treatment planning of the known left breast cancer and axillary metastasis is recommended.   Additional imaging evaluation of the mass in the liver is recommended with CT with liver mass protocol or MRI.   11/22/2021 Imaging   Bone scan IMPRESSION: No scintigraphic evidence of bony metastatic disease. Degenerative changes as above.   11/22/2021 Echocardiogram   Baseline echo LVEF 55-60%, normal GLS -21.7%   11/23/2021 Imaging   CT CAP IMPRESSION: 1. Bulky lymphadenopathy in the left axilla including a 5.0 x 2.8 cm lymph node. 2. 15 mm nodule identified in the upper inner quadrant of the left breast. 3. No suspicious pulmonary nodule or mass. No evidence for metastatic disease in the abdomen or pelvis. 4. 11 mm cyst in the dome of the left liver. Adjacent tiny hypoattenuating lesions in the right liver are too small to characterize, but likely benign. Attention on follow-up recommended. 5. Prominent stool volume raises the question of clinical constipation.   11/28/2021 - 03/20/2022 Chemotherapy   Patient is on Treatment Plan : BREAST ADJUVANT DOSE DENSE AC q14d / PACLitaxel q7d      Genetic Testing   [facility] CancerNext was Negative. Report date is 11/26/2021.   The CancerNext gene panel offered by [employer] includes sequencing, rearrangement analysis, and RNA analysis for the following 36 genes:   APC, ATM, AXIN2, BARD1, BMPR1A, BRCA1, BRCA2, BRIP1, CDH1, CDK4, CDKN2A, CHEK2, DICER1, HOXB13, EPCAM,  GREM1, MLH1, MSH2, MSH3, MSH6, MUTYH, NBN, NF1, NTHL1, PALB2, PMS2, POLD1, POLE, PTEN, RAD51C, RAD51D, RECQL, SMAD4, SMARCA4, STK11, and TP53.    07/10/2022 Definitive Surgery   FINAL MICROSCOPIC DIAGNOSIS:   A. BREAST, LEFT, LUMPECTOMY:  - No residual carcinoma identified  - Treatment effect present (see comment)  - Ductal carcinoma in situ present, ypTis  - Margins negative for ductal carcinoma in situ  - See oncology table   B. BREAST, LEFT ADDITIONAL MEDIAL MARGIN, EXCISION:  - Negative for carcinoma   C. BREAST, LEFT ADDITIONAL LATERAL MARGIN, EXCISION:  - Negative for carcinoma   D. LYMPH NODE, LEFT AXILLARY, SENTINEL, EXCISION:  - Positive for carcinoma (1/1)   E. LYMPH NODE, LEFT AXILLARY, SENTINEL, EXCISION:  - Negative for carcinoma (0/1)   F. LYMPH NODE, LEFT, AXILLARY CONTENTS:  - Two lymph nodes identified  - Negative for carcinoma (0/2)    ADDENDUM:  Part D: Left axillary sentinel lymph node,   PROGNOSTIC INDICATOR RESULTS:  The tumor cells are NEGATIVE for Her2 (0).  Estrogen Receptor:       POSITIVE, 20%, WEAK STAINING  Progesterone Receptor:   POSITIVE, 2%, MODERATE STAINING       INTERVAL HISTORY:  Cassandra Brennan is here for a follow up of  left breast cancer  She was last seen by me on 10/29/2022 She presents to the clinic alone. Pt feels like she has recovered well. She does not feel  fatigue. Pt states she has recently moved to [place]. Pt state she has some dryness and darkness from the chemo oral pill. Ppt states she had some nausea some days and the nausea medicine helps. Pt  notice that when she walks up the stairs she would get fatigue and some SOB.    All other systems were reviewed with the patient and are negative.  MEDICAL HISTORY:  Past Medical History:  Diagnosis Date   Acute dyspnea    Acute metabolic encephalopathy    Anxiety    Arthritis    right knee    Cataract    Elevated troponin    Femoral DVT (deep venous thrombosis)  ([facility])    GERD (gastroesophageal reflux disease)    History of radiation therapy    Left breast 08/15/22-10/02/22- Dr. Gery Pray   History of radiation therapy    Left breast- 08/15/22-10/02/22- Dr. Gery Pray   Hyperlipidemia    Hypertension    Lactic acidosis    Obstructive cardiovascular shock ([place]) 03/22/2022   Pneumonia    Positive colorectal cancer screening using Cologuard test    Pulmonary embolism ([place])    03/22/22    SURGICAL HISTORY: Past Surgical History:  Procedure Laterality Date   BREAST CYST EXCISION Right    pt stated in [place] Left 07/10/2022   Procedure: LEFT BREAST LUMPECTOMY WITH RADIOACTIVE SEED AND LEFT  SENTINEL LYMPH NODE MAPPING;  Surgeon: Erroll Luna, MD;  Location: [facility];  Service: General;  Laterality: Left;   BREAST SURGERY     other GI testing     unsure but had to drink a chalky drink    PORTACATH PLACEMENT N/A 11/27/2021   Procedure: INSERTION PORT-A-CATH;  Surgeon: Erroll Luna, MD;  Location: [facility];  Service: General;  Laterality: N/A;   RADIOACTIVE SEED GUIDED AXILLARY SENTINEL LYMPH NODE Left 07/10/2022   Procedure: LEFT AXILLARY SEED LYMPH NODE BIOPSY;  Surgeon: Erroll Luna, MD;  Location: [facility];  Service: General;  Laterality: Left;   TONSILLECTOMY     TUBAL LIGATION      I have reviewed the social history and family history with the patient and they are unchanged from previous note.  ALLERGIES:  is allergic to prednisone, rosuvastatin, guaifenesin, sulfa antibiotics, and other.  MEDICATIONS:  Current Outpatient Medications  Medication Sig Dispense Refill   urea (CARMOL) 10 % cream Apply topically as needed. 71 g 0   acetaminophen (TYLENOL) 500 MG tablet Take 500 mg by mouth every 8 (eight) hours as needed for moderate pain. (Patient not taking: Reported on 11/08/2022)     apixaban (ELIQUIS) 5 MG TABS tablet TAKE 1  TABLET(5 MG) BY MOUTH TWICE DAILY 60 tablet 3   capecitabine (XELODA) 500 MG tablet Take 3 tablets by mouth in morning and 4 tablets in evening, every 10-12 hours, for 14 days then off for 7 days. Take after meals. 98 tablet 1   lidocaine-prilocaine (EMLA) cream Apply to affected area once 30 g 3   LISINOPRIL PO Take 1 tablet by mouth daily.     metoprolol succinate (TOPROL-XL) 100 MG 24 hr tablet Take 100 mg by mouth daily. Take with or immediately following a meal.     moxifloxacin (VIGAMOX) 0.5 % ophthalmic solution Place 1 drop into the right eye 4 (four) times daily.     Multiple Vitamins-Minerals (MULTIVITAMIN ADULTS) TABS Take 1 tablet by mouth daily.  ondansetron (ZOFRAN) 8 MG tablet Take 1 tablet (8 mg total) by mouth 2 (two) times daily as needed. Start on the third day after chemotherapy. (Patient not taking: Reported on 11/08/2022) 30 tablet 1   pravastatin (PRAVACHOL) 20 MG tablet Take 1 tablet (20 mg total) by mouth daily. 30 tablet 0   prednisoLONE acetate (PRED FORTE) 1 % ophthalmic suspension Place 1 drop into the right eye 4 (four) times daily.     prochlorperazine (COMPAZINE) 10 MG tablet Take 1 tablet (10 mg total) by mouth every 6 (six) hours as needed (Nausea or vomiting). 30 tablet 1   No current facility-administered medications for this visit.    PHYSICAL EXAMINATION: ECOG PERFORMANCE STATUS: 1 - Symptomatic but completely ambulatory  Vitals:   11/28/22 0946  BP: 130/70  Pulse: 92  Resp: 18  Temp: 98.6 F (37 C)  SpO2: 100%   Wt Readings from Last 3 Encounters:  11/28/22 174 lb 3.2 oz (79 kg)  11/08/22 174 lb 3.2 oz (79 kg)  10/29/22 171 lb 6.4 oz (77.7 kg)     GENERAL:alert, no distress and comfortable SKIN: skin color normal, no rashes or significant lesions EYES: normal, Conjunctiva are pink and non-injected, sclera clear  NEURO: alert & oriented x 3 with fluent speech   LABORATORY DATA:  I have reviewed the data as listed    Latest Ref Rng &  Units 11/28/2022    9:16 AM 10/29/2022    1:32 PM 10/01/2022   12:24 PM  CBC  WBC 4.0 - 10.5 K/uL 4.4  5.5  6.8   Hemoglobin 12.0 - 15.0 g/dL 8.2  9.0  9.9   Hematocrit 36.0 - 46.0 % 23.8  25.1  28.3   Platelets 150 - 400 K/uL 222  259  216         Latest Ref Rng & Units 11/28/2022    9:16 AM 10/29/2022    1:32 PM 10/01/2022   12:24 PM  CMP  Glucose 70 - 99 mg/dL 100  93  100   BUN 8 - 23 mg/dL '17  18  16   '$ Creatinine 0.44 - 1.00 mg/dL 0.98  0.91  1.02   Sodium 135 - 145 mmol/L 142  140  140   Potassium 3.5 - 5.1 mmol/L 3.4  3.4  3.8   Chloride 98 - 111 mmol/L 107  104  106   CO2 22 - 32 mmol/L '30  28  28   '$ Calcium 8.9 - 10.3 mg/dL 9.2  9.5  9.7   Total Protein 6.5 - 8.1 g/dL 6.4  6.7  6.9   Total Bilirubin 0.3 - 1.2 mg/dL 0.9  0.9  0.7   Alkaline Phos 38 - 126 U/L 84  83  82   AST 15 - 41 U/L '13  17  13   '$ ALT 0 - 44 U/L '14  19  14       '$ RADIOGRAPHIC STUDIES: I have personally reviewed the radiological images as listed and agreed with the findings in the report. No results found.    Orders Placed This Encounter  Procedures   Ferritin    Standing Status:   Future    Standing Expiration Date:   11/29/2023   Vitamin B12    Standing Status:   Future    Standing Expiration Date:   11/28/2023   Folate, Serum    Standing Status:   Future    Standing Expiration Date:   11/28/2023   Retic Panel  Standing Status:   Future    Standing Expiration Date:   11/29/2023   All questions were answered. The patient knows to call the clinic with any problems, questions or concerns. No barriers to learning was detected. The total time spent in the appointment was 30 minutes.     Truitt Merle, MD 11/28/2022   Felicity Coyer, CMA, am acting as scribe for Truitt Merle, MD.   I have reviewed the above documentation for accuracy and completeness, and I agree with the above.

## 2022-11-27 NOTE — Assessment & Plan Note (Signed)
diagnosed in 03/2022 when she was on neoadjuvant chemotherapy -Status post thrombolysis, on Eliquis, tolerating well. -willcontinue Eliquis until she completes chemo, or indefinitely   

## 2022-11-28 ENCOUNTER — Inpatient Hospital Stay: Payer: Medicare HMO | Admitting: Hematology

## 2022-11-28 ENCOUNTER — Encounter: Payer: Self-pay | Admitting: Hematology

## 2022-11-28 ENCOUNTER — Other Ambulatory Visit (HOSPITAL_COMMUNITY): Payer: Self-pay

## 2022-11-28 ENCOUNTER — Inpatient Hospital Stay: Payer: Medicare HMO | Attending: Hematology

## 2022-11-28 ENCOUNTER — Other Ambulatory Visit: Payer: Self-pay

## 2022-11-28 VITALS — BP 130/70 | HR 92 | Temp 98.6°F | Resp 18 | Wt 174.2 lb

## 2022-11-28 DIAGNOSIS — Z170 Estrogen receptor positive status [ER+]: Secondary | ICD-10-CM

## 2022-11-28 DIAGNOSIS — C50212 Malignant neoplasm of upper-inner quadrant of left female breast: Secondary | ICD-10-CM | POA: Diagnosis not present

## 2022-11-28 DIAGNOSIS — Z882 Allergy status to sulfonamides status: Secondary | ICD-10-CM | POA: Insufficient documentation

## 2022-11-28 DIAGNOSIS — R5383 Other fatigue: Secondary | ICD-10-CM | POA: Diagnosis not present

## 2022-11-28 DIAGNOSIS — I10 Essential (primary) hypertension: Secondary | ICD-10-CM | POA: Diagnosis not present

## 2022-11-28 DIAGNOSIS — R110 Nausea: Secondary | ICD-10-CM | POA: Diagnosis not present

## 2022-11-28 DIAGNOSIS — D649 Anemia, unspecified: Secondary | ICD-10-CM

## 2022-11-28 DIAGNOSIS — Z86711 Personal history of pulmonary embolism: Secondary | ICD-10-CM | POA: Insufficient documentation

## 2022-11-28 DIAGNOSIS — C773 Secondary and unspecified malignant neoplasm of axilla and upper limb lymph nodes: Secondary | ICD-10-CM | POA: Diagnosis not present

## 2022-11-28 DIAGNOSIS — Z888 Allergy status to other drugs, medicaments and biological substances status: Secondary | ICD-10-CM | POA: Diagnosis not present

## 2022-11-28 DIAGNOSIS — Z9851 Tubal ligation status: Secondary | ICD-10-CM | POA: Insufficient documentation

## 2022-11-28 DIAGNOSIS — Z7901 Long term (current) use of anticoagulants: Secondary | ICD-10-CM | POA: Diagnosis not present

## 2022-11-28 DIAGNOSIS — Z86718 Personal history of other venous thrombosis and embolism: Secondary | ICD-10-CM | POA: Diagnosis not present

## 2022-11-28 DIAGNOSIS — Z79899 Other long term (current) drug therapy: Secondary | ICD-10-CM | POA: Insufficient documentation

## 2022-11-28 DIAGNOSIS — I2699 Other pulmonary embolism without acute cor pulmonale: Secondary | ICD-10-CM | POA: Diagnosis not present

## 2022-11-28 DIAGNOSIS — R0602 Shortness of breath: Secondary | ICD-10-CM | POA: Diagnosis not present

## 2022-11-28 DIAGNOSIS — Z923 Personal history of irradiation: Secondary | ICD-10-CM | POA: Insufficient documentation

## 2022-11-28 LAB — CBC WITH DIFFERENTIAL (CANCER CENTER ONLY)
Abs Immature Granulocytes: 0.02 10*3/uL (ref 0.00–0.07)
Basophils Absolute: 0 10*3/uL (ref 0.0–0.1)
Basophils Relative: 0 %
Eosinophils Absolute: 0.4 10*3/uL (ref 0.0–0.5)
Eosinophils Relative: 9 %
HCT: 23.8 % — ABNORMAL LOW (ref 36.0–46.0)
Hemoglobin: 8.2 g/dL — ABNORMAL LOW (ref 12.0–15.0)
Immature Granulocytes: 1 %
Lymphocytes Relative: 13 %
Lymphs Abs: 0.6 10*3/uL — ABNORMAL LOW (ref 0.7–4.0)
MCH: 35.8 pg — ABNORMAL HIGH (ref 26.0–34.0)
MCHC: 34.5 g/dL (ref 30.0–36.0)
MCV: 103.9 fL — ABNORMAL HIGH (ref 80.0–100.0)
Monocytes Absolute: 0.4 10*3/uL (ref 0.1–1.0)
Monocytes Relative: 9 %
Neutro Abs: 3 10*3/uL (ref 1.7–7.7)
Neutrophils Relative %: 68 %
Platelet Count: 222 10*3/uL (ref 150–400)
RBC: 2.29 MIL/uL — ABNORMAL LOW (ref 3.87–5.11)
RDW: 20.3 % — ABNORMAL HIGH (ref 11.5–15.5)
WBC Count: 4.4 10*3/uL (ref 4.0–10.5)
nRBC: 0 % (ref 0.0–0.2)

## 2022-11-28 LAB — CMP (CANCER CENTER ONLY)
ALT: 14 U/L (ref 0–44)
AST: 13 U/L — ABNORMAL LOW (ref 15–41)
Albumin: 4 g/dL (ref 3.5–5.0)
Alkaline Phosphatase: 84 U/L (ref 38–126)
Anion gap: 5 (ref 5–15)
BUN: 17 mg/dL (ref 8–23)
CO2: 30 mmol/L (ref 22–32)
Calcium: 9.2 mg/dL (ref 8.9–10.3)
Chloride: 107 mmol/L (ref 98–111)
Creatinine: 0.98 mg/dL (ref 0.44–1.00)
GFR, Estimated: >60 mL/min (ref >60)
Glucose, Bld: 100 mg/dL — ABNORMAL HIGH (ref 70–99)
Potassium: 3.4 mmol/L — ABNORMAL LOW (ref 3.5–5.1)
Sodium: 142 mmol/L (ref 135–145)
Total Bilirubin: 0.9 mg/dL (ref 0.3–1.2)
Total Protein: 6.4 g/dL — ABNORMAL LOW (ref 6.5–8.1)

## 2022-11-28 LAB — SAMPLE TO BLOOD BANK

## 2022-11-28 MED ORDER — CAPECITABINE 500 MG PO TABS
ORAL_TABLET | ORAL | 1 refills | Status: DC
  Filled 2022-11-28: qty 98, fill #0
  Filled 2022-12-11: qty 98, 21d supply, fill #0

## 2022-11-28 MED ORDER — UREA 10 % EX CREA: TOPICAL_CREAM | CUTANEOUS | 0 refills | Status: DC | PRN

## 2022-11-29 ENCOUNTER — Other Ambulatory Visit (HOSPITAL_COMMUNITY): Payer: Self-pay

## 2022-12-03 ENCOUNTER — Telehealth: Payer: Self-pay

## 2022-12-03 NOTE — Telephone Encounter (Signed)
Patient called this morning stating she is having a scratchy throat, nausea feeling tired and bad cough. She tested yesterday for Covid and she tested positive. She called the nurse line last night and they stated she should hold her chemo pill Capecitabine 523m till she talks to the office this morning. I spoke with STammi SouRN and she stated it was ok to not take the meds for a few days til her symptoms subsided but as soon as they do to start taking it again. Patient voiced understanding.

## 2022-12-11 ENCOUNTER — Other Ambulatory Visit: Payer: Self-pay

## 2022-12-11 ENCOUNTER — Other Ambulatory Visit (HOSPITAL_COMMUNITY): Payer: Self-pay

## 2022-12-14 ENCOUNTER — Other Ambulatory Visit (HOSPITAL_COMMUNITY): Payer: Self-pay

## 2022-12-14 NOTE — Progress Notes (Unsigned)
[facility]   Telephone:([phone]) [phone] [phone]) [phone]   Clinic Follow up Note   Patient Care Team: Everardo Beals, NP as PCP - General Erroll Luna, MD as Consulting Physician (General Surgery) Truitt Merle, MD as Consulting Physician (Hematology) Gery Pray, MD as Consulting Physician (Radiation Oncology) [name], RN as Oncology Nurse Navigator [name], RN as Oncology Nurse Navigator  Date of Service:  12/17/2022  CHIEF COMPLAINT: f/u of left breast cancer     CURRENT THERAPY:    Adjuvant Xeloda, started 08/15/22             -Xeloda dose: '1500mg'$  am and '2000mg'$  pm, day 1-14 every 21 days  ASSESSMENT:  Cassandra Brennan is a 67 y.o. 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  -she took anastrozole 04/17/22 - 06/22/22 due to delay in surgery from hospitalization, discontinued due to arthralgias. -s/p left lumpectomy on 07/10/22 under Dr. Brantley Stage, path showed complete pathologic response in breast but 1/4 positive lymph nodes. Repeat prognostic panel on the positive node showed ER 20% weak, PR 2% moderate, and Her2 negative. -she completed concurrent chemoRT with Xeloda on 10/02/2022 -continue adjuvant Xeloda for 6 months, she is overall tolerating well.  history of massive pulmonary embolism ([place]) diagnosed in 03/2022 when she was on neoadjuvant chemotherapy -Status post thrombolysis, on Eliquis, tolerating well. -willcontinue Eliquis until she completes chemo, or indefinitely    Anemia -Secondary to secondary to chemotherapy, chemo globin 7.9 today, will arrange 1 unit blood transfusion in next few days -Will check her iron level to  see if she needs IV iron.  She is on prenatal vitamins   PLAN: -lab reviewed hg 7.9, will arrange 1 unit of blood transfusion in next few days -continue XELODA 3 in the morning and 3 in the evening, day 1-14 every 21 days  For 14d and 1 week off. -Iron- pending - I refill Eliquis -f/u in 3 weeks before last cycle Xeloda    SUMMARY OF ONCOLOGIC HISTORY: Oncology History Overview Note   Cancer Staging  Malignant neoplasm of upper-inner quadrant of left breast in female, estrogen receptor positive ([place]) Staging form: Breast, AJCC 8th Edition - Clinical stage from 10/27/2021: Stage IIB (cT1c, cN1, cM0, G3, ER+, PR-, HER2-) - Signed by [name], NP on 11/28/2021 - Pathologic stage from 07/10/2022: No Stage Recommended (ypT0, pN1, cM0, G3, ER+, PR-, HER2-) - Unsigned     Malignant neoplasm of upper-inner quadrant of left breast in female, estrogen receptor positive ([place])  10/19/2021 Mammogram   CLINICAL DATA:  Patient presents for palpable left axillary abnormality.   EXAM: DIGITAL DIAGNOSTIC BILATERAL MAMMOGRAM WITH TOMOSYNTHESIS AND CAD; ULTRASOUND LEFT BREAST LIMITED  IMPRESSION: Suspicious left breast mass 10 o'clock position.   Adjacent suspicious satellite nodule left breast 12 o'clock position.   Palpable mass left axilla corresponds with a large lymph node.   10/27/2021 Initial Biopsy   Diagnosis 1. Breast, left, needle core biopsy, 12 o'clock, ribbon - FIBROADENOMA - NO MALIGNANCY IDENTIFIED 2. Breast, left, needle core biopsy, 10 o'clock, coil - INVASIVE DUCTAL CARCINOMA WITH NECROSIS - DUCTAL CARCINOMA IN SITU - SEE COMMENT 3. Lymph node, needle/core biopsy, left axilla, tribell - INVASIVE DUCTAL CARCINOMA  WITH NECROSIS - NO NODAL TISSUE IDENTIFIED - SEE COMMENT Microscopic Comment 2. and 2. Based on the biopsy, the carcinoma appears Nottingham grade 3 of 3 and measures 0.8 cm in greatest linear extent.  3. PROGNOSTIC INDICATORS Results: The tumor cells  are NEGATIVE for Her2 (1+). Estrogen Receptor: 30%, POSITIVE, WEAK STAINING INTENSITY Progesterone Receptor: 0%, NEGATIVE Proliferation Marker Ki67: 60%   10/27/2021 Cancer Staging   Staging form: Breast, AJCC 8th Edition - Clinical stage from 10/27/2021: Stage IIB (cT1c, cN1, cM0, G3, ER+, PR-, HER2-) - Signed by [name], NP on 11/28/2021 Stage prefix: Initial diagnosis Histologic grading system: 3 grade system   11/06/2021 Initial Diagnosis   Malignant neoplasm of upper-inner quadrant of left breast in female, estrogen receptor positive ([place])   11/15/2021 Breast MRI   IMPRESSION: 1.9 cm mass in the upper-inner quadrant of the left breast corresponding with the known invasive ductal carcinoma. Enlarged left axillary lymph node corresponding with known metastatic disease.   RECOMMENDATION: Treatment planning of the known left breast cancer and axillary metastasis is recommended.   Additional imaging evaluation of the mass in the liver is recommended with CT with liver mass protocol or MRI.   11/22/2021 Imaging   Bone scan IMPRESSION: No scintigraphic evidence of bony metastatic disease. Degenerative changes as above.   11/22/2021 Echocardiogram   Baseline echo LVEF 55-60%, normal GLS -21.7%   11/23/2021 Imaging   CT CAP IMPRESSION: 1. Bulky lymphadenopathy in the left axilla including a 5.0 x 2.8 cm lymph node. 2. 15 mm nodule identified in the upper inner quadrant of the left breast. 3. No suspicious pulmonary nodule or mass. No evidence for metastatic disease in the abdomen or pelvis. 4. 11 mm cyst in the dome of the left liver. Adjacent tiny hypoattenuating lesions in the right liver are too small to characterize, but likely benign. Attention on follow-up recommended. 5. Prominent stool volume raises the question of clinical constipation.   11/28/2021 - 03/20/2022 Chemotherapy   Patient is on Treatment Plan : BREAST ADJUVANT DOSE DENSE AC q14d / PACLitaxel q7d      Genetic Testing    [facility] CancerNext was Negative. Report date is 11/26/2021.   The CancerNext gene panel offered by [employer] includes sequencing, rearrangement analysis, and RNA analysis for the following 36 genes:   APC, ATM, AXIN2, BARD1, BMPR1A, BRCA1, BRCA2, BRIP1, CDH1, CDK4, CDKN2A, CHEK2, DICER1, HOXB13, EPCAM, GREM1, MLH1, MSH2, MSH3, MSH6, MUTYH, NBN, NF1, NTHL1, PALB2, PMS2, POLD1, POLE, PTEN, RAD51C, RAD51D, RECQL, SMAD4, SMARCA4, STK11, and TP53.    07/10/2022 Definitive Surgery   FINAL MICROSCOPIC DIAGNOSIS:   A. BREAST, LEFT, LUMPECTOMY:  - No residual carcinoma identified  - Treatment effect present (see comment)  - Ductal carcinoma in situ present, ypTis  - Margins negative for ductal carcinoma in situ  - See oncology table   B. BREAST, LEFT ADDITIONAL MEDIAL MARGIN, EXCISION:  - Negative for carcinoma   C. BREAST, LEFT ADDITIONAL LATERAL MARGIN, EXCISION:  - Negative for carcinoma   D. LYMPH NODE, LEFT AXILLARY, SENTINEL, EXCISION:  - Positive for carcinoma (1/1)   E. LYMPH NODE, LEFT AXILLARY, SENTINEL, EXCISION:  - Negative for carcinoma (0/1)   F. LYMPH NODE, LEFT, AXILLARY CONTENTS:  - Two lymph nodes identified  - Negative for carcinoma (0/2)    ADDENDUM:  Part D: Left axillary sentinel lymph node,   PROGNOSTIC INDICATOR RESULTS:  The tumor cells are NEGATIVE for Her2 (0).  Estrogen Receptor:  POSITIVE, 20%, WEAK STAINING  Progesterone Receptor:   POSITIVE, 2%, MODERATE STAINING       INTERVAL HISTORY:   Cassandra Brennan is here for a follow up of  left breast cancer   She was last seen by ME on 11/28/2022. She presents to the clinic alone. Pt state she had some peeling and dark in color on her hand and feet. Pt start back Xeloda on sun day. Pt denied having black stool.      All other systems were reviewed with the patient and are negative.  MEDICAL HISTORY:  Past Medical History:  Diagnosis Date   Acute dyspnea    Acute metabolic encephalopathy     Anxiety    Arthritis    right knee    Cataract    Elevated troponin    Femoral DVT (deep venous thrombosis) ([facility])    GERD (gastroesophageal reflux disease)    History of radiation therapy    Left breast 08/15/22-10/02/22- Dr. Gery Pray   History of radiation therapy    Left breast- 08/15/22-10/02/22- Dr. Gery Pray   Hyperlipidemia    Hypertension    Lactic acidosis    Obstructive cardiovascular shock ([place]) 03/22/2022   Pneumonia    Positive colorectal cancer screening using Cologuard test    Pulmonary embolism ([place])    03/22/22    SURGICAL HISTORY: Past Surgical History:  Procedure Laterality Date   BREAST CYST EXCISION Right    pt stated in [place] Left 07/10/2022   Procedure: LEFT BREAST LUMPECTOMY WITH RADIOACTIVE SEED AND LEFT  SENTINEL LYMPH NODE MAPPING;  Surgeon: Erroll Luna, MD;  Location: [facility];  Service: General;  Laterality: Left;   BREAST SURGERY     other GI testing     unsure but had to drink a chalky drink    PORTACATH PLACEMENT N/A 11/27/2021   Procedure: INSERTION PORT-A-CATH;  Surgeon: Erroll Luna, MD;  Location: [facility];  Service: General;  Laterality: N/A;   RADIOACTIVE SEED GUIDED AXILLARY SENTINEL LYMPH NODE Left 07/10/2022   Procedure: LEFT AXILLARY SEED LYMPH NODE BIOPSY;  Surgeon: Erroll Luna, MD;  Location: [facility];  Service: General;  Laterality: Left;   TONSILLECTOMY     TUBAL LIGATION      I have reviewed the social history and family history with the patient and they are unchanged from previous note.  ALLERGIES:  is allergic to prednisone, rosuvastatin, guaifenesin, sulfa antibiotics, and other.  MEDICATIONS:  Current Outpatient Medications  Medication Sig Dispense Refill   acetaminophen (TYLENOL) 500 MG tablet Take 500 mg by mouth every 8 (eight) hours as needed for moderate pain. (Patient not taking: Reported on  11/08/2022)     apixaban (ELIQUIS) 5 MG TABS tablet TAKE 1 TABLET(5 MG) BY MOUTH TWICE DAILY 60 tablet 3   capecitabine (XELODA) 500 MG tablet Take 3 tablets by mouth in morning and 3 tablets in evening, every 10-12 hours, for 14 days then off for 7 days. Take after meals. 84 tablet 1   lidocaine-prilocaine (EMLA) cream Apply to affected area once 30 g 3   LISINOPRIL PO Take 1 tablet by mouth daily.     metoprolol succinate (TOPROL-XL) 100 MG 24 hr tablet Take 100 mg by mouth daily. Take with or immediately following a meal.     moxifloxacin (VIGAMOX) 0.5 % ophthalmic solution Place 1 drop into the right eye 4 (four) times daily.  Multiple Vitamins-Minerals (MULTIVITAMIN ADULTS) TABS Take 1 tablet by mouth daily.     ondansetron (ZOFRAN) 8 MG tablet Take 1 tablet (8 mg total) by mouth 2 (two) times daily as needed. Start on the third day after chemotherapy. (Patient not taking: Reported on 11/08/2022) 30 tablet 1   pravastatin (PRAVACHOL) 20 MG tablet Take 1 tablet (20 mg total) by mouth daily. 30 tablet 0   prednisoLONE acetate (PRED FORTE) 1 % ophthalmic suspension Place 1 drop into the right eye 4 (four) times daily.     prochlorperazine (COMPAZINE) 10 MG tablet Take 1 tablet (10 mg total) by mouth every 6 (six) hours as needed (Nausea or vomiting). 30 tablet 1   urea (CARMOL) 10 % cream Apply topically as needed. 71 g 0   No current facility-administered medications for this visit.    PHYSICAL EXAMINATION: ECOG PERFORMANCE STATUS: 1 - Symptomatic but completely ambulatory  Vitals:   12/17/22 1331  BP: 124/64  Pulse: 83  Resp: 18  Temp: 98.3 F (36.8 C)  SpO2: 100%   Wt Readings from Last 3 Encounters:  12/17/22 175 lb 12.8 oz (79.7 kg)  11/28/22 174 lb 3.2 oz (79 kg)  11/08/22 174 lb 3.2 oz (79 kg)     GENERAL:alert, no distress and comfortable SKIN: skin color normal, no rashes or significant lesions EYES: normal, Conjunctiva are pink and non-injected, sclera clear  NEURO:  alert & oriented x 3 with fluent speech   LABORATORY DATA:  I have reviewed the data as listed    Latest Ref Rng & Units 12/17/2022    1:09 PM 11/28/2022    9:16 AM 10/29/2022    1:32 PM  CBC  WBC 4.0 - 10.5 K/uL 5.3  4.4  5.5   Hemoglobin 12.0 - 15.0 g/dL 7.9  8.2  9.0   Hematocrit 36.0 - 46.0 % 21.8  23.8  25.1   Platelets 150 - 400 K/uL 249  222  259         Latest Ref Rng & Units 12/17/2022    1:09 PM 11/28/2022    9:16 AM 10/29/2022    1:32 PM  CMP  Glucose 70 - 99 mg/dL 100  100  93   BUN 8 - 23 mg/dL '16  17  18   '$ Creatinine 0.44 - 1.00 mg/dL 0.94  0.98  0.91   Sodium 135 - 145 mmol/L 138  142  140   Potassium 3.5 - 5.1 mmol/L 3.4  3.4  3.4   Chloride 98 - 111 mmol/L 103  107  104   CO2 22 - 32 mmol/L '28  30  28   '$ Calcium 8.9 - 10.3 mg/dL 8.9  9.2  9.5   Total Protein 6.5 - 8.1 g/dL 6.8  6.4  6.7   Total Bilirubin 0.3 - 1.2 mg/dL 1.0  0.9  0.9   Alkaline Phos 38 - 126 U/L 77  84  83   AST 15 - 41 U/L '15  13  17   '$ ALT 0 - 44 U/L '16  14  19       '$ RADIOGRAPHIC STUDIES: I have personally reviewed the radiological images as listed and agreed with the findings in the report. No results found.    No orders of the defined types were placed in this encounter.  All questions were answered. The patient knows to call the clinic with any problems, questions or concerns. No barriers to learning was detected. The total time spent in the  appointment was 25 minutes.     Truitt Merle, MD 12/17/2022   Felicity Coyer, CMA, am acting as scribe for Truitt Merle, MD.   I have reviewed the above documentation for accuracy and completeness, and I agree with the above.

## 2022-12-16 NOTE — Assessment & Plan Note (Signed)
IDC, Stage IIB, c(T1c, N1), yp(T0, N1) ER 30% weak+, PR-/HER2-, Grade 3  -diagnosed in 10/2021 -s/p neoadjuvant chemo ACx4, followed by weekly taxol 01/22/22 - 03/20/22, discontinued due to hospitalization for massive PE and cardiogenic shock.  -she took anastrozole 04/17/22 - 06/22/22 due to delay in surgery from hospitalization, discontinued due to arthralgias. -s/p left lumpectomy on 07/10/22 under Dr. Brantley Stage, path showed complete pathologic response in breast but 1/4 positive lymph nodes. Repeat prognostic panel on the positive node showed ER 20% weak, PR 2% moderate, and Her2 negative. -she completed concurrent chemoRT with Xeloda on 10/02/2022 -continue adjuvant Xeloda for 6 months, she is overall tolerating well.

## 2022-12-16 NOTE — Assessment & Plan Note (Signed)
diagnosed in 03/2022 when she was on neoadjuvant chemotherapy -Status post thrombolysis, on Eliquis, tolerating well. -willcontinue Eliquis until she completes chemo, or indefinitely

## 2022-12-17 ENCOUNTER — Telehealth: Payer: Self-pay | Admitting: Nurse Practitioner

## 2022-12-17 ENCOUNTER — Other Ambulatory Visit (HOSPITAL_COMMUNITY): Payer: Self-pay

## 2022-12-17 ENCOUNTER — Other Ambulatory Visit: Payer: Self-pay

## 2022-12-17 ENCOUNTER — Inpatient Hospital Stay (HOSPITAL_BASED_OUTPATIENT_CLINIC_OR_DEPARTMENT_OTHER): Payer: Medicare HMO | Admitting: Hematology

## 2022-12-17 ENCOUNTER — Encounter: Payer: Self-pay | Admitting: Hematology

## 2022-12-17 ENCOUNTER — Inpatient Hospital Stay: Payer: Medicare HMO

## 2022-12-17 ENCOUNTER — Encounter: Payer: Self-pay | Admitting: *Deleted

## 2022-12-17 VITALS — BP 124/64 | HR 83 | Temp 98.3°F | Resp 18 | Ht 65.0 in | Wt 175.8 lb

## 2022-12-17 DIAGNOSIS — Z95828 Presence of other vascular implants and grafts: Secondary | ICD-10-CM

## 2022-12-17 DIAGNOSIS — Z170 Estrogen receptor positive status [ER+]: Secondary | ICD-10-CM | POA: Diagnosis not present

## 2022-12-17 DIAGNOSIS — I2699 Other pulmonary embolism without acute cor pulmonale: Secondary | ICD-10-CM | POA: Diagnosis not present

## 2022-12-17 DIAGNOSIS — D649 Anemia, unspecified: Secondary | ICD-10-CM

## 2022-12-17 DIAGNOSIS — C50212 Malignant neoplasm of upper-inner quadrant of left female breast: Secondary | ICD-10-CM

## 2022-12-17 LAB — CBC WITH DIFFERENTIAL (CANCER CENTER ONLY)
Abs Immature Granulocytes: 0.03 10*3/uL (ref 0.00–0.07)
Basophils Absolute: 0 10*3/uL (ref 0.0–0.1)
Basophils Relative: 0 %
Eosinophils Absolute: 0.5 10*3/uL (ref 0.0–0.5)
Eosinophils Relative: 9 %
HCT: 21.8 % — ABNORMAL LOW (ref 36.0–46.0)
Hemoglobin: 7.9 g/dL — ABNORMAL LOW (ref 12.0–15.0)
Immature Granulocytes: 1 %
Lymphocytes Relative: 14 %
Lymphs Abs: 0.7 10*3/uL (ref 0.7–4.0)
MCH: 36.9 pg — ABNORMAL HIGH (ref 26.0–34.0)
MCHC: 36.2 g/dL — ABNORMAL HIGH (ref 30.0–36.0)
MCV: 101.9 fL — ABNORMAL HIGH (ref 80.0–100.0)
Monocytes Absolute: 0.4 10*3/uL (ref 0.1–1.0)
Monocytes Relative: 7 %
Neutro Abs: 3.7 10*3/uL (ref 1.7–7.7)
Neutrophils Relative %: 69 %
Platelet Count: 249 10*3/uL (ref 150–400)
RBC: 2.14 MIL/uL — ABNORMAL LOW (ref 3.87–5.11)
RDW: 15.9 % — ABNORMAL HIGH (ref 11.5–15.5)
WBC Count: 5.3 10*3/uL (ref 4.0–10.5)
nRBC: 0.4 % — ABNORMAL HIGH (ref 0.0–0.2)

## 2022-12-17 LAB — FOLATE: Folate: >40 ng/mL (ref >5.9)

## 2022-12-17 LAB — RETIC PANEL
Immature Retic Fract: 22.8 % — ABNORMAL HIGH (ref 2.3–15.9)
RBC.: 2.15 MIL/uL — ABNORMAL LOW (ref 3.87–5.11)
Retic Count, Absolute: 28 10*3/uL (ref 19.0–186.0)
Retic Ct Pct: 1.3 % (ref 0.4–3.1)
Reticulocyte Hemoglobin: 46.1 pg (ref >27.9)

## 2022-12-17 LAB — CMP (CANCER CENTER ONLY)
ALT: 16 U/L (ref 0–44)
AST: 15 U/L (ref 15–41)
Albumin: 4.2 g/dL (ref 3.5–5.0)
Alkaline Phosphatase: 77 U/L (ref 38–126)
Anion gap: 7 (ref 5–15)
BUN: 16 mg/dL (ref 8–23)
CO2: 28 mmol/L (ref 22–32)
Calcium: 8.9 mg/dL (ref 8.9–10.3)
Chloride: 103 mmol/L (ref 98–111)
Creatinine: 0.94 mg/dL (ref 0.44–1.00)
GFR, Estimated: >60 mL/min (ref >60)
Glucose, Bld: 100 mg/dL — ABNORMAL HIGH (ref 70–99)
Potassium: 3.4 mmol/L — ABNORMAL LOW (ref 3.5–5.1)
Sodium: 138 mmol/L (ref 135–145)
Total Bilirubin: 1 mg/dL (ref 0.3–1.2)
Total Protein: 6.8 g/dL (ref 6.5–8.1)

## 2022-12-17 LAB — PREPARE RBC (CROSSMATCH)

## 2022-12-17 LAB — SAMPLE TO BLOOD BANK

## 2022-12-17 LAB — VITAMIN B12: Vitamin B-12: 996 pg/mL — ABNORMAL HIGH (ref 180–914)

## 2022-12-17 LAB — FERRITIN: Ferritin: 439 ng/mL — ABNORMAL HIGH (ref 11–307)

## 2022-12-17 MED ORDER — APIXABAN 5 MG PO TABS: ORAL_TABLET | ORAL | 3 refills | Status: DC

## 2022-12-17 MED ORDER — HEPARIN SOD (PORK) LOCK FLUSH 100 UNIT/ML IV SOLN
500.0000 [IU] | Freq: Once | INTRAVENOUS | Status: AC
  Administered 2022-12-17: 500 [IU]

## 2022-12-17 MED ORDER — CAPECITABINE 500 MG PO TABS
ORAL_TABLET | ORAL | 1 refills | Status: DC
  Filled 2022-12-17: qty 84, 21d supply, fill #0
  Filled 2023-01-01: qty 84, 21d supply, fill #1

## 2022-12-17 MED ORDER — SODIUM CHLORIDE 0.9% FLUSH
10.0000 mL | Freq: Once | INTRAVENOUS | Status: AC
  Administered 2022-12-17: 10 mL

## 2022-12-17 NOTE — Progress Notes (Signed)
Per verbal order from Dr. Burr Medico 1 unit of blood ordered to be infused on 2/27. Confirmed T&S and Prepare orders w/Regina in [facility]

## 2022-12-17 NOTE — Telephone Encounter (Signed)
Per 2/26 IB left voicemail for patient to call back.

## 2022-12-17 NOTE — Addendum Note (Signed)
Addended by: Truitt Merle on: 12/17/2022 05:35 PM   Modules accepted: Orders

## 2022-12-18 ENCOUNTER — Inpatient Hospital Stay: Payer: Medicare HMO

## 2022-12-18 VITALS — BP 99/56 | HR 78 | Temp 98.3°F | Resp 16

## 2022-12-18 DIAGNOSIS — Z95828 Presence of other vascular implants and grafts: Secondary | ICD-10-CM

## 2022-12-18 DIAGNOSIS — Z170 Estrogen receptor positive status [ER+]: Secondary | ICD-10-CM

## 2022-12-18 DIAGNOSIS — C50212 Malignant neoplasm of upper-inner quadrant of left female breast: Secondary | ICD-10-CM | POA: Diagnosis not present

## 2022-12-18 DIAGNOSIS — D649 Anemia, unspecified: Secondary | ICD-10-CM

## 2022-12-18 MED ORDER — SODIUM CHLORIDE 0.9% FLUSH
10.0000 mL | Freq: Once | INTRAVENOUS | Status: AC
  Administered 2022-12-18: 10 mL

## 2022-12-18 MED ORDER — HEPARIN SOD (PORK) LOCK FLUSH 100 UNIT/ML IV SOLN
500.0000 [IU] | Freq: Once | INTRAVENOUS | Status: AC
  Administered 2022-12-18: 500 [IU]

## 2022-12-18 MED ORDER — SODIUM CHLORIDE 0.9% IV SOLUTION
250.0000 mL | Freq: Once | INTRAVENOUS | Status: AC
  Administered 2022-12-18: 250 mL via INTRAVENOUS

## 2022-12-18 NOTE — Patient Instructions (Signed)

## 2022-12-19 ENCOUNTER — Inpatient Hospital Stay: Payer: Medicare HMO | Admitting: Nurse Practitioner

## 2022-12-19 ENCOUNTER — Telehealth: Payer: Self-pay | Admitting: Hematology

## 2022-12-19 ENCOUNTER — Other Ambulatory Visit (HOSPITAL_COMMUNITY): Payer: Self-pay

## 2022-12-19 LAB — BPAM RBC
Blood Product Expiration Date: 202403062359
ISSUE DATE / TIME: 202402271009
Unit Type and Rh: 7300

## 2022-12-19 LAB — TYPE AND SCREEN
ABO/RH(D): B POS
Antibody Screen: NEGATIVE
Unit division: 0

## 2022-12-19 NOTE — Telephone Encounter (Signed)
Per 2/28 IB called patient to answer questions about upcoming appointments.

## 2022-12-30 NOTE — Assessment & Plan Note (Signed)
-  secondary to chemo, she received blood transfusion before  -continue monitoring

## 2022-12-30 NOTE — Assessment & Plan Note (Signed)
diagnosed in 03/2022 when she was on neoadjuvant chemotherapy -Status post thrombolysis, on Eliquis, tolerating well. -willcontinue Eliquis until she completes chemo, or indefinitely   

## 2022-12-30 NOTE — Assessment & Plan Note (Signed)
IDC, Stage IIB, c(T1c, N1), yp(T0, N1) ER 30% weak+, PR-/HER2-, Grade 3  -diagnosed in 10/2021 -s/p neoadjuvant chemo ACx4, followed by weekly taxol 01/22/22 - 03/20/22, discontinued due to hospitalization for massive PE and cardiogenic shock.  -she took anastrozole 04/17/22 - 06/22/22 due to delay in surgery from hospitalization, discontinued due to arthralgias. -s/p left lumpectomy on 07/10/22 under Dr. Brantley Stage, path showed complete pathologic response in breast but 1/4 positive lymph nodes. Repeat prognostic panel on the positive node showed ER 20% weak, PR 2% moderate, and Her2 negative. -she completed concurrent chemoRT with Xeloda on 10/02/2022 -continue adjuvant Xeloda for 6 months (until late April), she is overall tolerating well.

## 2022-12-31 ENCOUNTER — Inpatient Hospital Stay: Payer: Medicare HMO

## 2022-12-31 ENCOUNTER — Inpatient Hospital Stay: Payer: Medicare HMO | Attending: Hematology

## 2022-12-31 ENCOUNTER — Inpatient Hospital Stay (HOSPITAL_BASED_OUTPATIENT_CLINIC_OR_DEPARTMENT_OTHER): Payer: Medicare HMO | Admitting: Hematology

## 2022-12-31 ENCOUNTER — Other Ambulatory Visit: Payer: Self-pay

## 2022-12-31 VITALS — BP 124/77 | HR 86 | Temp 98.3°F | Resp 15 | Ht 65.0 in | Wt 179.0 lb

## 2022-12-31 DIAGNOSIS — Z923 Personal history of irradiation: Secondary | ICD-10-CM | POA: Diagnosis not present

## 2022-12-31 DIAGNOSIS — Z86718 Personal history of other venous thrombosis and embolism: Secondary | ICD-10-CM | POA: Insufficient documentation

## 2022-12-31 DIAGNOSIS — Z888 Allergy status to other drugs, medicaments and biological substances status: Secondary | ICD-10-CM | POA: Insufficient documentation

## 2022-12-31 DIAGNOSIS — D649 Anemia, unspecified: Secondary | ICD-10-CM | POA: Diagnosis not present

## 2022-12-31 DIAGNOSIS — C50212 Malignant neoplasm of upper-inner quadrant of left female breast: Secondary | ICD-10-CM | POA: Insufficient documentation

## 2022-12-31 DIAGNOSIS — Z86711 Personal history of pulmonary embolism: Secondary | ICD-10-CM | POA: Diagnosis not present

## 2022-12-31 DIAGNOSIS — I2699 Other pulmonary embolism without acute cor pulmonale: Secondary | ICD-10-CM | POA: Diagnosis not present

## 2022-12-31 DIAGNOSIS — Z95828 Presence of other vascular implants and grafts: Secondary | ICD-10-CM

## 2022-12-31 DIAGNOSIS — Z7901 Long term (current) use of anticoagulants: Secondary | ICD-10-CM | POA: Insufficient documentation

## 2022-12-31 DIAGNOSIS — Z79811 Long term (current) use of aromatase inhibitors: Secondary | ICD-10-CM | POA: Diagnosis not present

## 2022-12-31 DIAGNOSIS — Z170 Estrogen receptor positive status [ER+]: Secondary | ICD-10-CM | POA: Insufficient documentation

## 2022-12-31 DIAGNOSIS — Z79899 Other long term (current) drug therapy: Secondary | ICD-10-CM | POA: Insufficient documentation

## 2022-12-31 DIAGNOSIS — R21 Rash and other nonspecific skin eruption: Secondary | ICD-10-CM | POA: Diagnosis not present

## 2022-12-31 DIAGNOSIS — Z882 Allergy status to sulfonamides status: Secondary | ICD-10-CM | POA: Insufficient documentation

## 2022-12-31 DIAGNOSIS — C773 Secondary and unspecified malignant neoplasm of axilla and upper limb lymph nodes: Secondary | ICD-10-CM | POA: Insufficient documentation

## 2022-12-31 DIAGNOSIS — I10 Essential (primary) hypertension: Secondary | ICD-10-CM | POA: Diagnosis not present

## 2022-12-31 LAB — CBC WITH DIFFERENTIAL (CANCER CENTER ONLY)
Abs Immature Granulocytes: 0.02 10*3/uL (ref 0.00–0.07)
Basophils Absolute: 0 10*3/uL (ref 0.0–0.1)
Basophils Relative: 0 %
Eosinophils Absolute: 0.4 10*3/uL (ref 0.0–0.5)
Eosinophils Relative: 9 %
HCT: 26.9 % — ABNORMAL LOW (ref 36.0–46.0)
Hemoglobin: 9.8 g/dL — ABNORMAL LOW (ref 12.0–15.0)
Immature Granulocytes: 0 %
Lymphocytes Relative: 13 %
Lymphs Abs: 0.6 10*3/uL — ABNORMAL LOW (ref 0.7–4.0)
MCH: 36.4 pg — ABNORMAL HIGH (ref 26.0–34.0)
MCHC: 36.4 g/dL — ABNORMAL HIGH (ref 30.0–36.0)
MCV: 100 fL (ref 80.0–100.0)
Monocytes Absolute: 0.3 10*3/uL (ref 0.1–1.0)
Monocytes Relative: 6 %
Neutro Abs: 3.3 10*3/uL (ref 1.7–7.7)
Neutrophils Relative %: 72 %
Platelet Count: 188 10*3/uL (ref 150–400)
RBC: 2.69 MIL/uL — ABNORMAL LOW (ref 3.87–5.11)
RDW: 15.9 % — ABNORMAL HIGH (ref 11.5–15.5)
WBC Count: 4.6 10*3/uL (ref 4.0–10.5)
nRBC: 0 % (ref 0.0–0.2)

## 2022-12-31 LAB — CMP (CANCER CENTER ONLY)
ALT: 20 U/L (ref 0–44)
AST: 19 U/L (ref 15–41)
Albumin: 4.1 g/dL (ref 3.5–5.0)
Alkaline Phosphatase: 80 U/L (ref 38–126)
Anion gap: 6 (ref 5–15)
BUN: 18 mg/dL (ref 8–23)
CO2: 26 mmol/L (ref 22–32)
Calcium: 9.1 mg/dL (ref 8.9–10.3)
Chloride: 108 mmol/L (ref 98–111)
Creatinine: 0.88 mg/dL (ref 0.44–1.00)
GFR, Estimated: >60 mL/min (ref >60)
Glucose, Bld: 104 mg/dL — ABNORMAL HIGH (ref 70–99)
Potassium: 3.6 mmol/L (ref 3.5–5.1)
Sodium: 140 mmol/L (ref 135–145)
Total Bilirubin: 0.9 mg/dL (ref 0.3–1.2)
Total Protein: 6.6 g/dL (ref 6.5–8.1)

## 2022-12-31 LAB — SAMPLE TO BLOOD BANK

## 2022-12-31 MED ORDER — EXEMESTANE 25 MG PO TABS: 25.0000 mg | ORAL_TABLET | Freq: Every day | ORAL | 2 refills | Status: DC

## 2022-12-31 MED ORDER — HEPARIN SOD (PORK) LOCK FLUSH 100 UNIT/ML IV SOLN
500.0000 [IU] | Freq: Once | INTRAVENOUS | Status: AC
  Administered 2022-12-31: 500 [IU]

## 2022-12-31 MED ORDER — SODIUM CHLORIDE 0.9% FLUSH
10.0000 mL | Freq: Once | INTRAVENOUS | Status: AC
  Administered 2022-12-31: 10 mL

## 2022-12-31 NOTE — Progress Notes (Signed)
[place]   [phone] [phone]   Clinic Follow up Note   Patient Care Team: Everardo Beals, NP as PCP - General Erroll Luna, MD as Consulting Physician (General Surgery) Truitt Merle, MD as Consulting Physician (Hematology) Gery Pray, MD as Consulting Physician (Radiation Oncology) [name], RN as Oncology Nurse Navigator [name], RN as Oncology Nurse Navigator  Date of Service:  12/31/2022  CHIEF COMPLAINT: f/u of left breast cancer     CURRENT THERAPY:    Adjuvant Xeloda, started 08/15/22             -Xeloda dose: '1500mg'$  am and '2000mg'$  pm, day 1-14 every 21 days  ASSESSMENT:  Cassandra Brennan is a 67 y.o. female with   Malignant neoplasm of upper-inner quadrant of left breast in female, estrogen receptor positive ([place]) IDC, Stage IIB, c(T1c, N1), yp(T0, N1) ER 30% weak+, PR-/HER2-, Grade 3  -diagnosed in 10/2021 -s/p neoadjuvant chemo ACx4, followed by weekly taxol 01/22/22 - 03/20/22, discontinued due to hospitalization for massive PE and cardiogenic shock.  -she took anastrozole 04/17/22 - 06/22/22 due to delay in surgery from hospitalization, discontinued due to arthralgias. -s/p left lumpectomy on 07/10/22 under Dr. Brantley Stage, path showed complete pathologic response in breast but 1/4 positive lymph nodes. Repeat prognostic panel on the positive node showed ER 20% weak, PR 2% moderate, and Her2 negative. -she completed concurrent chemoRT with Xeloda on 10/02/2022 -continue adjuvant Xeloda for 6 months (until mid April), she is overall tolerating well.  history of massive pulmonary embolism ([place]) diagnosed in 03/2022 when she was on neoadjuvant chemotherapy -Status post thrombolysis, on Eliquis, tolerating well. -willcontinue Eliquis until she completes chemo    Chronic anemia -secondary to chemo, she received blood transfusion before  -continue monitoring     PLAN: - lab reviewed and Hg improved, she will  continue this cycle Xeloda and start last cycle chemo on 01/13/2023 - start exemestane in late April after next visit, I called in today  - appetite improved - increase activity to maintain weight - f/u, labs in 5 weeks - can remove port in next few month, will message Dr. Alena Bills  - schedule mammogram with [facility] ASAP, she is overdue     SUMMARY OF ONCOLOGIC HISTORY: Oncology History Overview Note   Cancer Staging  Malignant neoplasm of upper-inner quadrant of left breast in female, estrogen receptor positive ([place]) Staging form: Breast, AJCC 8th Edition - Clinical stage from 10/27/2021: Stage IIB (cT1c, cN1, cM0, G3, ER+, PR-, HER2-) - Signed by [name], NP on 11/28/2021 - Pathologic stage from 07/10/2022: No Stage Recommended (ypT0, pN1, cM0, G3, ER+, PR-, HER2-) - Unsigned     Malignant neoplasm of upper-inner quadrant of left breast in female, estrogen receptor positive ([place])  10/19/2021 Mammogram   CLINICAL DATA:  Patient presents for palpable left axillary abnormality.   EXAM: DIGITAL DIAGNOSTIC BILATERAL MAMMOGRAM WITH TOMOSYNTHESIS AND CAD; ULTRASOUND LEFT BREAST LIMITED  IMPRESSION: Suspicious left breast mass 10 o'clock position.   Adjacent suspicious satellite nodule left breast 12 o'clock position.   Palpable mass left axilla corresponds with a large lymph node.   10/27/2021 Initial Biopsy   Diagnosis 1. Breast, left, needle core biopsy, 12 o'clock, ribbon - FIBROADENOMA - NO MALIGNANCY IDENTIFIED 2. Breast, left, needle core biopsy, 10 o'clock, coil - INVASIVE DUCTAL CARCINOMA WITH NECROSIS - DUCTAL CARCINOMA IN SITU - SEE COMMENT 3. Lymph node, needle/core biopsy, left axilla, tribell - INVASIVE DUCTAL CARCINOMA WITH NECROSIS -  NO NODAL TISSUE IDENTIFIED - SEE COMMENT Microscopic Comment 2. and 2. Based on the biopsy, the carcinoma appears Nottingham grade 3 of 3 and measures 0.8 cm in greatest linear extent.  3. PROGNOSTIC INDICATORS Results: The  tumor cells are NEGATIVE for Her2 (1+). Estrogen Receptor: 30%, POSITIVE, WEAK STAINING INTENSITY Progesterone Receptor: 0%, NEGATIVE Proliferation Marker Ki67: 60%   10/27/2021 Cancer Staging   Staging form: Breast, AJCC 8th Edition - Clinical stage from 10/27/2021: Stage IIB (cT1c, cN1, cM0, G3, ER+, PR-, HER2-) - Signed by [name], NP on 11/28/2021 Stage prefix: Initial diagnosis Histologic grading system: 3 grade system   11/06/2021 Initial Diagnosis   Malignant neoplasm of upper-inner quadrant of left breast in female, estrogen receptor positive ([place])   11/15/2021 Breast MRI   IMPRESSION: 1.9 cm mass in the upper-inner quadrant of the left breast corresponding with the known invasive ductal carcinoma. Enlarged left axillary lymph node corresponding with known metastatic disease.   RECOMMENDATION: Treatment planning of the known left breast cancer and axillary metastasis is recommended.   Additional imaging evaluation of the mass in the liver is recommended with CT with liver mass protocol or MRI.   11/22/2021 Imaging   Bone scan IMPRESSION: No scintigraphic evidence of bony metastatic disease. Degenerative changes as above.   11/22/2021 Echocardiogram   Baseline echo LVEF 55-60%, normal GLS -21.7%   11/23/2021 Imaging   CT CAP IMPRESSION: 1. Bulky lymphadenopathy in the left axilla including a 5.0 x 2.8 cm lymph node. 2. 15 mm nodule identified in the upper inner quadrant of the left breast. 3. No suspicious pulmonary nodule or mass. No evidence for metastatic disease in the abdomen or pelvis. 4. 11 mm cyst in the dome of the left liver. Adjacent tiny hypoattenuating lesions in the right liver are too small to characterize, but likely benign. Attention on follow-up recommended. 5. Prominent stool volume raises the question of clinical constipation.   11/28/2021 - 03/20/2022 Chemotherapy   Patient is on Treatment Plan : BREAST ADJUVANT DOSE DENSE AC q14d / PACLitaxel q7d       Genetic Testing   [facility] CancerNext was Negative. Report date is 11/26/2021.   The CancerNext gene panel offered by [employer] includes sequencing, rearrangement analysis, and RNA analysis for the following 36 genes:   APC, ATM, AXIN2, BARD1, BMPR1A, BRCA1, BRCA2, BRIP1, CDH1, CDK4, CDKN2A, CHEK2, DICER1, HOXB13, EPCAM, GREM1, MLH1, MSH2, MSH3, MSH6, MUTYH, NBN, NF1, NTHL1, PALB2, PMS2, POLD1, POLE, PTEN, RAD51C, RAD51D, RECQL, SMAD4, SMARCA4, STK11, and TP53.    07/10/2022 Definitive Surgery   FINAL MICROSCOPIC DIAGNOSIS:   A. BREAST, LEFT, LUMPECTOMY:  - No residual carcinoma identified  - Treatment effect present (see comment)  - Ductal carcinoma in situ present, ypTis  - Margins negative for ductal carcinoma in situ  - See oncology table   B. BREAST, LEFT ADDITIONAL MEDIAL MARGIN, EXCISION:  - Negative for carcinoma   C. BREAST, LEFT ADDITIONAL LATERAL MARGIN, EXCISION:  - Negative for carcinoma   D. LYMPH NODE, LEFT AXILLARY, SENTINEL, EXCISION:  - Positive for carcinoma (1/1)   E. LYMPH NODE, LEFT AXILLARY, SENTINEL, EXCISION:  - Negative for carcinoma (0/1)   F. LYMPH NODE, LEFT, AXILLARY CONTENTS:  - Two lymph nodes identified  - Negative for carcinoma (0/2)    ADDENDUM:  Part D: Left axillary sentinel lymph node,   PROGNOSTIC INDICATOR RESULTS:  The tumor cells are NEGATIVE for Her2 (0).  Estrogen Receptor:       POSITIVE, 20%, WEAK  STAINING  Progesterone Receptor:   POSITIVE, 2%, MODERATE STAINING       INTERVAL HISTORY:   Cassandra Brennan is here for a follow up of  left breast cancer   She was last seen by ME on 12/17/2022. She presents to the clinic alone. Patients hands and feet are dry beginning to peal. Labs improved, patient reports energy level has improved. Patient has dark spots on hands very self conscious of this. Need to schedule mammogram asap. Feels better after last transfusion.     All other systems were reviewed with the patient and are  negative.  MEDICAL HISTORY:  Past Medical History:  Diagnosis Date   Acute dyspnea    Acute metabolic encephalopathy    Anxiety    Arthritis    right knee    Cataract    Elevated troponin    Femoral DVT (deep venous thrombosis) (HCC)    GERD (gastroesophageal reflux disease)    History of radiation therapy    Left breast 08/15/22-10/02/22- Dr. Gery Pray   History of radiation therapy    Left breast- 08/15/22-10/02/22- Dr. Gery Pray   Hyperlipidemia    Hypertension    Lactic acidosis    Obstructive cardiovascular shock ([place]) 03/22/2022   Pneumonia    Positive colorectal cancer screening using Cologuard test    Pulmonary embolism ([place])    03/22/22    SURGICAL HISTORY: Past Surgical History:  Procedure Laterality Date   BREAST CYST EXCISION Right    pt stated in [place] Left 07/10/2022   Procedure: LEFT BREAST LUMPECTOMY WITH RADIOACTIVE SEED AND LEFT  SENTINEL LYMPH NODE MAPPING;  Surgeon: Erroll Luna, MD;  Location: [facility];  Service: General;  Laterality: Left;   BREAST SURGERY     other GI testing     unsure but had to drink a chalky drink    PORTACATH PLACEMENT N/A 11/27/2021   Procedure: INSERTION PORT-A-CATH;  Surgeon: Erroll Luna, MD;  Location: [facility];  Service: General;  Laterality: N/A;   RADIOACTIVE SEED GUIDED AXILLARY SENTINEL LYMPH NODE Left 07/10/2022   Procedure: LEFT AXILLARY SEED LYMPH NODE BIOPSY;  Surgeon: Erroll Luna, MD;  Location: [facility];  Service: General;  Laterality: Left;   TONSILLECTOMY     TUBAL LIGATION      I have reviewed the social history and family history with the patient and they are unchanged from previous note.  ALLERGIES:  is allergic to prednisone, rosuvastatin, guaifenesin, sulfa antibiotics, and other.  MEDICATIONS:  Current Outpatient Medications  Medication Sig Dispense Refill   exemestane (AROMASIN) 25  MG tablet Take 1 tablet (25 mg total) by mouth daily after breakfast. 30 tablet 2   acetaminophen (TYLENOL) 500 MG tablet Take 500 mg by mouth every 8 (eight) hours as needed for moderate pain. (Patient not taking: Reported on 11/08/2022)     apixaban (ELIQUIS) 5 MG TABS tablet TAKE 1 TABLET(5 MG) BY MOUTH TWICE DAILY 60 tablet 3   capecitabine (XELODA) 500 MG tablet Take 3 tablets by mouth in morning and 3 tablets in evening, every 10-12 hours, for 14 days then off for 7 days. Take after meals. 84 tablet 1   lidocaine-prilocaine (EMLA) cream Apply to affected area once 30 g 3   LISINOPRIL PO Take 1 tablet by mouth daily.     metoprolol succinate (TOPROL-XL) 100 MG 24 hr tablet Take 100 mg by mouth daily. Take  with or immediately following a meal.     moxifloxacin (VIGAMOX) 0.5 % ophthalmic solution Place 1 drop into the right eye 4 (four) times daily.     Multiple Vitamins-Minerals (MULTIVITAMIN ADULTS) TABS Take 1 tablet by mouth daily.     ondansetron (ZOFRAN) 8 MG tablet Take 1 tablet (8 mg total) by mouth 2 (two) times daily as needed. Start on the third day after chemotherapy. (Patient not taking: Reported on 11/08/2022) 30 tablet 1   pravastatin (PRAVACHOL) 20 MG tablet Take 1 tablet (20 mg total) by mouth daily. 30 tablet 0   prednisoLONE acetate (PRED FORTE) 1 % ophthalmic suspension Place 1 drop into the right eye 4 (four) times daily.     prochlorperazine (COMPAZINE) 10 MG tablet Take 1 tablet (10 mg total) by mouth every 6 (six) hours as needed (Nausea or vomiting). 30 tablet 1   urea (CARMOL) 10 % cream Apply topically as needed. 71 g 0   No current facility-administered medications for this visit.    PHYSICAL EXAMINATION: ECOG PERFORMANCE STATUS: 1 - Symptomatic but completely ambulatory  Vitals:   12/31/22 1126  BP: 124/77  Pulse: 86  Resp: 15  Temp: 98.3 F (36.8 C)  SpO2: 100%    Wt Readings from Last 3 Encounters:  12/31/22 179 lb (81.2 kg)  12/17/22 175 lb 12.8 oz  (79.7 kg)  11/28/22 174 lb 3.2 oz (79 kg)     GENERAL:alert, no distress and comfortable SKIN: skin color normal, no rashes or significant lesions EYES: normal, Conjunctiva are pink and non-injected, sclera clear  NEURO: alert & oriented x 3 with fluent speech   LABORATORY DATA:  I have reviewed the data as listed    Latest Ref Rng & Units 12/31/2022   11:06 AM 12/17/2022    1:09 PM 11/28/2022    9:16 AM  CBC  WBC 4.0 - 10.5 K/uL 4.6  5.3  4.4   Hemoglobin 12.0 - 15.0 g/dL 9.8  7.9  8.2   Hematocrit 36.0 - 46.0 % 26.9  21.8  23.8   Platelets 150 - 400 K/uL 188  249  222         Latest Ref Rng & Units 12/31/2022   11:06 AM 12/17/2022    1:09 PM 11/28/2022    9:16 AM  CMP  Glucose 70 - 99 mg/dL 104  100  100   BUN 8 - 23 mg/dL '18  16  17   '$ Creatinine 0.44 - 1.00 mg/dL 0.88  0.94  0.98   Sodium 135 - 145 mmol/L 140  138  142   Potassium 3.5 - 5.1 mmol/L 3.6  3.4  3.4   Chloride 98 - 111 mmol/L 108  103  107   CO2 22 - 32 mmol/L '26  28  30   '$ Calcium 8.9 - 10.3 mg/dL 9.1  8.9  9.2   Total Protein 6.5 - 8.1 g/dL 6.6  6.8  6.4   Total Bilirubin 0.3 - 1.2 mg/dL 0.9  1.0  0.9   Alkaline Phos 38 - 126 U/L 80  77  84   AST 15 - 41 U/L '19  15  13   '$ ALT 0 - 44 U/L '20  16  14       '$ RADIOGRAPHIC STUDIES: I have personally reviewed the radiological images as listed and agreed with the findings in the report. No results found.    Orders Placed This Encounter  Procedures   MM 3D DIAGNOSTIC MAMMOGRAM BILATERAL  BREAST    Standing Status:   Future    Standing Expiration Date:   12/31/2023    Order Specific Question:   Reason for Exam (SYMPTOM  OR DIAGNOSIS REQUIRED)    Answer:   screening    Order Specific Question:   Preferred imaging location?    Answer:   [facility]   All questions were answered. The patient knows to call the clinic with any problems, questions or concerns. No barriers to learning was detected. The total time spent in the appointment was 25 minutes.      Truitt Merle, MD 12/31/2022   I, Maurine Simmering, CMA, am acting as scribe for Truitt Merle, MD.   I have reviewed the above documentation for accuracy and completeness, and I agree with the above.

## 2023-01-01 ENCOUNTER — Other Ambulatory Visit (HOSPITAL_COMMUNITY): Payer: Self-pay

## 2023-01-07 ENCOUNTER — Other Ambulatory Visit: Payer: Self-pay

## 2023-01-14 ENCOUNTER — Ambulatory Visit: Payer: Medicare HMO | Attending: Surgery

## 2023-01-14 VITALS — Wt 175.5 lb

## 2023-01-14 DIAGNOSIS — Z483 Aftercare following surgery for neoplasm: Secondary | ICD-10-CM | POA: Insufficient documentation

## 2023-01-14 NOTE — Therapy (Signed)
OUTPATIENT PHYSICAL THERAPY SOZO SCREENING NOTE   Patient Name: Cassandra Brennan MRN: [id number] DOB:[birth year], 67 y.o., female Today's Date: 01/14/2023  PCP: Everardo Beals, NP REFERRING PROVIDER: Everardo Beals, NP   PT End of Session - 01/14/23 1533     Visit Number 11   # unchanged due to screen only   PT Start Time 1531    PT Stop Time 1535    PT Time Calculation (min) 4 min    Activity Tolerance Patient tolerated treatment well    Behavior During Therapy WFL for tasks assessed/performed             Past Medical History:  Diagnosis Date   Acute dyspnea    Acute metabolic encephalopathy    Anxiety    Arthritis    right knee    Cataract    Elevated troponin    Femoral DVT (deep venous thrombosis) (HCC)    GERD (gastroesophageal reflux disease)    History of radiation therapy    Left breast 08/15/22-10/02/22- Dr. Gery Pray   History of radiation therapy    Left breast- 08/15/22-10/02/22- Dr. Gery Pray   Hyperlipidemia    Hypertension    Lactic acidosis    Obstructive cardiovascular shock ([place]) 03/22/2022   Pneumonia    Positive colorectal cancer screening using Cologuard test    Pulmonary embolism ([place])    03/22/22   Past Surgical History:  Procedure Laterality Date   BREAST CYST EXCISION Right    pt stated in [place] Left 07/10/2022   Procedure: LEFT BREAST LUMPECTOMY WITH RADIOACTIVE SEED AND LEFT  SENTINEL LYMPH NODE [place];  Surgeon: Erroll Luna, MD;  Location: [facility];  Service: General;  Laterality: Left;   BREAST SURGERY     other GI testing     unsure but had to drink a chalky drink    PORTACATH PLACEMENT N/A 11/27/2021   Procedure: INSERTION PORT-A-CATH;  Surgeon: Erroll Luna, MD;  [facility];  Service: General;  Laterality: N/A;   RADIOACTIVE SEED GUIDED AXILLARY SENTINEL LYMPH NODE Left 07/10/2022   Procedure: LEFT AXILLARY SEED  LYMPH NODE BIOPSY;  Surgeon: Erroll Luna, MD;  Location: [facility];  Service: General;  Laterality: Left;   TONSILLECTOMY     TUBAL LIGATION     Patient Active Problem List   Diagnosis Date Noted   Right leg DVT ([place]) 03/25/2022   Pneumonia 03/23/2022   AKI (acute kidney injury) ([place]) 03/23/2022   history of massive pulmonary embolism ([place]) 03/22/2022   GERD (gastroesophageal reflux disease)    Chronic anemia    Cardiogenic shock ([place])    Anxiety 12/08/2021   Genetic testing 11/30/2021   Port-A-Cath in place 11/28/2021   Malignant neoplasm of upper-inner quadrant of left breast in female, estrogen receptor positive ([place]) 11/06/2021   Hypertension 03/15/2020    REFERRING DIAG: left breast cancer at risk for lymphedema  THERAPY DIAG: Aftercare following surgery for neoplasm  PERTINENT HISTORY: Patient was diagnosed on 10/19/2022 with left grade III invasive ductal carcinoma breast cancer. It measures 2 cm and is located in the upper inner quadrant. It is weakly ER positive, PR negative and HER2 negative with a Ki67 of 60%. L breast lumpectomy and SLNB on 07/10/22 (1/4), had blood clots in her lung   PRECAUTIONS: left UE Lymphedema risk, None  SUBJECTIVE: Pt returns for her 3 month L-Dex screen.   PAIN:  Are you  having pain? No  SOZO SCREENING: Patient was assessed today using the SOZO machine to determine the lymphedema index score. This was compared to her baseline score. It was determined that she is within the recommended range when compared to her baseline and no further action is needed at this time. She will continue SOZO screenings. These are done every 3 months for 2 years post operatively followed by every 6 months for 2 years, and then annually.   L-DEX FLOWSHEETS - 01/14/23 1500       L-DEX LYMPHEDEMA SCREENING   Measurement Type Unilateral    L-DEX MEASUREMENT EXTREMITY Upper Extremity    POSITION  Standing    DOMINANT SIDE Right    At Risk  Side Left    BASELINE SCORE (UNILATERAL) 9.6    L-DEX SCORE (UNILATERAL) 9.2    VALUE CHANGE (UNILAT) -0.4               Otelia Limes, PTA 01/14/2023, 3:36 PM

## 2023-01-15 ENCOUNTER — Ambulatory Visit
Admission: RE | Admit: 2023-01-15 | Discharge: 2023-01-15 | Disposition: A | Discharge status: Home / Self Care | Payer: Medicare HMO | Source: Ambulatory Visit | Attending: Hematology | Admitting: Hematology

## 2023-01-15 DIAGNOSIS — Z170 Estrogen receptor positive status [ER+]: Secondary | ICD-10-CM

## 2023-01-18 ENCOUNTER — Telehealth: Payer: Self-pay

## 2023-01-18 ENCOUNTER — Other Ambulatory Visit: Payer: Self-pay

## 2023-01-18 ENCOUNTER — Other Ambulatory Visit (HOSPITAL_COMMUNITY): Payer: Self-pay

## 2023-01-18 MED ORDER — UREA 10 % EX CREA: TOPICAL_CREAM | CUTANEOUS | 0 refills | Status: DC | PRN

## 2023-01-18 MED ORDER — TRIAMCINOLONE ACETONIDE 0.025 % EX OINT: 1.0000 | TOPICAL_OINTMENT | Freq: Two times a day (BID) | CUTANEOUS | 0 refills | Status: DC

## 2023-01-18 MED ORDER — TRIAMCINOLONE ACETONIDE 0.025 % EX OINT
1.0000 | TOPICAL_OINTMENT | Freq: Two times a day (BID) | CUTANEOUS | 0 refills | Status: DC
  Filled 2023-01-18: qty 15, 8d supply, fill #0

## 2023-01-18 MED ORDER — UREA 10 % EX CREA
TOPICAL_CREAM | CUTANEOUS | 0 refills | Status: AC | PRN
  Filled 2023-01-18: qty 71, fill #0
  Filled 2023-01-18: qty 71, 30d supply, fill #0

## 2023-01-18 NOTE — Telephone Encounter (Signed)
Pt called c/o of severe pain, blisters, and chaffing of her hands and feet.  Pt stated that Dr. Burr Medico prescribed Urea for her hands and feet but it's not helping but actually has gotten worse.  Pt stated she's unable to walk or do anything with her hands.  Pt stated she's currently taking Capecitabine and wanted to know if Dr. Burr Medico could please call her in something else for her hands and feet.  Informed pt that Dr. Burr Medico is out of the office this week and will not return until Wednesday, 01/23/2023.  Instructed pt to use the Urea with Eucerin and Vitamin E oil.  Stated to apply to hand and feet 2 to 3 times daily along with thick socks and gloves to protect pt's feet.  Consulted with APP regarding Capecitabine and additional interventions.  Instructed pt to stop the Capecitabine for now until Dr. Ernestina Penna office contact pt on 01/23/2023 (Dr. Ernestina Penna return to office).  Prescription for Triamcinolone 0.025% ointment BID for 8 days also sent to pt's pharmacy.  Instructed pt to use a dime size in both hands and feet along with Urea, Eucerin, and Vitamin E oil.  Instructed pt to wear thick socks and gloves after each application.  Informed pt that this RN will contact her on 01/23/2023 when Dr. Burr Medico returns to office to follow-up on her hands and feet.  Pt verbalized understanding.  Pt requested a refill on the Urea d/t pt used last dose yesterday 01/17/2023.  Refill sent to pt's preferred pharmacy.  Pt had no further questions or concerns.  Pt called back stating [facility] (pt's preferred pharmacy) is closed today d/t holiday.  Pt requested if refill and prescription for Triamcinolone could be sent to [place].  Sent prescriptions to [facility].

## 2023-01-23 ENCOUNTER — Other Ambulatory Visit: Payer: Self-pay

## 2023-01-23 ENCOUNTER — Other Ambulatory Visit: Payer: Self-pay | Admitting: Hematology

## 2023-01-23 MED ORDER — GABAPENTIN 100 MG PO CAPS: 100.0000 mg | ORAL_CAPSULE | Freq: Every day | ORAL | 1 refills | Status: DC

## 2023-01-24 ENCOUNTER — Telehealth: Payer: Self-pay

## 2023-01-24 NOTE — Telephone Encounter (Signed)
Late Entry:  Spoke with pt via telephone on 01/23/2023 to f/u on pt's hand & foot Syndrome from Capecitabine.  Pt stated the Triamcinolone Ointment is helping and is now able to walk and wear shoes without much discomfort.  Pt also stated that her hands are getting much better as well.  Pt did c/o of severe burning in her feet at night while lying in bed.  Pt stated it only happens at night.  Informed pt that is most likely neuropathy.  Stated that this RN will speak with Dr. Burr Medico regarding this and will f/u with pt on what Dr. Burr Medico recommends.  Returned call to pt to let pt know that Dr. Burr Medico prescribed her Gabapentin 100mg  for her to take only at night.  If the 100mg  of Gabapentin does not help per Dr. Ernestina Penna order pt may increase to 200mg  at night.  Pt verbalized understanding and had no further questions or concerns.

## 2023-01-29 ENCOUNTER — Other Ambulatory Visit (HOSPITAL_COMMUNITY): Payer: Self-pay

## 2023-02-04 ENCOUNTER — Inpatient Hospital Stay (HOSPITAL_BASED_OUTPATIENT_CLINIC_OR_DEPARTMENT_OTHER): Payer: Medicare HMO | Admitting: Hematology

## 2023-02-04 ENCOUNTER — Other Ambulatory Visit (HOSPITAL_COMMUNITY): Payer: Self-pay

## 2023-02-04 ENCOUNTER — Inpatient Hospital Stay: Payer: Medicare HMO | Attending: Hematology

## 2023-02-04 ENCOUNTER — Other Ambulatory Visit: Payer: Self-pay

## 2023-02-04 ENCOUNTER — Encounter: Payer: Self-pay | Admitting: Hematology

## 2023-02-04 VITALS — BP 131/73 | HR 95 | Temp 98.4°F | Resp 13 | Wt 178.7 lb

## 2023-02-04 DIAGNOSIS — D649 Anemia, unspecified: Secondary | ICD-10-CM

## 2023-02-04 DIAGNOSIS — Z86718 Personal history of other venous thrombosis and embolism: Secondary | ICD-10-CM | POA: Insufficient documentation

## 2023-02-04 DIAGNOSIS — C50212 Malignant neoplasm of upper-inner quadrant of left female breast: Secondary | ICD-10-CM

## 2023-02-04 DIAGNOSIS — Z882 Allergy status to sulfonamides status: Secondary | ICD-10-CM | POA: Diagnosis not present

## 2023-02-04 DIAGNOSIS — M2550 Pain in unspecified joint: Secondary | ICD-10-CM | POA: Insufficient documentation

## 2023-02-04 DIAGNOSIS — Z79811 Long term (current) use of aromatase inhibitors: Secondary | ICD-10-CM | POA: Insufficient documentation

## 2023-02-04 DIAGNOSIS — Z923 Personal history of irradiation: Secondary | ICD-10-CM | POA: Diagnosis not present

## 2023-02-04 DIAGNOSIS — Z79899 Other long term (current) drug therapy: Secondary | ICD-10-CM | POA: Diagnosis not present

## 2023-02-04 DIAGNOSIS — Z170 Estrogen receptor positive status [ER+]: Secondary | ICD-10-CM | POA: Insufficient documentation

## 2023-02-04 DIAGNOSIS — Z9851 Tubal ligation status: Secondary | ICD-10-CM | POA: Diagnosis not present

## 2023-02-04 DIAGNOSIS — I10 Essential (primary) hypertension: Secondary | ICD-10-CM | POA: Diagnosis not present

## 2023-02-04 DIAGNOSIS — Z7901 Long term (current) use of anticoagulants: Secondary | ICD-10-CM | POA: Diagnosis not present

## 2023-02-04 DIAGNOSIS — Z888 Allergy status to other drugs, medicaments and biological substances status: Secondary | ICD-10-CM | POA: Diagnosis not present

## 2023-02-04 DIAGNOSIS — I2699 Other pulmonary embolism without acute cor pulmonale: Secondary | ICD-10-CM | POA: Diagnosis not present

## 2023-02-04 DIAGNOSIS — Z86711 Personal history of pulmonary embolism: Secondary | ICD-10-CM | POA: Insufficient documentation

## 2023-02-04 DIAGNOSIS — Z95828 Presence of other vascular implants and grafts: Secondary | ICD-10-CM

## 2023-02-04 DIAGNOSIS — K219 Gastro-esophageal reflux disease without esophagitis: Secondary | ICD-10-CM | POA: Insufficient documentation

## 2023-02-04 LAB — CMP (CANCER CENTER ONLY)
ALT: 23 U/L (ref 0–44)
AST: 23 U/L (ref 15–41)
Albumin: 4 g/dL (ref 3.5–5.0)
Alkaline Phosphatase: 82 U/L (ref 38–126)
Anion gap: 6 (ref 5–15)
BUN: 16 mg/dL (ref 8–23)
CO2: 26 mmol/L (ref 22–32)
Calcium: 9.3 mg/dL (ref 8.9–10.3)
Chloride: 108 mmol/L (ref 98–111)
Creatinine: 0.89 mg/dL (ref 0.44–1.00)
GFR, Estimated: >60 mL/min (ref >60)
Glucose, Bld: 134 mg/dL — ABNORMAL HIGH (ref 70–99)
Potassium: 3.5 mmol/L (ref 3.5–5.1)
Sodium: 140 mmol/L (ref 135–145)
Total Bilirubin: 0.7 mg/dL (ref 0.3–1.2)
Total Protein: 6.8 g/dL (ref 6.5–8.1)

## 2023-02-04 LAB — CBC WITH DIFFERENTIAL (CANCER CENTER ONLY)
Abs Immature Granulocytes: 0.01 10*3/uL (ref 0.00–0.07)
Basophils Absolute: 0 10*3/uL (ref 0.0–0.1)
Basophils Relative: 1 %
Eosinophils Absolute: 0.4 10*3/uL (ref 0.0–0.5)
Eosinophils Relative: 7 %
HCT: 27.3 % — ABNORMAL LOW (ref 36.0–46.0)
Hemoglobin: 9.6 g/dL — ABNORMAL LOW (ref 12.0–15.0)
Immature Granulocytes: 0 %
Lymphocytes Relative: 15 %
Lymphs Abs: 0.8 10*3/uL (ref 0.7–4.0)
MCH: 35 pg — ABNORMAL HIGH (ref 26.0–34.0)
MCHC: 35.2 g/dL (ref 30.0–36.0)
MCV: 99.6 fL (ref 80.0–100.0)
Monocytes Absolute: 0.3 10*3/uL (ref 0.1–1.0)
Monocytes Relative: 6 %
Neutro Abs: 3.6 10*3/uL (ref 1.7–7.7)
Neutrophils Relative %: 71 %
Platelet Count: 200 10*3/uL (ref 150–400)
RBC: 2.74 MIL/uL — ABNORMAL LOW (ref 3.87–5.11)
RDW: 14.3 % (ref 11.5–15.5)
WBC Count: 5.1 10*3/uL (ref 4.0–10.5)
nRBC: 0 % (ref 0.0–0.2)

## 2023-02-04 LAB — SAMPLE TO BLOOD BANK

## 2023-02-04 MED ORDER — HEPARIN SOD (PORK) LOCK FLUSH 100 UNIT/ML IV SOLN
500.0000 [IU] | Freq: Once | INTRAVENOUS | Status: AC
  Administered 2023-02-04: 500 [IU]

## 2023-02-04 MED ORDER — SODIUM CHLORIDE 0.9% FLUSH
10.0000 mL | Freq: Once | INTRAVENOUS | Status: AC
  Administered 2023-02-04: 10 mL

## 2023-02-04 NOTE — Assessment & Plan Note (Signed)
diagnosed in 03/2022 when she was on neoadjuvant chemotherapy -Status post thrombolysis, on Eliquis, tolerating well. -willcontinue Eliquis until she completes chemo

## 2023-02-04 NOTE — Assessment & Plan Note (Signed)
-  secondary to chemo, she received blood transfusion before  -continue monitoring  

## 2023-02-04 NOTE — Assessment & Plan Note (Signed)
IDC, Stage IIB, c(T1c, N1), yp(T0, N1) ER 30% weak+, PR-/HER2-, Grade 3  -diagnosed in 10/2021 -s/p neoadjuvant chemo ACx4, followed by weekly taxol 01/22/22 - 03/20/22, discontinued due to hospitalization for massive PE and cardiogenic shock.  -she took anastrozole 04/17/22 - 06/22/22 due to delay in surgery from hospitalization, discontinued due to arthralgias. -s/p left lumpectomy on 07/10/22 under Dr. Luisa Hart, path showed complete pathologic response in breast but 1/4 positive lymph nodes. Repeat prognostic panel on the positive node showed ER 20% weak, PR 2% moderate, and Her2 negative. -she completed concurrent chemoRT with Xeloda on 10/02/2022 -continue adjuvant Xeloda for 6 months (until mid April), she is overall tolerating well.

## 2023-02-04 NOTE — Progress Notes (Signed)
[facility]   [phone] [phone]   Clinic Follow up Note   Patient Care Team: Marva Panda, NP as PCP - General Harriette Bouillon, MD as Consulting Physician (General Surgery) Malachy Mood, MD as Consulting Physician (Hematology) Antony Blackbird, MD as Consulting Physician (Radiation Oncology) Pershing Proud, RN as Oncology Nurse Navigator Donnelly Angelica, RN as Oncology Nurse Navigator  Date of Service:  02/04/2023  CHIEF COMPLAINT: f/u of ft breast cancer     CURRENT THERAPY:  Exemestane starting 57846    ASSESSMENT:  Cassandra Brennan is a 67 y.o. female with   Malignant neoplasm of upper-inner quadrant of left breast in female, estrogen receptor positive (HCC) IDC, Stage IIB, c(T1c, N1), yp(T0, N1) ER 30% weak+, PR-/HER2-, Grade 3  -diagnosed in 10/2021 -s/p neoadjuvant chemo ACx4, followed by weekly taxol 01/22/22 - 03/20/22, discontinued due to hospitalization for massive PE and cardiogenic shock.  -she took anastrozole 04/17/22 - 06/22/22 due to delay in surgery from hospitalization, discontinued due to arthralgias. -s/p left lumpectomy on 07/10/22 under Dr. Luisa Hart, path showed complete pathologic response in breast but 1/4 positive lymph nodes. Repeat prognostic panel on the positive node showed ER 20% weak, PR 2% moderate, and Her2 negative. -she completed concurrent chemoRT with Xeloda on 10/02/2022 -continue adjuvant Xeloda for 6 months (until mid April).  She had a severe skin toxicity from Xeloda during the last cycle a few weeks ago, so we stopped her Xeloda earlier.  She is recovering well. -She has exemestane, has not started yet, I recommend her starting in 2 to 3 weeks. -Discussed cancer surveillance, and what to watch at home.  history of massive pulmonary embolism (HCC) diagnosed in 03/2022 when she was on neoadjuvant chemotherapy -Status post thrombolysis, on Eliquis, tolerating well. 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, she received blood transfusion before  -continue monitoring        PLAN: -start the Exemestane in 02/5023 so pt can recover from hand and foot syndrome -discontinued the Xeloda (she stopped last cycle earlier)  -lab reviewed -mild anemia, overall imp[roved  -lab/f/u in 3 months/Survivorship with Lacie     SUMMARY OF ONCOLOGIC HISTORY: Oncology History Overview Note   Cancer Staging  Malignant neoplasm of upper-inner quadrant of left breast in female, estrogen receptor positive (HCC) Staging form: Breast, AJCC 8th Edition - Clinical stage from 10/27/2021: Stage IIB (cT1c, cN1, cM0, G3, ER+, PR-, HER2-) - Signed by Pollyann Samples, NP on 11/28/2021 - Pathologic stage from 07/10/2022: No Stage Recommended (ypT0, pN1, cM0, G3, ER+, PR-, HER2-) - Unsigned     Malignant neoplasm of upper-inner quadrant of left breast in female, estrogen receptor positive  10/19/2021 Mammogram   CLINICAL DATA:  Patient presents for palpable left axillary abnormality.   EXAM: DIGITAL DIAGNOSTIC BILATERAL MAMMOGRAM WITH TOMOSYNTHESIS AND CAD; ULTRASOUND LEFT BREAST LIMITED  IMPRESSION: Suspicious left breast mass 10 o'clock position.   Adjacent suspicious satellite nodule left breast 12 o'clock position.   Palpable mass left axilla corresponds with a large lymph node.   10/27/2021 Initial Biopsy   Diagnosis 1. Breast, left, needle core biopsy, 12 o'clock, ribbon - FIBROADENOMA - NO MALIGNANCY IDENTIFIED 2. Breast, left, needle core biopsy, 10 o'clock, coil - INVASIVE DUCTAL CARCINOMA WITH NECROSIS - DUCTAL CARCINOMA IN SITU - SEE COMMENT 3. Lymph node, needle/core biopsy, left axilla, tribell - INVASIVE DUCTAL CARCINOMA WITH NECROSIS - NO NODAL TISSUE IDENTIFIED - SEE COMMENT Microscopic Comment  2. and 2. Based on the biopsy, the carcinoma appears Nottingham grade 3 of 3 and measures 0.8 cm in greatest linear extent.  3. PROGNOSTIC  INDICATORS Results: The tumor cells are NEGATIVE for Her2 (1+). Estrogen Receptor: 30%, POSITIVE, WEAK STAINING INTENSITY Progesterone Receptor: 0%, NEGATIVE Proliferation Marker Ki67: 60%   10/27/2021 Cancer Staging   Staging form: Breast, AJCC 8th Edition - Clinical stage from 10/27/2021: Stage IIB (cT1c, cN1, cM0, G3, ER+, PR-, HER2-) - Signed by Pollyann Samples, NP on 11/28/2021 Stage prefix: Initial diagnosis Histologic grading system: 3 grade system   11/06/2021 Initial Diagnosis   Malignant neoplasm of upper-inner quadrant of left breast in female, estrogen receptor positive (HCC)   11/15/2021 Breast MRI   IMPRESSION: 1.9 cm mass in the upper-inner quadrant of the left breast corresponding with the known invasive ductal carcinoma. Enlarged left axillary lymph node corresponding with known metastatic disease.   RECOMMENDATION: Treatment planning of the known left breast cancer and axillary metastasis is recommended.   Additional imaging evaluation of the mass in the liver is recommended with CT with liver mass protocol or MRI.   11/22/2021 Imaging   Bone scan IMPRESSION: No scintigraphic evidence of bony metastatic disease. Degenerative changes as above.   11/22/2021 Echocardiogram   Baseline echo LVEF 55-60%, normal GLS -21.7%   11/23/2021 Imaging   CT CAP IMPRESSION: 1. Bulky lymphadenopathy in the left axilla including a 5.0 x 2.8 cm lymph node. 2. 15 mm nodule identified in the upper inner quadrant of the left breast. 3. No suspicious pulmonary nodule or mass. No evidence for metastatic disease in the abdomen or pelvis. 4. 11 mm cyst in the dome of the left liver. Adjacent tiny hypoattenuating lesions in the right liver are too small to characterize, but likely benign. Attention on follow-up recommended. 5. Prominent stool volume raises the question of clinical constipation.   11/28/2021 - 03/20/2022 Chemotherapy   Patient is on Treatment Plan : BREAST ADJUVANT DOSE DENSE AC q14d /  PACLitaxel q7d      Genetic Testing   [facility] was Negative. Report date is 11/26/2021.   The CancerNext gene panel offered by [employer] includes sequencing, rearrangement analysis, and RNA analysis for the following 36 genes:   APC, ATM, AXIN2, BARD1, BMPR1A, BRCA1, BRCA2, BRIP1, CDH1, CDK4, CDKN2A, CHEK2, DICER1, HOXB13, EPCAM, GREM1, MLH1, MSH2, MSH3, MSH6, MUTYH, NBN, NF1, NTHL1, PALB2, PMS2, POLD1, POLE, PTEN, RAD51C, RAD51D, RECQL, SMAD4, SMARCA4, STK11, and TP53.    07/10/2022 Definitive Surgery   FINAL MICROSCOPIC DIAGNOSIS:   A. BREAST, LEFT, LUMPECTOMY:  - No residual carcinoma identified  - Treatment effect present (see comment)  - Ductal carcinoma in situ present, ypTis  - Margins negative for ductal carcinoma in situ  - See oncology table   B. BREAST, LEFT ADDITIONAL MEDIAL MARGIN, EXCISION:  - Negative for carcinoma   C. BREAST, LEFT ADDITIONAL LATERAL MARGIN, EXCISION:  - Negative for carcinoma   D. LYMPH NODE, LEFT AXILLARY, SENTINEL, EXCISION:  - Positive for carcinoma (1/1)   E. LYMPH NODE, LEFT AXILLARY, SENTINEL, EXCISION:  - Negative for carcinoma (0/1)   F. LYMPH NODE, LEFT, AXILLARY CONTENTS:  - Two lymph nodes identified  - Negative for carcinoma (0/2)    ADDENDUM:  Part D: Left axillary sentinel lymph node,   PROGNOSTIC INDICATOR RESULTS:  The tumor cells are NEGATIVE for Her2 (0).  Estrogen Receptor:       POSITIVE, 20%, WEAK STAINING  Progesterone Receptor:   POSITIVE, 2%, MODERATE  STAINING       INTERVAL HISTORY:  Cassandra Brennan is here for a follow up of ft breast cancer . She was last seen by me on 12/31/2022. She presents to the clinic alone. Pt reports that her hands and feet are doing a little better from taking the last cycle of the Xeloda. Pt stated that she couldn't walk, felt like her feet were on fire. Pt just moved and she trying to get settled. Pt stated that she has been having mood swings.    All other systems were  reviewed with the patient and are negative.  MEDICAL HISTORY:  Past Medical History:  Diagnosis Date   Acute dyspnea    Acute metabolic encephalopathy    Anxiety    Arthritis    right knee    Cataract    Elevated troponin    Femoral DVT (deep venous thrombosis)    GERD (gastroesophageal reflux disease)    History of radiation therapy    Left breast 08/15/22-10/02/22- Dr. Antony Blackbird   History of radiation therapy    Left breast- 08/15/22-10/02/22- Dr. Antony Blackbird   Hyperlipidemia    Hypertension    Lactic acidosis    Obstructive cardiovascular shock 03/22/2022   Pneumonia    Positive colorectal cancer screening using Cologuard test    Pulmonary embolism    03/22/22    SURGICAL HISTORY: Past Surgical History:  Procedure Laterality Date   BREAST CYST EXCISION Right    pt stated in 1980   BREAST LUMPECTOMY WITH RADIOACTIVE SEED AND SENTINEL LYMPH NODE BIOPSY Left 07/10/2022   Procedure: LEFT BREAST LUMPECTOMY WITH RADIOACTIVE SEED AND LEFT  SENTINEL LYMPH NODE MAPPING;  Surgeon: Harriette Bouillon, MD;  Location: [facility];  Service: General;  Laterality: Left;   BREAST SURGERY     other GI testing     unsure but had to drink a chalky drink    PORTACATH PLACEMENT N/A 11/27/2021   Procedure: INSERTION PORT-A-CATH;  Surgeon: Harriette Bouillon, MD;  Location: [facility];  Service: General;  Laterality: N/A;   RADIOACTIVE SEED GUIDED AXILLARY SENTINEL LYMPH NODE Left 07/10/2022   Procedure: LEFT AXILLARY SEED LYMPH NODE BIOPSY;  Surgeon: Harriette Bouillon, MD;  Location: [facility];  Service: General;  Laterality: Left;   TONSILLECTOMY     TUBAL LIGATION      I have reviewed the social history and family history with the patient and they are unchanged from previous note.  ALLERGIES:  is allergic to prednisone, rosuvastatin, guaifenesin, sulfa antibiotics, and other.  MEDICATIONS:  Current Outpatient Medications  Medication Sig Dispense Refill    acetaminophen (TYLENOL) 500 MG tablet Take 500 mg by mouth every 8 (eight) hours as needed for moderate pain. (Patient not taking: Reported on 11/08/2022)     apixaban (ELIQUIS) 5 MG TABS tablet TAKE 1 TABLET(5 MG) BY MOUTH TWICE DAILY 60 tablet 3   exemestane (AROMASIN) 25 MG tablet Take 1 tablet (25 mg total) by mouth daily after breakfast. 30 tablet 2   gabapentin (NEURONTIN) 100 MG capsule Take 1 capsule (100 mg total) by mouth at bedtime. OK to increase to 2 capsule at bed time if needed in a 1-2 weeks 30 capsule 1   lidocaine-prilocaine (EMLA) cream Apply to affected area once 30 g 3   LISINOPRIL PO Take 1 tablet by mouth daily.     metoprolol succinate (TOPROL-XL) 100 MG 24 hr tablet Take 100 mg by mouth daily. Take with or immediately following  a meal.     moxifloxacin (VIGAMOX) 0.5 % ophthalmic solution Place 1 drop into the right eye 4 (four) times daily.     Multiple Vitamins-Minerals (MULTIVITAMIN ADULTS) TABS Take 1 tablet by mouth daily.     ondansetron (ZOFRAN) 8 MG tablet Take 1 tablet (8 mg total) by mouth 2 (two) times daily as needed. Start on the third day after chemotherapy. (Patient not taking: Reported on 11/08/2022) 30 tablet 1   pravastatin (PRAVACHOL) 20 MG tablet Take 1 tablet (20 mg total) by mouth daily. 30 tablet 0   prednisoLONE acetate (PRED FORTE) 1 % ophthalmic suspension Place 1 drop into the right eye 4 (four) times daily.     prochlorperazine (COMPAZINE) 10 MG tablet Take 1 tablet (10 mg total) by mouth every 6 (six) hours as needed (Nausea or vomiting). 30 tablet 1   triamcinolone (KENALOG) 0.025 % ointment Apply 1 Application topically 2 (two) times daily. Apply a dime size to both hands and feet 2 times daily for 8 days. 15 g 0   urea (CARMOL) 10 % cream Apply topically as needed. 71 g 0   No current facility-administered medications for this visit.    PHYSICAL EXAMINATION: ECOG PERFORMANCE STATUS: 1 - Symptomatic but completely ambulatory  Vitals:    02/04/23 1404  BP: 131/73  Pulse: 95  Resp: 13  Temp: 98.4 F (36.9 C)  SpO2: 100%   Wt Readings from Last 3 Encounters:  02/04/23 178 lb 11.2 oz (81.1 kg)  01/14/23 175 lb 8 oz (79.6 kg)  12/31/22 179 lb (81.2 kg)     GENERAL:alert, no distress and comfortable SKIN: skin color normal, no rashes or significant lesions EYES: normal, Conjunctiva are pink and non-injected, sclera clear  NEURO: alert & oriented x 3 with fluent speech  LABORATORY DATA:  I have reviewed the data as listed    Latest Ref Rng & Units 02/04/2023    1:39 PM 12/31/2022   11:06 AM 12/17/2022    1:09 PM  CBC  WBC 4.0 - 10.5 K/uL 5.1  4.6  5.3   Hemoglobin 12.0 - 15.0 g/dL 9.6  9.8  7.9   Hematocrit 36.0 - 46.0 % 27.3  26.9  21.8   Platelets 150 - 400 K/uL 200  188  249         Latest Ref Rng & Units 12/31/2022   11:06 AM 12/17/2022    1:09 PM 11/28/2022    9:16 AM  CMP  Glucose 70 - 99 mg/dL 161  096  045   BUN 8 - 23 mg/dL 18  16  17    Creatinine 0.44 - 1.00 mg/dL 4.09  8.11  9.14   Sodium 135 - 145 mmol/L 140  138  142   Potassium 3.5 - 5.1 mmol/L 3.6  3.4  3.4   Chloride 98 - 111 mmol/L 108  103  107   CO2 22 - 32 mmol/L 26  28  30    Calcium 8.9 - 10.3 mg/dL 9.1  8.9  9.2   Total Protein 6.5 - 8.1 g/dL 6.6  6.8  6.4   Total Bilirubin 0.3 - 1.2 mg/dL 0.9  1.0  0.9   Alkaline Phos 38 - 126 U/L 80  77  84   AST 15 - 41 U/L 19  15  13    ALT 0 - 44 U/L 20  16  14        RADIOGRAPHIC STUDIES: I have personally reviewed the radiological images as listed  and agreed with the findings in the report. No results found.    No orders of the defined types were placed in this encounter.  All questions were answered. The patient knows to call the clinic with any problems, questions or concerns. No barriers to learning was detected. The total time spent in the appointment was 25 minutes.     Malachy Mood, MD 02/04/2023   Carolin Coy, CMA, am acting as scribe for Malachy Mood, MD.   I have reviewed  the above documentation for accuracy and completeness, and I agree with the above.

## 2023-03-19 ENCOUNTER — Encounter: Payer: Self-pay | Admitting: Nurse Practitioner

## 2023-03-19 ENCOUNTER — Inpatient Hospital Stay: Payer: Medicare HMO | Attending: Hematology | Admitting: Nurse Practitioner

## 2023-03-19 VITALS — BP 139/81 | HR 78 | Temp 98.4°F | Resp 17 | Wt 175.8 lb

## 2023-03-19 DIAGNOSIS — Z8051 Family history of malignant neoplasm of kidney: Secondary | ICD-10-CM | POA: Insufficient documentation

## 2023-03-19 DIAGNOSIS — Z170 Estrogen receptor positive status [ER+]: Secondary | ICD-10-CM | POA: Diagnosis not present

## 2023-03-19 DIAGNOSIS — Z800 Family history of malignant neoplasm of digestive organs: Secondary | ICD-10-CM | POA: Diagnosis not present

## 2023-03-19 DIAGNOSIS — Z86718 Personal history of other venous thrombosis and embolism: Secondary | ICD-10-CM | POA: Diagnosis not present

## 2023-03-19 DIAGNOSIS — C50212 Malignant neoplasm of upper-inner quadrant of left female breast: Secondary | ICD-10-CM | POA: Insufficient documentation

## 2023-03-19 DIAGNOSIS — Z803 Family history of malignant neoplasm of breast: Secondary | ICD-10-CM | POA: Diagnosis not present

## 2023-03-19 DIAGNOSIS — Z882 Allergy status to sulfonamides status: Secondary | ICD-10-CM | POA: Diagnosis not present

## 2023-03-19 DIAGNOSIS — Z83719 Family history of colon polyps, unspecified: Secondary | ICD-10-CM | POA: Insufficient documentation

## 2023-03-19 DIAGNOSIS — Z9221 Personal history of antineoplastic chemotherapy: Secondary | ICD-10-CM | POA: Insufficient documentation

## 2023-03-19 DIAGNOSIS — Z79811 Long term (current) use of aromatase inhibitors: Secondary | ICD-10-CM | POA: Insufficient documentation

## 2023-03-19 DIAGNOSIS — Z86711 Personal history of pulmonary embolism: Secondary | ICD-10-CM | POA: Diagnosis not present

## 2023-03-19 DIAGNOSIS — Z7901 Long term (current) use of anticoagulants: Secondary | ICD-10-CM | POA: Diagnosis not present

## 2023-03-19 DIAGNOSIS — Z923 Personal history of irradiation: Secondary | ICD-10-CM | POA: Insufficient documentation

## 2023-03-19 DIAGNOSIS — Z79899 Other long term (current) drug therapy: Secondary | ICD-10-CM | POA: Diagnosis not present

## 2023-03-19 DIAGNOSIS — M791 Myalgia, unspecified site: Secondary | ICD-10-CM | POA: Insufficient documentation

## 2023-03-19 DIAGNOSIS — I10 Essential (primary) hypertension: Secondary | ICD-10-CM | POA: Insufficient documentation

## 2023-03-19 DIAGNOSIS — Z808 Family history of malignant neoplasm of other organs or systems: Secondary | ICD-10-CM | POA: Insufficient documentation

## 2023-03-19 DIAGNOSIS — Z888 Allergy status to other drugs, medicaments and biological substances status: Secondary | ICD-10-CM | POA: Insufficient documentation

## 2023-03-19 DIAGNOSIS — C773 Secondary and unspecified malignant neoplasm of axilla and upper limb lymph nodes: Secondary | ICD-10-CM | POA: Insufficient documentation

## 2023-03-19 NOTE — Progress Notes (Signed)
CLINIC:  Survivorship   Patient Care Team: Marva Panda, NP as PCP - General Harriette Bouillon, MD as Consulting Physician (General Surgery) Malachy Mood, MD as Consulting Physician (Hematology) Antony Blackbird, MD as Consulting Physician (Radiation Oncology) Pershing Proud, RN as Oncology Nurse Navigator Donnelly Angelica, RN as Oncology Nurse Navigator Pollyann Samples, NP as Nurse Practitioner (Nurse Practitioner)   REASON FOR VISIT:  Routine follow-up post-treatment for a recent history of breast cancer.  BRIEF ONCOLOGIC HISTORY:  Oncology History Overview Note   Cancer Staging  Malignant neoplasm of upper-inner quadrant of left breast in female, estrogen receptor positive (HCC) Staging form: Breast, AJCC 8th Edition - Clinical stage from 10/27/2021: Stage IIB (cT1c, cN1, cM0, G3, ER+, PR-, HER2-) - Signed by Pollyann Samples, NP on 11/28/2021 - Pathologic stage from 07/10/2022: No Stage Recommended (ypT0, pN1, cM0, G3, ER+, PR-, HER2-) - Unsigned     Malignant neoplasm of upper-inner quadrant of left breast in female, estrogen receptor positive (HCC)  10/19/2021 Mammogram   CLINICAL DATA:  Patient presents for palpable left axillary abnormality.   EXAM: DIGITAL DIAGNOSTIC BILATERAL MAMMOGRAM WITH TOMOSYNTHESIS AND CAD; ULTRASOUND LEFT BREAST LIMITED  IMPRESSION: Suspicious left breast mass 10 o'clock position.   Adjacent suspicious satellite nodule left breast 12 o'clock position.   Palpable mass left axilla corresponds with a large lymph node.   10/27/2021 Initial Biopsy   Diagnosis 1. Breast, left, needle core biopsy, 12 o'clock, ribbon - FIBROADENOMA - NO MALIGNANCY IDENTIFIED 2. Breast, left, needle core biopsy, 10 o'clock, coil - INVASIVE DUCTAL CARCINOMA WITH NECROSIS - DUCTAL CARCINOMA IN SITU - SEE COMMENT 3. Lymph node, needle/core biopsy, left axilla, tribell - INVASIVE DUCTAL CARCINOMA WITH NECROSIS - NO NODAL TISSUE IDENTIFIED - SEE  COMMENT Microscopic Comment 2. and 2. Based on the biopsy, the carcinoma appears Nottingham grade 3 of 3 and measures 0.8 cm in greatest linear extent.  3. PROGNOSTIC INDICATORS Results: The tumor cells are NEGATIVE for Her2 (1+). Estrogen Receptor: 30%, POSITIVE, WEAK STAINING INTENSITY Progesterone Receptor: 0%, NEGATIVE Proliferation Marker Ki67: 60%   10/27/2021 Cancer Staging   Staging form: Breast, AJCC 8th Edition - Clinical stage from 10/27/2021: Stage IIB (cT1c, cN1, cM0, G3, ER+, PR-, HER2-) - Signed by Pollyann Samples, NP on 11/28/2021 Stage prefix: Initial diagnosis Histologic grading system: 3 grade system   11/06/2021 Initial Diagnosis   Malignant neoplasm of upper-inner quadrant of left breast in female, estrogen receptor positive (HCC)   11/15/2021 Breast MRI   IMPRESSION: 1.9 cm mass in the upper-inner quadrant of the left breast corresponding with the known invasive ductal carcinoma. Enlarged left axillary lymph node corresponding with known metastatic disease.   RECOMMENDATION: Treatment planning of the known left breast cancer and axillary metastasis is recommended.   Additional imaging evaluation of the mass in the liver is recommended with CT with liver mass protocol or MRI.   11/22/2021 Imaging   Bone scan IMPRESSION: No scintigraphic evidence of bony metastatic disease. Degenerative changes as above.   11/22/2021 Echocardiogram   Baseline echo LVEF 55-60%, normal GLS -21.7%   11/23/2021 Imaging   CT CAP IMPRESSION: 1. Bulky lymphadenopathy in the left axilla including a 5.0 x 2.8 cm lymph node. 2. 15 mm nodule identified in the upper inner quadrant of the left breast. 3. No suspicious pulmonary nodule or mass. No evidence for metastatic disease in the abdomen or pelvis. 4. 11 mm cyst in the dome of the left liver. Adjacent tiny hypoattenuating lesions in  the right liver are too small to characterize, but likely benign. Attention on follow-up recommended. 5.  Prominent stool volume raises the question of clinical constipation.   11/28/2021 - 03/20/2022 Chemotherapy   Patient is on Treatment Plan : BREAST ADJUVANT DOSE DENSE AC q14d / PACLitaxel q7d      Genetic Testing   [facility] CancerNext was Negative. Report date is 11/26/2021.   The CancerNext gene panel offered by [employer] includes sequencing, rearrangement analysis, and RNA analysis for the following 36 genes:   APC, ATM, AXIN2, BARD1, BMPR1A, BRCA1, BRCA2, BRIP1, CDH1, CDK4, CDKN2A, CHEK2, DICER1, HOXB13, EPCAM, GREM1, MLH1, MSH2, MSH3, MSH6, MUTYH, NBN, NF1, NTHL1, PALB2, PMS2, POLD1, POLE, PTEN, RAD51C, RAD51D, RECQL, SMAD4, SMARCA4, STK11, and TP53.    07/10/2022 Definitive Surgery   FINAL MICROSCOPIC DIAGNOSIS:   A. BREAST, LEFT, LUMPECTOMY:  - No residual carcinoma identified  - Treatment effect present (see comment)  - Ductal carcinoma in situ present, ypTis  - Margins negative for ductal carcinoma in situ  - See oncology table   B. BREAST, LEFT ADDITIONAL MEDIAL MARGIN, EXCISION:  - Negative for carcinoma   C. BREAST, LEFT ADDITIONAL LATERAL MARGIN, EXCISION:  - Negative for carcinoma   D. LYMPH NODE, LEFT AXILLARY, SENTINEL, EXCISION:  - Positive for carcinoma (1/1)   E. LYMPH NODE, LEFT AXILLARY, SENTINEL, EXCISION:  - Negative for carcinoma (0/1)   F. LYMPH NODE, LEFT, AXILLARY CONTENTS:  - Two lymph nodes identified  - Negative for carcinoma (0/2)    ADDENDUM:  Part D: Left axillary sentinel lymph node,   PROGNOSTIC INDICATOR RESULTS:  The tumor cells are NEGATIVE for Her2 (0).  Estrogen Receptor:       POSITIVE, 20%, WEAK STAINING  Progesterone Receptor:   POSITIVE, 2%, MODERATE STAINING    08/15/2022 - 10/02/2022 Radiation Therapy   Radiation Treatment Dates: 08/15/2022 through 10/02/2022 Site Technique Total Dose (Gy) Dose per Fx (Gy) Completed Fx Beam Energies  Breast, Left: Breast_L 3D 50.4/50.4 1.8 28/28 10X  Breast, Left: Breast_L_SCV_PAB 3D 50.4/50.4  1.8 28/28 6X, 15X  Breast, Left: Breast_L_Bst 3D 10/10 2 5/5 6X, 10X    Took adjuvant Xeloda concurrent with radiation then continued Xeloda until 01/2023   03/19/2023 Survivorship   SCP delivered by Santiago Glad, NP   02/2023 -  Anti-estrogen oral therapy   Began Exemestane     INTERVAL HISTORY:  Cassandra Brennan presents to the Survivorship Clinic today for our initial meeting to review her survivorship care plan detailing her treatment course for breast cancer, as well as monitoring long-term side effects of that treatment, education regarding health maintenance, screening, and overall wellness and health promotion.     Overall, Cassandra Brennan reports feeling well since completing Xeloda last month. Hands remain a little dark but hands and feet are much better. Can walk better. She started exemestane shortly after last visit, tolerating well. Some myalgias at night but overall improved with switching statin to Zetia and using stationary bike. No significant hot flashes.    REVIEW OF SYSTEMS:  Review of Systems - Oncology Breast: Denies any new nodularity, masses, tenderness, nipple changes, or nipple discharge.      ONCOLOGY TREATMENT TEAM:  1. Surgeon:  Dr. Luisa Hart at [facility] 2. Medical Oncologist: Dr. Mosetta Putt  3. Radiation Oncologist: Dr. Roselind Messier    PAST MEDICAL/SURGICAL HISTORY:  Past Medical History:  Diagnosis Date   Acute dyspnea    Acute metabolic encephalopathy    Anxiety    Arthritis  right knee    Cataract    Elevated troponin    Femoral DVT (deep venous thrombosis) (HCC)    GERD (gastroesophageal reflux disease)    History of radiation therapy    Left breast 08/15/22-10/02/22- Dr. Antony Blackbird   History of radiation therapy    Left breast- 08/15/22-10/02/22- Dr. Antony Blackbird   Hyperlipidemia    Hypertension    Lactic acidosis    Obstructive cardiovascular shock (HCC) 03/22/2022   Pneumonia    Positive colorectal cancer screening using Cologuard  test    Pulmonary embolism (HCC)    03/22/22   Past Surgical History:  Procedure Laterality Date   BREAST CYST EXCISION Right    pt stated in 1980   BREAST LUMPECTOMY WITH RADIOACTIVE SEED AND SENTINEL LYMPH NODE BIOPSY Left 07/10/2022   Procedure: LEFT BREAST LUMPECTOMY WITH RADIOACTIVE SEED AND LEFT  SENTINEL LYMPH NODE MAPPING;  Surgeon: Harriette Bouillon, MD;  Location: [facility];  Service: General;  Laterality: Left;   BREAST SURGERY     other GI testing     unsure but had to drink a chalky drink    PORTACATH PLACEMENT N/A 11/27/2021   Procedure: INSERTION PORT-A-CATH;  Surgeon: Harriette Bouillon, MD;  Location: [facility];  Service: General;  Laterality: N/A;   RADIOACTIVE SEED GUIDED AXILLARY SENTINEL LYMPH NODE Left 07/10/2022   Procedure: LEFT AXILLARY SEED LYMPH NODE BIOPSY;  Surgeon: Harriette Bouillon, MD;  Location: [facility];  Service: General;  Laterality: Left;   TONSILLECTOMY     TUBAL LIGATION       ALLERGIES:  Allergies  Allergen Reactions   Prednisone Hypertension   Rosuvastatin     Other reaction(s): Myalgias (Muscle Pain)   Guaifenesin Hives   Sulfa Antibiotics Hives   Other     Had a rxn to cortisone knee injection - had elevated BP and HR     CURRENT MEDICATIONS:  Outpatient Encounter Medications as of 03/19/2023  Medication Sig   acetaminophen (TYLENOL) 500 MG tablet Take 500 mg by mouth every 8 (eight) hours as needed for moderate pain.   apixaban (ELIQUIS) 5 MG TABS tablet TAKE 1 TABLET(5 MG) BY MOUTH TWICE DAILY   exemestane (AROMASIN) 25 MG tablet Take 1 tablet (25 mg total) by mouth daily after breakfast.   ezetimibe (ZETIA) 10 MG tablet Take 10 mg by mouth daily.   gabapentin (NEURONTIN) 100 MG capsule Take 1 capsule (100 mg total) by mouth at bedtime. OK to increase to 2 capsule at bed time if needed in a 1-2 weeks   lidocaine-prilocaine (EMLA) cream Apply to affected area once   LISINOPRIL PO Take 1 tablet by mouth daily.    metoprolol succinate (TOPROL-XL) 100 MG 24 hr tablet Take 100 mg by mouth daily. Take with or immediately following a meal.   moxifloxacin (VIGAMOX) 0.5 % ophthalmic solution Place 1 drop into the right eye 4 (four) times daily.   Multiple Vitamins-Minerals (MULTIVITAMIN ADULTS) TABS Take 1 tablet by mouth daily.   ondansetron (ZOFRAN) 8 MG tablet Take 1 tablet (8 mg total) by mouth 2 (two) times daily as needed. Start on the third day after chemotherapy.   prednisoLONE acetate (PRED FORTE) 1 % ophthalmic suspension Place 1 drop into the right eye 4 (four) times daily.   prochlorperazine (COMPAZINE) 10 MG tablet Take 1 tablet (10 mg total) by mouth every 6 (six) hours as needed (Nausea or vomiting).   triamcinolone (KENALOG) 0.025 % ointment Apply 1 Application topically  2 (two) times daily. Apply a dime size to both hands and feet 2 times daily for 8 days.   urea (CARMOL) 10 % cream Apply topically as needed.   pravastatin (PRAVACHOL) 20 MG tablet Take 1 tablet (20 mg total) by mouth daily. (Patient not taking: Reported on 03/19/2023)   No facility-administered encounter medications on file as of 03/19/2023.     ONCOLOGIC FAMILY HISTORY:  Family History  Problem Relation Age of Onset   Colon polyps Sister    Colon polyps Sister    Colon polyps Sister    Colon cancer Maternal Uncle        dx. 50s   Throat cancer Maternal Uncle    Colon cancer Cousin        dx. >50   Gastric cancer Cousin        dx. >50   Breast cancer Cousin        dx. 30s   Kidney cancer Nephew    Esophageal cancer Neg Hx    Rectal cancer Neg Hx    Stomach cancer Neg Hx      GENETIC COUNSELING/TESTING: Yes, negative   SOCIAL HISTORY:  AYLI SARDUY denies any current or history of tobacco, alcohol, or illicit drug use.     PHYSICAL EXAMINATION:  Vital Signs:   Vitals:   03/19/23 1221  BP: 139/81  Pulse: 78  Resp: 17  Temp: 98.4 F (36.9 C)  SpO2: 100%   Filed Weights   03/19/23 1221  Weight:  175 lb 12.8 oz (79.7 kg)   General: Well-nourished, well-appearing female in no acute distress. HEENT: Sclerae anicteric.   Respiratory: breathing non-labored.  Neuro: No focal deficits. Steady gait.  Psych: Mood and affect normal and appropriate for situation.  Extremities: No edema.    LABORATORY DATA:  None for this visit.  DIAGNOSTIC IMAGING:  None for this visit.      ASSESSMENT AND PLAN:  Cassandra Brennan is a pleasant 67 y.o. 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  She presents to the Survivorship Clinic for our initial meeting and routine follow-up post-completion of treatment for breast cancer.    1. Stage II left breast cancer:  Cassandra Brennan is continuing to recover from definitive treatment for breast cancer. She will follow-up with her medical oncologist, Dr. Mosetta Putt in 05/2023 with history and physical exam per surveillance protocol.  She will continue her anti-estrogen therapy with Exemestane. Thus far, she is tolerating well, with minimal side effects. She was instructed to make Dr. Mosetta Putt or myself aware if she begins to experience any worsening side effects of the medication and I could see her back in clinic to help manage those side effects, as needed. Today, a comprehensive survivorship care plan and treatment summary was reviewed with the patient today detailing her breast cancer diagnosis, treatment course, potential late/long-term effects of treatment, appropriate follow-up care with recommendations for the future, and patient education resources.  A copy of this summary, along with a letter will be sent to the patient's primary care provider via mail/fax/In Basket message after today's visit.    2. History of PE: She had acute PE and cardiogenic shock in  02/2022 while on neoadjuvant chemo. She has been on eliquis since then, tolerating well but expensive. She has completed chemo and is active, she can stop eliquis when the  current supply runs out in ~1 week.   3. Bone health:  Given Cassandra Brennan's age/history of breast cancer and her current treatment regimen including anti-estrogen therapy with exemestane, she is at risk for bone demineralization.  Her last DEXA scan was done at [facility], will request the report. In the meantime, she was encouraged to increase her consumption of foods rich in calcium, as well as increase her weight-bearing activities.  She was given education on specific activities to promote bone health.  4. Cancer screening:  Due to Cassandra Brennan's history and her age, she should receive screening for skin cancers, colon cancer, and gynecologic cancers.  The information and recommendations are listed on the patient's comprehensive care plan/treatment summary and were reviewed in detail with the patient.    5. Health maintenance and wellness promotion: Cassandra Brennan was encouraged to consume 5-7 servings of fruits and vegetables per day.  She was also encouraged to engage in moderate to vigorous exercise for 30 minutes per day most days of the week. She was instructed to limit her alcohol consumption and continue to abstain from tobacco use.  6. Support services/counseling: It is not uncommon for this period of the patient's cancer care trajectory to be one of many emotions and stressors.  We discussed an opportunity for her to participate in the next session of [facility] ("Finding Your New Normal") support group series designed for patients after they have completed treatment.   Cassandra Brennan was encouraged to take advantage of our many other support services programs, support groups, and/or counseling in coping with her new life as a cancer survivor after completing anti-cancer treatment.  She was offered support today through active listening and  expressive supportive counseling.  She was given information regarding our available services and encouraged to contact me with any questions or for help enrolling in any of our support group/programs.    Dispo:   -OK to stop eliquis when the current supply is complete ~1 week -Continue prenatal vitamin to support anemia recovery  -Port flush in 1 and 3 months, will likely remove after next f/up    -Return to cancer center in 3 months -Mammogram due in 12/2023 -Will request DEXA from [facility]  -Follow up with surgery as indicated   -She is welcome to return back to the Survivorship Clinic at any time; no additional follow-up needed at this time.  -Consider referral back to survivorship as a long-term survivor for continued surveillance  A total of (40) minutes of face-to-face time was spent with this patient with greater than 50% of that time in counseling and care-coordination.   Santiago Glad, NP Survivorship Program [facility] [phone]   Note: PRIMARY CARE PROVIDER Marva Panda, [place] [phone]

## 2023-04-15 ENCOUNTER — Other Ambulatory Visit: Payer: Self-pay

## 2023-04-15 ENCOUNTER — Inpatient Hospital Stay: Payer: Medicare HMO | Attending: Hematology

## 2023-04-15 DIAGNOSIS — Z170 Estrogen receptor positive status [ER+]: Secondary | ICD-10-CM | POA: Insufficient documentation

## 2023-04-15 DIAGNOSIS — C50212 Malignant neoplasm of upper-inner quadrant of left female breast: Secondary | ICD-10-CM | POA: Insufficient documentation

## 2023-04-15 DIAGNOSIS — Z79899 Other long term (current) drug therapy: Secondary | ICD-10-CM | POA: Diagnosis not present

## 2023-04-15 DIAGNOSIS — Z95828 Presence of other vascular implants and grafts: Secondary | ICD-10-CM

## 2023-04-15 LAB — CMP (CANCER CENTER ONLY)
ALT: 29 U/L (ref 0–44)
AST: 19 U/L (ref 15–41)
Albumin: 4.1 g/dL (ref 3.5–5.0)
Alkaline Phosphatase: 100 U/L (ref 38–126)
Anion gap: 6 (ref 5–15)
BUN: 26 mg/dL — ABNORMAL HIGH (ref 8–23)
CO2: 29 mmol/L (ref 22–32)
Calcium: 9.8 mg/dL (ref 8.9–10.3)
Chloride: 105 mmol/L (ref 98–111)
Creatinine: 0.9 mg/dL (ref 0.44–1.00)
GFR, Estimated: >60 mL/min (ref >60)
Glucose, Bld: 119 mg/dL — ABNORMAL HIGH (ref 70–99)
Potassium: 3.7 mmol/L (ref 3.5–5.1)
Sodium: 140 mmol/L (ref 135–145)
Total Bilirubin: 0.5 mg/dL (ref 0.3–1.2)
Total Protein: 6.9 g/dL (ref 6.5–8.1)

## 2023-04-15 LAB — CBC WITH DIFFERENTIAL (CANCER CENTER ONLY)
Abs Immature Granulocytes: 0.02 10*3/uL (ref 0.00–0.07)
Basophils Absolute: 0 10*3/uL (ref 0.0–0.1)
Basophils Relative: 1 %
Eosinophils Absolute: 0.3 10*3/uL (ref 0.0–0.5)
Eosinophils Relative: 6 %
HCT: 31.3 % — ABNORMAL LOW (ref 36.0–46.0)
Hemoglobin: 11.1 g/dL — ABNORMAL LOW (ref 12.0–15.0)
Immature Granulocytes: 0 %
Lymphocytes Relative: 18 %
Lymphs Abs: 1 10*3/uL (ref 0.7–4.0)
MCH: 32.5 pg (ref 26.0–34.0)
MCHC: 35.5 g/dL (ref 30.0–36.0)
MCV: 91.5 fL (ref 80.0–100.0)
Monocytes Absolute: 0.3 10*3/uL (ref 0.1–1.0)
Monocytes Relative: 6 %
Neutro Abs: 3.8 10*3/uL (ref 1.7–7.7)
Neutrophils Relative %: 69 %
Platelet Count: 226 10*3/uL (ref 150–400)
RBC: 3.42 MIL/uL — ABNORMAL LOW (ref 3.87–5.11)
RDW: 13.1 % (ref 11.5–15.5)
WBC Count: 5.5 10*3/uL (ref 4.0–10.5)
nRBC: 0 % (ref 0.0–0.2)

## 2023-04-15 LAB — SAMPLE TO BLOOD BANK

## 2023-04-15 MED ORDER — HEPARIN SOD (PORK) LOCK FLUSH 100 UNIT/ML IV SOLN
500.0000 [IU] | Freq: Once | INTRAVENOUS | Status: AC | PRN
  Administered 2023-04-15: 500 [IU]

## 2023-04-15 MED ORDER — SODIUM CHLORIDE 0.9% FLUSH
10.0000 mL | Freq: Once | INTRAVENOUS | Status: AC | PRN
  Administered 2023-04-15: 10 mL

## 2023-04-22 ENCOUNTER — Ambulatory Visit: Payer: Medicare HMO | Attending: Surgery

## 2023-04-22 VITALS — Wt 176.4 lb

## 2023-04-22 DIAGNOSIS — Z483 Aftercare following surgery for neoplasm: Secondary | ICD-10-CM | POA: Insufficient documentation

## 2023-04-22 NOTE — Therapy (Signed)
OUTPATIENT PHYSICAL THERAPY SOZO SCREENING NOTE   Patient Name: Cassandra Brennan MRN: [id number] DOB:[birth year], 67 y.o., female Today's Date: 04/22/2023  PCP: Marva Panda, NP REFERRING PROVIDER: Harriette Bouillon, MD   PT End of Session - 04/22/23 0941     Visit Number 11   # unchanged due to screen only   PT Start Time 0939    PT Stop Time 0943    PT Time Calculation (min) 4 min    Activity Tolerance Patient tolerated treatment well    Behavior During Therapy [facility] for tasks assessed/performed             Past Medical History:  Diagnosis Date   Acute dyspnea    Acute metabolic encephalopathy    Anxiety    Arthritis    right knee    Cataract    Elevated troponin    Femoral DVT (deep venous thrombosis) (HCC)    GERD (gastroesophageal reflux disease)    History of radiation therapy    Left breast 08/15/22-10/02/22- Dr. Antony Blackbird   History of radiation therapy    Left breast- 08/15/22-10/02/22- Dr. Antony Blackbird   Hyperlipidemia    Hypertension    Lactic acidosis    Obstructive cardiovascular shock (HCC) 03/22/2022   Pneumonia    Positive colorectal cancer screening using Cologuard test    Pulmonary embolism (HCC)    03/22/22   Past Surgical History:  Procedure Laterality Date   BREAST CYST EXCISION Right    pt stated in 1980   BREAST LUMPECTOMY WITH RADIOACTIVE SEED AND SENTINEL LYMPH NODE BIOPSY Left 07/10/2022   Procedure: LEFT BREAST LUMPECTOMY WITH RADIOACTIVE SEED AND LEFT  SENTINEL LYMPH NODE MAPPING;  Surgeon: Harriette Bouillon, MD;  Location: [facility];  Service: General;  Laterality: Left;   BREAST SURGERY     other GI testing     unsure but had to drink a chalky drink    PORTACATH PLACEMENT N/A 11/27/2021   Procedure: INSERTION PORT-A-CATH;  Surgeon: Harriette Bouillon, MD;  Location: [facility];  Service: General;  Laterality: N/A;   RADIOACTIVE SEED GUIDED AXILLARY SENTINEL LYMPH NODE Left 07/10/2022   Procedure: LEFT AXILLARY SEED LYMPH  NODE BIOPSY;  Surgeon: Harriette Bouillon, MD;  Location: [facility];  Service: General;  Laterality: Left;   TONSILLECTOMY     TUBAL LIGATION     Patient Active Problem List   Diagnosis Date Noted   Right leg DVT (HCC) 03/25/2022   Pneumonia 03/23/2022   AKI (acute kidney injury) (HCC) 03/23/2022   history of massive pulmonary embolism (HCC) 03/22/2022   GERD (gastroesophageal reflux disease)    Chronic anemia    Cardiogenic shock (HCC)    Anxiety 12/08/2021   Genetic testing 11/30/2021   Port-A-Cath in place 11/28/2021   Malignant neoplasm of upper-inner quadrant of left breast in female, estrogen receptor positive (HCC) 11/06/2021   Hypertension 03/15/2020    REFERRING DIAG: left breast cancer at risk for lymphedema  THERAPY DIAG: Aftercare following surgery for neoplasm  PERTINENT HISTORY: Patient was diagnosed on 10/19/2022 with left grade III invasive ductal carcinoma breast cancer. It measures 2 cm and is located in the upper inner quadrant. It is weakly ER positive, PR negative and HER2 negative with a Ki67 of 60%. L breast lumpectomy and SLNB on 07/10/22 (1/4), had blood clots in her lung   PRECAUTIONS: left UE Lymphedema risk, None  SUBJECTIVE: Pt returns for her 3 month L-Dex screen.   PAIN:  Are you  having pain? No  SOZO SCREENING: Patient was assessed today using the SOZO machine to determine the lymphedema index score. This was compared to her baseline score. It was determined that she is within the recommended range when compared to her baseline and no further action is needed at this time. She will continue SOZO screenings. These are done every 3 months for 2 years post operatively followed by every 6 months for 2 years, and then annually.   L-DEX FLOWSHEETS - 04/22/23 0900       L-DEX LYMPHEDEMA SCREENING   Measurement Type Unilateral    L-DEX MEASUREMENT EXTREMITY Upper Extremity    POSITION  Standing    DOMINANT SIDE Right    At Risk Side Left     BASELINE SCORE (UNILATERAL) 9.6    L-DEX SCORE (UNILATERAL) 4    VALUE CHANGE (UNILAT) -5.6               Hermenia Bers, PTA 04/22/2023, 9:42 AM

## 2023-05-06 ENCOUNTER — Other Ambulatory Visit: Payer: Self-pay | Admitting: Hematology

## 2023-05-08 ENCOUNTER — Encounter: Payer: Medicare HMO | Admitting: Nurse Practitioner

## 2023-05-08 ENCOUNTER — Other Ambulatory Visit: Payer: Medicare HMO

## 2023-06-20 ENCOUNTER — Encounter: Payer: Self-pay | Admitting: Hematology

## 2023-06-20 ENCOUNTER — Inpatient Hospital Stay: Payer: Medicare HMO

## 2023-06-20 ENCOUNTER — Inpatient Hospital Stay: Payer: Medicare HMO | Attending: Hematology | Admitting: Hematology

## 2023-06-20 VITALS — BP 130/72 | HR 89 | Temp 97.9°F | Resp 15 | Ht 65.0 in | Wt 180.6 lb

## 2023-06-20 DIAGNOSIS — T451X5A Adverse effect of antineoplastic and immunosuppressive drugs, initial encounter: Secondary | ICD-10-CM | POA: Diagnosis not present

## 2023-06-20 DIAGNOSIS — I10 Essential (primary) hypertension: Secondary | ICD-10-CM | POA: Insufficient documentation

## 2023-06-20 DIAGNOSIS — Z9089 Acquired absence of other organs: Secondary | ICD-10-CM | POA: Diagnosis not present

## 2023-06-20 DIAGNOSIS — Z79899 Other long term (current) drug therapy: Secondary | ICD-10-CM | POA: Insufficient documentation

## 2023-06-20 DIAGNOSIS — Z923 Personal history of irradiation: Secondary | ICD-10-CM | POA: Insufficient documentation

## 2023-06-20 DIAGNOSIS — C50212 Malignant neoplasm of upper-inner quadrant of left female breast: Secondary | ICD-10-CM

## 2023-06-20 DIAGNOSIS — Z95828 Presence of other vascular implants and grafts: Secondary | ICD-10-CM

## 2023-06-20 DIAGNOSIS — I2699 Other pulmonary embolism without acute cor pulmonale: Secondary | ICD-10-CM | POA: Diagnosis not present

## 2023-06-20 DIAGNOSIS — Z86718 Personal history of other venous thrombosis and embolism: Secondary | ICD-10-CM | POA: Diagnosis not present

## 2023-06-20 DIAGNOSIS — Z79811 Long term (current) use of aromatase inhibitors: Secondary | ICD-10-CM | POA: Diagnosis not present

## 2023-06-20 DIAGNOSIS — D6481 Anemia due to antineoplastic chemotherapy: Secondary | ICD-10-CM | POA: Diagnosis not present

## 2023-06-20 DIAGNOSIS — D649 Anemia, unspecified: Secondary | ICD-10-CM

## 2023-06-20 DIAGNOSIS — Z888 Allergy status to other drugs, medicaments and biological substances status: Secondary | ICD-10-CM | POA: Diagnosis not present

## 2023-06-20 DIAGNOSIS — M2550 Pain in unspecified joint: Secondary | ICD-10-CM | POA: Insufficient documentation

## 2023-06-20 DIAGNOSIS — Z170 Estrogen receptor positive status [ER+]: Secondary | ICD-10-CM | POA: Insufficient documentation

## 2023-06-20 DIAGNOSIS — Z882 Allergy status to sulfonamides status: Secondary | ICD-10-CM | POA: Insufficient documentation

## 2023-06-20 DIAGNOSIS — Z7901 Long term (current) use of anticoagulants: Secondary | ICD-10-CM | POA: Insufficient documentation

## 2023-06-20 DIAGNOSIS — Z86711 Personal history of pulmonary embolism: Secondary | ICD-10-CM | POA: Insufficient documentation

## 2023-06-20 LAB — CMP (CANCER CENTER ONLY)
ALT: 20 U/L (ref 0–44)
AST: 16 U/L (ref 15–41)
Albumin: 4.3 g/dL (ref 3.5–5.0)
Alkaline Phosphatase: 99 U/L (ref 38–126)
Anion gap: 7 (ref 5–15)
BUN: 23 mg/dL (ref 8–23)
CO2: 29 mmol/L (ref 22–32)
Calcium: 9.6 mg/dL (ref 8.9–10.3)
Chloride: 103 mmol/L (ref 98–111)
Creatinine: 0.91 mg/dL (ref 0.44–1.00)
GFR, Estimated: >60 mL/min (ref >60)
Glucose, Bld: 96 mg/dL (ref 70–99)
Potassium: 3.7 mmol/L (ref 3.5–5.1)
Sodium: 139 mmol/L (ref 135–145)
Total Bilirubin: 0.5 mg/dL (ref 0.3–1.2)
Total Protein: 7.3 g/dL (ref 6.5–8.1)

## 2023-06-20 LAB — CBC WITH DIFFERENTIAL (CANCER CENTER ONLY)
Abs Immature Granulocytes: 0.01 10*3/uL (ref 0.00–0.07)
Basophils Absolute: 0 10*3/uL (ref 0.0–0.1)
Basophils Relative: 0 %
Eosinophils Absolute: 0.4 10*3/uL (ref 0.0–0.5)
Eosinophils Relative: 7 %
HCT: 30.1 % — ABNORMAL LOW (ref 36.0–46.0)
Hemoglobin: 10.5 g/dL — ABNORMAL LOW (ref 12.0–15.0)
Immature Granulocytes: 0 %
Lymphocytes Relative: 26 %
Lymphs Abs: 1.4 10*3/uL (ref 0.7–4.0)
MCH: 30.9 pg (ref 26.0–34.0)
MCHC: 34.9 g/dL (ref 30.0–36.0)
MCV: 88.5 fL (ref 80.0–100.0)
Monocytes Absolute: 0.3 10*3/uL (ref 0.1–1.0)
Monocytes Relative: 6 %
Neutro Abs: 3.3 10*3/uL (ref 1.7–7.7)
Neutrophils Relative %: 61 %
Platelet Count: 200 10*3/uL (ref 150–400)
RBC: 3.4 MIL/uL — ABNORMAL LOW (ref 3.87–5.11)
RDW: 14.5 % (ref 11.5–15.5)
WBC Count: 5.5 10*3/uL (ref 4.0–10.5)
nRBC: 0 % (ref 0.0–0.2)

## 2023-06-20 LAB — SAMPLE TO BLOOD BANK

## 2023-06-20 MED ORDER — HEPARIN SOD (PORK) LOCK FLUSH 100 UNIT/ML IV SOLN
500.0000 [IU] | Freq: Once | INTRAVENOUS | Status: AC
  Administered 2023-06-20: 500 [IU]

## 2023-06-20 MED ORDER — SODIUM CHLORIDE 0.9% FLUSH
10.0000 mL | Freq: Once | INTRAVENOUS | Status: AC
  Administered 2023-06-20: 10 mL

## 2023-06-20 MED ORDER — EXEMESTANE 25 MG PO TABS: 25.0000 mg | ORAL_TABLET | Freq: Every day | ORAL | 0 refills | Status: DC

## 2023-06-20 NOTE — Assessment & Plan Note (Signed)
-  secondary to chemo, she received blood transfusion before  -continue monitoring

## 2023-06-20 NOTE — Progress Notes (Signed)
[facility]   [phone] [phone]   Clinic Follow up Note   Patient Care Team: Elie Confer, NP as PCP - General Harriette Bouillon, MD as Consulting Physician (General Surgery) Malachy Mood, MD as Consulting Physician (Hematology) Antony Blackbird, MD as Consulting Physician (Radiation Oncology) Pershing Proud, RN as Oncology Nurse Navigator Donnelly Angelica, RN as Oncology Nurse Navigator Pollyann Samples, NP as Nurse Practitioner (Nurse Practitioner)  Date of Service:  06/20/2023  CHIEF COMPLAINT: f/u of ft breast cancer      CURRENT THERAPY:  Exemestane starting 02/2023  ASSESSMENT:  Cassandra Brennan is a 67 y.o. female with   Malignant neoplasm of upper-inner quadrant of left breast in female, estrogen receptor positive (HCC) IDC, Stage IIB, c(T1c, N1), yp(T0, N1) ER 30% weak+, PR-/HER2-, Grade 3  -diagnosed in 10/2021 -s/p neoadjuvant chemo ACx4, followed by weekly taxol 01/22/22 - 03/20/22, discontinued due to hospitalization for massive PE and cardiogenic shock.  -she took anastrozole 04/17/22 - 06/22/22 due to delay in surgery from hospitalization, discontinued due to arthralgias. -s/p left lumpectomy on 07/10/22 under Dr. Luisa Hart, path showed complete pathologic response in breast but 1/4 positive lymph nodes. Repeat prognostic panel on the positive node showed ER 20% weak, PR 2% moderate, and Her2 negative. -she completed concurrent chemoRT with Xeloda on 10/02/2022 -She completed adjuvant Xeloda for 6 months in April 2024. -She has started to exemestane in May 2024, tolerating well overall.  Will continue for 5 years.  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 -willcontinue Eliquis until she completes chemo     PLAN: -  reviewed meds for refill - refill Exemestane  - ok to contact Dr. Luisa Hart to set app to remove port - reviewed labs - anemia has improved - f/u in 6 months - order mammogram for March 2025   SUMMARY OF ONCOLOGIC HISTORY: Oncology History Overview Note   Cancer Staging  Malignant neoplasm of upper-inner quadrant of left breast in female, estrogen receptor positive (HCC) Staging form: Breast, AJCC 8th Edition - Clinical stage from 10/27/2021: Stage IIB (cT1c, cN1, cM0, G3, ER+, PR-, HER2-) - Signed by Pollyann Samples, NP on 11/28/2021 - Pathologic stage from 07/10/2022: No Stage Recommended (ypT0, pN1, cM0, G3, ER+, PR-, HER2-) - Unsigned     Malignant neoplasm of upper-inner quadrant of left breast in female, estrogen receptor positive (HCC)  10/19/2021 Mammogram   CLINICAL DATA:  Patient presents for palpable left axillary abnormality.   EXAM: DIGITAL DIAGNOSTIC BILATERAL MAMMOGRAM WITH TOMOSYNTHESIS AND CAD; ULTRASOUND LEFT BREAST LIMITED  IMPRESSION: Suspicious left breast mass 10 o'clock position.   Adjacent suspicious satellite nodule left breast 12 o'clock position.   Palpable mass left axilla corresponds with a large lymph node.   10/27/2021 Initial Biopsy   Diagnosis 1. Breast, left, needle core biopsy, 12 o'clock, ribbon - FIBROADENOMA - NO MALIGNANCY IDENTIFIED 2. Breast, left, needle core biopsy, 10 o'clock, coil - INVASIVE DUCTAL CARCINOMA WITH NECROSIS - DUCTAL CARCINOMA IN SITU - SEE COMMENT 3. Lymph node, needle/core biopsy, left axilla, tribell - INVASIVE DUCTAL CARCINOMA WITH NECROSIS - NO NODAL TISSUE IDENTIFIED - SEE COMMENT Microscopic Comment 2. and 2. Based on the biopsy, the carcinoma appears Nottingham grade 3 of 3 and measures 0.8 cm in greatest linear extent.  3. PROGNOSTIC  INDICATORS Results: The tumor cells are NEGATIVE for Her2 (1+). Estrogen Receptor: 30%, POSITIVE, WEAK STAINING INTENSITY Progesterone Receptor: 0%,  NEGATIVE Proliferation Marker Ki67: 60%   10/27/2021 Cancer Staging   Staging form: Breast, AJCC 8th Edition - Clinical stage from 10/27/2021: Stage IIB (cT1c, cN1, cM0, G3, ER+, PR-, HER2-) - Signed by Pollyann Samples, NP on 11/28/2021 Stage prefix: Initial diagnosis Histologic grading system: 3 grade system   11/06/2021 Initial Diagnosis   Malignant neoplasm of upper-inner quadrant of left breast in female, estrogen receptor positive (HCC)   11/15/2021 Breast MRI   IMPRESSION: 1.9 cm mass in the upper-inner quadrant of the left breast corresponding with the known invasive ductal carcinoma. Enlarged left axillary lymph node corresponding with known metastatic disease.   RECOMMENDATION: Treatment planning of the known left breast cancer and axillary metastasis is recommended.   Additional imaging evaluation of the mass in the liver is recommended with CT with liver mass protocol or MRI.   11/22/2021 Imaging   Bone scan IMPRESSION: No scintigraphic evidence of bony metastatic disease. Degenerative changes as above.   11/22/2021 Echocardiogram   Baseline echo LVEF 55-60%, normal GLS -21.7%   11/23/2021 Imaging   CT CAP IMPRESSION: 1. Bulky lymphadenopathy in the left axilla including a 5.0 x 2.8 cm lymph node. 2. 15 mm nodule identified in the upper inner quadrant of the left breast. 3. No suspicious pulmonary nodule or mass. No evidence for metastatic disease in the abdomen or pelvis. 4. 11 mm cyst in the dome of the left liver. Adjacent tiny hypoattenuating lesions in the right liver are too small to characterize, but likely benign. Attention on follow-up recommended. 5. Prominent stool volume raises the question of clinical constipation.   11/28/2021 - 03/20/2022 Chemotherapy   Patient is on Treatment Plan : BREAST ADJUVANT DOSE DENSE AC q14d / PACLitaxel q7d      Genetic Testing   [facility] CancerNext was Negative. Report date is 11/26/2021.   The CancerNext gene panel offered by [employer]  includes sequencing, rearrangement analysis, and RNA analysis for the following 36 genes:   APC, ATM, AXIN2, BARD1, BMPR1A, BRCA1, BRCA2, BRIP1, CDH1, CDK4, CDKN2A, CHEK2, DICER1, HOXB13, EPCAM, GREM1, MLH1, MSH2, MSH3, MSH6, MUTYH, NBN, NF1, NTHL1, PALB2, PMS2, POLD1, POLE, PTEN, RAD51C, RAD51D, RECQL, SMAD4, SMARCA4, STK11, and TP53.    07/10/2022 Definitive Surgery   FINAL MICROSCOPIC DIAGNOSIS:   A. BREAST, LEFT, LUMPECTOMY:  - No residual carcinoma identified  - Treatment effect present (see comment)  - Ductal carcinoma in situ present, ypTis  - Margins negative for ductal carcinoma in situ  - See oncology table   B. BREAST, LEFT ADDITIONAL MEDIAL MARGIN, EXCISION:  - Negative for carcinoma   C. BREAST, LEFT ADDITIONAL LATERAL MARGIN, EXCISION:  - Negative for carcinoma   D. LYMPH NODE, LEFT AXILLARY, SENTINEL, EXCISION:  - Positive for carcinoma (1/1)   E. LYMPH NODE, LEFT AXILLARY, SENTINEL, EXCISION:  - Negative for carcinoma (0/1)   F. LYMPH NODE, LEFT, AXILLARY CONTENTS:  - Two lymph nodes identified  - Negative for carcinoma (0/2)    ADDENDUM:  Part D: Left axillary sentinel lymph node,   PROGNOSTIC INDICATOR RESULTS:  The tumor cells are NEGATIVE for Her2 (0).  Estrogen Receptor:       POSITIVE, 20%, WEAK STAINING  Progesterone Receptor:   POSITIVE, 2%, MODERATE STAINING    08/15/2022 - 10/02/2022 Radiation Therapy   Radiation Treatment Dates: 08/15/2022 through 10/02/2022 Site Technique Total Dose (Gy) Dose per Fx (Gy)  Completed Fx Beam Energies  Breast, Left: Breast_L 3D 50.4/50.4 1.8 28/28 10X  Breast, Left: Breast_L_SCV_PAB 3D 50.4/50.4 1.8 28/28 6X, 15X  Breast, Left: Breast_L_Bst 3D 10/10 2 5/5 6X, 10X    Took adjuvant Xeloda concurrent with radiation then continued Xeloda until 01/2023   03/19/2023 Survivorship   SCP delivered by Santiago Glad, NP   02/2023 -  Anti-estrogen oral therapy   Began Exemestane      INTERVAL HISTORY:  Cassandra Brennan is  here for a follow up of t breast cancer . She was last by Santiago Glad NP on 03/19/2023. She presents ot clinic today alone. She stated she is doing well overall. She has had no issues with the Exemestane.     All other systems were reviewed with the patient and are negative.  MEDICAL HISTORY:  Past Medical History:  Diagnosis Date   Acute dyspnea    Acute metabolic encephalopathy    Anxiety    Arthritis    right knee    Cataract    Elevated troponin    Femoral DVT (deep venous thrombosis) (HCC)    GERD (gastroesophageal reflux disease)    History of radiation therapy    Left breast 08/15/22-10/02/22- Dr. Antony Blackbird   History of radiation therapy    Left breast- 08/15/22-10/02/22- Dr. Antony Blackbird   Hyperlipidemia    Hypertension    Lactic acidosis    Obstructive cardiovascular shock (HCC) 03/22/2022   Pneumonia    Positive colorectal cancer screening using Cologuard test    Pulmonary embolism (HCC)    03/22/22    SURGICAL HISTORY: Past Surgical History:  Procedure Laterality Date   BREAST CYST EXCISION Right    pt stated in 1980   BREAST LUMPECTOMY WITH RADIOACTIVE SEED AND SENTINEL LYMPH NODE BIOPSY Left 07/10/2022   Procedure: LEFT BREAST LUMPECTOMY WITH RADIOACTIVE SEED AND LEFT  SENTINEL LYMPH NODE MAPPING;  Surgeon: Harriette Bouillon, MD;  Location: [facility];  Service: General;  Laterality: Left;   BREAST SURGERY     other GI testing     unsure but had to drink a chalky drink    PORTACATH PLACEMENT N/A 11/27/2021   Procedure: INSERTION PORT-A-CATH;  Surgeon: Harriette Bouillon, MD;  Location: [facility];  Service: General;  Laterality: N/A;   RADIOACTIVE SEED GUIDED AXILLARY SENTINEL LYMPH NODE Left 07/10/2022   Procedure: LEFT AXILLARY SEED LYMPH NODE BIOPSY;  Surgeon: Harriette Bouillon, MD;  Location: [facility];  Service: General;  Laterality: Left;   TONSILLECTOMY     TUBAL LIGATION      I have reviewed the social history and family  history with the patient and they are unchanged from previous note.  ALLERGIES:  is allergic to prednisone, rosuvastatin, guaifenesin, sulfa antibiotics, and other.  MEDICATIONS:  Current Outpatient Medications  Medication Sig Dispense Refill   acetaminophen (TYLENOL) 500 MG tablet Take 500 mg by mouth every 8 (eight) hours as needed for moderate pain.     exemestane (AROMASIN) 25 MG tablet Take 1 tablet (25 mg total) by mouth daily. 90 tablet 0   ezetimibe (ZETIA) 10 MG tablet Take 10 mg by mouth daily.     lidocaine-prilocaine (EMLA) cream Apply to affected area once 30 g 3   LISINOPRIL PO Take 1 tablet by mouth daily.     metoprolol succinate (TOPROL-XL) 100 MG 24 hr tablet Take 100 mg by mouth daily. Take with or immediately following a meal.     moxifloxacin (VIGAMOX) 0.5 %  ophthalmic solution Place 1 drop into the right eye 4 (four) times daily.     Multiple Vitamins-Minerals (MULTIVITAMIN ADULTS) TABS Take 1 tablet by mouth daily.     pravastatin (PRAVACHOL) 20 MG tablet Take 1 tablet (20 mg total) by mouth daily. (Patient not taking: Reported on 03/19/2023) 30 tablet 0   prednisoLONE acetate (PRED FORTE) 1 % ophthalmic suspension Place 1 drop into the right eye 4 (four) times daily.     triamcinolone (KENALOG) 0.025 % ointment Apply 1 Application topically 2 (two) times daily. Apply a dime size to both hands and feet 2 times daily for 8 days. 15 g 0   urea (CARMOL) 10 % cream Apply topically as needed. 71 g 0   No current facility-administered medications for this visit.    PHYSICAL EXAMINATION: ECOG PERFORMANCE STATUS: 0 - Asymptomatic  Vitals:   06/20/23 1428  BP: 130/72  Pulse: 89  Resp: 15  Temp: 97.9 F (36.6 C)  SpO2: 100%   Wt Readings from Last 3 Encounters:  06/20/23 180 lb 9.6 oz (81.9 kg)  04/22/23 176 lb 6 oz (80 kg)  03/19/23 175 lb 12.8 oz (79.7 kg)     GENERAL:alert, no distress and comfortable SKIN: skin color, texture, turgor are normal, no rashes or  significant lesions EYES: normal, Conjunctiva are pink and non-injected, sclera clear NECK: supple, thyroid normal size, non-tender, without nodularity LYMPH:  no palpable lymphadenopathy in the cervical, axillary  LUNGS: clear to auscultation and percussion with normal breathing effort HEART: regular rate & rhythm and no murmurs and no lower extremity edema ABDOMEN:abdomen soft, non-tender and normal bowel sounds Musculoskeletal:no cyanosis of digits and no clubbing  NEURO: alert & oriented x 3 with fluent speech, no focal motor/sensory deficits BREAST: no palpable mass on left or right side, exam nomal LABORATORY DATA:  I have reviewed the data as listed    Latest Ref Rng & Units 06/20/2023    1:57 PM 04/15/2023    1:30 PM 02/04/2023    1:39 PM  CBC  WBC 4.0 - 10.5 K/uL 5.5  5.5  5.1   Hemoglobin 12.0 - 15.0 g/dL 81.1  91.4  9.6   Hematocrit 36.0 - 46.0 % 30.1  31.3  27.3   Platelets 150 - 400 K/uL 200  226  200         Latest Ref Rng & Units 06/20/2023    1:57 PM 04/15/2023    1:30 PM 02/04/2023    1:39 PM  CMP  Glucose 70 - 99 mg/dL 96  782  956   BUN 8 - 23 mg/dL 23  26  16    Creatinine 0.44 - 1.00 mg/dL 2.13  0.86  5.78   Sodium 135 - 145 mmol/L 139  140  140   Potassium 3.5 - 5.1 mmol/L 3.7  3.7  3.5   Chloride 98 - 111 mmol/L 103  105  108   CO2 22 - 32 mmol/L 29  29  26    Calcium 8.9 - 10.3 mg/dL 9.6  9.8  9.3   Total Protein 6.5 - 8.1 g/dL 7.3  6.9  6.8   Total Bilirubin 0.3 - 1.2 mg/dL 0.5  0.5  0.7   Alkaline Phos 38 - 126 U/L 99  100  82   AST 15 - 41 U/L 16  19  23    ALT 0 - 44 U/L 20  29  23        RADIOGRAPHIC STUDIES: I have personally reviewed  the radiological images as listed and agreed with the findings in the report. No results found.    Orders Placed This Encounter  Procedures   MM 3D DIAGNOSTIC MAMMOGRAM BILATERAL BREAST    Standing Status:   Future    Standing Expiration Date:   06/19/2024    Order Specific Question:   Reason for Exam (SYMPTOM   OR DIAGNOSIS REQUIRED)    Answer:   screening    Order Specific Question:   Preferred imaging location?    Answer:   [facility]   All questions were answered. The patient knows to call the clinic with any problems, questions or concerns. No barriers to learning was detected. The total time spent in the appointment was 25 minutes.     Malachy Mood, MD 06/20/2023   I, Sharlette Dense, CMA, am acting as scribe for Malachy Mood, MD.   I have reviewed the above documentation for accuracy and completeness, and I agree with the above.

## 2023-06-20 NOTE — Assessment & Plan Note (Addendum)
IDC, Stage IIB, c(T1c, N1), yp(T0, N1) ER 30% weak+, PR-/HER2-, Grade 3  -diagnosed in 10/2021 -s/p neoadjuvant chemo ACx4, followed by weekly taxol 01/22/22 - 03/20/22, discontinued due to hospitalization for massive PE and cardiogenic shock.  -she took anastrozole 04/17/22 - 06/22/22 due to delay in surgery from hospitalization, discontinued due to arthralgias. -s/p left lumpectomy on 07/10/22 under Dr. Luisa Hart, path showed complete pathologic response in breast but 1/4 positive lymph nodes. Repeat prognostic panel on the positive node showed ER 20% weak, PR 2% moderate, and Her2 negative. -she completed concurrent chemoRT with Xeloda on 10/02/2022 -She completed adjuvant Xeloda for 6 months in April 2024. -She has started to exemestane in May 2024, tolerating well overall.  Will continue for 5 years.

## 2023-06-20 NOTE — Assessment & Plan Note (Signed)
diagnosed in 03/2022 when she was on neoadjuvant chemotherapy -Status post thrombolysis, on Eliquis, tolerating well. -willcontinue Eliquis until she completes chemo

## 2023-06-21 ENCOUNTER — Telehealth: Payer: Self-pay | Admitting: Nurse Practitioner

## 2023-08-07 ENCOUNTER — Ambulatory Visit: Payer: Medicare HMO | Admitting: Hematology

## 2023-08-07 ENCOUNTER — Other Ambulatory Visit: Payer: Medicare HMO

## 2023-09-02 ENCOUNTER — Ambulatory Visit: Payer: Medicare HMO | Attending: Surgery

## 2023-09-02 VITALS — Wt 180.0 lb

## 2023-09-02 DIAGNOSIS — Z483 Aftercare following surgery for neoplasm: Secondary | ICD-10-CM | POA: Insufficient documentation

## 2023-09-02 NOTE — Therapy (Signed)
OUTPATIENT PHYSICAL THERAPY SOZO SCREENING NOTE   Patient Name: Cassandra Brennan MRN: [id number] DOB:04-Aug-1956, 67 y.o., female Today's Date: 09/02/2023  PCP: Elie Confer, NP REFERRING PROVIDER: Harriette Bouillon, MD   PT End of Session - 09/02/23 1020     Visit Number 11   # unchanged due to screen only   PT Start Time 1018    PT Stop Time 1022    PT Time Calculation (min) 4 min    Activity Tolerance Patient tolerated treatment well    Behavior During Therapy WFL for tasks assessed/performed             Past Medical History:  Diagnosis Date   Acute dyspnea    Acute metabolic encephalopathy    Anxiety    Arthritis    right knee    Cataract    Elevated troponin    Femoral DVT (deep venous thrombosis) (HCC)    GERD (gastroesophageal reflux disease)    History of radiation therapy    Left breast 08/15/22-10/02/22- Dr. Antony Blackbird   History of radiation therapy    Left breast- 08/15/22-10/02/22- Dr. Antony Blackbird   Hyperlipidemia    Hypertension    Lactic acidosis    Obstructive cardiovascular shock (HCC) 03/22/2022   Pneumonia    Positive colorectal cancer screening using Cologuard test    Pulmonary embolism (HCC)    03/22/22   Past Surgical History:  Procedure Laterality Date   BREAST CYST EXCISION Right    pt stated in 1980   BREAST LUMPECTOMY WITH RADIOACTIVE SEED AND SENTINEL LYMPH NODE BIOPSY Left 07/10/2022   Procedure: LEFT BREAST LUMPECTOMY WITH RADIOACTIVE SEED AND LEFT  SENTINEL LYMPH NODE MAPPING;  Surgeon: Harriette Bouillon, MD;  Location: [facility];  Service: General;  Laterality: Left;   BREAST SURGERY     other GI testing     unsure but had to drink a chalky drink    PORTACATH PLACEMENT N/A 11/27/2021   Procedure: INSERTION PORT-A-CATH;  Surgeon: Harriette Bouillon, MD;  Location: [facility];  Service: General;  Laterality: N/A;   RADIOACTIVE SEED GUIDED AXILLARY SENTINEL LYMPH NODE Left 07/10/2022   Procedure: LEFT AXILLARY SEED LYMPH  NODE BIOPSY;  Surgeon: Harriette Bouillon, MD;  Location: [facility];  Service: General;  Laterality: Left;   TONSILLECTOMY     TUBAL LIGATION     Patient Active Problem List   Diagnosis Date Noted   Right leg DVT (HCC) 03/25/2022   Pneumonia 03/23/2022   AKI (acute kidney injury) (HCC) 03/23/2022   history of massive pulmonary embolism (HCC) 03/22/2022   GERD (gastroesophageal reflux disease)    Chronic anemia    Cardiogenic shock (HCC)    Anxiety 12/08/2021   Genetic testing 11/30/2021   Port-A-Cath in place 11/28/2021   Malignant neoplasm of upper-inner quadrant of left breast in female, estrogen receptor positive (HCC) 11/06/2021   Hypertension 03/15/2020    REFERRING DIAG: left breast cancer at risk for lymphedema  THERAPY DIAG: Aftercare following surgery for neoplasm  PERTINENT HISTORY: Patient was diagnosed on 10/19/2022 with left grade III invasive ductal carcinoma breast cancer. It measures 2 cm and is located in the upper inner quadrant. It is weakly ER positive, PR negative and HER2 negative with a Ki67 of 60%. L breast lumpectomy and SLNB on 07/10/22 (1/4), had blood clots in her lung   PRECAUTIONS: left UE Lymphedema risk, None  SUBJECTIVE: Pt returns for her 3 month L-Dex screen.   PAIN:  Are  you having pain? No  SOZO SCREENING: Patient was assessed today using the SOZO machine to determine the lymphedema index score. This was compared to her baseline score. It was determined that she is within the recommended range when compared to her baseline and no further action is needed at this time. She will continue SOZO screenings. These are done every 3 months for 2 years post operatively followed by every 6 months for 2 years, and then annually.   L-DEX FLOWSHEETS - 09/02/23 1000       L-DEX LYMPHEDEMA SCREENING   Measurement Type Unilateral    L-DEX MEASUREMENT EXTREMITY Upper Extremity    POSITION  Standing    DOMINANT SIDE Right    At Risk Side Left     BASELINE SCORE (UNILATERAL) 9.6    L-DEX SCORE (UNILATERAL) 6.6    VALUE CHANGE (UNILAT) -3               Hermenia Bers, PTA 09/02/2023, 10:21 AM

## 2023-11-07 ENCOUNTER — Encounter: Payer: Self-pay | Admitting: Hematology

## 2023-11-25 ENCOUNTER — Encounter: Payer: Self-pay | Admitting: Hematology

## 2023-11-27 ENCOUNTER — Telehealth: Payer: Self-pay | Admitting: Nurse Practitioner

## 2023-11-27 NOTE — Telephone Encounter (Signed)
 Rescheduled appointments per provider request. Patient is aware of the changes made and will be mailed an appointment reminder.

## 2023-12-02 ENCOUNTER — Ambulatory Visit: Payer: Medicare PPO | Attending: Surgery

## 2023-12-02 VITALS — Wt 184.2 lb

## 2023-12-02 DIAGNOSIS — Z483 Aftercare following surgery for neoplasm: Secondary | ICD-10-CM | POA: Insufficient documentation

## 2023-12-02 NOTE — Therapy (Signed)
 OUTPATIENT PHYSICAL THERAPY SOZO SCREENING NOTE   Patient Name: Cassandra Brennan MRN: [id number] DOB:[birth year], 68 y.o., female Today's Date: 12/02/2023  PCP: Verlene Glimpse, NP REFERRING PROVIDER: Sim Dryer, MD   PT End of Session - 12/02/23 1032     Visit Number 11   # unchanged due to screen only   PT Start Time 1030    PT Stop Time 1034    PT Time Calculation (min) 4 min    Activity Tolerance Patient tolerated treatment well    Behavior During Therapy WFL for tasks assessed/performed             Past Medical History:  Diagnosis Date   Acute dyspnea    Acute metabolic encephalopathy    Anxiety    Arthritis    right knee    Cataract    Elevated troponin    Femoral DVT (deep venous thrombosis) (HCC)    GERD (gastroesophageal reflux disease)    History of radiation therapy    Left breast 08/15/22-10/02/22- Dr. Retta Caster   History of radiation therapy    Left breast- 08/15/22-10/02/22- Dr. Retta Caster   Hyperlipidemia    Hypertension    Lactic acidosis    Obstructive cardiovascular shock (HCC) 03/22/2022   Pneumonia    Positive colorectal cancer screening using Cologuard test    Pulmonary embolism (HCC)    03/22/22   Past Surgical History:  Procedure Laterality Date   BREAST CYST EXCISION Right    pt stated in 1980   BREAST LUMPECTOMY WITH RADIOACTIVE SEED AND SENTINEL LYMPH NODE BIOPSY Left 07/10/2022   Procedure: LEFT BREAST LUMPECTOMY WITH RADIOACTIVE SEED AND LEFT  SENTINEL LYMPH NODE MAPPING;  Surgeon: Sim Dryer, MD;  Location: [facility];  Service: General;  Laterality: Left;   BREAST SURGERY     other GI testing     unsure but had to drink a chalky drink    PORTACATH PLACEMENT N/A 11/27/2021   Procedure: INSERTION PORT-A-CATH;  Surgeon: Sim Dryer, MD;  Location: [facility];  Service: General;  Laterality: N/A;   RADIOACTIVE SEED GUIDED AXILLARY SENTINEL LYMPH NODE Left 07/10/2022   Procedure: LEFT AXILLARY SEED LYMPH  NODE BIOPSY;  Surgeon: Sim Dryer, MD;  [facility];  Service: General;  Laterality: Left;   TONSILLECTOMY     TUBAL LIGATION     Patient Active Problem List   Diagnosis Date Noted   Right leg DVT (HCC) 03/25/2022   Pneumonia 03/23/2022   AKI (acute kidney injury) (HCC) 03/23/2022   history of massive pulmonary embolism (HCC) 03/22/2022   GERD (gastroesophageal reflux disease)    Chronic anemia    Cardiogenic shock (HCC)    Anxiety 12/08/2021   Genetic testing 11/30/2021   Port-A-Cath in place 11/28/2021   Malignant neoplasm of upper-inner quadrant of left breast in female, estrogen receptor positive (HCC) 11/06/2021   Hypertension 03/15/2020    REFERRING DIAG: left breast cancer at risk for lymphedema  THERAPY DIAG: Aftercare following surgery for neoplasm  PERTINENT HISTORY: Patient was diagnosed on 10/19/2022 with left grade III invasive ductal carcinoma breast cancer. It measures 2 cm and is located in the upper inner quadrant. It is weakly ER positive, PR negative and HER2 negative with a Ki67 of 60%. L breast lumpectomy and SLNB on 07/10/22 (1/4), had blood clots in her lung   PRECAUTIONS: left UE Lymphedema risk, None  SUBJECTIVE: Pt returns for her 3 month L-Dex screen.   PAIN:  Are  you having pain? No  SOZO SCREENING: Patient was assessed today using the SOZO machine to determine the lymphedema index score. This was compared to her baseline score. It was determined that she is within the recommended range when compared to her baseline and no further action is needed at this time. She will continue SOZO screenings. These are done every 3 months for 2 years post operatively followed by every 6 months for 2 years, and then annually.   L-DEX FLOWSHEETS - 12/02/23 1000       L-DEX LYMPHEDEMA SCREENING   Measurement Type Unilateral    L-DEX MEASUREMENT EXTREMITY Upper Extremity    POSITION  Standing    DOMINANT SIDE Right    At Risk Side Left     BASELINE SCORE (UNILATERAL) 9.6    L-DEX SCORE (UNILATERAL) 6    VALUE CHANGE (UNILAT) -3.6               Denyce Flank, PTA 12/02/2023, 10:34 AM

## 2023-12-18 ENCOUNTER — Other Ambulatory Visit: Payer: Self-pay

## 2023-12-18 ENCOUNTER — Ambulatory Visit: Payer: Medicare HMO | Admitting: Nurse Practitioner

## 2023-12-18 ENCOUNTER — Other Ambulatory Visit: Payer: Medicare HMO

## 2023-12-18 DIAGNOSIS — C50212 Malignant neoplasm of upper-inner quadrant of left female breast: Secondary | ICD-10-CM

## 2023-12-18 DIAGNOSIS — D649 Anemia, unspecified: Secondary | ICD-10-CM

## 2023-12-18 NOTE — Assessment & Plan Note (Signed)
 IDC, Stage IIB, c(T1c, N1), yp(T0, N1) ER 30% weak+, PR-/HER2-, Grade 3  -diagnosed in 10/2021 -s/p neoadjuvant chemo ACx4, followed by weekly taxol 01/22/22 - 03/20/22, discontinued due to hospitalization for massive PE and cardiogenic shock.  -she took anastrozole 04/17/22 - 06/22/22 due to delay in surgery from hospitalization, discontinued due to arthralgias. -s/p left lumpectomy on 07/10/22 under Dr. Luisa Hart, path showed complete pathologic response in breast but 1/4 positive lymph nodes. Repeat prognostic panel on the positive node showed ER 20% weak, PR 2% moderate, and Her2 negative. -she completed concurrent chemoRT with Xeloda on 10/02/2022 -She completed adjuvant Xeloda for 6 months in April 2024. -She has started to exemestane in May 2024, tolerating well overall.  Will continue for 5 years. -3D diagnostic mammogram scheduled for March 2025. -Bone density test ordered today.

## 2023-12-18 NOTE — Progress Notes (Unsigned)
 Patient Care Team: Elie Confer, NP as PCP - General Harriette Bouillon, MD as Consulting Physician (General Surgery) Malachy Mood, MD as Consulting Physician (Hematology) Antony Blackbird, MD as Consulting Physician (Radiation Oncology) Pershing Proud, RN as Oncology Nurse Navigator Donnelly Angelica, RN as Oncology Nurse Navigator Pollyann Samples, NP as Nurse Practitioner (Nurse Practitioner)  Clinic Day:  12/19/2023  Referring physician: Elie Confer, NP  ASSESSMENT & PLAN:   Assessment & Plan: Malignant neoplasm of upper-inner quadrant of left breast in female, estrogen receptor positive (HCC) IDC, Stage IIB, c(T1c, N1), yp(T0, N1) ER 30% weak+, PR-/HER2-, Grade 3  -diagnosed in 10/2021 -s/p neoadjuvant chemo ACx4, followed by weekly taxol 01/22/22 - 03/20/22, discontinued due to hospitalization for massive PE and cardiogenic shock.  -she took anastrozole 04/17/22 - 06/22/22 due to delay in surgery from hospitalization, discontinued due to arthralgias. -s/p left lumpectomy on 07/10/22 under Dr. Luisa Hart, path showed complete pathologic response in breast but 1/4 positive lymph nodes. Repeat prognostic panel on the positive node showed ER 20% weak, PR 2% moderate, and Her2 negative. -she completed concurrent chemoRT with Xeloda on 10/02/2022 -She completed adjuvant Xeloda for 6 months in April 2024. -She has started to exemestane in May 2024, tolerating well overall.  Will continue for 5 years. -3D diagnostic mammogram scheduled for March 2025. -Bone density test ordered today.   Plan: Labs reviewed. Mild and stable anemia.  Recommend she add iron to the prenatal vitamin she is currently taking.  Encouraged her to take with vitamin C as this will help absorption.  Other labs unremarkable. 3D, diagnostic mammogram scheduled for March 2025. Bone density test ordered today. Continue exemestane daily. Labs with follow-up in 6 months.  She knows to contact clinic sooner if needed.   The  patient understands the plans discussed today and is in agreement with them.  She knows to contact our office if she develops concerns prior to her next appointment.  I provided 25 minutes of face-to-face time during this encounter and > 50% was spent counseling as documented under my assessment and plan.    Carlean Jews, NP  [facility] - A DEPT OF [facility] [address] [place] [address] Dept: [phone] Dept Fax: [phone]   Orders Placed This Encounter  Procedures   DG Bone Density    HUM PF; BASELINE/ PT AWARE TO STOP CALI- MULTI VITMS 48 HRS PRIOR/ SPOKE WITH PT/ FJ WT; 187 EPIC ORDER    Standing Status:   Future    Expected Date:   01/16/2024    Expiration Date:   12/18/2024    Scheduling Instructions:     Please schedule with upcoming mammogram if possible    Reason for Exam (SYMPTOM  OR DIAGNOSIS REQUIRED):   estrogen deficiency. history of breast cancer and taking exemestane    Preferred imaging location?:   [facility]      CHIEF COMPLAINT:  CC: Left breast cancer, estrogen receptor positive  Current Treatment: Exemestane starting 02/2023  INTERVAL HISTORY:  Cassandra Brennan is here today for repeat clinical assessment.  She has tolerated treatment with exemestane well.  3D diagnostic mammogram scheduled for 12/2023.  Bone density test ordered today.  Patient reports doing well.  She does have some mild night sweats which are tolerable.  She has recently noted left hip pain and low back pain.  She does follow-up with orthopedics later this week.  She  recently started working out, using stationary bike.  Feels like that may have strained a muscle however, she does have known history of osteoarthritis in both hips and lower back.  She denies any new palpable lumps or masses.  Has noticed no changes in the nipples such as inversion or discharge.  She denies chest pain, chest pressure, or shortness of  breath. She denies headaches or visual disturbances. She denies abdominal pain, nausea, vomiting, or changes in bowel or bladder habits.  She denies fevers or chills. Her appetite is good. Her weight has increased 7 pounds over last 4 months  .  I have reviewed the past medical history, past surgical history, social history and family history with the patient and they are unchanged from previous note.  ALLERGIES:  is allergic to prednisone, rosuvastatin, guaifenesin, sulfa antibiotics, and other.  MEDICATIONS:  Current Outpatient Medications  Medication Sig Dispense Refill   acetaminophen (TYLENOL) 500 MG tablet Take 500 mg by mouth every 8 (eight) hours as needed for moderate pain.     exemestane (AROMASIN) 25 MG tablet Take 1 tablet (25 mg total) by mouth daily. 90 tablet 0   ezetimibe (ZETIA) 10 MG tablet Take 10 mg by mouth daily.     lidocaine-prilocaine (EMLA) cream Apply to affected area once 30 g 3   LISINOPRIL PO Take 1 tablet by mouth daily.     metoprolol succinate (TOPROL-XL) 100 MG 24 hr tablet Take 100 mg by mouth daily. Take with or immediately following a meal.     Multiple Vitamins-Minerals (MULTIVITAMIN ADULTS) TABS Take 1 tablet by mouth daily.     prednisoLONE acetate (PRED FORTE) 1 % ophthalmic suspension Place 1 drop into the right eye 4 (four) times daily.     urea (CARMOL) 10 % cream Apply topically as needed. 71 g 0   No current facility-administered medications for this visit.    HISTORY OF PRESENT ILLNESS:   Oncology History Overview Note   Cancer Staging  Malignant neoplasm of upper-inner quadrant of left breast in female, estrogen receptor positive (HCC) Staging form: Breast, AJCC 8th Edition - Clinical stage from 10/27/2021: Stage IIB (cT1c, cN1, cM0, G3, ER+, PR-, HER2-) - Signed by Pollyann Samples, NP on 11/28/2021 - Pathologic stage from 07/10/2022: No Stage Recommended (ypT0, pN1, cM0, G3, ER+, PR-, HER2-) - Unsigned     Malignant neoplasm of upper-inner  quadrant of left breast in female, estrogen receptor positive (HCC)  10/19/2021 Mammogram   CLINICAL DATA:  Patient presents for palpable left axillary abnormality.   EXAM: DIGITAL DIAGNOSTIC BILATERAL MAMMOGRAM WITH TOMOSYNTHESIS AND CAD; ULTRASOUND LEFT BREAST LIMITED  IMPRESSION: Suspicious left breast mass 10 o'clock position.   Adjacent suspicious satellite nodule left breast 12 o'clock position.   Palpable mass left axilla corresponds with a large lymph node.   10/27/2021 Initial Biopsy   Diagnosis 1. Breast, left, needle core biopsy, 12 o'clock, ribbon - FIBROADENOMA - NO MALIGNANCY IDENTIFIED 2. Breast, left, needle core biopsy, 10 o'clock, coil - INVASIVE DUCTAL CARCINOMA WITH NECROSIS - DUCTAL CARCINOMA IN SITU - SEE COMMENT 3. Lymph node, needle/core biopsy, left axilla, tribell - INVASIVE DUCTAL CARCINOMA WITH NECROSIS - NO NODAL TISSUE IDENTIFIED - SEE COMMENT Microscopic Comment 2. and 2. Based on the biopsy, the carcinoma appears Nottingham grade 3 of 3 and measures 0.8 cm in greatest linear extent.  3. PROGNOSTIC INDICATORS Results: The tumor cells are NEGATIVE for Her2 (1+). Estrogen Receptor: 30%, POSITIVE, WEAK STAINING INTENSITY Progesterone Receptor: 0%, NEGATIVE Proliferation  Marker Ki67: 60%   10/27/2021 Cancer Staging   Staging form: Breast, AJCC 8th Edition - Clinical stage from 10/27/2021: Stage IIB (cT1c, cN1, cM0, G3, ER+, PR-, HER2-) - Signed by Pollyann Samples, NP on 11/28/2021 Stage prefix: Initial diagnosis Histologic grading system: 3 grade system   11/06/2021 Initial Diagnosis   Malignant neoplasm of upper-inner quadrant of left breast in female, estrogen receptor positive (HCC)   11/15/2021 Breast MRI   IMPRESSION: 1.9 cm mass in the upper-inner quadrant of the left breast corresponding with the known invasive ductal carcinoma. Enlarged left axillary lymph node corresponding with known metastatic disease.   RECOMMENDATION: Treatment  planning of the known left breast cancer and axillary metastasis is recommended.   Additional imaging evaluation of the mass in the liver is recommended with CT with liver mass protocol or MRI.   11/22/2021 Imaging   Bone scan IMPRESSION: No scintigraphic evidence of bony metastatic disease. Degenerative changes as above.   11/22/2021 Echocardiogram   Baseline echo LVEF 55-60%, normal GLS -21.7%   11/23/2021 Imaging   CT CAP IMPRESSION: 1. Bulky lymphadenopathy in the left axilla including a 5.0 x 2.8 cm lymph node. 2. 15 mm nodule identified in the upper inner quadrant of the left breast. 3. No suspicious pulmonary nodule or mass. No evidence for metastatic disease in the abdomen or pelvis. 4. 11 mm cyst in the dome of the left liver. Adjacent tiny hypoattenuating lesions in the right liver are too small to characterize, but likely benign. Attention on follow-up recommended. 5. Prominent stool volume raises the question of clinical constipation.   11/28/2021 - 03/20/2022 Chemotherapy   Patient is on Treatment Plan : BREAST ADJUVANT DOSE DENSE AC q14d / PACLitaxel q7d      Genetic Testing   [facility] was Negative. Report date is 11/26/2021.   The CancerNext gene panel offered by [employer] includes sequencing, rearrangement analysis, and RNA analysis for the following 36 genes:   APC, ATM, AXIN2, BARD1, BMPR1A, BRCA1, BRCA2, BRIP1, CDH1, CDK4, CDKN2A, CHEK2, DICER1, HOXB13, EPCAM, GREM1, MLH1, MSH2, MSH3, MSH6, MUTYH, NBN, NF1, NTHL1, PALB2, PMS2, POLD1, POLE, PTEN, RAD51C, RAD51D, RECQL, SMAD4, SMARCA4, STK11, and TP53.    07/10/2022 Definitive Surgery   FINAL MICROSCOPIC DIAGNOSIS:   A. BREAST, LEFT, LUMPECTOMY:  - No residual carcinoma identified  - Treatment effect present (see comment)  - Ductal carcinoma in situ present, ypTis  - Margins negative for ductal carcinoma in situ  - See oncology table   B. BREAST, LEFT ADDITIONAL MEDIAL MARGIN, EXCISION:  - Negative for  carcinoma   C. BREAST, LEFT ADDITIONAL LATERAL MARGIN, EXCISION:  - Negative for carcinoma   D. LYMPH NODE, LEFT AXILLARY, SENTINEL, EXCISION:  - Positive for carcinoma (1/1)   E. LYMPH NODE, LEFT AXILLARY, SENTINEL, EXCISION:  - Negative for carcinoma (0/1)   F. LYMPH NODE, LEFT, AXILLARY CONTENTS:  - Two lymph nodes identified  - Negative for carcinoma (0/2)    ADDENDUM:  Part D: Left axillary sentinel lymph node,   PROGNOSTIC INDICATOR RESULTS:  The tumor cells are NEGATIVE for Her2 (0).  Estrogen Receptor:       POSITIVE, 20%, WEAK STAINING  Progesterone Receptor:   POSITIVE, 2%, MODERATE STAINING    08/15/2022 - 10/02/2022 Radiation Therapy   Radiation Treatment Dates: 08/15/2022 through 10/02/2022 Site Technique Total Dose (Gy) Dose per Fx (Gy) Completed Fx Beam Energies  Breast, Left: Breast_L 3D 50.4/50.4 1.8 28/28 10X  Breast, Left: Breast_L_SCV_PAB 3D 50.4/50.4 1.8 28/28 6X,  15X  Breast, Left: Breast_L_Bst 3D 10/10 2 5/5 6X, 10X    Took adjuvant Xeloda concurrent with radiation then continued Xeloda until 01/2023   03/19/2023 Survivorship   SCP delivered by Santiago Glad, NP   02/2023 -  Anti-estrogen oral therapy   Began Exemestane       REVIEW OF SYSTEMS:   Constitutional: Denies fevers, chills or abnormal weight loss Eyes: Denies blurriness of vision Ears, nose, mouth, throat, and face: Denies mucositis or sore throat Respiratory: Denies cough, dyspnea or wheezes Cardiovascular: Denies palpitation, chest discomfort or lower extremity swelling Gastrointestinal:  Denies nausea, heartburn or change in bowel habits Skin: Denies abnormal skin rashes Lymphatics: Denies new lymphadenopathy or easy bruising Neurological:Denies numbness, tingling or new weaknesses Behavioral/Psych: Mood is stable, no new changes  All other systems were reviewed with the patient and are negative.   VITALS:   Today's Vitals   12/19/23 0828 12/19/23 0836  BP: 138/87   Pulse:  91   Resp: 16   Temp: 98.2 F (36.8 C)   TempSrc: Temporal   SpO2: 100%   Weight: 187 lb 8 oz (85 kg)   PainSc:  0-No pain   Body mass index is 31.2 kg/m.   Wt Readings from Last 3 Encounters:  12/19/23 187 lb 8 oz (85 kg)  12/02/23 184 lb 4 oz (83.6 kg)  09/02/23 180 lb (81.6 kg)    Body mass index is 31.2 kg/m.  Performance status (ECOG): 1 - Symptomatic but completely ambulatory  PHYSICAL EXAM:   GENERAL:alert, no distress and comfortable SKIN: skin color, texture, turgor are normal, no rashes or significant lesions EYES: normal, Conjunctiva are pink and non-injected, sclera clear OROPHARYNX:no exudate, no erythema and lips, buccal mucosa, and tongue normal  NECK: supple, thyroid normal size, non-tender, without nodularity LYMPH:  no palpable lymphadenopathy in the cervical, axillary or inguinal LUNGS: clear to auscultation and percussion with normal breathing effort HEART: regular rate & rhythm and no murmurs and no lower extremity edema ABDOMEN:abdomen soft, non-tender and normal bowel sounds Musculoskeletal:no cyanosis of digits and no clubbing  NEURO: alert & oriented x 3 with fluent speech, no focal motor/sensory deficits BREAST: The right breast is without palpable mass or lump.  There is no nipple inversion or nipple discharge.  No palpable axillary lymphadenopathy on the right.  There are stable changes associated with lumpectomy scar axillary scar.  No tenderness with palpation.  No lumps or masses noted today.  There is no nipple inversion or nipple discharge.  No axillary lymphadenopathy on the left side.  LABORATORY DATA:  I have reviewed the data as listed    Component Value Date/Time   NA 142 12/19/2023 0801   K 3.8 12/19/2023 0801   CL 106 12/19/2023 0801   CO2 29 12/19/2023 0801   GLUCOSE 116 (H) 12/19/2023 0801   BUN 17 12/19/2023 0801   CREATININE 1.07 (H) 12/19/2023 0801   CREATININE 1.06 (H) 03/15/2020 0350   CALCIUM 9.7 12/19/2023 0801   PROT  7.4 12/19/2023 0801   ALBUMIN 4.4 12/19/2023 0801   AST 16 12/19/2023 0801   ALT 24 12/19/2023 0801   ALKPHOS 109 12/19/2023 0801   BILITOT 0.4 12/19/2023 0801   GFRNONAA 57 (L) 12/19/2023 0801   GFRNONAA 56 (L) 03/15/2020 0350   GFRAA 65 03/15/2020 0350    No results found for: "SPEP", "UPEP"  Lab Results  Component Value Date   WBC 7.2 12/19/2023   NEUTROABS 5.4 12/19/2023   HGB 11.7 (L)  12/19/2023   HCT 34.2 (L) 12/19/2023   MCV 88.8 12/19/2023   PLT 263 12/19/2023      Chemistry      Component Value Date/Time   NA 142 12/19/2023 0801   K 3.8 12/19/2023 0801   CL 106 12/19/2023 0801   CO2 29 12/19/2023 0801   BUN 17 12/19/2023 0801   CREATININE 1.07 (H) 12/19/2023 0801   CREATININE 1.06 (H) 03/15/2020 0350      Component Value Date/Time   CALCIUM 9.7 12/19/2023 0801   ALKPHOS 109 12/19/2023 0801   AST 16 12/19/2023 0801   ALT 24 12/19/2023 0801   BILITOT 0.4 12/19/2023 0801       RADIOGRAPHIC STUDIES: I have personally reviewed the radiological images as listed and agreed with the findings in the report. No results found.

## 2023-12-19 ENCOUNTER — Encounter: Payer: Self-pay | Admitting: Nurse Practitioner

## 2023-12-19 ENCOUNTER — Inpatient Hospital Stay: Payer: Medicare PPO | Attending: Nurse Practitioner

## 2023-12-19 ENCOUNTER — Inpatient Hospital Stay (HOSPITAL_BASED_OUTPATIENT_CLINIC_OR_DEPARTMENT_OTHER): Payer: Medicare PPO | Admitting: Nurse Practitioner

## 2023-12-19 VITALS — BP 138/87 | HR 91 | Temp 98.2°F | Resp 16 | Wt 187.5 lb

## 2023-12-19 DIAGNOSIS — Z1722 Progesterone receptor negative status: Secondary | ICD-10-CM | POA: Insufficient documentation

## 2023-12-19 DIAGNOSIS — Z882 Allergy status to sulfonamides status: Secondary | ICD-10-CM | POA: Insufficient documentation

## 2023-12-19 DIAGNOSIS — E2839 Other primary ovarian failure: Secondary | ICD-10-CM

## 2023-12-19 DIAGNOSIS — Z79811 Long term (current) use of aromatase inhibitors: Secondary | ICD-10-CM | POA: Diagnosis not present

## 2023-12-19 DIAGNOSIS — M25552 Pain in left hip: Secondary | ICD-10-CM | POA: Insufficient documentation

## 2023-12-19 DIAGNOSIS — Z1732 Human epidermal growth factor receptor 2 negative status: Secondary | ICD-10-CM | POA: Diagnosis not present

## 2023-12-19 DIAGNOSIS — Z86711 Personal history of pulmonary embolism: Secondary | ICD-10-CM | POA: Diagnosis not present

## 2023-12-19 DIAGNOSIS — Z79899 Other long term (current) drug therapy: Secondary | ICD-10-CM | POA: Insufficient documentation

## 2023-12-19 DIAGNOSIS — C50212 Malignant neoplasm of upper-inner quadrant of left female breast: Secondary | ICD-10-CM | POA: Diagnosis not present

## 2023-12-19 DIAGNOSIS — Z888 Allergy status to other drugs, medicaments and biological substances status: Secondary | ICD-10-CM | POA: Insufficient documentation

## 2023-12-19 DIAGNOSIS — Z170 Estrogen receptor positive status [ER+]: Secondary | ICD-10-CM | POA: Insufficient documentation

## 2023-12-19 DIAGNOSIS — R61 Generalized hyperhidrosis: Secondary | ICD-10-CM | POA: Diagnosis not present

## 2023-12-19 DIAGNOSIS — M545 Low back pain, unspecified: Secondary | ICD-10-CM | POA: Insufficient documentation

## 2023-12-19 DIAGNOSIS — D649 Anemia, unspecified: Secondary | ICD-10-CM

## 2023-12-19 LAB — CBC WITH DIFFERENTIAL (CANCER CENTER ONLY)
Abs Immature Granulocytes: 0.02 10*3/uL (ref 0.00–0.07)
Basophils Absolute: 0 10*3/uL (ref 0.0–0.1)
Basophils Relative: 0 %
Eosinophils Absolute: 0.4 10*3/uL (ref 0.0–0.5)
Eosinophils Relative: 5 %
HCT: 34.2 % — ABNORMAL LOW (ref 36.0–46.0)
Hemoglobin: 11.7 g/dL — ABNORMAL LOW (ref 12.0–15.0)
Immature Granulocytes: 0 %
Lymphocytes Relative: 16 %
Lymphs Abs: 1.1 10*3/uL (ref 0.7–4.0)
MCH: 30.4 pg (ref 26.0–34.0)
MCHC: 34.2 g/dL (ref 30.0–36.0)
MCV: 88.8 fL (ref 80.0–100.0)
Monocytes Absolute: 0.3 10*3/uL (ref 0.1–1.0)
Monocytes Relative: 4 %
Neutro Abs: 5.4 10*3/uL (ref 1.7–7.7)
Neutrophils Relative %: 75 %
Platelet Count: 263 10*3/uL (ref 150–400)
RBC: 3.85 MIL/uL — ABNORMAL LOW (ref 3.87–5.11)
RDW: 13.8 % (ref 11.5–15.5)
WBC Count: 7.2 10*3/uL (ref 4.0–10.5)
nRBC: 0 % (ref 0.0–0.2)

## 2023-12-19 LAB — CMP (CANCER CENTER ONLY)
ALT: 24 U/L (ref 0–44)
AST: 16 U/L (ref 15–41)
Albumin: 4.4 g/dL (ref 3.5–5.0)
Alkaline Phosphatase: 109 U/L (ref 38–126)
Anion gap: 7 (ref 5–15)
BUN: 17 mg/dL (ref 8–23)
CO2: 29 mmol/L (ref 22–32)
Calcium: 9.7 mg/dL (ref 8.9–10.3)
Chloride: 106 mmol/L (ref 98–111)
Creatinine: 1.07 mg/dL — ABNORMAL HIGH (ref 0.44–1.00)
GFR, Estimated: 57 mL/min — ABNORMAL LOW (ref >60)
Glucose, Bld: 116 mg/dL — ABNORMAL HIGH (ref 70–99)
Potassium: 3.8 mmol/L (ref 3.5–5.1)
Sodium: 142 mmol/L (ref 135–145)
Total Bilirubin: 0.4 mg/dL (ref 0.0–1.2)
Total Protein: 7.4 g/dL (ref 6.5–8.1)

## 2023-12-19 LAB — SAMPLE TO BLOOD BANK

## 2023-12-20 ENCOUNTER — Telehealth: Payer: Self-pay | Admitting: Nurse Practitioner

## 2023-12-20 NOTE — Telephone Encounter (Signed)
 Patient is aware of scheduled appointment times/dates

## 2024-01-16 ENCOUNTER — Ambulatory Visit
Admission: RE | Admit: 2024-01-16 | Discharge: 2024-01-16 | Disposition: A | Discharge status: Home / Self Care | Payer: Medicare HMO | Source: Ambulatory Visit | Attending: Hematology

## 2024-01-16 DIAGNOSIS — C50212 Malignant neoplasm of upper-inner quadrant of left female breast: Secondary | ICD-10-CM

## 2024-03-14 ENCOUNTER — Other Ambulatory Visit: Payer: Self-pay | Admitting: Hematology

## 2024-03-17 ENCOUNTER — Other Ambulatory Visit: Payer: Self-pay

## 2024-03-17 ENCOUNTER — Ambulatory Visit: Payer: Medicare PPO

## 2024-03-25 ENCOUNTER — Encounter: Payer: Self-pay | Admitting: Emergency Medicine

## 2024-03-25 ENCOUNTER — Ambulatory Visit
Admission: EM | Admit: 2024-03-25 | Discharge: 2024-03-25 | Disposition: A | Discharge status: Home / Self Care | Attending: Physician Assistant | Admitting: Physician Assistant

## 2024-03-25 DIAGNOSIS — N3001 Acute cystitis with hematuria: Secondary | ICD-10-CM | POA: Diagnosis not present

## 2024-03-25 LAB — POCT URINALYSIS DIP (MANUAL ENTRY)
Bilirubin, UA: NEGATIVE
Glucose, UA: 100 mg/dL — AB
Ketones, POC UA: NEGATIVE mg/dL
Nitrite, UA: POSITIVE — AB
Protein Ur, POC: 100 mg/dL — AB
Spec Grav, UA: <=1.005 — AB (ref 1.010–1.025)
Urobilinogen, UA: 0.2 U/dL
pH, UA: 5.5 (ref 5.0–8.0)

## 2024-03-25 MED ORDER — CEPHALEXIN 500 MG PO CAPS
500.0000 mg | ORAL_CAPSULE | Freq: Four times a day (QID) | ORAL | 0 refills | Status: AC
Start: 2024-03-25 — End: ?

## 2024-03-25 MED ORDER — CEFTRIAXONE SODIUM 1 G IJ SOLR
1.0000 g | Freq: Once | INTRAMUSCULAR | Status: AC
  Administered 2024-03-25: 1 g via INTRAMUSCULAR

## 2024-03-25 NOTE — Discharge Instructions (Signed)
 We are treating you for urinary tract infection.  Please start cephalexin  4 times daily for 5 days.  We gave you an injection of antibiotics to help start the treatment process.  Make sure you rest and drink plenty of fluid.  I will contact you if need to stop or change your antibiotics based on your culture results. There was some blood noted in your urinalysis and so I recommend that you follow-up with your primary care in 2 to 4 weeks to have this repeated and ensure that it goes away once we have treated the infection. If anything worsens and you have abdominal pain, blood in your urine, nausea, vomiting, fever, weakness, persistent or worsening symptoms you should be seen immediately.

## 2024-03-25 NOTE — ED Provider Notes (Addendum)
 [facility]    CSN: [id number] Arrival date & time: 03/25/24  1040      History   Chief Complaint No chief complaint on file.   HPI Cassandra Brennan is a 68 y.o. female.   Patient presents today with a 48-hour history of UTI symptoms.  She reports frequency, dysuria, urinary urgency.  Denies any abdominal pain, pelvic pain, vaginal symptoms, fever.  She did take Pyridium  without improvement of symptoms.  Denies history of recurrent UTI, nephrolithiasis, single kidney, recent catheterization.  She does have a history of malignancy but is not currently receiving chemotherapy.  She denies any recent antibiotic use.  She denies history of diabetes and does not take SGLT2 inhibitor.  Reports that she is feeling poorly and not her normal self.    Past Medical History:  Diagnosis Date   Acute dyspnea    Acute metabolic encephalopathy    Anxiety    Arthritis    right knee    Cataract    Elevated troponin    Femoral DVT (deep venous thrombosis) (HCC)    GERD (gastroesophageal reflux disease)    History of radiation therapy    Left breast 08/15/22-10/02/22- Dr. Retta Caster   History of radiation therapy    Left breast- 08/15/22-10/02/22- Dr. Retta Caster   Hyperlipidemia    Hypertension    Lactic acidosis    Obstructive cardiovascular shock (HCC) 03/22/2022   Pneumonia    Positive colorectal cancer screening using Cologuard test    Pulmonary embolism (HCC)    03/22/22    Patient Active Problem List   Diagnosis Date Noted   Right leg DVT (HCC) 03/25/2022   Pneumonia 03/23/2022   AKI (acute kidney injury) (HCC) 03/23/2022   history of massive pulmonary embolism (HCC) 03/22/2022   GERD (gastroesophageal reflux disease)    Chronic anemia    Cardiogenic shock (HCC)    Anxiety 12/08/2021   Genetic testing 11/30/2021   Port-A-Cath in place 11/28/2021   Malignant neoplasm of upper-inner quadrant of left breast in female, estrogen receptor positive (HCC) 11/06/2021    Hypertension 03/15/2020    Past Surgical History:  Procedure Laterality Date   BREAST CYST EXCISION Right    pt stated in 1980   BREAST LUMPECTOMY WITH RADIOACTIVE SEED AND SENTINEL LYMPH NODE BIOPSY Left 07/10/2022   Procedure: LEFT BREAST LUMPECTOMY WITH RADIOACTIVE SEED AND LEFT  SENTINEL LYMPH NODE MAPPING;  Surgeon: Sim Dryer, MD;  Location: [facility];  Service: General;  Laterality: Left;   BREAST SURGERY     other GI testing     unsure but had to drink a chalky drink    PORTACATH PLACEMENT N/A 11/27/2021   Procedure: INSERTION PORT-A-CATH;  Surgeon: Sim Dryer, MD;  Location: [facility];  Service: General;  Laterality: N/A;   RADIOACTIVE SEED GUIDED AXILLARY SENTINEL LYMPH NODE Left 07/10/2022   Procedure: LEFT AXILLARY SEED LYMPH NODE BIOPSY;  Surgeon: Sim Dryer, MD;  Location: [facility];  Service: General;  Laterality: Left;   TONSILLECTOMY     TUBAL LIGATION      OB History   No obstetric history on file.      Home Medications    Prior to Admission medications   Medication Sig Start Date End Date Taking? Authorizing Provider  cephALEXin  (KEFLEX ) 500 MG capsule Take 1 capsule (500 mg total) by mouth 4 (four) times daily. 03/25/24  Yes Angelik Walls K, PA-C  acetaminophen  (TYLENOL ) 500 MG tablet Take 500 mg  by mouth every 8 (eight) hours as needed for moderate pain.    [provider]  exemestane  (AROMASIN ) 25 MG tablet TAKE 1 TABLET EVERY DAY 03/17/24   Boscia, Heather E, NP  ezetimibe (ZETIA) 10 MG tablet Take 10 mg by mouth daily. 02/08/23   [provider]  lidocaine -prilocaine  (EMLA ) cream Apply to affected area once 11/16/21   Sonja [place], MD  LISINOPRIL PO Take 1 tablet by mouth daily.    [provider]  metoprolol  succinate (TOPROL -XL) 100 MG 24 hr tablet Take 100 mg by mouth daily. Take with or immediately following a meal.    [provider]  Multiple Vitamins-Minerals (MULTIVITAMIN ADULTS)  TABS Take 1 tablet by mouth daily.    [provider]  urea  (CARMOL) 10 % cream Apply topically as needed. 01/18/23   Sonja Garland, MD    Family History Family History  Problem Relation Age of Onset   Colon polyps Sister    Colon polyps Sister    Colon polyps Sister    Colon cancer Maternal Uncle        dx. 50s   Throat cancer Maternal Uncle    Colon cancer Cousin        dx. >50   Gastric cancer Cousin        dx. >50   Breast cancer Cousin        dx. 30s   Kidney cancer Nephew    Esophageal cancer Neg Hx    Rectal cancer Neg Hx    Stomach cancer Neg Hx     Social History Social History   Tobacco Use   Smoking status: Never   Smokeless tobacco: Never  Vaping Use   Vaping status: Never Used  Substance Use Topics   Alcohol use: No   Drug use: No     Allergies   Prednisone, Rosuvastatin, Guaifenesin, Sulfa antibiotics, and Other   Review of Systems Review of Systems  Constitutional:  Positive for activity change. Negative for appetite change, fatigue and fever.  Respiratory:  Negative for shortness of breath.   Cardiovascular:  Negative for chest pain.  Gastrointestinal:  Negative for abdominal pain, diarrhea, nausea and vomiting.  Genitourinary:  Positive for dysuria, frequency and urgency. Negative for pelvic pain, vaginal bleeding, vaginal discharge and vaginal pain.  Musculoskeletal:  Negative for arthralgias, back pain and myalgias.     Physical Exam Triage Vital Signs ED Triage Vitals  Encounter Vitals Group     BP 03/25/24 1045 134/81     Systolic BP Percentile --      Diastolic BP Percentile --      Pulse Rate 03/25/24 1045 (!) 102     Resp 03/25/24 1045 18     Temp 03/25/24 1045 98.6 F (37 C)     Temp Source 03/25/24 1045 Oral     SpO2 03/25/24 1045 95 %     Weight --      Height --      Head Circumference --      Peak Flow --      Pain Score 03/25/24 1046 3     Pain Loc --      Pain Education --      Exclude from Growth Chart --     No data found.  Updated Vital Signs BP 134/81 (BP Location: Right Arm)   Pulse (!) 102   Temp 98.6 F (37 C) (Oral)   Resp 18   SpO2 95%   Visual Acuity  Right Eye Distance:   Left Eye Distance:   Bilateral Distance:    Right Eye Near:   Left Eye Near:    Bilateral Near:     Physical Exam Vitals reviewed.  Constitutional:      General: She is awake. She is not in acute distress.    Appearance: Normal appearance. She is well-developed. She is not ill-appearing.     Comments: Very pleasant female appears stated age in no acute distress sitting comfortably in exam room  HENT:     Head: Normocephalic and atraumatic.  Cardiovascular:     Rate and Rhythm: Normal rate and regular rhythm.     Heart sounds: Normal heart sounds, S1 normal and S2 normal. No murmur heard. Pulmonary:     Effort: Pulmonary effort is normal.     Breath sounds: Normal breath sounds. No wheezing, rhonchi or rales.     Comments: Clear to auscultation bilaterally Abdominal:     General: Bowel sounds are normal.     Palpations: Abdomen is soft.     Tenderness: There is no abdominal tenderness. There is no right CVA tenderness, left CVA tenderness, guarding or rebound.     Comments: Benign abdominal exam  Psychiatric:        Behavior: Behavior is cooperative.      UC Treatments / Results  Labs (all labs ordered are listed, but only abnormal results are displayed) Labs Reviewed  POCT URINALYSIS DIP (MANUAL ENTRY) - Abnormal; Notable for the following components:      Result Value   Color, UA orange (*)    Glucose, UA =100 (*)    Spec Grav, UA <=1.005 (*)    Blood, UA moderate (*)    Protein Ur, POC =100 (*)    Nitrite, UA Positive (*)    Leukocytes, UA Large (3+) (*)    All other components within normal limits  URINE CULTURE    EKG   Radiology No results found.  Procedures Procedures (including critical care time)  Medications Ordered in UC Medications  cefTRIAXone  (ROCEPHIN )  injection 1 g (1 g Intramuscular Given 03/25/24 1114)    Initial Impression / Assessment and Plan / UC Course  I have reviewed the triage vital signs and the nursing notes.  Pertinent labs & imaging results that were available during my care of the patient were reviewed by me and considered in my medical decision making (see chart for details).     Patient is well-appearing, afebrile, nontoxic.  Vital signs and physical exam are reassuring with no indication for emergent evaluation or imaging.  UA was obtained that was consistent with urinary tract infection.  She was given a dose of Rocephin  1 g in clinic today and we will start cephalexin  500 mg 4 times daily for 5 days.  No indication for dose adjustment based on metabolic panel from 12/19/2023 with creatinine of 1.07 calculated creatinine clearance of 69.25 mm/min.  Will send this for culture and contact her if we need to discontinue or change her antibiotics based on culture results.  Recommend that she rest and drink plenty of fluid.  She was noted to have some blood on her UA which is likely related to infection.  Recommended that she follow-up with her PCP to have UA repeated in 2 to 4 weeks to ensure that this resolves with treatment of infection.  Discussed that if anything changes or worsens and she has a severe abdominal pain, fever, nausea, vomiting, weakness, persistent or worsening urinary  symptoms she needs to be seen immediately.  Strict return precautions given.  Excuse note provided.   Final Clinical Impressions(s) / UC Diagnoses   Final diagnoses:  Acute cystitis with hematuria     Discharge Instructions      We are treating you for urinary tract infection.  Please start cephalexin  4 times daily for 5 days.  We gave you an injection of antibiotics to help start the treatment process.  Make sure you rest and drink plenty of fluid.  I will contact you if need to stop or change your antibiotics based on your culture results. There  was some blood noted in your urinalysis and so I recommend that you follow-up with your primary care in 2 to 4 weeks to have this repeated and ensure that it goes away once we have treated the infection. If anything worsens and you have abdominal pain, blood in your urine, nausea, vomiting, fever, weakness, persistent or worsening symptoms you should be seen immediately.     ED Prescriptions     Medication Sig Dispense Auth. Provider   cephALEXin  (KEFLEX ) 500 MG capsule Take 1 capsule (500 mg total) by mouth 4 (four) times daily. 20 capsule Monesha Monreal K, PA-C      PDMP not reviewed this encounter.   Budd Cargo, PA-C 03/25/24 1111    Derika Eckles, Betsey Brow, PA-C 03/25/24 1121

## 2024-03-25 NOTE — ED Triage Notes (Signed)
 Urinary frequency and burning on urination since Monday.  Took pyridium  this morning

## 2024-03-27 ENCOUNTER — Ambulatory Visit (HOSPITAL_COMMUNITY): Payer: Self-pay

## 2024-03-27 LAB — URINE CULTURE: Culture: 50000 — AB

## 2024-04-09 ENCOUNTER — Other Ambulatory Visit (HOSPITAL_COMMUNITY)

## 2024-05-04 ENCOUNTER — Ambulatory Visit: Attending: Surgery

## 2024-05-04 VITALS — Wt 185.2 lb

## 2024-05-04 DIAGNOSIS — Z483 Aftercare following surgery for neoplasm: Secondary | ICD-10-CM | POA: Insufficient documentation

## 2024-05-04 NOTE — Therapy (Signed)
 OUTPATIENT PHYSICAL THERAPY SOZO SCREENING NOTE   Patient Name: Cassandra Brennan MRN: [id number] DOB:04/07/1956, 68 y.o., female Today's Date: 05/04/2024  PCP: Cristopher Suzen CHRISTELLA, NP REFERRING PROVIDER: Vanderbilt Ned, MD   PT End of Session - 05/04/24 1058     Visit Number 11   # unchanged due to screen only   PT Start Time 1056    PT Stop Time 1100    PT Time Calculation (min) 4 min    Activity Tolerance Patient tolerated treatment well    Behavior During Therapy WFL for tasks assessed/performed          Past Medical History:  Diagnosis Date   Acute dyspnea    Acute metabolic encephalopathy    Anxiety    Arthritis    right knee    Cataract    Elevated troponin    Femoral DVT (deep venous thrombosis) (HCC)    GERD (gastroesophageal reflux disease)    History of radiation therapy    Left breast 08/15/22-10/02/22- Dr. Lynwood Nasuti   History of radiation therapy    Left breast- 08/15/22-10/02/22- Dr. Lynwood Nasuti   Hyperlipidemia    Hypertension    Lactic acidosis    Obstructive cardiovascular shock (HCC) 03/22/2022   Pneumonia    Positive colorectal cancer screening using Cologuard test    Pulmonary embolism (HCC)    03/22/22   Past Surgical History:  Procedure Laterality Date   BREAST CYST EXCISION Right    pt stated in 1980   BREAST LUMPECTOMY WITH RADIOACTIVE SEED AND SENTINEL LYMPH NODE BIOPSY Left 07/10/2022   Procedure: LEFT BREAST LUMPECTOMY WITH RADIOACTIVE SEED AND LEFT  SENTINEL LYMPH NODE MAPPING;  Surgeon: Vanderbilt Ned, MD;  Location: [facility];  Service: General;  Laterality: Left;   BREAST SURGERY     other GI testing     unsure but had to drink a chalky drink    PORTACATH PLACEMENT N/A 11/27/2021   Procedure: INSERTION PORT-A-CATH;  Surgeon: Vanderbilt Ned, MD;  Location: [facility];  Service: General;  Laterality: N/A;   RADIOACTIVE SEED GUIDED AXILLARY SENTINEL LYMPH NODE Left 07/10/2022   Procedure: LEFT AXILLARY SEED LYMPH NODE  BIOPSY;  Surgeon: Vanderbilt Ned, MD;  Location: [facility];  Service: General;  Laterality: Left;   TONSILLECTOMY     TUBAL LIGATION     Patient Active Problem List   Diagnosis Date Noted   Right leg DVT (HCC) 03/25/2022   Pneumonia 03/23/2022   AKI (acute kidney injury) (HCC) 03/23/2022   history of massive pulmonary embolism (HCC) 03/22/2022   GERD (gastroesophageal reflux disease)    Chronic anemia    Cardiogenic shock (HCC)    Anxiety 12/08/2021   Genetic testing 11/30/2021   Port-A-Cath in place 11/28/2021   Malignant neoplasm of upper-inner quadrant of left breast in female, estrogen receptor positive (HCC) 11/06/2021   Hypertension 03/15/2020    REFERRING DIAG: left breast cancer at risk for lymphedema  THERAPY DIAG: Aftercare following surgery for neoplasm  PERTINENT HISTORY: Patient was diagnosed on 10/19/2022 with left grade III invasive ductal carcinoma breast cancer. It measures 2 cm and is located in the upper inner quadrant. It is weakly ER positive, PR negative and HER2 negative with a Ki67 of 60%. L breast lumpectomy and SLNB on 07/10/22 (1/4), had blood clots in her lung   PRECAUTIONS: left UE Lymphedema risk, None  SUBJECTIVE: Pt returns for her last 3 month L-Dex screen.   PAIN:  Are you having  pain? No  SOZO SCREENING: Patient was assessed today using the SOZO machine to determine the lymphedema index score. This was compared to her baseline score. It was determined that she is within the recommended range when compared to her baseline and no further action is needed at this time. She will continue SOZO screenings. These are done every 3 months for 2 years post operatively followed by every 6 months for 2 years, and then annually.   L-DEX FLOWSHEETS - 05/04/24 1000       L-DEX LYMPHEDEMA SCREENING   Measurement Type Unilateral    L-DEX MEASUREMENT EXTREMITY Upper Extremity    POSITION  Standing    DOMINANT SIDE Right    At Risk Side Left     BASELINE SCORE (UNILATERAL) 9.6    L-DEX SCORE (UNILATERAL) 7.9    VALUE CHANGE (UNILAT) -1.7         P: Pt to transition to 6 month screens.    Aden Berwyn Caldron, PTA 05/04/2024, 10:59 AM

## 2024-05-22 ENCOUNTER — Encounter (HOSPITAL_COMMUNITY): Payer: Self-pay | Admitting: Nurse Practitioner

## 2024-05-26 ENCOUNTER — Ambulatory Visit (HOSPITAL_COMMUNITY)
Admission: RE | Admit: 2024-05-26 | Discharge: 2024-05-26 | Disposition: A | Discharge status: Home / Self Care | Source: Ambulatory Visit | Attending: Nurse Practitioner | Admitting: Nurse Practitioner

## 2024-05-26 DIAGNOSIS — C50212 Malignant neoplasm of upper-inner quadrant of left female breast: Secondary | ICD-10-CM | POA: Diagnosis present

## 2024-05-26 DIAGNOSIS — E2839 Other primary ovarian failure: Secondary | ICD-10-CM | POA: Insufficient documentation

## 2024-05-26 DIAGNOSIS — Z170 Estrogen receptor positive status [ER+]: Secondary | ICD-10-CM | POA: Diagnosis present

## 2024-06-16 ENCOUNTER — Other Ambulatory Visit: Payer: Self-pay | Admitting: Nurse Practitioner

## 2024-06-16 DIAGNOSIS — C50212 Malignant neoplasm of upper-inner quadrant of left female breast: Secondary | ICD-10-CM

## 2024-06-16 NOTE — Assessment & Plan Note (Signed)
 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  -she took anastrozole  04/17/22 - 06/22/22 due to delay in surgery from hospitalization, discontinued due to arthralgias. -s/p left lumpectomy on 07/10/22 under Dr. Vanderbilt, path showed complete pathologic response in breast but 1/4 positive lymph nodes. Repeat prognostic panel on the positive node showed ER 20% weak, PR 2% moderate, and Her2 negative. -she completed concurrent chemoRT with Xeloda  on 10/02/2022 -She completed adjuvant Xeloda  for 6 months in April 2024. -She has started to exemestane  in May 2024, tolerating well overall.  Will continue for 5 years. -3D diagnostic mammogram 01/16/2024 was benign with category B breast density.  Repeat expected in - DEXA scan on 05/27/2024 was normal. -Continue exemestane  daily. -Labs and follow-up in 6 months, sooner if needed.

## 2024-06-16 NOTE — Progress Notes (Signed)
 Patient Care Team: Cristopher Suzen HERO, NP as PCP - General Vanderbilt Ned, MD as Consulting Physician (General Surgery) Lanny Callander, MD as Consulting Physician (Hematology) Shannon Agent, MD as Consulting Physician (Radiation Oncology) Glean Stephane BROCKS, RN (Inactive) as Oncology Nurse Navigator Tyree Nanetta SAILOR, RN as Oncology Nurse Navigator Burton, Lacie K, NP as Nurse Practitioner (Nurse Practitioner)  Clinic Day:  06/17/2024  Referring physician: Cristopher Suzen HERO, NP  ASSESSMENT & PLAN:   Assessment & Plan: Malignant neoplasm of upper-inner quadrant of left breast in female, estrogen receptor positive (HCC) IDC, Stage IIB, c(T1c, N1), yp(T0, N1) ER 30% weak+, PR-/HER2-, Grade 3  -diagnosed in 10/2021 -s/p neoadjuvant chemo ACx4, followed by weekly taxol  01/22/22 - 03/20/22, discontinued due to hospitalization for massive PE and cardiogenic shock.  -she took anastrozole  04/17/22 - 06/22/22 due to delay in surgery from hospitalization, discontinued due to arthralgias. -s/p left lumpectomy on 07/10/22 under Dr. Vanderbilt, path showed complete pathologic response in breast but 1/4 positive lymph nodes. Repeat prognostic panel on the positive node showed ER 20% weak, PR 2% moderate, and Her2 negative. -she completed concurrent chemoRT with Xeloda  on 10/02/2022 -She completed adjuvant Xeloda  for 6 months in April 2024. -She has started to exemestane  in May 2024, tolerating well overall.  Will continue for 5 years. -3D diagnostic mammogram 01/16/2024 was benign with category B breast density.  Repeat expected in - DEXA scan on 05/27/2024 was normal. -Continue exemestane  daily. -Labs and follow-up in 6 months, sooner if needed.   Mild anemia Hemoglobin is 11.5 and hematocrit 33.3.  Recommend addition of OTC, slow release iron supplement daily.  Advise she take with orange juice to help with absorption.  Recheck labs in 6 months, sooner if needed.  Plan Labs reviewed. - Mild anemia. - Unremarkable  CMP. Benign breast exam. Tolerating exemestane  without side effects. Continue exemestane  daily. Plan for labs and follow-up in 6 months, sooner if needed.  The patient understands the plans discussed today and is in agreement with them.  She knows to contact our office if she develops concerns prior to her next appointment.  I provided 25 minutes of face-to-face time during this encounter and > 50% was spent counseling as documented under my assessment and plan.    Powell FORBES Lessen, NP  [facility] - A DEPT OF [facility] [address] [place] [address] Dept: [phone] Dept Fax: [phone]   No orders of the defined types were placed in this encounter.     CHIEF COMPLAINT:  CC: Left breast cancer, ER  +  Current Treatment: Exemestane  daily  INTERVAL HISTORY:  Cassandra Brennan is here today for repeat clinical assessment.  She last saw me on 12/19/2023.  She had 3D diagnostic mammogram on 01/16/2024.  This was benign.  Breast category B.  Repeat diagnostic mammogram recommended in March 2026.  A DEXA scan was done 05/26/2024.  Overall, results were normal.  Repeat expected in August 2027.  She is tolerating exemestane  well.  Denies hot flashes or night sweats.  She has no unusual joint pain.  She has noticed no changes in either breast.  She denies chest pain, chest pressure, or shortness of breath. She denies headaches or visual disturbances. She denies abdominal pain, nausea, vomiting, or changes in bowel or bladder habits.   She denies fevers or chills. She denies pain. Her appetite is good. Her weight has been stable.  I have reviewed the past  medical history, past surgical history, social history and family history with the patient and they are unchanged from previous note.  ALLERGIES:  is allergic to prednisone, rosuvastatin, guaifenesin, sulfa antibiotics, and other.  MEDICATIONS:  Current Outpatient Medications   Medication Sig Dispense Refill   acetaminophen  (TYLENOL ) 500 MG tablet Take 500 mg by mouth every 8 (eight) hours as needed for moderate pain.     cephALEXin  (KEFLEX ) 500 MG capsule Take 1 capsule (500 mg total) by mouth 4 (four) times daily. 20 capsule 0   exemestane  (AROMASIN ) 25 MG tablet TAKE 1 TABLET EVERY DAY 90 tablet 3   ezetimibe (ZETIA) 10 MG tablet Take 10 mg by mouth daily.     lidocaine -prilocaine  (EMLA ) cream Apply to affected area once 30 g 3   LISINOPRIL PO Take 1 tablet by mouth daily.     metoprolol  succinate (TOPROL -XL) 100 MG 24 hr tablet Take 100 mg by mouth daily. Take with or immediately following a meal.     Multiple Vitamins-Minerals (MULTIVITAMIN ADULTS) TABS Take 1 tablet by mouth daily.     urea  (CARMOL) 10 % cream Apply topically as needed. 71 g 0   No current facility-administered medications for this visit.    HISTORY OF PRESENT ILLNESS:   Oncology History Overview Note   Cancer Staging  Malignant neoplasm of upper-inner quadrant of left breast in female, estrogen receptor positive (HCC) Staging form: Breast, AJCC 8th Edition - Clinical stage from 10/27/2021: Stage IIB (cT1c, cN1, cM0, G3, ER+, PR-, HER2-) - Signed by Burton, Lacie K, NP on 11/28/2021 - Pathologic stage from 07/10/2022: No Stage Recommended (ypT0, pN1, cM0, G3, ER+, PR-, HER2-) - Unsigned     Malignant neoplasm of upper-inner quadrant of left breast in female, estrogen receptor positive (HCC)  10/19/2021 Mammogram   CLINICAL DATA:  Patient presents for palpable left axillary abnormality.   EXAM: DIGITAL DIAGNOSTIC BILATERAL MAMMOGRAM WITH TOMOSYNTHESIS AND CAD; ULTRASOUND LEFT BREAST LIMITED  IMPRESSION: Suspicious left breast mass 10 o'clock position.   Adjacent suspicious satellite nodule left breast 12 o'clock position.   Palpable mass left axilla corresponds with a large lymph node.   10/27/2021 Initial Biopsy   Diagnosis 1. Breast, left, needle core biopsy, 12 o'clock,  ribbon - FIBROADENOMA - NO MALIGNANCY IDENTIFIED 2. Breast, left, needle core biopsy, 10 o'clock, coil - INVASIVE DUCTAL CARCINOMA WITH NECROSIS - DUCTAL CARCINOMA IN SITU - SEE COMMENT 3. Lymph node, needle/core biopsy, left axilla, tribell - INVASIVE DUCTAL CARCINOMA WITH NECROSIS - NO NODAL TISSUE IDENTIFIED - SEE COMMENT Microscopic Comment 2. and 2. Based on the biopsy, the carcinoma appears Nottingham grade 3 of 3 and measures 0.8 cm in greatest linear extent.  3. PROGNOSTIC INDICATORS Results: The tumor cells are NEGATIVE for Her2 (1+). Estrogen Receptor: 30%, POSITIVE, WEAK STAINING INTENSITY Progesterone Receptor: 0%, NEGATIVE Proliferation Marker Ki67: 60%   10/27/2021 Cancer Staging   Staging form: Breast, AJCC 8th Edition - Clinical stage from 10/27/2021: Stage IIB (cT1c, cN1, cM0, G3, ER+, PR-, HER2-) - Signed by Burton, Lacie K, NP on 11/28/2021 Stage prefix: Initial diagnosis Histologic grading system: 3 grade system   11/06/2021 Initial Diagnosis   Malignant neoplasm of upper-inner quadrant of left breast in female, estrogen receptor positive (HCC)   11/15/2021 Breast MRI   IMPRESSION: 1.9 cm mass in the upper-inner quadrant of the left breast corresponding with the known invasive ductal carcinoma. Enlarged left axillary lymph node corresponding with known metastatic disease.   RECOMMENDATION: Treatment planning of the known left  breast cancer and axillary metastasis is recommended.   Additional imaging evaluation of the mass in the liver is recommended with CT with liver mass protocol or MRI.   11/22/2021 Imaging   Bone scan IMPRESSION: No scintigraphic evidence of bony metastatic disease. Degenerative changes as above.   11/22/2021 Echocardiogram   Baseline echo LVEF 55-60%, normal GLS -21.7%   11/23/2021 Imaging   CT CAP IMPRESSION: 1. Bulky lymphadenopathy in the left axilla including a 5.0 x 2.8 cm lymph node. 2. 15 mm nodule identified in the upper inner  quadrant of the left breast. 3. No suspicious pulmonary nodule or mass. No evidence for metastatic disease in the abdomen or pelvis. 4. 11 mm cyst in the dome of the left liver. Adjacent tiny hypoattenuating lesions in the right liver are too small to characterize, but likely benign. Attention on follow-up recommended. 5. Prominent stool volume raises the question of clinical constipation.   11/28/2021 - 03/20/2022 Chemotherapy   Patient is on Treatment Plan : BREAST ADJUVANT DOSE DENSE AC q14d / PACLitaxel  q7d      Genetic Testing   [facility] was Negative. Report date is 11/26/2021.   The CancerNext gene panel offered by [employer] includes sequencing, rearrangement analysis, and RNA analysis for the following 36 genes:   APC, ATM, AXIN2, BARD1, BMPR1A, BRCA1, BRCA2, BRIP1, CDH1, CDK4, CDKN2A, CHEK2, DICER1, HOXB13, EPCAM, GREM1, MLH1, MSH2, MSH3, MSH6, MUTYH, NBN, NF1, NTHL1, PALB2, PMS2, POLD1, POLE, PTEN, RAD51C, RAD51D, RECQL, SMAD4, SMARCA4, STK11, and TP53.    07/10/2022 Definitive Surgery   FINAL MICROSCOPIC DIAGNOSIS:   A. BREAST, LEFT, LUMPECTOMY:  - No residual carcinoma identified  - Treatment effect present (see comment)  - Ductal carcinoma in situ present, ypTis  - Margins negative for ductal carcinoma in situ  - See oncology table   B. BREAST, LEFT ADDITIONAL MEDIAL MARGIN, EXCISION:  - Negative for carcinoma   C. BREAST, LEFT ADDITIONAL LATERAL MARGIN, EXCISION:  - Negative for carcinoma   D. LYMPH NODE, LEFT AXILLARY, SENTINEL, EXCISION:  - Positive for carcinoma (1/1)   E. LYMPH NODE, LEFT AXILLARY, SENTINEL, EXCISION:  - Negative for carcinoma (0/1)   F. LYMPH NODE, LEFT, AXILLARY CONTENTS:  - Two lymph nodes identified  - Negative for carcinoma (0/2)    ADDENDUM:  Part D: Left axillary sentinel lymph node,   PROGNOSTIC INDICATOR RESULTS:  The tumor cells are NEGATIVE for Her2 (0).  Estrogen Receptor:       POSITIVE, 20%, WEAK STAINING   Progesterone Receptor:   POSITIVE, 2%, MODERATE STAINING    08/15/2022 - 10/02/2022 Radiation Therapy   Radiation Treatment Dates: 08/15/2022 through 10/02/2022 Site Technique Total Dose (Gy) Dose per Fx (Gy) Completed Fx Beam Energies  Breast, Left: Breast_L 3D 50.4/50.4 1.8 28/28 10X  Breast, Left: Breast_L_SCV_PAB 3D 50.4/50.4 1.8 28/28 6X, 15X  Breast, Left: Breast_L_Bst 3D 10/10 2 5/5 6X, 10X    Took adjuvant Xeloda  concurrent with radiation then continued Xeloda  until 01/2023   03/19/2023 Survivorship   SCP delivered by Lacie Burton, NP   02/2023 -  Anti-estrogen oral therapy   Began Exemestane        REVIEW OF SYSTEMS:   Constitutional: Denies fevers, chills or abnormal weight loss Eyes: Denies blurriness of vision Ears, nose, mouth, throat, and face: Denies mucositis or sore throat Respiratory: Denies cough, dyspnea or wheezes Cardiovascular: Denies palpitation, chest discomfort or lower extremity swelling Gastrointestinal:  Denies nausea, heartburn or change in bowel habits Skin: Denies abnormal skin rashes Lymphatics:  Denies new lymphadenopathy or easy bruising Neurological:Denies numbness, tingling or new weaknesses Behavioral/Psych: Mood is stable, no new changes  All other systems were reviewed with the patient and are negative.   VITALS:   Today's Vitals   06/17/24 0912 06/17/24 0919  BP: 138/80   Pulse: (!) 101   Resp: 17   Temp: 97.8 F (36.6 C)   SpO2: 99%   Weight: 187 lb 11.2 oz (85.1 kg)   PainSc:  0-No pain   Body mass index is 31.23 kg/m.    Wt Readings from Last 3 Encounters:  06/17/24 187 lb 11.2 oz (85.1 kg)  05/04/24 185 lb 4 oz (84 kg)  12/19/23 187 lb 8 oz (85 kg)    Body mass index is 31.23 kg/m.  Performance status (ECOG): 0 - Asymptomatic  PHYSICAL EXAM:   GENERAL:alert, no distress and comfortable SKIN: skin color, texture, turgor are normal, no rashes or significant lesions EYES: normal, Conjunctiva are pink and  non-injected, sclera clear OROPHARYNX:no exudate, no erythema and lips, buccal mucosa, and tongue normal  NECK: supple, thyroid  normal size, non-tender, without nodularity LYMPH:  no palpable lymphadenopathy in the cervical, axillary or inguinal LUNGS: clear to auscultation and percussion with normal breathing effort HEART: regular rate & rhythm and no murmurs and no lower extremity edema ABDOMEN:abdomen soft, non-tender and normal bowel sounds Musculoskeletal:no cyanosis of digits and no clubbing  NEURO: alert & oriented x 3 with fluent speech, no focal motor/sensory deficits BREAST: Well-healed lumpectomy scar on the left breast.  There are no palpable lumps or masses.  There is no nipple inversion or nipple discharge.  There is no axillary lymphadenopathy on the left.  There are no palpable lumps or masses in the right breast.  There is no nipple inversion or nipple discharge.  There is no axillary lymphadenopathy on the right.  LABORATORY DATA:  I have reviewed the data as listed    Component Value Date/Time   NA 141 06/17/2024 0853   K 3.3 (L) 06/17/2024 0853   CL 105 06/17/2024 0853   CO2 29 06/17/2024 0853   GLUCOSE 117 (H) 06/17/2024 0853   BUN 22 06/17/2024 0853   CREATININE 1.11 (H) 06/17/2024 0853   CREATININE 1.06 (H) 03/15/2020 0350   CALCIUM 9.6 06/17/2024 0853   PROT 7.3 06/17/2024 0853   ALBUMIN 4.4 06/17/2024 0853   AST 16 06/17/2024 0853   ALT 19 06/17/2024 0853   ALKPHOS 102 06/17/2024 0853   BILITOT 0.4 06/17/2024 0853   GFRNONAA 54 (L) 06/17/2024 0853   GFRNONAA 56 (L) 03/15/2020 0350   GFRAA 65 03/15/2020 0350    Lab Results  Component Value Date   WBC 6.5 06/17/2024   NEUTROABS 4.3 06/17/2024   HGB 11.5 (L) 06/17/2024   HCT 33.3 (L) 06/17/2024   MCV 86.7 06/17/2024   PLT 260 06/17/2024     RADIOGRAPHIC STUDIES: DG Bone Density Result Date: 05/27/2024 EXAM: DUAL X-RAY ABSORPTIOMETRY (DXA) FOR BONE MINERAL DENSITY 05/26/2024 9:43 am CLINICAL DATA:  68  year old Female Postmenopausal. Estrogen deficiency. history of breast cancer and taking exemestane  TECHNIQUE: An axial (e.g., hips, spine) and/or appendicular (e.g., radius) exam was performed, as appropriate, using GE Psychologist, sport and exercise at [facility]. Images are obtained for bone mineral density measurement and are not obtained for diagnostic purposes. [id number] Exclusions: None. COMPARISON:  None. FINDINGS: Scan quality: Good. LUMBAR SPINE (L1-L4): BMD (in g/cm2): 1.145 T-score: -0.3 Z-score: 0.6 LEFT FEMORAL NECK: BMD (in g/cm2): 1.127 T-score: 0.6  Z-score: 1.3 LEFT TOTAL HIP: BMD (in g/cm2): 1.054 T-score: 0.4 Z-score: 0.7 RIGHT FEMORAL NECK: BMD (in g/cm2): 1.043 T-score: 0.0 Z-score: 0.7 RIGHT TOTAL HIP: BMD (in g/cm2): 0.997 T-score: -0.1 Z-score: 0.2 FRAX 10-YEAR PROBABILITY OF FRACTURE: FRAX not reported as the lowest BMD is not in the osteopenia range. IMPRESSION: Normal based on BMD. Fracture risk is not increased. RECOMMENDATIONS: 1. All patients should optimize calcium and vitamin D intake. 2. Consider FDA-approved medical therapies in postmenopausal women and men aged 64 years and older, based on the following: - A hip or vertebral (clinical or morphometric) fracture - T-score less than or equal to -2.5 and secondary causes have been excluded. - Low bone mass (T-score between -1.0 and -2.5) and a 10-year probability of a hip fracture greater than or equal to 3% or a 10-year probability of a major osteoporosis-related fracture greater than or equal to 20% based on the [place] -adapted WHO algorithm. - Clinician judgment and/or patient preferences may indicate treatment for people with 10-year fracture probabilities above or below these levels 3. Patients with diagnosis of osteoporosis or at high risk for fracture should have regular bone mineral density tests. For patients eligible for Medicare, routine testing is allowed once every 2 years. The testing frequency can be increased to one  year for patients who have rapidly progressing disease, those who are receiving or discontinuing medical therapy to restore bone mass, or have additional risk factors. Electronically Signed   By: Reyes Phi M.D.   On: 05/27/2024 14:50

## 2024-06-17 ENCOUNTER — Inpatient Hospital Stay: Payer: Medicare PPO | Admitting: Nurse Practitioner

## 2024-06-17 ENCOUNTER — Inpatient Hospital Stay: Payer: Medicare PPO | Attending: Nurse Practitioner

## 2024-06-17 VITALS — BP 138/80 | HR 101 | Temp 97.8°F | Resp 17 | Wt 187.7 lb

## 2024-06-17 DIAGNOSIS — Z1732 Human epidermal growth factor receptor 2 negative status: Secondary | ICD-10-CM | POA: Insufficient documentation

## 2024-06-17 DIAGNOSIS — Z170 Estrogen receptor positive status [ER+]: Secondary | ICD-10-CM

## 2024-06-17 DIAGNOSIS — C50212 Malignant neoplasm of upper-inner quadrant of left female breast: Secondary | ICD-10-CM | POA: Insufficient documentation

## 2024-06-17 DIAGNOSIS — D649 Anemia, unspecified: Secondary | ICD-10-CM | POA: Diagnosis not present

## 2024-06-17 DIAGNOSIS — Z79811 Long term (current) use of aromatase inhibitors: Secondary | ICD-10-CM | POA: Insufficient documentation

## 2024-06-17 DIAGNOSIS — Z86711 Personal history of pulmonary embolism: Secondary | ICD-10-CM | POA: Diagnosis not present

## 2024-06-17 DIAGNOSIS — Z882 Allergy status to sulfonamides status: Secondary | ICD-10-CM | POA: Insufficient documentation

## 2024-06-17 DIAGNOSIS — Z79899 Other long term (current) drug therapy: Secondary | ICD-10-CM | POA: Diagnosis not present

## 2024-06-17 DIAGNOSIS — Z9221 Personal history of antineoplastic chemotherapy: Secondary | ICD-10-CM | POA: Diagnosis not present

## 2024-06-17 DIAGNOSIS — Z1722 Progesterone receptor negative status: Secondary | ICD-10-CM | POA: Insufficient documentation

## 2024-06-17 DIAGNOSIS — D249 Benign neoplasm of unspecified breast: Secondary | ICD-10-CM | POA: Insufficient documentation

## 2024-06-17 DIAGNOSIS — Z888 Allergy status to other drugs, medicaments and biological substances status: Secondary | ICD-10-CM | POA: Insufficient documentation

## 2024-06-17 LAB — CBC WITH DIFFERENTIAL (CANCER CENTER ONLY)
Abs Immature Granulocytes: 0.02 K/uL (ref 0.00–0.07)
Basophils Absolute: 0 K/uL (ref 0.0–0.1)
Basophils Relative: 0 %
Eosinophils Absolute: 0.4 K/uL (ref 0.0–0.5)
Eosinophils Relative: 6 %
HCT: 33.3 % — ABNORMAL LOW (ref 36.0–46.0)
Hemoglobin: 11.5 g/dL — ABNORMAL LOW (ref 12.0–15.0)
Immature Granulocytes: 0 %
Lymphocytes Relative: 21 %
Lymphs Abs: 1.4 K/uL (ref 0.7–4.0)
MCH: 29.9 pg (ref 26.0–34.0)
MCHC: 34.5 g/dL (ref 30.0–36.0)
MCV: 86.7 fL (ref 80.0–100.0)
Monocytes Absolute: 0.3 K/uL (ref 0.1–1.0)
Monocytes Relative: 5 %
Neutro Abs: 4.3 K/uL (ref 1.7–7.7)
Neutrophils Relative %: 68 %
Platelet Count: 260 K/uL (ref 150–400)
RBC: 3.84 MIL/uL — ABNORMAL LOW (ref 3.87–5.11)
RDW: 14.7 % (ref 11.5–15.5)
WBC Count: 6.5 K/uL (ref 4.0–10.5)
nRBC: 0 % (ref 0.0–0.2)

## 2024-06-17 LAB — CMP (CANCER CENTER ONLY)
ALT: 19 U/L (ref 0–44)
AST: 16 U/L (ref 15–41)
Albumin: 4.4 g/dL (ref 3.5–5.0)
Alkaline Phosphatase: 102 U/L (ref 38–126)
Anion gap: 7 (ref 5–15)
BUN: 22 mg/dL (ref 8–23)
CO2: 29 mmol/L (ref 22–32)
Calcium: 9.6 mg/dL (ref 8.9–10.3)
Chloride: 105 mmol/L (ref 98–111)
Creatinine: 1.11 mg/dL — ABNORMAL HIGH (ref 0.44–1.00)
GFR, Estimated: 54 mL/min — ABNORMAL LOW (ref >60)
Glucose, Bld: 117 mg/dL — ABNORMAL HIGH (ref 70–99)
Potassium: 3.3 mmol/L — ABNORMAL LOW (ref 3.5–5.1)
Sodium: 141 mmol/L (ref 135–145)
Total Bilirubin: 0.4 mg/dL (ref 0.0–1.2)
Total Protein: 7.3 g/dL (ref 6.5–8.1)

## 2024-06-17 LAB — SAMPLE TO BLOOD BANK

## 2024-06-20 ENCOUNTER — Encounter: Payer: Self-pay | Admitting: Hematology

## 2024-06-20 ENCOUNTER — Encounter: Payer: Self-pay | Admitting: Nurse Practitioner

## 2024-08-14 ENCOUNTER — Other Ambulatory Visit: Payer: Medicare PPO

## 2024-10-03 ENCOUNTER — Encounter: Payer: Self-pay | Admitting: Hematology

## 2024-10-26 ENCOUNTER — Encounter: Payer: Self-pay | Admitting: Hematology

## 2024-11-02 ENCOUNTER — Ambulatory Visit: Attending: Surgery

## 2024-11-02 VITALS — Wt 181.1 lb

## 2024-11-02 DIAGNOSIS — Z483 Aftercare following surgery for neoplasm: Secondary | ICD-10-CM | POA: Insufficient documentation

## 2024-11-02 NOTE — Therapy (Signed)
 " OUTPATIENT PHYSICAL THERAPY SOZO SCREENING NOTE   Patient Name: [facility] MRN: [id number] DOB:05/08/1956, 69 y.o., female Today's Date: 11/02/2024  PCP: Cristopher Suzen CHRISTELLA, NP REFERRING PROVIDER: Vanderbilt Ned, MD   PT End of Session - 11/02/24 1049     Visit Number 11   # unchanged due to screen only   PT Start Time 1047    PT Stop Time 1051    PT Time Calculation (min) 4 min    Activity Tolerance Patient tolerated treatment well    Behavior During Therapy WFL for tasks assessed/performed          Past Medical History:  Diagnosis Date   Acute dyspnea    Acute metabolic encephalopathy    Anxiety    Arthritis    right knee    Cataract    Elevated troponin    Femoral DVT (deep venous thrombosis) (HCC)    GERD (gastroesophageal reflux disease)    History of radiation therapy    Left breast 08/15/22-10/02/22- Dr. Lynwood Nasuti   History of radiation therapy    Left breast- 08/15/22-10/02/22- Dr. Lynwood Nasuti   Hyperlipidemia    Hypertension    Lactic acidosis    Obstructive cardiovascular shock (HCC) 03/22/2022   Pneumonia    Positive colorectal cancer screening using Cologuard test    Pulmonary embolism (HCC)    03/22/22   Past Surgical History:  Procedure Laterality Date   BREAST CYST EXCISION Right    pt stated in 1980   BREAST LUMPECTOMY WITH RADIOACTIVE SEED AND SENTINEL LYMPH NODE BIOPSY Left 07/10/2022   Procedure: LEFT BREAST LUMPECTOMY WITH RADIOACTIVE SEED AND LEFT  SENTINEL LYMPH NODE MAPPING;  Surgeon: Vanderbilt Ned, MD;  Location: [facility];  Service: General;  Laterality: Left;   BREAST SURGERY     other GI testing     unsure but had to drink a chalky drink    PORTACATH PLACEMENT N/A 11/27/2021   Procedure: INSERTION PORT-A-CATH;  Surgeon: Vanderbilt Ned, MD;  Location: [facility];  Service: General;  Laterality: N/A;   RADIOACTIVE SEED GUIDED AXILLARY SENTINEL LYMPH NODE Left 07/10/2022   Procedure: LEFT AXILLARY SEED LYMPH NODE  BIOPSY;  Surgeon: Vanderbilt Ned, MD;  Location: [facility];  Service: General;  Laterality: Left;   TONSILLECTOMY     TUBAL LIGATION     Patient Active Problem List   Diagnosis Date Noted   Right leg DVT (HCC) 03/25/2022   Pneumonia 03/23/2022   AKI (acute kidney injury) 03/23/2022   history of massive pulmonary embolism (HCC) 03/22/2022   GERD (gastroesophageal reflux disease)    Chronic anemia    Cardiogenic shock (HCC)    Anxiety 12/08/2021   Genetic testing 11/30/2021   Port-A-Cath in place 11/28/2021   Malignant neoplasm of upper-inner quadrant of left breast in female, estrogen receptor positive (HCC) 11/06/2021   Hypertension 03/15/2020    REFERRING DIAG: left breast cancer at risk for lymphedema  THERAPY DIAG: Aftercare following surgery for neoplasm  PERTINENT HISTORY: Patient was diagnosed on 10/19/2022 with left grade III invasive ductal carcinoma breast cancer. It measures 2 cm and is located in the upper inner quadrant. It is weakly ER positive, PR negative and HER2 negative with a Ki67 of 60%. L breast lumpectomy and SLNB on 07/10/22 (1/4), had blood clots in her lung   PRECAUTIONS: left UE Lymphedema risk, None  SUBJECTIVE: Pt returns for her 6 month L-Dex screen.   PAIN:  Are you having pain?  No  SOZO SCREENING: Patient was assessed today using the SOZO machine to determine the lymphedema index score. This was compared to her baseline score. It was determined that she is within the recommended range when compared to her baseline and no further action is needed at this time. She will continue SOZO screenings. These are done every 3 months for 2 years post operatively followed by every 6 months for 2 years, and then annually.   L-DEX FLOWSHEETS - 11/02/24 1000       L-DEX LYMPHEDEMA SCREENING   Measurement Type Unilateral    L-DEX MEASUREMENT EXTREMITY Upper Extremity    POSITION  Standing    DOMINANT SIDE Right    At Risk Side Left     BASELINE SCORE (UNILATERAL) 9.6    L-DEX SCORE (UNILATERAL) 4.4    VALUE CHANGE (UNILAT) -5.2         P: Pt to cont every 6 month screens until 06/2026.    Aden Berwyn Caldron, PTA 11/02/2024, 10:50 AM    "

## 2024-12-18 ENCOUNTER — Inpatient Hospital Stay

## 2024-12-18 ENCOUNTER — Inpatient Hospital Stay: Admitting: Nurse Practitioner

## 2025-05-03 ENCOUNTER — Ambulatory Visit
